# Patient Record
Sex: Male | Born: 1940 | Race: White | Hispanic: No | Marital: Married | Smoking: Former smoker
Health system: Southern US, Community
[De-identification: ages and names within clinical notes are randomized; demographics above are authoritative.]

## PROBLEM LIST (undated history)

## (undated) DIAGNOSIS — K08109 Complete loss of teeth, unspecified cause, unspecified class: Secondary | ICD-10-CM

## (undated) DIAGNOSIS — E785 Hyperlipidemia, unspecified: Secondary | ICD-10-CM

## (undated) DIAGNOSIS — Z972 Presence of dental prosthetic device (complete) (partial): Secondary | ICD-10-CM

## (undated) DIAGNOSIS — T8859XA Other complications of anesthesia, initial encounter: Secondary | ICD-10-CM

## (undated) DIAGNOSIS — R918 Other nonspecific abnormal finding of lung field: Principal | ICD-10-CM

## (undated) DIAGNOSIS — E119 Type 2 diabetes mellitus without complications: Secondary | ICD-10-CM

## (undated) DIAGNOSIS — T4145XA Adverse effect of unspecified anesthetic, initial encounter: Secondary | ICD-10-CM

## (undated) DIAGNOSIS — H9190 Unspecified hearing loss, unspecified ear: Secondary | ICD-10-CM

## (undated) DIAGNOSIS — C801 Malignant (primary) neoplasm, unspecified: Secondary | ICD-10-CM

## (undated) DIAGNOSIS — K219 Gastro-esophageal reflux disease without esophagitis: Secondary | ICD-10-CM

## (undated) DIAGNOSIS — Z923 Personal history of irradiation: Secondary | ICD-10-CM

## (undated) DIAGNOSIS — R0683 Snoring: Secondary | ICD-10-CM

## (undated) DIAGNOSIS — L4050 Arthropathic psoriasis, unspecified: Secondary | ICD-10-CM

## (undated) DIAGNOSIS — Z973 Presence of spectacles and contact lenses: Secondary | ICD-10-CM

## (undated) DIAGNOSIS — R1310 Dysphagia, unspecified: Secondary | ICD-10-CM

## (undated) DIAGNOSIS — M1990 Unspecified osteoarthritis, unspecified site: Secondary | ICD-10-CM

## (undated) HISTORY — DX: Personal history of irradiation: Z92.3

## (undated) HISTORY — DX: Hyperlipidemia, unspecified: E78.5

## (undated) HISTORY — DX: Dysphagia, unspecified: R13.10

## (undated) HISTORY — PX: COLONOSCOPY: SHX174

## (undated) HISTORY — DX: Other nonspecific abnormal finding of lung field: R91.8

---

## 2004-08-30 ENCOUNTER — Ambulatory Visit: Payer: Self-pay | Admitting: Family Medicine

## 2004-11-14 ENCOUNTER — Emergency Department (HOSPITAL_COMMUNITY): Admission: EM | Admit: 2004-11-14 | Discharge: 2004-11-14 | Payer: Self-pay | Admitting: Emergency Medicine

## 2004-11-14 ENCOUNTER — Ambulatory Visit: Payer: Self-pay | Admitting: Family Medicine

## 2005-01-04 ENCOUNTER — Emergency Department (HOSPITAL_COMMUNITY): Admission: EM | Admit: 2005-01-04 | Discharge: 2005-01-04 | Payer: Self-pay | Admitting: Emergency Medicine

## 2005-01-05 ENCOUNTER — Ambulatory Visit: Payer: Self-pay | Admitting: Family Medicine

## 2008-04-09 ENCOUNTER — Emergency Department (HOSPITAL_COMMUNITY): Admission: EM | Admit: 2008-04-09 | Discharge: 2008-04-09 | Payer: Self-pay | Admitting: Emergency Medicine

## 2010-03-31 ENCOUNTER — Emergency Department (HOSPITAL_COMMUNITY)
Admission: EM | Admit: 2010-03-31 | Discharge: 2010-03-31 | Payer: Self-pay | Source: Home / Self Care | Admitting: Emergency Medicine

## 2011-02-03 LAB — DIFFERENTIAL
Basophils Relative: 0 % (ref 0–1)
Eosinophils Relative: 0 % (ref 0–5)
Lymphs Abs: 0.9 10*3/uL (ref 0.7–4.0)
Monocytes Relative: 9 % (ref 3–12)
Neutrophils Relative %: 85 % — ABNORMAL HIGH (ref 43–77)

## 2011-02-03 LAB — CBC
Hemoglobin: 15 g/dL (ref 13.0–17.0)
MCHC: 33.5 g/dL (ref 30.0–36.0)
Platelets: 280 10*3/uL (ref 150–400)
RBC: 4.82 MIL/uL (ref 4.22–5.81)
RDW: 12.8 % (ref 11.5–15.5)

## 2011-02-03 LAB — COMPREHENSIVE METABOLIC PANEL
AST: 20 U/L (ref 0–37)
Albumin: 3.7 g/dL (ref 3.5–5.2)
CO2: 25 mEq/L (ref 19–32)
GFR calc Af Amer: >60 mL/min (ref >60)
Glucose, Bld: 128 mg/dL — ABNORMAL HIGH (ref 70–99)
Potassium: 4.4 mEq/L (ref 3.5–5.1)
Sodium: 138 mEq/L (ref 135–145)
Total Bilirubin: 0.6 mg/dL (ref 0.3–1.2)

## 2011-02-03 LAB — URINALYSIS, ROUTINE W REFLEX MICROSCOPIC
Bilirubin Urine: NEGATIVE
Glucose, UA: NEGATIVE mg/dL
Ketones, ur: NEGATIVE mg/dL
Specific Gravity, Urine: 1.023 (ref 1.005–1.030)

## 2011-02-03 LAB — CK TOTAL AND CKMB (NOT AT ARMC)
CK, MB: 0.9 ng/mL (ref 0.3–4.0)
Total CK: 21 U/L (ref 7–232)

## 2011-07-06 ENCOUNTER — Emergency Department (HOSPITAL_COMMUNITY)
Admission: EM | Admit: 2011-07-06 | Discharge: 2011-07-06 | Disposition: A | Discharge status: Home / Self Care | Payer: Medicare Other | Attending: Emergency Medicine | Admitting: Emergency Medicine

## 2011-07-06 ENCOUNTER — Encounter (HOSPITAL_COMMUNITY): Payer: Self-pay | Admitting: Emergency Medicine

## 2011-07-06 ENCOUNTER — Emergency Department (HOSPITAL_COMMUNITY): Payer: Medicare Other

## 2011-07-06 DIAGNOSIS — M5137 Other intervertebral disc degeneration, lumbosacral region: Secondary | ICD-10-CM | POA: Insufficient documentation

## 2011-07-06 DIAGNOSIS — M51379 Other intervertebral disc degeneration, lumbosacral region without mention of lumbar back pain or lower extremity pain: Secondary | ICD-10-CM | POA: Insufficient documentation

## 2011-07-06 DIAGNOSIS — M5136 Other intervertebral disc degeneration, lumbar region: Secondary | ICD-10-CM

## 2011-07-06 DIAGNOSIS — Z79899 Other long term (current) drug therapy: Secondary | ICD-10-CM | POA: Insufficient documentation

## 2011-07-06 DIAGNOSIS — Z7982 Long term (current) use of aspirin: Secondary | ICD-10-CM | POA: Insufficient documentation

## 2011-07-06 DIAGNOSIS — M545 Low back pain, unspecified: Secondary | ICD-10-CM | POA: Insufficient documentation

## 2011-07-06 HISTORY — DX: Unspecified osteoarthritis, unspecified site: M19.90

## 2011-07-06 LAB — URINE MICROSCOPIC-ADD ON

## 2011-07-06 LAB — URINALYSIS, ROUTINE W REFLEX MICROSCOPIC
Glucose, UA: NEGATIVE mg/dL
Hgb urine dipstick: NEGATIVE
Nitrite: NEGATIVE
Protein, ur: NEGATIVE mg/dL
Specific Gravity, Urine: 1.022 (ref 1.005–1.030)
Urobilinogen, UA: 0.2 mg/dL (ref 0.0–1.0)
pH: 5.5 (ref 5.0–8.0)

## 2011-07-06 MED ORDER — OXYCODONE-ACETAMINOPHEN 5-325 MG PO TABS: 1.0000 | ORAL_TABLET | Freq: Four times a day (QID) | ORAL | Status: AC | PRN

## 2011-07-06 MED ORDER — ONDANSETRON 8 MG PO TBDP
8.0000 mg | ORAL_TABLET | Freq: Once | ORAL | Status: AC
  Administered 2011-07-06: 8 mg via ORAL
  Filled 2011-07-06: qty 1

## 2011-07-06 MED ORDER — MORPHINE SULFATE 4 MG/ML IJ SOLN: 4.0000 mg | Freq: Once | INTRAMUSCULAR | Status: DC

## 2011-07-06 MED ORDER — DIAZEPAM 5 MG PO TABS: 5.0000 mg | ORAL_TABLET | Freq: Three times a day (TID) | ORAL | Status: AC | PRN

## 2011-07-06 MED ORDER — PREDNISONE 50 MG PO TABS: 50.0000 mg | ORAL_TABLET | Freq: Every day | ORAL | Status: AC

## 2011-07-06 MED ORDER — KETOROLAC TROMETHAMINE 60 MG/2ML IM SOLN
60.0000 mg | Freq: Once | INTRAMUSCULAR | Status: AC
  Administered 2011-07-06: 60 mg via INTRAMUSCULAR
  Filled 2011-07-06: qty 2

## 2011-07-06 NOTE — Discharge Instructions (Signed)
Followup with your primary care doctor in the next one to 2 days.  Return here for any worsening in your condition.  Use ice and heat on your lower back.

## 2011-07-06 NOTE — ED Provider Notes (Signed)
History     CSN: [id number]  Arrival date & time 07/06/11  0900   First MD Initiated Contact with Patient 07/06/11 [id number]      No chief complaint on file.   (Consider location/radiation/quality/duration/timing/severity/associated sxs/prior treatment) HPI Patient since the emergency department with low back pain started Sunday morning after taking a shower.  He states that he has had back pain previously in the past.  He states that he has pain with walking but is able to ambulate without any difficulties otherwise.  Patient states that he has taken some oxycodone that he had but did not seem to help totally.  The patient states that he does not have any numbness, weakness, nausea/vomiting, abdominal pain, chest pain, shortness of breath, fever, or urinary symptoms.  States the pain does not necessarily get worse except with movement.  Patient denies the pain is worse in the evening or night.  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 Past Medical History  Diagnosis Date  . Arthritis     History reviewed. No pertinent past surgical history.  No family history on file.  History  Substance Use Topics  . Smoking status: Current Everyday Smoker  . Smokeless tobacco: Not on file  . Alcohol Use: Yes      Review of Systems All pertinent positives and negatives reviewed in the history of present illness  Allergies  Hydrocodone  Home Medications   Current Outpatient Rx  Name Route Sig Dispense Refill  . ASPIRIN EC 81 MG PO TBEC Oral Take 81 mg by mouth daily.    Marland Kitchen CALCIUM CARBONATE 600 MG PO TABS Oral Take 600 mg by mouth 2 (two) times daily with a meal.    . TYLENOL COLD RELIEF NIGHTTIME PO Oral Take 1 tablet by mouth at bedtime.    Marland Kitchen FOLIC ACID 400 MCG PO TABS Oral Take 400 mcg by mouth daily.    Marland Kitchen MAGNESIUM 250 MG PO TABS Oral Take 250 mg by mouth daily.    Marland Kitchen METHOTREXATE SODIUM 2.5 MG PO TABS Oral  Take 15 mg by mouth once a week.    . ADULT MULTIVITAMIN W/MINERALS CH Oral Take 1 tablet by mouth daily.    . OMEGA-3-ACID ETHYL ESTERS 1 G PO CAPS Oral Take 1 g by mouth 2 (two) times daily.    Marland Kitchen PANTOPRAZOLE SODIUM 40 MG PO TBEC Oral Take 40 mg by mouth daily.    Marland Kitchen PREDNISONE 5 MG PO TABS Oral Take 5-10 mg by mouth daily.      BP 124/76  Pulse 80  Temp(Src) 97.9 F (36.6 C) (Oral)  Resp 20  SpO2 100%  Physical Exam  Constitutional: He is oriented to person, place, and time. He appears well-developed and well-nourished. No distress.  HENT:  Head: Normocephalic and atraumatic.  Eyes: Pupils are equal, round, and reactive to light.  Neck: Normal range of motion. Neck supple.  Cardiovascular: Normal rate, regular rhythm and normal heart sounds.  Exam reveals no gallop and no friction rub.   No murmur heard. Pulmonary/Chest: Effort normal and breath sounds normal. No respiratory distress. He has no wheezes. He has no rales.  Abdominal: Soft. Bowel sounds are normal. He exhibits no distension. There is no tenderness.  Neurological: He is alert and oriented to person, place, and time. He has normal strength. He is not disoriented. No sensory deficit. Coordination and gait normal.  Reflex  Scores:      Patellar reflexes are 2+ on the right side and 2+ on the left side.      Achilles reflexes are 2+ on the right side and 2+ on the left side. Skin: Skin is warm and dry. No rash noted.    ED Course  Procedures (including critical care time)   Labs Reviewed  URINALYSIS, ROUTINE W REFLEX MICROSCOPIC   Dg Lumbar Spine Complete  07/06/2011  *RADIOLOGY REPORT*  Clinical Data: 71 year old male with low back pain.  LUMBAR SPINE - COMPLETE 4+ VIEW  Comparison: None.  Findings: Bilateral nephrolithiasis, greater on the right.  Several oblong hyperdensities are probably enteric contents rather than a larger renal stones given their position on the oblique views.  Normal lumbar segmentation. Bone  mineralization is within normal limits.  Straightening of lordosis, otherwise normal vertebral height and alignment.  Moderate to severe disc space loss at L5-S1. Other disc spaces are relatively preserved.  There is degenerative endplate spurring at L4-L5.  No pars fracture.  Mild bilateral L5- L1 facet hypertrophy.  Negative visualized sacrum and SI joints.  IMPRESSION: Chronic severe L5-S1 disc degeneration.  Nephrolithiasis.  Original Report Authenticated By: Harley Hallmark, M.D.    The patient has been seen by the attending Physician.  Patient has been here an extended time to give [place] a urine sample.  Patient states that he is not having bowel or bladder issues and is physically not able to urinate as he does not have to go to the bathroom.  Patient has x-ray findings are showing severe L5-S1 disc degeneration and this would be consistent with the location of his pain.  Patient does have a primary care doctor and advised him he will need to followup in recheck with the next few days.  Patient does not have any neurological deficits or abnormal reflexes.  The patient is able to ambulate down the hall and he is not showing any signs of gait disturbance.    MDM  MDM Reviewed: vitals and nursing note Interpretation: x-ray            Carlyle Dolly, PA-C 07/07/11 [phone]

## 2011-07-06 NOTE — ED Notes (Signed)
Pt with hx of back problems but it has been 25 years since last episode. Pt reports pain started Sunday morning after getting out of bathroom. Pt reports pain lower/center right. Denies radiation to right leg or foot or bowel or bladder problems. Pt also denies fall or injury.

## 2011-07-06 NOTE — ED Notes (Signed)
Patient transported to X-ray 

## 2011-07-07 NOTE — ED Provider Notes (Signed)
Medical screening examination/treatment/procedure(s) were performed by non-physician practitioner and as supervising physician I was immediately available for consultation/collaboration.    Nelia Shi, MD 07/07/11 [phone]

## 2011-07-08 LAB — URINE CULTURE
Colony Count: NO GROWTH
Culture  Setup Time: 201303080340
Culture: NO GROWTH

## 2011-07-11 ENCOUNTER — Other Ambulatory Visit (HOSPITAL_COMMUNITY): Payer: Self-pay | Admitting: Family Medicine

## 2011-07-11 ENCOUNTER — Ambulatory Visit (HOSPITAL_COMMUNITY)
Admission: RE | Admit: 2011-07-11 | Discharge: 2011-07-11 | Disposition: A | Discharge status: Home / Self Care | Payer: Medicare Other | Source: Ambulatory Visit | Attending: Family Medicine | Admitting: Family Medicine

## 2011-07-11 DIAGNOSIS — M545 Low back pain, unspecified: Secondary | ICD-10-CM | POA: Insufficient documentation

## 2011-07-11 DIAGNOSIS — M549 Dorsalgia, unspecified: Secondary | ICD-10-CM

## 2011-07-11 DIAGNOSIS — M5126 Other intervertebral disc displacement, lumbar region: Secondary | ICD-10-CM | POA: Insufficient documentation

## 2011-07-11 DIAGNOSIS — Q762 Congenital spondylolisthesis: Secondary | ICD-10-CM | POA: Insufficient documentation

## 2011-07-11 DIAGNOSIS — M5416 Radiculopathy, lumbar region: Secondary | ICD-10-CM

## 2011-07-11 DIAGNOSIS — M47817 Spondylosis without myelopathy or radiculopathy, lumbosacral region: Secondary | ICD-10-CM | POA: Insufficient documentation

## 2011-07-11 DIAGNOSIS — IMO0002 Reserved for concepts with insufficient information to code with codable children: Secondary | ICD-10-CM | POA: Insufficient documentation

## 2011-07-13 ENCOUNTER — Inpatient Hospital Stay (HOSPITAL_COMMUNITY): Admission: RE | Admit: 2011-07-13 | Payer: Medicare Other | Source: Ambulatory Visit

## 2011-11-13 ENCOUNTER — Telehealth (INDEPENDENT_AMBULATORY_CARE_PROVIDER_SITE_OTHER): Payer: Self-pay

## 2011-11-13 NOTE — Telephone Encounter (Signed)
Patient mother Steva Ready) called in to report that Mark Howell has had an increase in abdominal pain and swelling in his scrotum for the last 2 day's.  Mother reports Mark Howell abdominal pain is severe at times, patient able to void without pain, denies any fever or nausea/vomiting.  Due to patient history of Diverticulitis with Perforation of his Colon, Mark Howell has been advised to go to the emergency room for further evaluation.

## 2011-12-20 ENCOUNTER — Ambulatory Visit (INDEPENDENT_AMBULATORY_CARE_PROVIDER_SITE_OTHER): Payer: Medicare Other | Admitting: General Surgery

## 2011-12-20 ENCOUNTER — Encounter (INDEPENDENT_AMBULATORY_CARE_PROVIDER_SITE_OTHER): Payer: Self-pay | Admitting: General Surgery

## 2011-12-20 VITALS — BP 158/72 | HR 72 | Temp 97.3°F | Resp 16 | Ht 73.5 in | Wt 181.2 lb

## 2011-12-20 DIAGNOSIS — K4090 Unilateral inguinal hernia, without obstruction or gangrene, not specified as recurrent: Secondary | ICD-10-CM

## 2011-12-20 MED ORDER — TAMSULOSIN HCL 0.4 MG PO CAPS: 0.4000 mg | ORAL_CAPSULE | Freq: Every day | ORAL | Status: DC

## 2011-12-20 NOTE — Patient Instructions (Addendum)
Pick-up your Flomax prescription at your pharmacy. Start taking the flomax 2 days before your surgery

## 2011-12-21 ENCOUNTER — Encounter (HOSPITAL_COMMUNITY): Payer: Self-pay | Admitting: Pharmacy Technician

## 2011-12-21 NOTE — Progress Notes (Signed)
Patient ID: Mark Howell, male   DOB: 10/24/1940, 70 y.o.   MRN: [id number]  Chief Complaint  Patient presents with  . Pre-op Exam    RIH    HPI Mark Howell is a 70 y.o. male.   HPI 70-year-old Caucasian male referred by Dr. Nyland for evaluation of a right inguinal hernia. The patient states that he started having problems about 2 months ago. Initially noted some intermittent discomfort. Now the discomfort is now on a daily basis. It tends to bother him more after he showers. He describes it as a burning sensation. He describes it as if somebody is pouring alcohol on an open wound. He does notice a bulge. Generally the discomfort and the pain tends to bother him later in the afternoon or early evening after he's been working all day. It currently doesn't bother him first thing in the morning when he gets out of bed. He denies any diarrhea or constipation. He denies any dysuria. He denies any nausea or vomiting Past Medical History  Diagnosis Date  . Arthritis     History reviewed. No pertinent past surgical history.  Family History  Problem Relation Age of Onset  . Stroke Father   . Diabetes Father   . Heart disease Father   . Cancer Brother     pancreatic    Social History History  Substance Use Topics  . Smoking status: Current Everyday Smoker -- 0.5 packs/day    Types: Cigarettes  . Smokeless tobacco: Never Used  . Alcohol Use: Yes    Allergies  Allergen Reactions  . Hydrocodone Rash    Current Outpatient Prescriptions  Medication Sig Dispense Refill  . aspirin EC 81 MG tablet Take 81 mg by mouth daily.      . calcium carbonate (OS-CAL) 600 MG TABS Take 600 mg by mouth daily.       . folic acid (FOLVITE) 400 MCG tablet Take 400 mcg by mouth daily.      . Magnesium 250 MG TABS Take 250 mg by mouth daily.      . Multiple Vitamin (MULITIVITAMIN WITH MINERALS) TABS Take 1 tablet by mouth daily.      . omega-3 acid ethyl esters (LOVAZA) 1 G capsule Take 1 g by mouth  daily.       . pantoprazole (PROTONIX) 40 MG tablet Take 40 mg by mouth daily.      . diphenhydramine-acetaminophen (TYLENOL PM) 25-500 MG TABS Take 1 tablet by mouth at bedtime as needed. For sleep      . methotrexate (RHEUMATREX) 2.5 MG tablet Take 15 mg by mouth every Friday. Caution:Chemotherapy. Protect from light.      . Pitavastatin Calcium (LIVALO) 4 MG TABS Take 2 mg by mouth daily. Take 1/2 tablet daily      . predniSONE (DELTASONE) 2.5 MG tablet Take 2.5 mg by mouth every other day. For arthritis      . Tamsulosin HCl (FLOMAX) 0.4 MG CAPS Take 1 capsule (0.4 mg total) by mouth daily. Start taking 2 days before your hernia surgery  10 capsule  0    Review of Systems Review of Systems  Constitutional: Negative for fever, chills, appetite change and unexpected weight change.  HENT: Negative for congestion and trouble swallowing.   Eyes: Negative for visual disturbance.  Respiratory: Negative for chest tightness and shortness of breath.   Cardiovascular: Negative for chest pain and leg swelling.       No PND, no orthopnea, no DOE    Gastrointestinal:       See HPI  Genitourinary: Negative for dysuria and hematuria.       +nocturia, trouble emptying, weak stream,   Musculoskeletal: Negative.   Skin: Negative for rash.  Neurological: Negative for seizures and speech difficulty.  Hematological: Does not bruise/bleed easily.  Psychiatric/Behavioral: Negative for behavioral problems and confusion.    Blood pressure 158/72, pulse 72, temperature 97.3 F (36.3 C), temperature source Temporal, resp. rate 16, height 6' 1.5" (1.867 m), weight 181 lb 3.2 oz (82.192 kg).  Physical Exam Physical Exam  Vitals reviewed. Constitutional: He appears well-developed and well-nourished. No distress.  HENT:  Head: Normocephalic and atraumatic.  Right Ear: External ear normal.  Left Ear: External ear normal.       glasses  Eyes: Conjunctivae are normal. No scleral icterus.  Neck: Normal range  of motion. Neck supple. No tracheal deviation present. No thyromegaly present.  Cardiovascular: Normal rate, regular rhythm and normal heart sounds.   Pulmonary/Chest: Effort normal and breath sounds normal. No stridor. No respiratory distress. He has no wheezes.  Abdominal: Soft. Bowel sounds are normal. He exhibits no distension. There is no tenderness. A hernia is present. Hernia confirmed positive in the right inguinal area (reducible). Hernia confirmed negative in the left inguinal area.  Genitourinary: Testes normal and penis normal.  Musculoskeletal: Normal range of motion. He exhibits no edema.  Neurological: He is alert. He exhibits normal muscle tone.  Skin: Skin is warm and dry. No rash noted. He is not diaphoretic. No erythema. No pallor.  Psychiatric: He has a normal mood and affect. His behavior is normal. Judgment and thought content normal.    Data Reviewed Dr Nylands note  Assessment    Right inguinal hernia    Plan    We discussed the etiology of inguinal hernias. We discussed the signs & symptoms of incarceration & strangulation.  We discussed non-operative and operative management. We discussed both open and laparoscopic approaches. I recommended a laparoscopic approach mainly because of the smaller incisions and less likely to develop a wound infection given his prednisone and tobacco usage.   The patient has elected to proceed with LAPAROSCOPIC REPAIR OF RIGHT INGUINAL HERNIA WITH MESH    I described the procedure in detail.  The patient was given educational material. We discussed the risks and benefits including but not limited to bleeding, infection, chronic inguinal pain, nerve entrapment, hernia recurrence, mesh complications, hematoma formation, urinary retention, injury to the testicles, numbness in the groin, blood clots, injury to the surrounding structures, and anesthesia risk. We also discussed the typical post operative recovery course, including no heavy  lifting for 4-6 weeks. I explained that the likelihood of improvement of their symptoms is good.   We will place him on perioperative flomax to help decrease his risk of urinary retention since he already has some voiding issues. I explained that he was at higher risk for complications (including recurrence) given his prednisone and tobacco use.  Densel Kronick M. Anes Rigel, MD, FACS General, Bariatric, & Minimally Invasive Surgery [facility]         Anniya Whiters M 12/21/2011, 5:33 PM    

## 2011-12-26 NOTE — Progress Notes (Signed)
12-26-11 1015 Dr. Andrey Campanile- Pt. Need MD order entry into Epic for sch. PST appt. 12-28-11 0945 AM [facility]). W. Kennon Portela

## 2011-12-28 ENCOUNTER — Encounter (HOSPITAL_COMMUNITY): Payer: Self-pay

## 2011-12-28 ENCOUNTER — Other Ambulatory Visit (INDEPENDENT_AMBULATORY_CARE_PROVIDER_SITE_OTHER): Payer: Self-pay | Admitting: General Surgery

## 2011-12-28 ENCOUNTER — Encounter (HOSPITAL_COMMUNITY)
Admission: RE | Admit: 2011-12-28 | Discharge: 2011-12-28 | Disposition: A | Discharge status: Home / Self Care | Payer: Medicare Other | Source: Ambulatory Visit | Attending: General Surgery | Admitting: General Surgery

## 2011-12-28 HISTORY — DX: Gastro-esophageal reflux disease without esophagitis: K21.9

## 2011-12-28 LAB — CBC
HCT: 49.4 % (ref 39.0–52.0)
MCV: 94.1 fL (ref 78.0–100.0)
RDW: 13.4 % (ref 11.5–15.5)
WBC: 8.4 10*3/uL (ref 4.0–10.5)

## 2011-12-28 NOTE — Progress Notes (Signed)
Faxed STOP-BANG tool results for Sleep Apnea to Dr. Joette Catching and placed on chart.

## 2011-12-28 NOTE — Progress Notes (Signed)
12/28/11 1337  OBSTRUCTIVE SLEEP APNEA  Have you ever been diagnosed with sleep apnea through a sleep study? No  Do you snore loudly (loud enough to be heard through closed doors)?  1  Do you often feel tired, fatigued, or sleepy during the daytime? 0  Has anyone observed you stop breathing during your sleep? 1  Do you have, or are you being treated for high blood pressure? 0  BMI more than 35 kg/m2? 0  Age over 72 years old? 1  Neck circumference greater than 40 cm/18 inches? 0  Gender: 1  Obstructive Sleep Apnea Score 4   Score 4 or greater  Updated health history

## 2011-12-28 NOTE — Patient Instructions (Signed)
20 Mark Howell  12/28/2011   Your procedure is scheduled on:  Thursday Sept. 5,2013 at 0730 am   Report to [facility] at 0530 AM.  Call this number if you have problems the morning of surgery: [phone]   Remember:   Do not eat food:After Midnight.  May have clear liquids:until Midnight .    Take these medicines the morning of surgery with A SIP OF WATER: Protonix, Prednisone,Flomax   Do not wear jewelry  Do not wear lotions, powders, or perfumes.   Do not shave 48 hours prior to surgery. Men may shave face and neck.  Do not bring valuables to the hospital.  Contacts, dentures or bridgework may not be worn into surgery.  Leave suitcase in the car. After surgery it may be brought to your room.  For patients admitted to the hospital, checkout time is 11:00 AM the day of discharge.   Patients discharged the day of surgery will not be allowed to drive home.  Name and phone number of your driver: [name] [phone]  Special Instructions: CHG Shower Use Special Wash: 1/2 bottle night before surgery and 1/2 bottle morning of surgery.   Please read over the following fact sheets that you were given: MRSA Information, Sleep apnea sheet, Incentive Spirometry sheet                Any questions, please call me at [phone] [facility].Georgeanna Lea, RN,BSN

## 2012-01-04 ENCOUNTER — Ambulatory Visit (HOSPITAL_COMMUNITY): Payer: Medicare Other | Admitting: Anesthesiology

## 2012-01-04 ENCOUNTER — Encounter (HOSPITAL_COMMUNITY): Payer: Self-pay | Admitting: Anesthesiology

## 2012-01-04 ENCOUNTER — Encounter (HOSPITAL_COMMUNITY): Payer: Self-pay | Admitting: *Deleted

## 2012-01-04 ENCOUNTER — Ambulatory Visit (HOSPITAL_COMMUNITY)
Admission: RE | Admit: 2012-01-04 | Discharge: 2012-01-04 | Disposition: A | Discharge status: Home / Self Care | Payer: Medicare Other | Source: Ambulatory Visit | Attending: General Surgery | Admitting: General Surgery

## 2012-01-04 ENCOUNTER — Encounter (HOSPITAL_COMMUNITY)
Admission: RE | Disposition: A | Discharge status: Home / Self Care | Payer: Self-pay | Source: Ambulatory Visit | Attending: General Surgery

## 2012-01-04 DIAGNOSIS — Z79899 Other long term (current) drug therapy: Secondary | ICD-10-CM | POA: Insufficient documentation

## 2012-01-04 DIAGNOSIS — F17200 Nicotine dependence, unspecified, uncomplicated: Secondary | ICD-10-CM | POA: Insufficient documentation

## 2012-01-04 DIAGNOSIS — Z7982 Long term (current) use of aspirin: Secondary | ICD-10-CM | POA: Insufficient documentation

## 2012-01-04 DIAGNOSIS — K4090 Unilateral inguinal hernia, without obstruction or gangrene, not specified as recurrent: Secondary | ICD-10-CM | POA: Insufficient documentation

## 2012-01-04 DIAGNOSIS — Z01812 Encounter for preprocedural laboratory examination: Secondary | ICD-10-CM | POA: Insufficient documentation

## 2012-01-04 HISTORY — PX: INGUINAL HERNIA REPAIR: SHX194

## 2012-01-04 SURGERY — REPAIR, HERNIA, INGUINAL, LAPAROSCOPIC
Anesthesia: General | Laterality: Right | Wound class: Clean

## 2012-01-04 MED ORDER — SUFENTANIL CITRATE 50 MCG/ML IV SOLN
INTRAVENOUS | Status: DC | PRN
  Administered 2012-01-04 (×2): 5 ug via INTRAVENOUS
  Administered 2012-01-04: 20 ug via INTRAVENOUS
  Administered 2012-01-04 (×2): 5 ug via INTRAVENOUS
  Administered 2012-01-04: 10 ug via INTRAVENOUS

## 2012-01-04 MED ORDER — SODIUM CHLORIDE 0.9 % IJ SOLN: 3.0000 mL | INTRAMUSCULAR | Status: DC | PRN

## 2012-01-04 MED ORDER — CISATRACURIUM BESYLATE (PF) 10 MG/5ML IV SOLN
INTRAVENOUS | Status: DC | PRN
  Administered 2012-01-04: 10 mg via INTRAVENOUS
  Administered 2012-01-04: 2 mg via INTRAVENOUS

## 2012-01-04 MED ORDER — ACETAMINOPHEN 650 MG RE SUPP
650.0000 mg | RECTAL | Status: DC | PRN
  Filled 2012-01-04: qty 1

## 2012-01-04 MED ORDER — SODIUM CHLORIDE 0.9 % IV SOLN: 250.0000 mL | INTRAVENOUS | Status: DC | PRN

## 2012-01-04 MED ORDER — BUPIVACAINE-EPINEPHRINE 0.25% -1:200000 IJ SOLN
INTRAMUSCULAR | Status: DC | PRN
  Administered 2012-01-04: 30 mL

## 2012-01-04 MED ORDER — LACTATED RINGERS IV SOLN: INTRAVENOUS | Status: DC

## 2012-01-04 MED ORDER — PROMETHAZINE HCL 25 MG/ML IJ SOLN: 6.2500 mg | INTRAMUSCULAR | Status: DC | PRN

## 2012-01-04 MED ORDER — OXYCODONE HCL 5 MG PO TABS
5.0000 mg | ORAL_TABLET | ORAL | Status: DC | PRN
  Administered 2012-01-04 (×2): 5 mg via ORAL
  Filled 2012-01-04 (×2): qty 1

## 2012-01-04 MED ORDER — OXYCODONE-ACETAMINOPHEN 5-325 MG PO TABS
1.0000 | ORAL_TABLET | ORAL | Status: DC | PRN
Start: 2012-01-04 — End: 2012-01-10

## 2012-01-04 MED ORDER — EPHEDRINE SULFATE 50 MG/ML IJ SOLN
INTRAMUSCULAR | Status: DC | PRN
  Administered 2012-01-04: 10 mg via INTRAVENOUS

## 2012-01-04 MED ORDER — SODIUM CHLORIDE 0.9 % IR SOLN
Status: DC | PRN
  Administered 2012-01-04: 1000 mL

## 2012-01-04 MED ORDER — BUPIVACAINE-EPINEPHRINE PF 0.25-1:200000 % IJ SOLN
INTRAMUSCULAR | Status: AC
  Filled 2012-01-04: qty 30

## 2012-01-04 MED ORDER — ACETAMINOPHEN 325 MG PO TABS: 650.0000 mg | ORAL_TABLET | ORAL | Status: DC | PRN

## 2012-01-04 MED ORDER — FENTANYL CITRATE 0.05 MG/ML IJ SOLN
25.0000 ug | INTRAMUSCULAR | Status: DC | PRN
  Administered 2012-01-04: 50 ug via INTRAVENOUS

## 2012-01-04 MED ORDER — GLYCOPYRROLATE 0.2 MG/ML IJ SOLN
INTRAMUSCULAR | Status: DC | PRN
  Administered 2012-01-04: .6 mg via INTRAVENOUS

## 2012-01-04 MED ORDER — LACTATED RINGERS IV SOLN
INTRAVENOUS | Status: DC | PRN
  Administered 2012-01-04: 07:00:00 via INTRAVENOUS

## 2012-01-04 MED ORDER — ACETAMINOPHEN 10 MG/ML IV SOLN
INTRAVENOUS | Status: DC | PRN
  Administered 2012-01-04: 1000 mg via INTRAVENOUS

## 2012-01-04 MED ORDER — MORPHINE SULFATE 10 MG/ML IJ SOLN: 1.0000 mg | INTRAMUSCULAR | Status: DC | PRN

## 2012-01-04 MED ORDER — ONDANSETRON HCL 4 MG/2ML IJ SOLN
4.0000 mg | Freq: Four times a day (QID) | INTRAMUSCULAR | Status: DC | PRN
  Administered 2012-01-04: 4 mg via INTRAVENOUS
  Filled 2012-01-04: qty 2

## 2012-01-04 MED ORDER — NEOSTIGMINE METHYLSULFATE 1 MG/ML IJ SOLN
INTRAMUSCULAR | Status: DC | PRN
  Administered 2012-01-04: 4 mg via INTRAVENOUS

## 2012-01-04 MED ORDER — PROPOFOL 10 MG/ML IV BOLUS
INTRAVENOUS | Status: DC | PRN
  Administered 2012-01-04: 200 mg via INTRAVENOUS

## 2012-01-04 MED ORDER — LIDOCAINE HCL (CARDIAC) 20 MG/ML IV SOLN
INTRAVENOUS | Status: DC | PRN
  Administered 2012-01-04: 25 mg via INTRAVENOUS

## 2012-01-04 MED ORDER — ONDANSETRON HCL 4 MG/2ML IJ SOLN
INTRAMUSCULAR | Status: DC | PRN
  Administered 2012-01-04: 4 mg via INTRAVENOUS

## 2012-01-04 MED ORDER — LIDOCAINE HCL 4 % MT SOLN
OROMUCOSAL | Status: DC | PRN
  Administered 2012-01-04: 4 mL via TOPICAL

## 2012-01-04 MED ORDER — VANCOMYCIN HCL IN DEXTROSE 1-5 GM/200ML-% IV SOLN
1000.0000 mg | INTRAVENOUS | Status: AC
  Administered 2012-01-04: 1000 mg via INTRAVENOUS

## 2012-01-04 MED ORDER — SODIUM CHLORIDE 0.9 % IJ SOLN: 3.0000 mL | Freq: Two times a day (BID) | INTRAMUSCULAR | Status: DC

## 2012-01-04 MED ORDER — DEXAMETHASONE SODIUM PHOSPHATE 10 MG/ML IJ SOLN
INTRAMUSCULAR | Status: DC | PRN
  Administered 2012-01-04: 10 mg via INTRAVENOUS

## 2012-01-04 MED ORDER — FENTANYL CITRATE 0.05 MG/ML IJ SOLN
INTRAMUSCULAR | Status: AC
  Filled 2012-01-04: qty 2

## 2012-01-04 MED ORDER — VANCOMYCIN HCL IN DEXTROSE 1-5 GM/200ML-% IV SOLN
INTRAVENOUS | Status: AC
  Filled 2012-01-04: qty 200

## 2012-01-04 MED ORDER — ACETAMINOPHEN 10 MG/ML IV SOLN
INTRAVENOUS | Status: AC
  Filled 2012-01-04: qty 100

## 2012-01-04 SURGICAL SUPPLY — 39 items
ADH SKN CLS APL DERMABOND .7 (GAUZE/BANDAGES/DRESSINGS) ×1
APL SKNCLS STERI-STRIP NONHPOA (GAUZE/BANDAGES/DRESSINGS)
BANDAGE ADHESIVE 1X3 (GAUZE/BANDAGES/DRESSINGS) IMPLANT
BENZOIN TINCTURE PRP APPL 2/3 (GAUZE/BANDAGES/DRESSINGS) IMPLANT
CANISTER SUCTION 2500CC (MISCELLANEOUS) ×2 IMPLANT
CLOTH BEACON ORANGE TIMEOUT ST (SAFETY) ×2 IMPLANT
DECANTER SPIKE VIAL GLASS SM (MISCELLANEOUS) ×2 IMPLANT
DERMABOND ADVANCED (GAUZE/BANDAGES/DRESSINGS) ×1
DERMABOND ADVANCED .7 DNX12 (GAUZE/BANDAGES/DRESSINGS) IMPLANT
DEVICE SECURE STRAP 25 ABSORB (INSTRUMENTS) ×3 IMPLANT
DRAPE LAPAROSCOPIC ABDOMINAL (DRAPES) ×2 IMPLANT
DRAPE UTILITY XL STRL (DRAPES) ×2 IMPLANT
DRSG TEGADERM 2-3/8X2-3/4 SM (GAUZE/BANDAGES/DRESSINGS) IMPLANT
DRSG TEGADERM 4X4.75 (GAUZE/BANDAGES/DRESSINGS) IMPLANT
ELECT REM PT RETURN 9FT ADLT (ELECTROSURGICAL) ×2
ELECTRODE REM PT RTRN 9FT ADLT (ELECTROSURGICAL) ×1 IMPLANT
GLOVE BIO SURGEON STRL SZ7.5 (GLOVE) ×2 IMPLANT
GLOVE BIOGEL M STRL SZ7.5 (GLOVE) ×2 IMPLANT
GLOVE BIOGEL PI IND STRL 7.0 (GLOVE) IMPLANT
GLOVE BIOGEL PI INDICATOR 7.0 (GLOVE) ×2
GLOVE INDICATOR 8.0 STRL GRN (GLOVE) ×2 IMPLANT
GLOVE SURG SS PI 6.5 STRL IVOR (GLOVE) ×2 IMPLANT
GOWN STRL NON-REIN LRG LVL3 (GOWN DISPOSABLE) ×2 IMPLANT
GOWN STRL REIN XL XLG (GOWN DISPOSABLE) ×4 IMPLANT
KIT BASIN OR (CUSTOM PROCEDURE TRAY) ×2 IMPLANT
MESH ULTRAPRO 3X6 7.6X15CM (Mesh General) ×2 IMPLANT
NS IRRIG 1000ML POUR BTL (IV SOLUTION) ×2 IMPLANT
SCISSORS LAP 5X35 DISP (ENDOMECHANICALS) ×1 IMPLANT
SET IRRIG TUBING LAPAROSCOPIC (IRRIGATION / IRRIGATOR) ×1 IMPLANT
SHEARS CURVED HARMONIC AC 45CM (MISCELLANEOUS) IMPLANT
SOLUTION ANTI FOG 6CC (MISCELLANEOUS) ×2 IMPLANT
STRIP CLOSURE SKIN 1/2X4 (GAUZE/BANDAGES/DRESSINGS) IMPLANT
SUT MNCRL AB 4-0 PS2 18 (SUTURE) ×2 IMPLANT
SUT VICRYL 0 UR6 27IN ABS (SUTURE) ×2 IMPLANT
TOWEL OR 17X26 10 PK STRL BLUE (TOWEL DISPOSABLE) ×2 IMPLANT
TRAY LAP CHOLE (CUSTOM PROCEDURE TRAY) ×2 IMPLANT
TROCAR BLADELESS OPT 5 75 (ENDOMECHANICALS) ×4 IMPLANT
TROCAR XCEL BLUNT TIP 100MML (ENDOMECHANICALS) ×2 IMPLANT
TUBING INSUFFLATION 10FT LAP (TUBING) ×2 IMPLANT

## 2012-01-04 NOTE — Anesthesia Postprocedure Evaluation (Signed)
  Anesthesia Post-op Note  Patient: Mark Howell  Procedure(s) Performed: Procedure(s) (LRB): LAPAROSCOPIC INGUINAL HERNIA (Right) INSERTION OF MESH (Right)  Patient Location: PACU  Anesthesia Type: General  Level of Consciousness: awake and alert   Airway and Oxygen Therapy: Patient Spontanous Breathing  Post-op Pain: mild  Post-op Assessment: Post-op Vital signs reviewed, Patient's Cardiovascular Status Stable, Respiratory Function Stable, Patent Airway and No signs of Nausea or vomiting  Post-op Vital Signs: stable  Complications: No apparent anesthesia complications

## 2012-01-04 NOTE — Preoperative (Signed)
Beta Blockers   Reason not to administer Beta Blockers:Not Applicable 

## 2012-01-04 NOTE — Anesthesia Procedure Notes (Signed)
Procedure Name: Intubation Date/Time: 01/04/2012 7:45 AM Performed by: Leroy Libman L Patient Re-evaluated:Patient Re-evaluated prior to inductionOxygen Delivery Method: Circle system utilized Preoxygenation: Pre-oxygenation with 100% oxygen Intubation Type: IV induction Ventilation: Mask ventilation without difficulty and Oral airway inserted - appropriate to patient size Laryngoscope Size: Miller and 3 Grade View: Grade I Tube type: Oral Tube size: 8.0 mm Number of attempts: 1 Airway Equipment and Method: Stylet and LTA kit utilized Placement Confirmation: ETT inserted through vocal cords under direct vision,  breath sounds checked- equal and bilateral and positive ETCO2 Secured at: 22 cm Tube secured with: Tape Dental Injury: Teeth and Oropharynx as per pre-operative assessment

## 2012-01-04 NOTE — Transfer of Care (Signed)
Immediate Anesthesia Transfer of Care Note  Patient: Mark Howell  Procedure(s) Performed: Procedure(s) (LRB) with comments: LAPAROSCOPIC INGUINAL HERNIA (Right) INSERTION OF MESH (Right)  Patient Location: PACU  Anesthesia Type: General  Level of Consciousness: awake, alert  and oriented  Airway & Oxygen Therapy: Patient Spontanous Breathing and Patient connected to face mask oxygen  Post-op Assessment: Report given to PACU RN and Post -op Vital signs reviewed and stable  Post vital signs: Reviewed and stable  Complications: No apparent anesthesia complications

## 2012-01-04 NOTE — H&P (View-Only) (Signed)
Patient ID: Mark Howell, male   DOB: 1940-08-17, 71 y.o.   MRN: [id number]  Chief Complaint  Patient presents with  . Pre-op Exam    RIH    HPI Mark Howell is a 71 y.o. male.   HPI 71 year old Caucasian male referred by Dr. Lysbeth Galas for evaluation of a right inguinal hernia. The patient states that he started having problems about 2 months ago. Initially noted some intermittent discomfort. Now the discomfort is now on a daily basis. It tends to bother him more after he showers. He describes it as a burning sensation. He describes it as if somebody is pouring alcohol on an open wound. He does notice a bulge. Generally the discomfort and the pain tends to bother him later in the afternoon or early evening after he's been working all day. It currently doesn't bother him first thing in the morning when he gets out of bed. He denies any diarrhea or constipation. He denies any dysuria. He denies any nausea or vomiting Past Medical History  Diagnosis Date  . Arthritis     History reviewed. No pertinent past surgical history.  Family History  Problem Relation Age of Onset  . Stroke Father   . Diabetes Father   . Heart disease Father   . Cancer Brother     pancreatic    Social History History  Substance Use Topics  . Smoking status: Current Everyday Smoker -- 0.5 packs/day    Types: Cigarettes  . Smokeless tobacco: Never Used  . Alcohol Use: Yes    Allergies  Allergen Reactions  . Hydrocodone Rash    Current Outpatient Prescriptions  Medication Sig Dispense Refill  . aspirin EC 81 MG tablet Take 81 mg by mouth daily.      . calcium carbonate (OS-CAL) 600 MG TABS Take 600 mg by mouth daily.       . folic acid (FOLVITE) 400 MCG tablet Take 400 mcg by mouth daily.      . Magnesium 250 MG TABS Take 250 mg by mouth daily.      . Multiple Vitamin (MULITIVITAMIN WITH MINERALS) TABS Take 1 tablet by mouth daily.      Marland Kitchen omega-3 acid ethyl esters (LOVAZA) 1 G capsule Take 1 g by mouth  daily.       . pantoprazole (PROTONIX) 40 MG tablet Take 40 mg by mouth daily.      . diphenhydramine-acetaminophen (TYLENOL PM) 25-500 MG TABS Take 1 tablet by mouth at bedtime as needed. For sleep      . methotrexate (RHEUMATREX) 2.5 MG tablet Take 15 mg by mouth every Friday. Caution:Chemotherapy. Protect from light.      . Pitavastatin Calcium (LIVALO) 4 MG TABS Take 2 mg by mouth daily. Take 1/2 tablet daily      . predniSONE (DELTASONE) 2.5 MG tablet Take 2.5 mg by mouth every other day. For arthritis      . Tamsulosin HCl (FLOMAX) 0.4 MG CAPS Take 1 capsule (0.4 mg total) by mouth daily. Start taking 2 days before your hernia surgery  10 capsule  0    Review of Systems Review of Systems  Constitutional: Negative for fever, chills, appetite change and unexpected weight change.  HENT: Negative for congestion and trouble swallowing.   Eyes: Negative for visual disturbance.  Respiratory: Negative for chest tightness and shortness of breath.   Cardiovascular: Negative for chest pain and leg swelling.       No PND, no orthopnea, no DOE  Gastrointestinal:       See HPI  Genitourinary: Negative for dysuria and hematuria.       +nocturia, trouble emptying, weak stream,   Musculoskeletal: Negative.   Skin: Negative for rash.  Neurological: Negative for seizures and speech difficulty.  Hematological: Does not bruise/bleed easily.  Psychiatric/Behavioral: Negative for behavioral problems and confusion.    Blood pressure 158/72, pulse 72, temperature 97.3 F (36.3 C), temperature source Temporal, resp. rate 16, height 6' 1.5" (1.867 m), weight 181 lb 3.2 oz (82.192 kg).  Physical Exam Physical Exam  Vitals reviewed. Constitutional: He appears well-developed and well-nourished. No distress.  HENT:  Head: Normocephalic and atraumatic.  Right Ear: External ear normal.  Left Ear: External ear normal.       glasses  Eyes: Conjunctivae are normal. No scleral icterus.  Neck: Normal range  of motion. Neck supple. No tracheal deviation present. No thyromegaly present.  Cardiovascular: Normal rate, regular rhythm and normal heart sounds.   Pulmonary/Chest: Effort normal and breath sounds normal. No stridor. No respiratory distress. He has no wheezes.  Abdominal: Soft. Bowel sounds are normal. He exhibits no distension. There is no tenderness. A hernia is present. Hernia confirmed positive in the right inguinal area (reducible). Hernia confirmed negative in the left inguinal area.  Genitourinary: Testes normal and penis normal.  Musculoskeletal: Normal range of motion. He exhibits no edema.  Neurological: He is alert. He exhibits normal muscle tone.  Skin: Skin is warm and dry. No rash noted. He is not diaphoretic. No erythema. No pallor.  Psychiatric: He has a normal mood and affect. His behavior is normal. Judgment and thought content normal.    Data Reviewed Dr Deitra Mayo note  Assessment    Right inguinal hernia    Plan    We discussed the etiology of inguinal hernias. We discussed the signs & symptoms of incarceration & strangulation.  We discussed non-operative and operative management. We discussed both open and laparoscopic approaches. I recommended a laparoscopic approach mainly because of the smaller incisions and less likely to develop a wound infection given his prednisone and tobacco usage.   The patient has elected to proceed with LAPAROSCOPIC REPAIR OF RIGHT INGUINAL HERNIA WITH MESH    I described the procedure in detail.  The patient was given educational material. We discussed the risks and benefits including but not limited to bleeding, infection, chronic inguinal pain, nerve entrapment, hernia recurrence, mesh complications, hematoma formation, urinary retention, injury to the testicles, numbness in the groin, blood clots, injury to the surrounding structures, and anesthesia risk. We also discussed the typical post operative recovery course, including no heavy  lifting for 4-6 weeks. I explained that the likelihood of improvement of their symptoms is good.   We will place him on perioperative flomax to help decrease his risk of urinary retention since he already has some voiding issues. I explained that he was at higher risk for complications (including recurrence) given his prednisone and tobacco use.  Mary Sella. Andrey Campanile, MD, FACS General, Bariatric, & Minimally Invasive Surgery [facility] [place]         [facility] M 12/21/2011, 5:33 PM

## 2012-01-04 NOTE — Anesthesia Preprocedure Evaluation (Addendum)
Anesthesia Evaluation  Patient identified by MRN, date of birth, ID band Patient awake    Reviewed: Allergy & Precautions, H&P , NPO status , Patient's Chart, lab work & pertinent test results  Airway Mallampati: II TM Distance: >3 FB Neck ROM: Full    Dental No notable dental hx. (+) Edentulous Upper and Edentulous Lower   Pulmonary Current Smoker,  breath sounds clear to auscultation  Pulmonary exam normal       Cardiovascular negative cardio ROS  Rhythm:Regular Rate:Normal     Neuro/Psych negative neurological ROS  negative psych ROS   GI/Hepatic Neg liver ROS, GERD-  Medicated,  Endo/Other  negative endocrine ROS  Renal/GU negative Renal ROS  negative genitourinary   Musculoskeletal  (+) Arthritis -, Osteoarthritis, Rheumatoid disorders and on steriods ,    Abdominal   Peds negative pediatric ROS (+)  Hematology negative hematology ROS (+)   Anesthesia Other Findings   Reproductive/Obstetrics negative OB ROS                          Anesthesia Physical Anesthesia Plan  ASA: II  Anesthesia Plan: General   Post-op Pain Management:    Induction: Intravenous  Airway Management Planned: Oral ETT  Additional Equipment:   Intra-op Plan:   Post-operative Plan: Extubation in OR  Informed Consent: I have reviewed the patients History and Physical, chart, labs and discussed the procedure including the risks, benefits and alternatives for the proposed anesthesia with the patient or authorized representative who has indicated his/her understanding and acceptance.   Dental advisory given  Plan Discussed with: CRNA  Anesthesia Plan Comments:         Anesthesia Quick Evaluation

## 2012-01-04 NOTE — Interval H&P Note (Signed)
History and Physical Interval Note:  01/04/2012 7:25 AM  Mark Howell  has presented today for surgery, with the diagnosis of Right inguinal hernia  The various methods of treatment have been discussed with the patient and family. After consideration of risks, benefits and other options for treatment, the patient has consented to  Procedure(s) (LRB) with comments: LAPAROSCOPIC INGUINAL HERNIA (Right) INSERTION OF MESH (Right) as a surgical intervention .  The patient's history has been reviewed, patient examined, no change in status, stable for surgery.  I have reviewed the patient's chart and labs.  Questions were answered to the patient's satisfaction.    Pain getting a little worse.  Mary Sella. Andrey Campanile, MD, FACS General, Bariatric, & Minimally Invasive Surgery [facility] Surgery, [place]   [facility]

## 2012-01-04 NOTE — Op Note (Signed)
01/04/2012  Mark Howell 16-Oct-1940   PREOPERATIVE DIAGNOSIS: right inguinal hernia.   POSTOPERATIVE DIAGNOSIS: right indirect inguinal hernia.   PROCEDURE: Laparoscopic repair of right indirect inguinal hernia with  mesh (TAPP).   SURGEON: Mary Sella. Andrey Campanile, MD FACS  ASSISTANT SURGEON: None.   ANESTHESIA: General plus local consisting of 0.25% Marcaine with epi.   ESTIMATED BLOOD LOSS: Minimal.   FINDINGS: The patient had a right indirect inguinal hernia.  It was repaired using a 3 inch x 6  inch piece of Ethicon UltraPro mesh.   SPECIMEN: none  INDICATIONS FOR PROCEDURE: 70yo WM with a symptomatic right inguinal hernia desiring repair. The risks and benefits including but not limited to bleeding, infection, chronic inguinal pain, nerve entrapment, hernia recurrence, mesh complications, hematoma formation, urinary retention, injury to the testicles or the ovaries, numbness in the groin, blood clots, injury to the surrounding structures, and anesthesia risk was discussed with the patient.  DESCRIPTION OF PROCEDURE: After obtaining verbal consent and marking  the right groin in the holding area with the patient confirming the  operative site, the patient was then taken back to the operating room, placed  supine on the operating room table. General endotracheal anesthesia was  established. The patient had emptied their bladder prior to going back to  the operating room. Sequential compression devices were placed. The  abdomen and groin were prepped and draped in the usual standard surgical  fashion with ChloraPrep. The patient received IV Tylenol as well as IV  antibiotics prior to the incision. A surgical time-out was performed.  Local was infiltrated at the base of the umbilicus.   Next, a 1-cm transverse infraumbilical incision was made with a #11 blade. The fascia  was grasped and lifted anteriorly. Next, the fascia was incised, and  the abdominal cavity was entered.  Pursestring suture was placed around  the fascial edges using a 0 Vicryl. A 12-mm Hasson trocar was placed.  Pneumoperitoneum was smoothly established up to a patient pressure of 15  mmHg. Laparoscope was advanced. There was no evidence of a  contralateral hernia. The patient had a defect lateral to  the inferior epigastric vessel, consistent with an right indirect  hernia. Two 5-mm trocars were placed, one on the right, one on the left  in the midclavicular line slightly above the level of the umbilicus all  under direct visualization. After local had been infiltrated, I then  made incision along the peritoneum on the right, starting 2 inches above  the anterior superior iliac spine and caring it medial  toward the median umbilical ligament in a lazy S configuration using  Endo Shears with electrocautery. The peritoneal flap was then gently  dissected downward from the anterior abdominal wall taking care not to  injure the inferior epigastric vessels. The pubic bone was identified.  The testicular vessels were identified.  Using  traction and counter traction with short graspers, I reduced the sac in  its entirety. The testicular vessels had been identified and preserved. The vas deferens was identified and preserved, and the hernia sac was stripped from those to  surrounding structures. I then went about creating a large pocket by  lifting the peritoneum of the pelvic floor. I took great care not to  injure the iliac vessels.  I then obtained a piece of Ethicon UltraPro mesh 3 inch x  6 inch, placed it through the Hasson trocar, half of it covered medial  to the inferior epigastric vessels and half of it  lateral to the  inferior epigastric vessels. The defect was well  covered with the mesh. I then secured the mesh to the abdominal wall  using an Ethicon secure strap tack. Tacks were placed through  the Cooper's ligament, one tack on each side of the inferior epigastric  vessel and two  tacks out laterally. No tacks were placed below the  shelving edge of the inguinal ligament. Pneumoperitoneum was reduced  to 8 mmHg. I then brought the peritoneal flap back up to the abdominal  wall and tacked it to the abdominal wall using 4 tacks. There was no  defect in the peritoneum, and the mesh was well covered. I removed the  Hasson trocar and tied down the previously placed pursestring suture.  The closure was viewed laparoscopically. There was no evidence of  fascial defect. There was no air leak at the umbilicus. However, i did place an additional figure of eight 0-vicryl suture at the umbilicus. There was no  evidence of injury to surrounding structures. Pneumoperitoneum was  released, and the remaining trocars were removed. All skin incisions  were closed with a 4-0 Monocryl in a subcuticular fashion followed by  application of Dermabond. All needle, instrument, and sponge counts  were correct x2. There are no immediate complications. The patient  tolerated the procedure well. The patient was extubated and taken to the  recovery room in stable condition.  Mary Sella. Andrey Campanile, MD, FACS General, Bariatric, & Minimally Invasive Surgery [facility] Surgery, [place]

## 2012-01-05 ENCOUNTER — Encounter (HOSPITAL_COMMUNITY): Payer: Self-pay | Admitting: General Surgery

## 2012-01-09 ENCOUNTER — Telehealth (INDEPENDENT_AMBULATORY_CARE_PROVIDER_SITE_OTHER): Payer: Self-pay | Admitting: General Surgery

## 2012-01-09 NOTE — Telephone Encounter (Signed)
Pt's wife called to request refill of pain med.  Per standing orders, called in Hydrocodone 5/325 mg, # 30, 1-2 po Q4-6H prn pain, no refill to [facility]:  [phone].

## 2012-01-10 ENCOUNTER — Telehealth (INDEPENDENT_AMBULATORY_CARE_PROVIDER_SITE_OTHER): Payer: Self-pay | Admitting: General Surgery

## 2012-01-10 MED ORDER — OXYCODONE-ACETAMINOPHEN 5-325 MG PO TABS: 1.0000 | ORAL_TABLET | ORAL | Status: AC | PRN

## 2012-01-10 NOTE — Telephone Encounter (Signed)
He can have percocet 5/325 1-2 tabs po q4hrs prn pain, #40. Will give rx to sara

## 2012-01-10 NOTE — Telephone Encounter (Signed)
Wife called yesterday for refill pain meds; pt is S/P surgery on 01/04/12 for lap RIH.  Called in Vicodin, but his is allergic to Hydrocodone.  He breaks out in hives.  Can he get another Rx of oxycodone?  Please advise.

## 2012-01-31 ENCOUNTER — Ambulatory Visit (INDEPENDENT_AMBULATORY_CARE_PROVIDER_SITE_OTHER): Payer: Medicare Other | Admitting: General Surgery

## 2012-01-31 ENCOUNTER — Encounter (INDEPENDENT_AMBULATORY_CARE_PROVIDER_SITE_OTHER): Payer: Self-pay | Admitting: General Surgery

## 2012-01-31 VITALS — BP 128/74 | HR 80 | Temp 97.2°F | Resp 16 | Ht 73.5 in | Wt 181.4 lb

## 2012-01-31 DIAGNOSIS — Z09 Encounter for follow-up examination after completed treatment for conditions other than malignant neoplasm: Secondary | ICD-10-CM

## 2012-01-31 NOTE — Progress Notes (Signed)
Subjective:     Patient ID: Mark Howell, male   DOB: [birth year], 71 y.o.   MRN: [id number]  HPI 71 year old Caucasian male comes in for his first postoperative appointment after undergoing laparoscopic repair of a right indirect inguinal hernia with mesh on September 5. He states that he had a fair amount of stinging and burning pain immediately after surgery. He would only last for a few seconds however. He states any type of activity such as bending over would trigger the discomfort.he still has the intermittent discomfort. However it is much less frequent. It is only with certain movements. He denies any fever, chills, nausea, vomiting, diarrhea or constipation. He denies any trouble urinating.  Review of Systems     Objective:   Physical Exam BP 128/74  Pulse 80  Temp 97.2 F (36.2 C) (Temporal)  Resp 16  Ht 6' 1.5" (1.867 m)  Wt 181 lb 6.4 oz (82.283 kg)  BMI 23.61 kg/m2  Gen: alert, NAD, non-toxic appearing Pulm: Lungs clear to auscultation, symmetric chest rise CV: regular rate and rhythm Abd: soft, nontender, nondistended. Well-healed trocar sites. No cellulitis. No incisional hernia GU: both testicles descended, no scrotal masses, no sign of hernia recurrence Ext: no edema     Assessment:     S/p Laparoscopic repair of right inguinal hernia with mesh    Plan:     There is no sign of hernia recurrence. There is no scrotal hematoma. I believe this is consistent with nerve irritation. Since he has shown improvement since surgery I believe he will have ongoing improvement and resolution of this intermittent discomfort. I encouraged him to avoid strenuous activity for another 2 weeks. Followup in 6-8 week  Mary Sella. Andrey Campanile, MD, FACS General, Bariatric, & Minimally Invasive Surgery [facility] Surgery, [place]

## 2012-01-31 NOTE — Patient Instructions (Signed)
-

## 2012-03-18 ENCOUNTER — Encounter: Payer: Self-pay | Admitting: *Deleted

## 2012-03-19 ENCOUNTER — Ambulatory Visit (INDEPENDENT_AMBULATORY_CARE_PROVIDER_SITE_OTHER): Payer: Medicare Other | Admitting: Pulmonary Disease

## 2012-03-19 ENCOUNTER — Encounter: Payer: Self-pay | Admitting: Pulmonary Disease

## 2012-03-19 VITALS — BP 140/90 | HR 81 | Temp 98.0°F | Ht 73.0 in | Wt 186.0 lb

## 2012-03-19 DIAGNOSIS — R0989 Other specified symptoms and signs involving the circulatory and respiratory systems: Secondary | ICD-10-CM

## 2012-03-19 DIAGNOSIS — R0683 Snoring: Secondary | ICD-10-CM | POA: Insufficient documentation

## 2012-03-19 NOTE — Assessment & Plan Note (Signed)
The pt's history is suggestive of apnea, however it is unclear if it is significant enough to require treatment.  He needs a sleep study for evaluation, and is a good candidate for home sleep testing.  I have had a long discussion about the pathophysiology of sleep apnea with him, including its impact to his CV health and QOL.  Will arrange for the sleep test, and also f/u once the results are available.

## 2012-03-19 NOTE — Progress Notes (Signed)
  Subjective:    Patient ID: Mark Howell, male    DOB: [birth year], 71 y.o.   MRN: [id number]  HPI The patient is a 71 year old male who I've been asked to see for possible obstructive sleep apnea.  He has been noted to have loud snoring during the night, as well as an abnormal breathing pattern during sleep by his wife.  She has also seen him have choking episodes, but he does not feel this is an issue.  He has 1-2 awakenings at night, typically to go to the bathroom.  He feels that he is about 50% of the time unrested upon arising.  He does have some sleep pressure during the day with periods of inactivity, and also occasionally has sleepiness in the evenings watching television.  He denies any sleepiness with driving.  The patient states that his weight has been neutral of the last few years, and his Epworth score today is normal at 8.  Sleep Questionnaire: What time do you typically go to bed?( Between what hours) 11:00pm How long does it take you to fall asleep? 10-15 minutes How many times during the night do you wake up? 2 What time do you get out of bed to start your day? 0600 Do you drive or operate heavy machinery in your occupation? No How much has your weight changed (up or down) over the past two years? (In pounds) 0 oz (0 kg) Have you ever had a sleep study before? No Do you currently use CPAP? No Do you wear oxygen at any time? No    Review of Systems  Constitutional: Negative for fever and unexpected weight change.  HENT: Positive for rhinorrhea. Negative for ear pain, nosebleeds, congestion, sore throat, sneezing, trouble swallowing, dental problem, postnasal drip and sinus pressure.   Eyes: Negative for redness and itching.  Respiratory: Negative for cough, chest tightness, shortness of breath and wheezing.   Cardiovascular: Negative for palpitations and leg swelling.  Gastrointestinal: Negative for nausea and vomiting.  Genitourinary: Negative for dysuria.  Musculoskeletal:  Positive for joint swelling.  Skin: Negative for rash.  Neurological: Negative for headaches.  Hematological: Does not bruise/bleed easily.  Psychiatric/Behavioral: Negative for dysphoric mood. The patient is not nervous/anxious.        Objective:   Physical Exam Constitutional:  Well developed, no acute distress  HENT:  Nares patent without discharge  Oropharynx without exudate, palate and uvula are thick and elongated.   Eyes:  Perrla, eomi, no scleral icterus  Neck:  No JVD, no TMG  Cardiovascular:  Normal rate, regular rhythm, no rubs or gallops.  No murmurs        Intact distal pulses  Pulmonary :  Normal breath sounds, no stridor or respiratory distress   No rales, rhonchi, or wheezing  Abdominal:  Soft, nondistended, bowel sounds present.  No tenderness noted.   Musculoskeletal:  No lower extremity edema noted.  Lymph Nodes:  No cervical lymphadenopathy noted  Skin:  No cyanosis noted  Neurologic:  Alert, appropriate, moves all 4 extremities without obvious deficit.         Assessment & Plan:

## 2012-03-19 NOTE — Patient Instructions (Addendum)
Will do home sleep testing to evaluate for sleep apnea Will discuss with you or arrange followup depending upon the results.

## 2012-03-21 ENCOUNTER — Encounter (INDEPENDENT_AMBULATORY_CARE_PROVIDER_SITE_OTHER): Payer: Self-pay | Admitting: General Surgery

## 2012-03-21 ENCOUNTER — Ambulatory Visit (INDEPENDENT_AMBULATORY_CARE_PROVIDER_SITE_OTHER): Payer: Medicare Other | Admitting: General Surgery

## 2012-03-21 VITALS — BP 130/76 | HR 76 | Temp 98.1°F | Resp 18 | Ht 73.0 in | Wt 186.0 lb

## 2012-03-21 DIAGNOSIS — Z09 Encounter for follow-up examination after completed treatment for conditions other than malignant neoplasm: Secondary | ICD-10-CM

## 2012-03-21 NOTE — Progress Notes (Signed)
Subjective:     Patient ID: Mark Howell, male   DOB: 08-01-1940, 70 y.o.   MRN: [id number]  HPI 71 year old Caucasian male comes in for followup after undergoing laparoscopic repair of a right indirect inguinal hernia with mesh on September 5. I last saw about a month ago. At that time he was having some stinging and burning in his groin. He states that he is doing excellent. He has resumed full activities. He denies any inguinal pain. He denies any trouble with nausea or vomiting or diarrhea or constipation. He denies any numbness, tingling, or burning in his groin  Review of Systems     Objective:   Physical Exam BP 130/76  Pulse 76  Temp 98.1 F (36.7 C) (Oral)  Resp 18  Ht 6\' 1"  (1.854 m)  Wt 186 lb (84.369 kg)  BMI 24.54 kg/m2  Gen: alert, NAD, non-toxic appearing Abd: soft, nontender, nondistended. Well-healed trocar sites. No cellulitis. No incisional hernia GU: both testicles descended, no scrotal masses, no sign of hernia recurrence Ext: no edema     Assessment:     Status post laparoscopic repair of right indirect inguinal hernia with mesh    Plan:     The patient is doing quite well. He has already resumed full activities. followup as needed  Mary Sella. Andrey Campanile, MD, FACS General, Bariatric, & Minimally Invasive Surgery [facility] Surgery, [place]

## 2012-03-21 NOTE — Patient Instructions (Signed)
Call if needed

## 2012-03-27 ENCOUNTER — Institutional Professional Consult (permissible substitution): Payer: Medicare Other | Admitting: Pulmonary Disease

## 2013-01-10 ENCOUNTER — Other Ambulatory Visit (HOSPITAL_COMMUNITY): Payer: Self-pay | Admitting: Rheumatology

## 2013-01-22 ENCOUNTER — Encounter (HOSPITAL_COMMUNITY): Payer: Self-pay | Admitting: Pharmacy Technician

## 2013-01-24 ENCOUNTER — Other Ambulatory Visit: Payer: Self-pay | Admitting: Radiology

## 2013-01-27 ENCOUNTER — Ambulatory Visit (HOSPITAL_COMMUNITY)
Admission: RE | Admit: 2013-01-27 | Discharge: 2013-01-27 | Disposition: A | Discharge status: Home / Self Care | Payer: Medicare Other | Source: Ambulatory Visit | Attending: Rheumatology | Admitting: Rheumatology

## 2013-01-27 ENCOUNTER — Encounter (HOSPITAL_COMMUNITY): Payer: Self-pay

## 2013-01-27 DIAGNOSIS — Z79899 Other long term (current) drug therapy: Secondary | ICD-10-CM | POA: Insufficient documentation

## 2013-01-27 DIAGNOSIS — K7689 Other specified diseases of liver: Secondary | ICD-10-CM | POA: Insufficient documentation

## 2013-01-27 DIAGNOSIS — R748 Abnormal levels of other serum enzymes: Secondary | ICD-10-CM | POA: Insufficient documentation

## 2013-01-27 LAB — CBC
MCH: 33.1 pg (ref 26.0–34.0)
MCHC: 35.4 g/dL (ref 30.0–36.0)
MCV: 93.3 fL (ref 78.0–100.0)
Platelets: 115 10*3/uL — ABNORMAL LOW (ref 150–400)
RDW: 14 % (ref 11.5–15.5)
WBC: 7.4 10*3/uL (ref 4.0–10.5)

## 2013-01-27 MED ORDER — MIDAZOLAM HCL 2 MG/2ML IJ SOLN
INTRAMUSCULAR | Status: DC | PRN
  Administered 2013-01-27: 1 mg via INTRAVENOUS

## 2013-01-27 MED ORDER — FENTANYL CITRATE 0.05 MG/ML IJ SOLN
INTRAMUSCULAR | Status: DC | PRN
  Administered 2013-01-27: 50 ug via INTRAVENOUS

## 2013-01-27 MED ORDER — FENTANYL CITRATE 0.05 MG/ML IJ SOLN
INTRAMUSCULAR | Status: AC
  Filled 2013-01-27: qty 4

## 2013-01-27 MED ORDER — SODIUM CHLORIDE 0.9 % IV SOLN: INTRAVENOUS | Status: DC

## 2013-01-27 MED ORDER — MIDAZOLAM HCL 2 MG/2ML IJ SOLN
INTRAMUSCULAR | Status: AC
  Filled 2013-01-27: qty 4

## 2013-01-27 NOTE — Procedures (Signed)
Procedure:  Ultrasound guided core biopsy of liver Findings:  18 G core biopsy x 2 via 17 G needle.  No immediate complications. 

## 2013-01-27 NOTE — H&P (Signed)
Agree 

## 2013-01-27 NOTE — H&P (Signed)
Mark Howell is an 72 y.o. male.   Chief Complaint: Hx psoriatic arthritis On methotrexate x 5 yrs Recent elevation of liver function tests LFTs had come back down OFF methotrexate Scheduled now for liver core biopsy HPI: psoriasis; psoriatic arthritis; smoker; HTN; HLD  Past Medical History  Diagnosis Date  . Arthritis   . GERD (gastroesophageal reflux disease)   . HTN (hypertension)   . Hyperlipidemia     Past Surgical History  Procedure Laterality Date  . Inguinal hernia repair  01/04/2012    Procedure: LAPAROSCOPIC INGUINAL HERNIA;  Surgeon: Atilano Ina, MD,FACS;  Location: [facility] ORS;  Service: General;  Laterality: Right;    Family History  Problem Relation Age of Onset  . Stroke Father   . Diabetes Father   . Heart disease Father   . Cancer Brother     pancreatic   Social History:  reports that he has been smoking Cigarettes.  He has a 25 pack-year smoking history. He has never used smokeless tobacco. He reports that  drinks alcohol. He reports that he does not use illicit drugs.  Allergies:  Allergies  Allergen Reactions  . Hydrocodone Rash     (Not in a hospital admission)  Results for orders placed during the hospital encounter of 01/27/13 (from the past 48 hour(s))  CBC     Status: Abnormal   Collection Time    01/27/13  9:10 AM      Result Value Range   WBC 7.4  4.0 - 10.5 K/uL   RBC 4.78  4.22 - 5.81 MIL/uL   Hemoglobin 15.8  13.0 - 17.0 g/dL   HCT 40.9  81.1 - 91.4 %   MCV 93.3  78.0 - 100.0 fL   MCH 33.1  26.0 - 34.0 pg   MCHC 35.4  30.0 - 36.0 g/dL   RDW 78.2  95.6 - 21.3 %   Platelets 115 (*) 150 - 400 K/uL   No results found.  Review of Systems  Constitutional: Negative for fever and weight loss.  Respiratory: Negative for shortness of breath.   Cardiovascular: Negative for chest pain.  Gastrointestinal: Negative for nausea, vomiting and abdominal pain.  Neurological: Negative for dizziness, weakness and headaches.   Psychiatric/Behavioral: Positive for substance abuse.       Smoker    Blood pressure 146/86, pulse 71, temperature 97.6 F (36.4 C), temperature source Oral, resp. rate 20, height 6\' 2"  (1.88 m), weight 182 lb (82.555 kg), SpO2 98.00%. Physical Exam  Constitutional: He is oriented to person, place, and time. He appears well-nourished.  Cardiovascular: Normal rate, regular rhythm and normal heart sounds.   No murmur heard. Respiratory: Effort normal and breath sounds normal. He has no wheezes.  GI: Soft. Bowel sounds are normal. There is no tenderness.  Musculoskeletal: Normal range of motion.  Neurological: He is alert and oriented to person, place, and time.  Skin: Skin is warm and dry.  Psychiatric: He has a normal mood and affect. His behavior is normal. Judgment and thought content normal.     Assessment/Plan Long hx psoriasis- and arthritis On methotrexate x 5 yrs  Recent elevation of LFTs Scheduled now for liver core biopsy Pt aware of procedure benefits and risks and agreeable to proceed Consent signed and in chart   Ashlan Dignan A 01/27/2013, 9:32 AM

## 2013-08-29 ENCOUNTER — Other Ambulatory Visit: Payer: Self-pay | Admitting: Gastroenterology

## 2013-09-10 ENCOUNTER — Encounter (INDEPENDENT_AMBULATORY_CARE_PROVIDER_SITE_OTHER): Payer: Self-pay | Admitting: General Surgery

## 2013-09-25 ENCOUNTER — Encounter (INDEPENDENT_AMBULATORY_CARE_PROVIDER_SITE_OTHER): Payer: Self-pay | Admitting: General Surgery

## 2013-09-25 ENCOUNTER — Ambulatory Visit (INDEPENDENT_AMBULATORY_CARE_PROVIDER_SITE_OTHER): Payer: Medicare Other | Admitting: General Surgery

## 2013-09-25 VITALS — BP 130/76 | HR 83 | Temp 98.5°F | Ht 73.0 in | Wt 180.0 lb

## 2013-09-25 DIAGNOSIS — K648 Other hemorrhoids: Secondary | ICD-10-CM

## 2013-09-25 MED ORDER — TAMSULOSIN HCL 0.4 MG PO CAPS: 0.4000 mg | ORAL_CAPSULE | Freq: Every day | ORAL | Status: DC

## 2013-09-25 NOTE — Patient Instructions (Signed)
Pick up flomax prescription from pharmacy - start taking 4 days before surgery Do Fleets enema the morning of surgery  GETTING TO [place]. Irregular bowel habits such as constipation and diarrhea can lead to many problems over time.  Having one soft bowel movement a day is the most important way to prevent further problems.  The anorectal canal is designed to handle stretching and feces to safely manage our ability to get rid of solid waste (feces, poop, stool) out of our body.  BUT, hard constipated stools can act like ripping concrete bricks and diarrhea can be a burning fire to this very sensitive area of our body, causing inflamed hemorrhoids, anal fissures, increasing risk is perirectal abscesses, abdominal pain/bloating, an making irritable bowel worse.     The goal: ONE SOFT BOWEL MOVEMENT A DAY!  To have soft, regular bowel movements:    Drink at least 8 tall glasses of water a day.     Take plenty of fiber.  Fiber is the undigested part of plant food that passes into the colon, acting s "natures broom" to encourage bowel motility and movement.  Fiber can absorb and hold large amounts of water. This results in a larger, bulkier stool, which is soft and easier to pass. Work gradually over several weeks up to 6 servings a day of fiber (25g a day even more if needed) in the form of: o Vegetables -- Root (potatoes, carrots, turnips), leafy green (lettuce, salad greens, celery, spinach), or cooked high residue (cabbage, broccoli, etc) o Fruit -- Fresh (unpeeled skin & pulp), Dried (prunes, apricots, cherries, etc ),  or stewed ( applesauce)  o Whole grain breads, pasta, etc (whole wheat)  o Bran cereals    Bulking Agents -- This type of water-retaining fiber generally is easily obtained each day by one of the following:  o Psyllium bran -- The psyllium plant is remarkable because its ground seeds can retain so much water. This product is available as Metamucil, Konsyl, Effersyllium, Per  Diem Fiber, or the less expensive generic preparation in drug and health food stores. Although labeled a laxative, it really is not a laxative.  o Methylcellulose -- This is another fiber derived from wood which also retains water. It is available as Citrucel. o Polyethylene Glycol - and "artificial" fiber commonly called Miralax or Glycolax.  It is helpful for people with gassy or bloated feelings with regular fiber o Flax Seed - a less gassy fiber than psyllium   No reading or other relaxing activity while on the toilet. If bowel movements take longer than 5 minutes, you are too constipated   AVOID CONSTIPATION.  High fiber and water intake usually takes care of this.  Sometimes a laxative is needed to stimulate more frequent bowel movements, but    Laxatives are not a good long-term solution as it can wear the colon out. o Osmotics (Milk of Magnesia, Fleets phosphosoda, Magnesium citrate, MiraLax, GoLytely) are safer than  o Stimulants (Senokot, Castor Oil, Dulcolax, Ex Lax)    o Do not take laxatives for more than 7days in a row.    IF SEVERELY CONSTIPATED, try a Bowel Retraining Program: o Do not use laxatives.  o Eat a diet high in roughage, such as bran cereals and leafy vegetables.  o Drink six (6) ounces of prune or apricot juice each morning.  o Eat two (2) large servings of stewed fruit each day.  o Take one (1) heaping tablespoon of a psyllium-based bulking  agent twice a day. Use sugar-free sweetener when possible to avoid excessive calories.  o Eat a normal breakfast.  o Set aside 15 minutes after breakfast to sit on the toilet, but do not strain to have a bowel movement.  o If you do not have a bowel movement by the third day, use an enema and repeat the above steps.

## 2013-09-25 NOTE — Progress Notes (Signed)
Patient ID: Mark Howell, male   DOB: 10-Jul-1940, 73 y.o.   MRN: [id number]  Chief Complaint  Patient presents with  . eval hems    HPI Mark Howell is a 73 y.o. male.   HPI 73 year old Caucasian male referred by Dr. Watt Climes for evaluation of hemorrhoidal problems. Operated on the patient several years ago for an inguinal hernia. He states he's had hemorrhoidal problems for the past several years. It appears his main complaint is bleeding. He doesn't really have a lot of pain or discomfort except for occasional times when they may swell and flair. He reports 3-4 bowel movements a day. He sits on the commode for less than 10 minutes at a time. He drinks 4-5 glasses of water a day. He does not straining. He does not take supplement of fiber. He has tried numerous over-the-counter ointments and suppositories all without any long-term relief. He had a colonoscopy a few weeks ago which showed hemorrhoidal disease, diverticuli, and a benign tubular adenoma. Past Medical History  Diagnosis Date  . Arthritis   . GERD (gastroesophageal reflux disease)   . HTN (hypertension)   . Hyperlipidemia     Past Surgical History  Procedure Laterality Date  . Inguinal hernia repair  01/04/2012    Procedure: LAPAROSCOPIC INGUINAL HERNIA;  Surgeon: Gayland Curry, MD,FACS;  Location: [facility];  Service: General;  Laterality: Right;    Family History  Problem Relation Age of Onset  . Stroke Father   . Diabetes Father   . Heart disease Father   . Cancer Brother     pancreatic    Social History History  Substance Use Topics  . Smoking status: Current Every Day Smoker -- 0.50 packs/day for 50 years    Types: Cigarettes  . Smokeless tobacco: Never Used  . Alcohol Use: Yes     Comment: occassionally    Allergies  Allergen Reactions  . Hydrocodone Rash    Current Outpatient Prescriptions  Medication Sig Dispense Refill  . aspirin EC 81 MG tablet Take 81 mg by mouth daily.      Marland Kitchen atorvastatin  (LIPITOR) 20 MG tablet Take 20 mg by mouth daily.      . Calcium Carbonate-Vitamin D (CALCIUM 600 + D PO) Take 1 tablet by mouth 2 (two) times daily.      . diphenhydramine-acetaminophen (TYLENOL PM) 25-500 MG TABS Take 1 tablet by mouth at bedtime as needed. For sleep      . folic acid (FOLVITE) 782 MCG tablet Take 400 mcg by mouth daily.      . Glucosamine-Chondroit-Vit C-Mn (GLUCOSAMINE CHONDR 1500 COMPLX PO) Take 1 tablet by mouth 2 (two) times daily.      . Magnesium 250 MG TABS Take 250 mg by mouth daily.      . Multiple Vitamin (MULITIVITAMIN WITH MINERALS) TABS Take 1 tablet by mouth daily.      . Omega-3 Fatty Acids (FISH OIL) 1200 MG CAPS Take 1,200 mg by mouth daily.      . pantoprazole (PROTONIX) 40 MG tablet Take 40 mg by mouth daily.      . predniSONE (DELTASONE) 2.5 MG tablet Take 2.5 mg by mouth every other day. For arthritis      . tamsulosin (FLOMAX) 0.4 MG CAPS capsule Take 1 capsule (0.4 mg total) by mouth daily. Start taking 4 days before your surgery  7 capsule  0   No current facility-administered medications for this visit.    Review of Systems  Review of Systems  Constitutional: Negative for fever, chills, appetite change and unexpected weight change.  HENT: Negative for congestion and trouble swallowing.   Eyes: Negative for visual disturbance.  Respiratory: Negative for chest tightness and shortness of breath.   Cardiovascular: Negative for chest pain and leg swelling.       No PND, no orthopnea, no DOE  Gastrointestinal:       See HPI  Genitourinary: Negative for dysuria and hematuria.  Musculoskeletal: Negative.   Skin: Negative for rash.  Neurological: Negative for seizures and speech difficulty.  Hematological: Does not bruise/bleed easily.  Psychiatric/Behavioral: Negative for behavioral problems and confusion.    Blood pressure 130/76, pulse 83, temperature 98.5 F (36.9 C), height 6\' 1"  (1.854 m), weight 180 lb (81.647 kg).  Physical Exam Physical  Exam  Constitutional: He is oriented to person, place, and time. He appears well-developed and well-nourished. No distress.  HENT:  Head: Normocephalic and atraumatic.  Right Ear: External ear normal.  Left Ear: External ear normal.  Eyes: Conjunctivae are normal. No scleral icterus.  Neck: Normal range of motion. Neck supple. No tracheal deviation present. No thyromegaly present.  Cardiovascular: Normal rate, normal heart sounds and intact distal pulses.   Pulmonary/Chest: Effort normal and breath sounds normal. No respiratory distress. He has no wheezes.  Abdominal: Soft. He exhibits no distension.  Genitourinary:  Prolapsed internal hemorrhoid appears irritated on the left lateral side able to manually reduce. Good tone. No significant external hemorrhoidal burden. Definitely no thrombosed external hemorrhoids.anoscopy shows grade 1 internal hemorrhoids in the right anterior position, grade 2 internal hemorrhoid in the right lateral position. Grade 3 internal hemorrhoid in the left lateral  Musculoskeletal: Normal range of motion. He exhibits no edema and no tenderness.  Lymphadenopathy:    He has no cervical adenopathy.  Neurological: He is alert and oriented to person, place, and time. He exhibits normal muscle tone.  Skin: Skin is warm and dry. No rash noted. He is not diaphoretic. No erythema. No pallor.  Psychiatric: He has a normal mood and affect. His behavior is normal. Judgment and thought content normal.    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 The patient was given educational material as well as diagrams. We discussed nonoperative and operative management of hemorrhoidal disease. The prolapsed internal hemorrhoid I think will need to be surgically excised however his other hemorrhoids may be amenable to banding  We discussed the  importance of having a daily soft bowel movement and avoiding constipation. We also discussed good bowel habits such as not reading in the bathroom, not straining, and drinking 6-8 glasses of water per day. We also discussed the importance of a high fiber diet. We discussed foods that were high in fiber as well as fiber supplements. We discussed the importance of trying to get 25-30 g of fiber per day in their diet. We discussed the need to start with a low dose of fiber and then gradually increasing their daily fiber dose over several weeks in order to avoid bloating and cramping.  We then discussed different surgical techniques for hemorrhoids, specifically hemorrhoidal banding and excisional hemorrhoidectomy.  I discussed the procedure in detail.  The patient was given Neurosurgeon.  We discussed the risks and benefits of surgery including, but not limited to bleeding, infection, blood clot  formation, anesthesia risk, urinary retention, hemorrhoid recurrence, injury to the sphincters resulting in incontinence, and the rare possibility of anal canal narrowing. I explained that the likelihood of improvement of their symptoms is good  We discussed the typical postoperative course.  I stressed the importance of not becoming constipated after surgery.  The patient was encouraged to limit pain medication if possible as this increases the likelihood of becoming constipated. The patient was advised to take stool softners & drink 8-10 glasses of non-carbonated, non-alcoholic beverages per day and to eat a high fiber diet.  I also encouraged soaking in a water warm bath for 15 minutes at a time several times a day and after a bowel movement.  The patient was advised to take laxatives such as milk of magnesia or Miralax if no bowel movement three days after surgery.  The patient was advised to expect some blood tinged drainage as well as some blood in their bowel movements.   He will meet with the schedulers  today to discuss timing of surgery. We will tentatively plan exam under anesthesia, excisional hemorrhoidectomy and hemorrhoidal banding. We will place him on perioperative Flomax to help decrease postoperative urinary retention risk  Leighton Ruff. Redmond Pulling, MD, FACS General, Bariatric, & Minimally Invasive Surgery [facility] [place]          Gayland Curry 09/25/2013, 5:18 PM

## 2013-09-26 ENCOUNTER — Encounter (HOSPITAL_BASED_OUTPATIENT_CLINIC_OR_DEPARTMENT_OTHER): Payer: Self-pay | Admitting: *Deleted

## 2013-09-26 ENCOUNTER — Telehealth (INDEPENDENT_AMBULATORY_CARE_PROVIDER_SITE_OTHER): Payer: Self-pay

## 2013-09-26 NOTE — Progress Notes (Signed)
09/26/13 1646  OBSTRUCTIVE SLEEP APNEA  Have you ever been diagnosed with sleep apnea through a sleep study? No  Do you snore loudly (loud enough to be heard through closed doors)?  1  Do you often feel tired, fatigued, or sleepy during the daytime? 0  Has anyone observed you stop breathing during your sleep? 1  Do you have, or are you being treated for high blood pressure? 0  BMI more than 35 kg/m2? 0  Age over 74 years old? 1  Neck circumference greater than 40 cm/16 inches? 1  Gender: 1  Obstructive Sleep Apnea Score 5  Score 4 or greater  Results sent to PCP

## 2013-09-26 NOTE — Progress Notes (Signed)
Called dr Laqueta Linden office for preop orders-no labs per anesthesia needed-will call pt if needs labs-will do fleets enema as told-

## 2013-09-26 NOTE — Telephone Encounter (Signed)
[facility] called stating that Dr. Redmond Pulling has not put orders in on a surgery scheduled for 10/01/13 on Mark Howell.

## 2013-09-29 ENCOUNTER — Other Ambulatory Visit (INDEPENDENT_AMBULATORY_CARE_PROVIDER_SITE_OTHER): Payer: Self-pay | Admitting: General Surgery

## 2013-09-29 NOTE — Telephone Encounter (Signed)
Just did them

## 2013-10-01 ENCOUNTER — Ambulatory Visit (HOSPITAL_BASED_OUTPATIENT_CLINIC_OR_DEPARTMENT_OTHER): Payer: Medicare Other | Admitting: Anesthesiology

## 2013-10-01 ENCOUNTER — Telehealth (INDEPENDENT_AMBULATORY_CARE_PROVIDER_SITE_OTHER): Payer: Self-pay

## 2013-10-01 ENCOUNTER — Encounter (HOSPITAL_BASED_OUTPATIENT_CLINIC_OR_DEPARTMENT_OTHER): Payer: Medicare Other | Admitting: Anesthesiology

## 2013-10-01 ENCOUNTER — Encounter (HOSPITAL_BASED_OUTPATIENT_CLINIC_OR_DEPARTMENT_OTHER)
Admission: RE | Disposition: A | Discharge status: Home / Self Care | Payer: Self-pay | Source: Ambulatory Visit | Attending: General Surgery

## 2013-10-01 ENCOUNTER — Ambulatory Visit (HOSPITAL_BASED_OUTPATIENT_CLINIC_OR_DEPARTMENT_OTHER)
Admission: RE | Admit: 2013-10-01 | Discharge: 2013-10-01 | Disposition: A | Discharge status: Home / Self Care | Payer: Medicare Other | Source: Ambulatory Visit | Attending: General Surgery | Admitting: General Surgery

## 2013-10-01 ENCOUNTER — Encounter (HOSPITAL_BASED_OUTPATIENT_CLINIC_OR_DEPARTMENT_OTHER): Payer: Self-pay | Admitting: Anesthesiology

## 2013-10-01 DIAGNOSIS — F17200 Nicotine dependence, unspecified, uncomplicated: Secondary | ICD-10-CM | POA: Insufficient documentation

## 2013-10-01 DIAGNOSIS — Z860100 Personal history of colon polyps, unspecified: Secondary | ICD-10-CM | POA: Insufficient documentation

## 2013-10-01 DIAGNOSIS — Z885 Allergy status to narcotic agent status: Secondary | ICD-10-CM | POA: Insufficient documentation

## 2013-10-01 DIAGNOSIS — E785 Hyperlipidemia, unspecified: Secondary | ICD-10-CM | POA: Insufficient documentation

## 2013-10-01 DIAGNOSIS — K648 Other hemorrhoids: Secondary | ICD-10-CM

## 2013-10-01 DIAGNOSIS — K644 Residual hemorrhoidal skin tags: Secondary | ICD-10-CM

## 2013-10-01 DIAGNOSIS — Z7982 Long term (current) use of aspirin: Secondary | ICD-10-CM | POA: Insufficient documentation

## 2013-10-01 DIAGNOSIS — IMO0002 Reserved for concepts with insufficient information to code with codable children: Secondary | ICD-10-CM | POA: Insufficient documentation

## 2013-10-01 DIAGNOSIS — K219 Gastro-esophageal reflux disease without esophagitis: Secondary | ICD-10-CM | POA: Insufficient documentation

## 2013-10-01 DIAGNOSIS — M129 Arthropathy, unspecified: Secondary | ICD-10-CM | POA: Insufficient documentation

## 2013-10-01 DIAGNOSIS — Z79899 Other long term (current) drug therapy: Secondary | ICD-10-CM | POA: Insufficient documentation

## 2013-10-01 DIAGNOSIS — I10 Essential (primary) hypertension: Secondary | ICD-10-CM | POA: Insufficient documentation

## 2013-10-01 DIAGNOSIS — K5730 Diverticulosis of large intestine without perforation or abscess without bleeding: Secondary | ICD-10-CM | POA: Insufficient documentation

## 2013-10-01 HISTORY — DX: Arthropathic psoriasis, unspecified: L40.50

## 2013-10-01 HISTORY — PX: HEMORRHOID SURGERY: SHX153

## 2013-10-01 HISTORY — DX: Unspecified hearing loss, unspecified ear: H91.90

## 2013-10-01 HISTORY — DX: Presence of spectacles and contact lenses: Z97.3

## 2013-10-01 HISTORY — DX: Adverse effect of unspecified anesthetic, initial encounter: T41.45XA

## 2013-10-01 HISTORY — DX: Presence of dental prosthetic device (complete) (partial): Z97.2

## 2013-10-01 HISTORY — DX: Snoring: R06.83

## 2013-10-01 HISTORY — DX: Complete loss of teeth, unspecified cause, unspecified class: K08.109

## 2013-10-01 HISTORY — DX: Other complications of anesthesia, initial encounter: T88.59XA

## 2013-10-01 SURGERY — HEMORRHOIDECTOMY
Anesthesia: General | Site: Anus

## 2013-10-01 MED ORDER — ACETAMINOPHEN 325 MG PO TABS: 650.0000 mg | ORAL_TABLET | ORAL | Status: DC | PRN

## 2013-10-01 MED ORDER — DEXAMETHASONE SODIUM PHOSPHATE 4 MG/ML IJ SOLN
INTRAMUSCULAR | Status: DC | PRN
  Administered 2013-10-01: 10 mg via INTRAVENOUS

## 2013-10-01 MED ORDER — LIDOCAINE HCL (CARDIAC) 20 MG/ML IV SOLN
INTRAVENOUS | Status: DC | PRN
Start: 2013-10-01 — End: 2013-10-01
  Administered 2013-10-01: 50 mg via INTRAVENOUS

## 2013-10-01 MED ORDER — ACETAMINOPHEN 650 MG RE SUPP: 650.0000 mg | RECTAL | Status: DC | PRN

## 2013-10-01 MED ORDER — SODIUM CHLORIDE 0.9 % IJ SOLN: 3.0000 mL | INTRAMUSCULAR | Status: DC | PRN

## 2013-10-01 MED ORDER — ONDANSETRON HCL 4 MG/2ML IJ SOLN: 4.0000 mg | Freq: Once | INTRAMUSCULAR | Status: DC | PRN

## 2013-10-01 MED ORDER — LACTATED RINGERS IV SOLN
INTRAVENOUS | Status: DC
Start: 2013-10-01 — End: 2013-10-01
  Administered 2013-10-01: 10:00:00 via INTRAVENOUS

## 2013-10-01 MED ORDER — HYDROMORPHONE HCL 4 MG PO TABS: 4.0000 mg | ORAL_TABLET | ORAL | Status: DC | PRN

## 2013-10-01 MED ORDER — SODIUM CHLORIDE 0.9 % IV SOLN: 250.0000 mL | INTRAVENOUS | Status: DC | PRN

## 2013-10-01 MED ORDER — BUPIVACAINE HCL (PF) 0.25 % IJ SOLN
INTRAMUSCULAR | Status: AC
  Filled 2013-10-01: qty 30

## 2013-10-01 MED ORDER — BUPIVACAINE LIPOSOME 1.3 % IJ SUSP
INTRAMUSCULAR | Status: AC
  Filled 2013-10-01: qty 20

## 2013-10-01 MED ORDER — ONDANSETRON HCL 4 MG/2ML IJ SOLN
INTRAMUSCULAR | Status: DC | PRN
  Administered 2013-10-01: 4 mg via INTRAVENOUS

## 2013-10-01 MED ORDER — MIDAZOLAM HCL 2 MG/2ML IJ SOLN
INTRAMUSCULAR | Status: AC
  Filled 2013-10-01: qty 4

## 2013-10-01 MED ORDER — PROPOFOL 10 MG/ML IV BOLUS
INTRAVENOUS | Status: DC | PRN
  Administered 2013-10-01: 200 mg via INTRAVENOUS

## 2013-10-01 MED ORDER — SUCCINYLCHOLINE CHLORIDE 20 MG/ML IJ SOLN
INTRAMUSCULAR | Status: DC | PRN
  Administered 2013-10-01: 50 mg via INTRAVENOUS

## 2013-10-01 MED ORDER — FENTANYL CITRATE 0.05 MG/ML IJ SOLN
INTRAMUSCULAR | Status: DC | PRN
  Administered 2013-10-01 (×3): 50 ug via INTRAVENOUS
  Administered 2013-10-01: 25 ug via INTRAVENOUS
  Administered 2013-10-01: 50 ug via INTRAVENOUS
  Administered 2013-10-01: 25 ug via INTRAVENOUS

## 2013-10-01 MED ORDER — DIBUCAINE 1 % RE OINT
TOPICAL_OINTMENT | RECTAL | Status: AC
  Filled 2013-10-01: qty 28

## 2013-10-01 MED ORDER — MIDAZOLAM HCL 2 MG/2ML IJ SOLN: 1.0000 mg | INTRAMUSCULAR | Status: DC | PRN

## 2013-10-01 MED ORDER — BUPIVACAINE-EPINEPHRINE 0.25% -1:200000 IJ SOLN
INTRAMUSCULAR | Status: DC | PRN
  Administered 2013-10-01: 1 mL

## 2013-10-01 MED ORDER — BUPIVACAINE LIPOSOME 1.3 % IJ SUSP
INTRAMUSCULAR | Status: DC | PRN
  Administered 2013-10-01: 20 mL

## 2013-10-01 MED ORDER — FENTANYL CITRATE 0.05 MG/ML IJ SOLN
25.0000 ug | INTRAMUSCULAR | Status: DC | PRN
  Administered 2013-10-01: 50 ug via INTRAVENOUS
  Administered 2013-10-01: 25 ug via INTRAVENOUS

## 2013-10-01 MED ORDER — FENTANYL CITRATE 0.05 MG/ML IJ SOLN
INTRAMUSCULAR | Status: AC
  Filled 2013-10-01: qty 10

## 2013-10-01 MED ORDER — FENTANYL CITRATE 0.05 MG/ML IJ SOLN: 50.0000 ug | INTRAMUSCULAR | Status: DC | PRN

## 2013-10-01 MED ORDER — BUPIVACAINE-EPINEPHRINE (PF) 0.25% -1:200000 IJ SOLN
INTRAMUSCULAR | Status: AC
  Filled 2013-10-01: qty 30

## 2013-10-01 MED ORDER — FLEET ENEMA 7-19 GM/118ML RE ENEM: 1.0000 | ENEMA | Freq: Once | RECTAL | Status: DC

## 2013-10-01 MED ORDER — LACTATED RINGERS IV SOLN
INTRAVENOUS | Status: DC
  Administered 2013-10-01 (×2): via INTRAVENOUS

## 2013-10-01 MED ORDER — SODIUM CHLORIDE 0.9 % IJ SOLN: 3.0000 mL | Freq: Two times a day (BID) | INTRAMUSCULAR | Status: DC

## 2013-10-01 MED ORDER — MORPHINE SULFATE 2 MG/ML IJ SOLN
1.0000 mg | INTRAMUSCULAR | Status: DC | PRN
Start: 2013-10-01 — End: 2013-10-01

## 2013-10-01 MED ORDER — FENTANYL CITRATE 0.05 MG/ML IJ SOLN
INTRAMUSCULAR | Status: AC
  Filled 2013-10-01: qty 2

## 2013-10-01 SURGICAL SUPPLY — 43 items
APL SKNCLS STERI-STRIP NONHPOA (GAUZE/BANDAGES/DRESSINGS) ×1
BENZOIN TINCTURE PRP APPL 2/3 (GAUZE/BANDAGES/DRESSINGS) ×3 IMPLANT
BLADE 11 SAFETY STRL DISP (BLADE) IMPLANT
BLADE 15 SAFETY STRL DISP (BLADE) ×3 IMPLANT
BRIEF STRETCH FOR OB PAD LRG (UNDERPADS AND DIAPERS) ×3 IMPLANT
CANISTER SUCTION 1200CC (MISCELLANEOUS) ×3 IMPLANT
COVER MAYO STAND STRL (DRAPES) ×3 IMPLANT
COVER TABLE BACK 60X90 (DRAPES) ×3 IMPLANT
DECANTER SPIKE VIAL GLASS SM (MISCELLANEOUS) IMPLANT
DRAPE PED LAPAROTOMY (DRAPES) ×3 IMPLANT
DRAPE UTILITY XL STRL (DRAPES) ×3 IMPLANT
DRSG PAD ABDOMINAL 8X10 ST (GAUZE/BANDAGES/DRESSINGS) ×3 IMPLANT
ELECT COATED BLADE 2.86 ST (ELECTRODE) ×3 IMPLANT
ELECT REM PT RETURN 9FT ADLT (ELECTROSURGICAL) ×3
ELECTRODE REM PT RTRN 9FT ADLT (ELECTROSURGICAL) ×1 IMPLANT
GAUZE SPONGE 4X4 12PLY STRL (GAUZE/BANDAGES/DRESSINGS) ×3 IMPLANT
GAUZE VASELINE 1X8 (GAUZE/BANDAGES/DRESSINGS) IMPLANT
GLOVE BIO SURGEON STRL SZ7.5 (GLOVE) ×3 IMPLANT
GLOVE BIOGEL PI IND STRL 8 (GLOVE) ×1 IMPLANT
GLOVE BIOGEL PI INDICATOR 8 (GLOVE) ×2
GOWN STRL REUS W/ TWL LRG LVL3 (GOWN DISPOSABLE) ×1 IMPLANT
GOWN STRL REUS W/ TWL XL LVL3 (GOWN DISPOSABLE) ×1 IMPLANT
GOWN STRL REUS W/TWL LRG LVL3 (GOWN DISPOSABLE) ×3
GOWN STRL REUS W/TWL XL LVL3 (GOWN DISPOSABLE) ×3
NDL HYPO 25X1 1.5 SAFETY (NEEDLE) ×1 IMPLANT
NEEDLE HYPO 25X1 1.5 SAFETY (NEEDLE) ×3 IMPLANT
PACK BASIN DAY SURGERY FS (CUSTOM PROCEDURE TRAY) ×3 IMPLANT
PENCIL BUTTON HOLSTER BLD 10FT (ELECTRODE) ×3 IMPLANT
SHEARS HARMONIC 9CM CVD (BLADE) ×3 IMPLANT
SLEEVE SCD COMPRESS KNEE MED (MISCELLANEOUS) ×3 IMPLANT
SPONGE GAUZE 4X4 12PLY STER LF (GAUZE/BANDAGES/DRESSINGS) IMPLANT
SPONGE SURGIFOAM ABS GEL 100 (HEMOSTASIS) IMPLANT
SURGILUBE 2OZ TUBE FLIPTOP (MISCELLANEOUS) ×3 IMPLANT
SUT CHROMIC 3 0 SH 27 (SUTURE) ×3 IMPLANT
SYR CONTROL 10ML LL (SYRINGE) ×3 IMPLANT
TAPE CLOTH 3X10 TAN LF (GAUZE/BANDAGES/DRESSINGS) ×3 IMPLANT
TOWEL OR 17X24 6PK STRL BLUE (TOWEL DISPOSABLE) ×3 IMPLANT
TOWEL OR NON WOVEN STRL DISP B (DISPOSABLE) ×3 IMPLANT
TRAY DSU PREP LF (CUSTOM PROCEDURE TRAY) ×3 IMPLANT
TUBE CONNECTING 20'X1/4 (TUBING) ×1
TUBE CONNECTING 20X1/4 (TUBING) ×2 IMPLANT
UNDERPAD 30X30 INCONTINENT (UNDERPADS AND DIAPERS) IMPLANT
YANKAUER SUCT BULB TIP NO VENT (SUCTIONS) ×3 IMPLANT

## 2013-10-01 NOTE — Anesthesia Procedure Notes (Signed)
Procedure Name: Intubation Date/Time: 10/01/2013 10:32 AM Performed by: Marrianne Mood Pre-anesthesia Checklist: Patient identified, Emergency Drugs available, Suction available, Patient being monitored and Timeout performed Patient Re-evaluated:Patient Re-evaluated prior to inductionOxygen Delivery Method: Circle System Utilized Preoxygenation: Pre-oxygenation with 100% oxygen Intubation Type: IV induction Ventilation: Mask ventilation without difficulty Laryngoscope Size: Miller and 3 Grade View: Grade II Tube type: Oral Tube size: 8.0 mm Number of attempts: 1 Airway Equipment and Method: stylet and oral airway Placement Confirmation: ETT inserted through vocal cords under direct vision,  positive ETCO2 and breath sounds checked- equal and bilateral Secured at: 21 cm Tube secured with: Tape Dental Injury: Teeth and Oropharynx as per pre-operative assessment

## 2013-10-01 NOTE — Anesthesia Postprocedure Evaluation (Signed)
Anesthesia Post Note  Patient: Mark Howell  Procedure(s) Performed: Procedure(s) (LRB): EXAM UNDER ANESTHESIA  AND EXCISIONAL HEMORRHOIDECTOMY WITH HEMORRHOID BANDING X 2 (N/A)  Anesthesia type: General  Patient location: PACU  Post pain: Pain level controlled  Post assessment: Patient's Cardiovascular Status Stable  Last Vitals:  Filed Vitals:   10/01/13 1225  BP:   Pulse: 87  Temp:   Resp: 15    Post vital signs: Reviewed and stable  Level of consciousness: alert  Complications: No apparent anesthesia complications

## 2013-10-01 NOTE — Brief Op Note (Signed)
10/01/2013  11:22 AM  PATIENT:  [name]  73 y.o. male  PRE-OPERATIVE DIAGNOSIS:  GRADE II AND III INTERNAL HEMORRHOIDS  POST-OPERATIVE DIAGNOSIS:  GRADE II AND III INTERNAL HEMORRHOIDS  PROCEDURE:  Procedure(s): EXAM UNDER ANESTHESIA  AND EXCISIONAL HEMORRHOIDECTOMY WITH HEMORRHOID BANDING X 2 (N/A)  SURGEON:  Surgeon(s) and Role:    * Gayland Curry, MD - Primary  PHYSICIAN ASSISTANT: Ruben Im, PA-Student  ASSISTANTS: none   ANESTHESIA:   general  EBL:  Total I/O In: 1200 [I.V.:1200] Out: -   BLOOD ADMINISTERED:none  DRAINS: none   LOCAL MEDICATIONS USED:  OTHER exparel  SPECIMEN:  Source of Specimen:  Rt posterolateral ext/int hemorrhoid  DISPOSITION OF SPECIMEN:  PATHOLOGY  COUNTS:  YES  TOURNIQUET:  * No tourniquets in log *  DICTATION: .Other Dictation: Dictation Number [id number]  PLAN OF CARE: Discharge to home after PACU  PATIENT DISPOSITION:  PACU - hemodynamically stable.   Delay start of Pharmacological VTE agent (>24hrs) due to surgical blood loss or risk of bleeding: no

## 2013-10-01 NOTE — Telephone Encounter (Signed)
Pt s/p excisional hemorrhoidectomy. Pts wife states that pt feels like there is guaze at the surgery site and wanted to know if this was guaze or if this was sutures. Advised pt that we normally use dissolvable sutures and they will dissolve on there own. Pt states that this is not bothering him, they just wanted to make sure they didn't need to be doing anything with these.  Pt also wanted to know if he needed to continue taking his Flomax that Dr Redmond Pulling had ordered him to take 4 days prior to surgery. Informed pt that I would send Dr Redmond Pulling a message and we would get back in touch with him. Pt verbalized understanding.

## 2013-10-01 NOTE — Interval H&P Note (Signed)
History and Physical Interval Note:  10/01/2013 10:08 AM  Mark Howell  has presented today for surgery, with the diagnosis of GRADE II AND III INTERNAL HEMORRHOIDS  The various methods of treatment have been discussed with the patient and family. After consideration of risks, benefits and other options for treatment, the patient has consented to  Procedure(s): EXAM UNDER ANESTHESIA AND HEMORRHOIDECTOMY WITH HEMORRHOID BANDING (N/A) as a surgical intervention .  The patient's history has been reviewed, patient examined, no change in status, stable for surgery.  I have reviewed the patient's chart and labs.  Questions were answered to the patient's satisfaction.    Leighton Ruff. Redmond Pulling, MD, [place], Bariatric, & Minimally Invasive Surgery [facility], [place]   Gayland Curry

## 2013-10-01 NOTE — Transfer of Care (Signed)
Immediate Anesthesia Transfer of Care Note  Patient: Mark Howell  Procedure(s) Performed: Procedure(s): EXAM UNDER ANESTHESIA  AND EXCISIONAL HEMORRHOIDECTOMY WITH HEMORRHOID BANDING X 2 (N/A)  Patient Location: PACU  Anesthesia Type:General  Level of Consciousness: awake, alert , oriented and patient cooperative  Airway & Oxygen Therapy: Patient Spontanous Breathing and Patient connected to face mask oxygen  Post-op Assessment: Report given to PACU RN and Post -op Vital signs reviewed and stable  Post vital signs: Reviewed and stable  Complications: No apparent anesthesia complications

## 2013-10-01 NOTE — Discharge Instructions (Signed)
[facility] _______[facility]  RECTAL SURGERY POST OP INSTRUCTIONS: POST OP INSTRUCTIONS  Always review your discharge instruction sheet given to you by the facility where your surgery was performed. IF YOU HAVE DISABILITY OR FAMILY LEAVE FORMS, YOU MUST BRING THEM TO THE OFFICE FOR PROCESSING.   DO NOT GIVE THEM TO YOUR DOCTOR.  1. A  prescription for pain medication may be given to you upon discharge.  Take your pain medication as prescribed, if needed.  If narcotic pain medicine is not needed, then you may take acetaminophen (Tylenol) or ibuprofen (Advil) as needed. 2. Take your usually prescribed medications unless otherwise directed. 3. If you need a refill on your pain medication, please contact your pharmacy.  They will contact our office to request authorization. Prescriptions will not be filled after 5 pm or on week-ends. 4. You should follow a light diet the first 48 hours after arrival home, such as soup and crackers, etc.  Be sure to include lots of fluids daily.  Resume your normal diet 2-3 days after surgery.. 5. Most patients will experience some swelling and discomfort in the rectal area. Ice packs, reclining and warm tub soaks will help.  Swelling and discomfort can take several days to resolve.  6. It is common to experience some constipation if taking pain medication after surgery.  Increasing fluid intake and taking a stool softener (such as Colace) will usually help or prevent this problem from occurring.  A mild laxative (Milk of Magnesia or Miralax) should be taken according to package directions if there are no bowel movements after 48 hours. 7. Soak in warm water tub 3-4 times a day for 15 minutes and after a bowel movement. This will provide a lot relief. 8. Drink 6-8 glasses of water a day 9. Eat a high fiber diet - take a fiber supplement - Benefiber or metamucil- start with low dose and titrate up to avoid bloating and cramping.  10. Unless discharge  instructions indicate otherwise, leave your bandage dry and in place for 24 hours, or remove the bandage if you have a bowel movement. You may notice a small amount of bleeding with bowel movements for the first few days. You may have some packing in the rectum which will come out over the first day or two. You will need to wear an absorbent pad or soft cotton gauze in your underwear until the drainage stops.it. 11. ACTIVITIES:  You may resume regular (light) daily activities beginning the next day--such as daily self-care, walking, climbing stairs--gradually increasing activities as tolerated.  You may have sexual intercourse when it is comfortable.  Refrain from any heavy lifting or straining until approved by your doctor. a. You may drive when you are no longer taking prescription pain medication, you can comfortably wear a seatbelt, and you can safely maneuver your car and apply brakes. b. RETURN TO WORK: : ____________________ c.  12. You should see your doctor in the office for a follow-up appointment approximately 2-3 weeks after your surgery.  Make sure that you call for this appointment within a day or two after you arrive home to insure a convenient appointment time. 13. OTHER INSTRUCTIONS:  __________________________________________________________________________________________________________________________________________________________________________________________  WHEN TO CALL YOUR DOCTOR: 1. Fever over 101.0 2. Inability to urinate 3. Nausea and/or vomiting 4. Extreme swelling or bruising 5. Continued bleeding from rectum. 6. Increased pain, redness, or drainage from the incision 7. Constipation  The clinic staff is available to answer your questions during regular business hours.  Please dont hesitate to call and ask to speak to one of the nurses for clinical concerns.  If you have a medical emergency, go to the nearest emergency room or call 911.  A surgeon from [facility] is always on call at the hospital   [address] [place], [place], [place]  [address] ?  [address], [place], [place]   [address] [phone] ? [phone] ? FAX [phone] Web site: [email or web address]   Post Anesthesia Home Care Instructions  Activity: Get plenty of rest for the remainder of the day. A responsible adult should stay with you for 24 hours following the procedure.  For the next 24 hours, DO NOT: -Drive a car -Paediatric nurse -Drink alcoholic beverages -Take any medication unless instructed by your physician -Make any legal decisions or sign important papers.  Meals: Start with liquid foods such as gelatin or soup. Progress to regular foods as tolerated. Avoid greasy, spicy, heavy foods. If nausea and/or vomiting occur, drink only clear liquids until the nausea and/or vomiting subsides. Call your physician if vomiting continues.  Special Instructions/Symptoms: Your throat may feel dry or sore from the anesthesia or the breathing tube placed in your throat during surgery. If this causes discomfort, gargle with warm salt water. The discomfort should disappear within 24 hours.  Information for Discharge Teaching: EXPAREL (bupivacaine liposome injectable suspension)   Your surgeon gave you EXPAREL(bupivacaine) in your surgical incision to help control your pain after surgery.   EXPAREL is a local anesthetic that provides pain relief by numbing the tissue around the surgical site.  EXPAREL is designed to release pain medication over time and can control pain for up to 72 hours.  Depending on how you respond to EXPAREL, you may require less pain medication during your recovery.  Possible side effects:  Temporary loss of sensation or ability to move in the area where bupivacaine was injected.  Nausea, vomiting, constipation  Rarely, numbness and tingling in your mouth or lips, lightheadedness, or anxiety may  occur.  Call your doctor right away if you think you may be experiencing any of these sensations, or if you have other questions regarding possible side effects.  Follow all other discharge instructions given to you by your surgeon or nurse. Eat a healthy diet and drink plenty of water or other fluids.  If you return to the hospital for any reason within 96 hours following the administration of EXPAREL, please inform your health care providers.

## 2013-10-01 NOTE — H&P (View-Only) (Signed)
Patient ID: Mark Howell, male   DOB: Oct 17, 1940, 73 y.o.   MRN: [id number]  Chief Complaint  Patient presents with  . eval hems    HPI Mark Howell is a 73 y.o. male.   HPI 73 year old Caucasian male referred by Dr. Watt Climes for evaluation of hemorrhoidal problems. Operated on the patient several years ago for an inguinal hernia. He states he's had hemorrhoidal problems for the past several years. It appears his main complaint is bleeding. He doesn't really have a lot of pain or discomfort except for occasional times when they may swell and flair. He reports 3-4 bowel movements a day. He sits on the commode for less than 10 minutes at a time. He drinks 4-5 glasses of water a day. He does not straining. He does not take supplement of fiber. He has tried numerous over-the-counter ointments and suppositories all without any long-term relief. He had a colonoscopy a few weeks ago which showed hemorrhoidal disease, diverticuli, and a benign tubular adenoma. Past Medical History  Diagnosis Date  . Arthritis   . GERD (gastroesophageal reflux disease)   . HTN (hypertension)   . Hyperlipidemia     Past Surgical History  Procedure Laterality Date  . Inguinal hernia repair  01/04/2012    Procedure: LAPAROSCOPIC INGUINAL HERNIA;  Surgeon: Gayland Curry, MD,FACS;  Location: [facility];  Service: General;  Laterality: Right;    Family History  Problem Relation Age of Onset  . Stroke Father   . Diabetes Father   . Heart disease Father   . Cancer Brother     pancreatic    Social History History  Substance Use Topics  . Smoking status: Current Every Day Smoker -- 0.50 packs/day for 50 years    Types: Cigarettes  . Smokeless tobacco: Never Used  . Alcohol Use: Yes     Comment: occassionally    Allergies  Allergen Reactions  . Hydrocodone Rash    Current Outpatient Prescriptions  Medication Sig Dispense Refill  . aspirin EC 81 MG tablet Take 81 mg by mouth daily.      Marland Kitchen atorvastatin  (LIPITOR) 20 MG tablet Take 20 mg by mouth daily.      . Calcium Carbonate-Vitamin D (CALCIUM 600 + D PO) Take 1 tablet by mouth 2 (two) times daily.      . diphenhydramine-acetaminophen (TYLENOL PM) 25-500 MG TABS Take 1 tablet by mouth at bedtime as needed. For sleep      . folic acid (FOLVITE) 427 MCG tablet Take 400 mcg by mouth daily.      . Glucosamine-Chondroit-Vit C-Mn (GLUCOSAMINE CHONDR 1500 COMPLX PO) Take 1 tablet by mouth 2 (two) times daily.      . Magnesium 250 MG TABS Take 250 mg by mouth daily.      . Multiple Vitamin (MULITIVITAMIN WITH MINERALS) TABS Take 1 tablet by mouth daily.      . Omega-3 Fatty Acids (FISH OIL) 1200 MG CAPS Take 1,200 mg by mouth daily.      . pantoprazole (PROTONIX) 40 MG tablet Take 40 mg by mouth daily.      . predniSONE (DELTASONE) 2.5 MG tablet Take 2.5 mg by mouth every other day. For arthritis      . tamsulosin (FLOMAX) 0.4 MG CAPS capsule Take 1 capsule (0.4 mg total) by mouth daily. Start taking 4 days before your surgery  7 capsule  0   No current facility-administered medications for this visit.    Review of Systems  Review of Systems  Constitutional: Negative for fever, chills, appetite change and unexpected weight change.  HENT: Negative for congestion and trouble swallowing.   Eyes: Negative for visual disturbance.  Respiratory: Negative for chest tightness and shortness of breath.   Cardiovascular: Negative for chest pain and leg swelling.       No PND, no orthopnea, no DOE  Gastrointestinal:       See HPI  Genitourinary: Negative for dysuria and hematuria.  Musculoskeletal: Negative.   Skin: Negative for rash.  Neurological: Negative for seizures and speech difficulty.  Hematological: Does not bruise/bleed easily.  Psychiatric/Behavioral: Negative for behavioral problems and confusion.    Blood pressure 130/76, pulse 83, temperature 98.5 F (36.9 C), height 6\' 1"  (1.854 m), weight 180 lb (81.647 kg).  Physical Exam Physical  Exam  Constitutional: He is oriented to person, place, and time. He appears well-developed and well-nourished. No distress.  HENT:  Head: Normocephalic and atraumatic.  Right Ear: External ear normal.  Left Ear: External ear normal.  Eyes: Conjunctivae are normal. No scleral icterus.  Neck: Normal range of motion. Neck supple. No tracheal deviation present. No thyromegaly present.  Cardiovascular: Normal rate, normal heart sounds and intact distal pulses.   Pulmonary/Chest: Effort normal and breath sounds normal. No respiratory distress. He has no wheezes.  Abdominal: Soft. He exhibits no distension.  Genitourinary:  Prolapsed internal hemorrhoid appears irritated on the left lateral side able to manually reduce. Good tone. No significant external hemorrhoidal burden. Definitely no thrombosed external hemorrhoids.anoscopy shows grade 1 internal hemorrhoids in the right anterior position, grade 2 internal hemorrhoid in the right lateral position. Grade 3 internal hemorrhoid in the left lateral  Musculoskeletal: Normal range of motion. He exhibits no edema and no tenderness.  Lymphadenopathy:    He has no cervical adenopathy.  Neurological: He is alert and oriented to person, place, and time. He exhibits normal muscle tone.  Skin: Skin is warm and dry. No rash noted. He is not diaphoretic. No erythema. No pallor.  Psychiatric: He has a normal mood and affect. His behavior is normal. Judgment and thought content normal.    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 The patient was given educational material as well as diagrams. We discussed nonoperative and operative management of hemorrhoidal disease. The prolapsed internal hemorrhoid I think will need to be surgically excised however his other hemorrhoids may be amenable to banding  We discussed the  importance of having a daily soft bowel movement and avoiding constipation. We also discussed good bowel habits such as not reading in the bathroom, not straining, and drinking 6-8 glasses of water per day. We also discussed the importance of a high fiber diet. We discussed foods that were high in fiber as well as fiber supplements. We discussed the importance of trying to get 25-30 g of fiber per day in their diet. We discussed the need to start with a low dose of fiber and then gradually increasing their daily fiber dose over several weeks in order to avoid bloating and cramping.  We then discussed different surgical techniques for hemorrhoids, specifically hemorrhoidal banding and excisional hemorrhoidectomy.  I discussed the procedure in detail.  The patient was given Neurosurgeon.  We discussed the risks and benefits of surgery including, but not limited to bleeding, infection, blood clot  formation, anesthesia risk, urinary retention, hemorrhoid recurrence, injury to the sphincters resulting in incontinence, and the rare possibility of anal canal narrowing. I explained that the likelihood of improvement of their symptoms is good  We discussed the typical postoperative course.  I stressed the importance of not becoming constipated after surgery.  The patient was encouraged to limit pain medication if possible as this increases the likelihood of becoming constipated. The patient was advised to take stool softners & drink 8-10 glasses of non-carbonated, non-alcoholic beverages per day and to eat a high fiber diet.  I also encouraged soaking in a water warm bath for 15 minutes at a time several times a day and after a bowel movement.  The patient was advised to take laxatives such as milk of magnesia or Miralax if no bowel movement three days after surgery.  The patient was advised to expect some blood tinged drainage as well as some blood in their bowel movements.   He will meet with the schedulers  today to discuss timing of surgery. We will tentatively plan exam under anesthesia, excisional hemorrhoidectomy and hemorrhoidal banding. We will place him on perioperative Flomax to help decrease postoperative urinary retention risk  Leighton Ruff. Redmond Pulling, MD, FACS General, Bariatric, & Minimally Invasive Surgery [facility]          Gayland Curry 09/25/2013, 5:18 PM

## 2013-10-01 NOTE — Anesthesia Preprocedure Evaluation (Addendum)
Anesthesia Evaluation  Patient identified by MRN, date of birth, ID band Patient awake    Reviewed: Allergy & Precautions, H&P , NPO status , Patient's Chart, lab work & pertinent test results  Airway Mallampati: I TM Distance: >3 FB Neck ROM: Full    Dental  (+) Upper Dentures, Lower Dentures, Dental Advisory Given   Pulmonary Current Smoker,  breath sounds clear to auscultation        Cardiovascular hypertension, Pt. on medications Rhythm:Regular Rate:Normal     Neuro/Psych    GI/Hepatic GERD-  Medicated and Controlled,  Endo/Other    Renal/GU      Musculoskeletal   Abdominal   Peds  Hematology   Anesthesia Other Findings   Reproductive/Obstetrics                          Anesthesia Physical Anesthesia Plan  ASA: II  Anesthesia Plan: General   Post-op Pain Management:    Induction: Intravenous  Airway Management Planned: LMA  Additional Equipment:   Intra-op Plan:   Post-operative Plan: Extubation in OR  Informed Consent: I have reviewed the patients History and Physical, chart, labs and discussed the procedure including the risks, benefits and alternatives for the proposed anesthesia with the patient or authorized representative who has indicated his/her understanding and acceptance.   Dental advisory given  Plan Discussed with: CRNA, Anesthesiologist and Surgeon  Anesthesia Plan Comments:         Anesthesia Quick Evaluation

## 2013-10-02 ENCOUNTER — Encounter (HOSPITAL_BASED_OUTPATIENT_CLINIC_OR_DEPARTMENT_OTHER): Payer: Self-pay | Admitting: General Surgery

## 2013-10-02 LAB — POCT HEMOGLOBIN-HEMACUE: Hemoglobin: 17.2 g/dL — ABNORMAL HIGH (ref 13.0–17.0)

## 2013-10-02 NOTE — Op Note (Signed)
NAME:  KAYSON, TASKER NO.:  [id number]  MEDICAL RECORD NO.:  [id number]  LOCATION:                                 FACILITY:  PHYSICIAN:  Leighton Ruff. Redmond Pulling, MD, FACSDATE OF BIRTH:  May 13, 1940  DATE OF PROCEDURE:  10/01/2013 DATE OF DISCHARGE:                              OPERATIVE REPORT   PREOPERATIVE DIAGNOSES:  Grade 2 and grade 3 bleeding internal hemorrhoids.  POSTOPERATIVE DIAGNOSES: 1. Grade 3 right posterior lateral internal hemorrhoid. 2. Grade 2 right anterior and left lateral internal hemorrhoids.  PROCEDURE:  Exam under anesthesia, excision of right posterior lateral internal-external hemorrhoid; hemorrhoidal banding of right anterior and left lateral internal hemorrhoid.  SURGEON:  Leighton Ruff. Redmond Pulling, MD, FACS  ASSISTANT:  Shannan Harper, PA student.  ANESTHESIA:  General.  EBL:  Minimal.  INDICATIONS FOR PROCEDURE:  The patient is a very pleasant gentleman who has been having significant problems with bleeding internal hemorrhoids. They have gotten to the point where they are interfering with his daily activities.  He has good bowel habits and has daily bowel movements and otherwise has good bowel hygiene.  In the office, he had evidence of a grade 3 irritated internal hemorrhoid that was able to be mainly reduced, he also had some evidence of some grade 2 internal hemorrhoid as well.  We discussed hemorrhoidectomy with hemorrhoidal banding.  We discussed the risks and benefits of surgery including but not limited to, bleeding, infection, injury to surrounding structures, hemorrhoidal recurrence, urinary retention, pain after surgery, blood clot formation, infection, abscess, and fistula formation.  He elected to proceed to surgery.  DESCRIPTION OF PROCEDURE:  After obtaining informed consent, the patient was taken to the operating room, and general endotracheal anesthesia was established at [place] in [place] [address].  He was  then placed in the prone jack-knife position with the appropriate padding. Sequential compression devices had been placed.  His buttocks were taped apart.  His buttocks and perineum and anus and rectum were prepped with Betadine.  A surgical time-out was performed.  He had a prolapsed right posterior lateral hemorrhoid sticking out.  He had circumferential external hemorrhoidal tissue that was nonthrombosed but just redundant. Digital rectal exam was performed and then anoscopy.  He had that large based right posterior lateral internal-external hemorrhoid as well as a right anterior internal hemorrhoid as well as a left lateral internal hemorrhoid.  The right posterolateral was not amenable to banding, therefore I injected 1 mL of 0.25% Marcaine with epi around the external hemorrhoidal tissue to help with hemostasis, then I made a V-shaped incision with a 15-blade just outside the external hemorrhoidal tissue. Then, I lifted up on the external hemorrhoidal tissue and dissected it off the underlying sphincter muscles with a hemostat.  Then, Harmonic Scalpel was then used to free the external hemorrhoidal tissue, I then carried my dissection proximally into the internal hemorrhoids again using a hemostat to lift the hemorrhoidal pile off the underlying sphincter and then using the Harmonic to cut and seal.  The internal hemorrhoid was elliptically excised.  It was then passed off the field. Anterior to that there was an internal hemorrhoid  that was at least a grade 2, I decided if I resected that with the Harmonic, then that would essentially have removed the entire right side of his anus and therefore, I decided to just band that internal hemorrhoid.  Two bands were placed at the base of the right anterior hemorrhoid in standard fashion and 2 bands were placed along the left lateral internal hemorrhoid in the standard fashion.  The right side was reinspected, there was excellent  hemostasis.  I then irrigated the canal with dilute Betadine.  I then injected 20 mL of Exparel in a regional fashion.  The patient was then placed in supine position, extubated, and brought to the recovery room after sterile bandages and mesh underwear had been placed.  All needle, instrument, sponge counts were correct x2.  There were no immediate complications.  The patient tolerated the procedure well.     Leighton Ruff. Redmond Pulling, MD, FACS     EMW/MEDQ  D:  10/01/2013  T:  10/02/2013  Job:  [id number]

## 2013-10-02 NOTE — Addendum Note (Signed)
Addendum created 10/02/13 1157 by Tawni Millers, CRNA   Modules edited: Charges VN

## 2013-10-03 ENCOUNTER — Telehealth (INDEPENDENT_AMBULATORY_CARE_PROVIDER_SITE_OTHER): Payer: Self-pay | Admitting: General Surgery

## 2013-10-03 NOTE — Telephone Encounter (Signed)
Called patient wife back and spoke to her about her husband bowel movements and she stated that he has a lot of lose stool and I told her to give him some fiber and she asked about the miralex she has been given it to him once a day, and I told her to do it every other day, and push the fluids. But still let his stool be soft and she stated that she will ,, no fever and patient is urning okay

## 2013-10-03 NOTE — Telephone Encounter (Signed)
Message copied by Maryclare Bean on Fri Oct 03, 2013 11:06 AM ------      Message from: Salvatore Marvel      Created: Fri Oct 03, 2013 10:12 AM      Regarding: Dr. Marga Melnick Questions      Contact: [phone]       Pt's wife Mark Howell called and has some questions about her husband Mark Howell surgery on 10/01/13, please call her.            Thank you. ------

## 2013-10-06 NOTE — Telephone Encounter (Signed)
i did use dissolvable sutures on 1 side so he may feel a knot or something like that - they will dissolve. He doesn't need to take flomax unless he is having urinating problems.

## 2013-10-06 NOTE — Telephone Encounter (Signed)
Contacted pt to inform him of Dr Dois Davenport message below. Pts wife verbalized understanding.

## 2013-10-21 ENCOUNTER — Encounter (INDEPENDENT_AMBULATORY_CARE_PROVIDER_SITE_OTHER): Payer: Self-pay

## 2013-10-23 ENCOUNTER — Ambulatory Visit (INDEPENDENT_AMBULATORY_CARE_PROVIDER_SITE_OTHER): Payer: Medicare Other | Admitting: General Surgery

## 2013-10-23 ENCOUNTER — Encounter (INDEPENDENT_AMBULATORY_CARE_PROVIDER_SITE_OTHER): Payer: Self-pay | Admitting: General Surgery

## 2013-10-23 VITALS — BP 123/74 | HR 79 | Temp 97.7°F | Resp 18 | Ht 73.5 in | Wt 177.0 lb

## 2013-10-23 DIAGNOSIS — Z09 Encounter for follow-up examination after completed treatment for conditions other than malignant neoplasm: Secondary | ICD-10-CM

## 2013-10-23 MED ORDER — TAMSULOSIN HCL 0.4 MG PO CAPS: 0.4000 mg | ORAL_CAPSULE | Freq: Every day | ORAL | Status: DC

## 2013-10-23 NOTE — Patient Instructions (Signed)
Pick up flomax at pharmacy See you in 6 weeks Keep up bowel regimen

## 2013-10-23 NOTE — Progress Notes (Signed)
Subjective:     Patient ID: Mark Howell, male   DOB: 03/21/1941, 73 y.o.   MRN: [id number]  HPI 73 year old Caucasian male comes in for his first appointment after going under exam anesthesia, excision of right posterior internal/external hemorrhoid, Banding of right anterior and left lateral internal hemorrhoids on June 3. He states that he has not some pain for the first few days and some constipation however he did quite well. He states that he feels excellent. He denies any pain with defecation. He had some bloody bowel movements the first few days and a little bit of anal leakage however that is all resolved. He denies any itching or burning with defecation now. He is having daily bowel movements. He wonders if he can have more Flomax as he thinks this is helping him urinate on a more frequent basis as well as making it easier for him to void.  Review of Systems     Objective:   Physical Exam BP 123/74  Pulse 79  Temp(Src) 97.7 F (36.5 C) (Temporal)  Resp 18  Ht 6' 1.5" (1.867 m)  Wt 177 lb (80.287 kg)  BMI 23.03 kg/m2 Alert, no apparent stress Rectum-visual inspection only. No cellulitis, induration or fluctuance. No thrombosed external hemorrhoids. No prolapsed internal hemorrhoids. No significant swelling of the anal verge. Digital rectal exam and anoscopy deferred    Assessment:     Exam under anesthesia, excision of a right posterior internal-external hemorrhoid, banding of right anterior and left lateral internal hemorrhoids     Plan:     Overall very pleased with how he is doing. Surprisingly he had minimal discomfort after surgery which is quite good. We discussed the ongoing importance of good bowel habits, drinking plenty of water, and fiber supplementation. However he had back in about 6 weeks at which time we'll do anoscopy to look on the inside of his rectum  His pathology showed simple hemorrhoidal tissue Leighton Ruff. Redmond Pulling, MD, FACS General, Bariatric, & Minimally  Invasive Surgery [facility], [place]

## 2013-11-05 ENCOUNTER — Encounter (HOSPITAL_BASED_OUTPATIENT_CLINIC_OR_DEPARTMENT_OTHER): Payer: Self-pay | Admitting: General Surgery

## 2013-11-20 ENCOUNTER — Other Ambulatory Visit (INDEPENDENT_AMBULATORY_CARE_PROVIDER_SITE_OTHER): Payer: Self-pay | Admitting: General Surgery

## 2013-11-20 NOTE — Telephone Encounter (Signed)
Can this patient have this Rx

## 2013-11-20 NOTE — Telephone Encounter (Signed)
Ok to refill with 2 additional refills

## 2013-12-08 ENCOUNTER — Encounter (INDEPENDENT_AMBULATORY_CARE_PROVIDER_SITE_OTHER): Payer: Self-pay | Admitting: General Surgery

## 2013-12-08 ENCOUNTER — Ambulatory Visit (INDEPENDENT_AMBULATORY_CARE_PROVIDER_SITE_OTHER): Payer: Medicare Other | Admitting: General Surgery

## 2013-12-08 VITALS — BP 126/74 | HR 75 | Temp 98.4°F | Ht 73.0 in | Wt 186.0 lb

## 2013-12-08 DIAGNOSIS — Z09 Encounter for follow-up examination after completed treatment for conditions other than malignant neoplasm: Secondary | ICD-10-CM

## 2013-12-08 NOTE — Patient Instructions (Signed)
Continue daily fiber Try using wet wipes or damp toilet paper to wipe with

## 2013-12-08 NOTE — Progress Notes (Signed)
Subjective:     Patient ID: Mark Howell, male   DOB: 01-01-1941, 73 y.o.   MRN: [id number]  HPI 73 year old Caucasian male comes in for followup after undergoing excisional hemorrhoidectomy as well as hemorrhoidal banding in June of this year. He states he is doing well. He denies any melena or hematochezia. He reports a good appetite. He denies any pain with defecation. He reports daily bowel movements. He is taking a fiber supplement daily. He is taking plenty of water. He may occasionally have some itching around his anus. He is using dry toilet paper  Review of Systems     Objective:   Physical Exam BP 126/74  Pulse 75  Temp(Src) 98.4 F (36.9 C)  Ht 6\' 1"  (1.854 m)  Wt 186 lb (84.369 kg)  BMI 24.55 kg/m2 Alert, no apparent distress, nontoxic Rectal-visual inspection reveals some redundant circumferential nonthrombosed hemorrhoidal tissue. No cellulitis, induration, or fluctuance. Digital rectal exam reveals excellent tone. Anoscopy reveals no significant internal hemorrhoidal burden. Old scar in the right lateral position    Assessment:     Status post exam under anesthesia, excisional hemorrhoidectomy, hemorrhoidal banding     Plan:     Overall I think he is doing quite well. We discussed the importance of ongoing daily fiber supplementation indefinitely. We discussed the importance of drinking plenty of water. With respect to the minor occasional itching and burning I recommended using wet wipes or damp toilet paper to clean himself with. F/u prn  Leighton Ruff. Redmond Pulling, MD, FACS General, Bariatric, & Minimally Invasive Surgery [facility]  Note: This dictation was prepared with Dragon/digital dictation along with [facility] technology. Any transcriptional errors that result from this process are unintentional.

## 2014-10-19 ENCOUNTER — Encounter (HOSPITAL_COMMUNITY): Payer: Self-pay

## 2014-10-19 ENCOUNTER — Emergency Department (HOSPITAL_COMMUNITY)
Admission: EM | Admit: 2014-10-19 | Discharge: 2014-10-19 | Disposition: A | Discharge status: Home / Self Care | Payer: Medicare Other | Attending: Emergency Medicine | Admitting: Emergency Medicine

## 2014-10-19 DIAGNOSIS — Z7952 Long term (current) use of systemic steroids: Secondary | ICD-10-CM | POA: Insufficient documentation

## 2014-10-19 DIAGNOSIS — K219 Gastro-esophageal reflux disease without esophagitis: Secondary | ICD-10-CM | POA: Insufficient documentation

## 2014-10-19 DIAGNOSIS — H9190 Unspecified hearing loss, unspecified ear: Secondary | ICD-10-CM | POA: Insufficient documentation

## 2014-10-19 DIAGNOSIS — Z7982 Long term (current) use of aspirin: Secondary | ICD-10-CM | POA: Diagnosis not present

## 2014-10-19 DIAGNOSIS — E785 Hyperlipidemia, unspecified: Secondary | ICD-10-CM | POA: Insufficient documentation

## 2014-10-19 DIAGNOSIS — M1990 Unspecified osteoarthritis, unspecified site: Secondary | ICD-10-CM | POA: Insufficient documentation

## 2014-10-19 DIAGNOSIS — Z720 Tobacco use: Secondary | ICD-10-CM | POA: Insufficient documentation

## 2014-10-19 DIAGNOSIS — K644 Residual hemorrhoidal skin tags: Secondary | ICD-10-CM | POA: Diagnosis not present

## 2014-10-19 DIAGNOSIS — I10 Essential (primary) hypertension: Secondary | ICD-10-CM | POA: Insufficient documentation

## 2014-10-19 DIAGNOSIS — K625 Hemorrhage of anus and rectum: Secondary | ICD-10-CM | POA: Diagnosis not present

## 2014-10-19 DIAGNOSIS — Z79899 Other long term (current) drug therapy: Secondary | ICD-10-CM | POA: Insufficient documentation

## 2014-10-19 LAB — COMPREHENSIVE METABOLIC PANEL
ALT: 20 U/L (ref 17–63)
AST: 29 U/L (ref 15–41)
Albumin: 4.3 g/dL (ref 3.5–5.0)
Alkaline Phosphatase: 55 U/L (ref 38–126)
Anion gap: 11 (ref 5–15)
BILIRUBIN TOTAL: 0.8 mg/dL (ref 0.3–1.2)
BUN: 16 mg/dL (ref 6–20)
CALCIUM: 9.1 mg/dL (ref 8.9–10.3)
CHLORIDE: 103 mmol/L (ref 101–111)
CO2: 22 mmol/L (ref 22–32)
CREATININE: 1.02 mg/dL (ref 0.61–1.24)
GFR calc non Af Amer: >60 mL/min (ref >60)
GLUCOSE: 141 mg/dL — AB (ref 65–99)
Potassium: 4.2 mmol/L (ref 3.5–5.1)
SODIUM: 136 mmol/L (ref 135–145)
Total Protein: 7.3 g/dL (ref 6.5–8.1)

## 2014-10-19 LAB — CBC
HCT: 42.8 % (ref 39.0–52.0)
Hemoglobin: 15.2 g/dL (ref 13.0–17.0)
MCH: 33.2 pg (ref 26.0–34.0)
MCHC: 35.5 g/dL (ref 30.0–36.0)
MCV: 93.4 fL (ref 78.0–100.0)
Platelets: 128 10*3/uL — ABNORMAL LOW (ref 150–400)
RBC: 4.58 MIL/uL (ref 4.22–5.81)
RDW: 12.6 % (ref 11.5–15.5)
WBC: 8.8 10*3/uL (ref 4.0–10.5)

## 2014-10-19 LAB — POC OCCULT BLOOD, ED: Fecal Occult Bld: POSITIVE — AB

## 2014-10-19 NOTE — ED Notes (Signed)
Pt c/o rectal bleeding x 3 episodes starting 3 days ago.  Denies pain.  Pt reports bright red blood only w/ BMs.  Hx of hemorrhoid surgery last year.

## 2014-10-19 NOTE — ED Provider Notes (Signed)
CSN: [id number]     Arrival date & time 10/19/14  0941 History   First MD Initiated Contact with Patient 10/19/14 1157     Chief Complaint  Patient presents with  . Rectal Bleeding     (Consider location/radiation/quality/duration/timing/severity/associated sxs/prior Treatment) Patient is a 74 y.o. male presenting with hematochezia. The history is provided by the patient.  Rectal Bleeding Quality: dark red. Amount:  Moderate Duration:  3 days Timing:  Intermittent Progression:  Unchanged Chronicity:  New Context: spontaneously   Similar prior episodes: no   Relieved by:  Nothing Worsened by:  Nothing tried Ineffective treatments:  None tried Associated symptoms: no abdominal pain, no fever and no vomiting     Past Medical History  Diagnosis Date  . Arthritis   . GERD (gastroesophageal reflux disease)   . HTN (hypertension)   . Hyperlipidemia   . Complication of anesthesia     problems voiding post op  . Psoriatic arthritis   . Wears glasses   . Full dentures   . Snores   . HOH (hard of hearing)    Past Surgical History  Procedure Laterality Date  . Inguinal hernia repair  01/04/2012    Procedure: LAPAROSCOPIC INGUINAL HERNIA;  Surgeon: Gayland Curry, MD,FACS;  Location: [facility];  Service: General;  Laterality: Right;  . Colonoscopy    . Hemorrhoid surgery N/A 10/01/2013    Procedure: EXAM UNDER ANESTHESIA  AND EXCISIONAL HEMORRHOIDECTOMY WITH HEMORRHOID BANDING X 2;  Surgeon: Gayland Curry, MD;  Location: [place];  Service: General;  Laterality: N/A;   Family History  Problem Relation Age of Onset  . Stroke Father   . Diabetes Father   . Heart disease Father   . Cancer Brother     pancreatic   History  Substance Use Topics  . Smoking status: Current Every Day Smoker -- 0.50 packs/day for 50 years    Types: Cigarettes  . Smokeless tobacco: Never Used  . Alcohol Use: Yes     Comment: occassionally    Review of Systems  Constitutional:  Negative for fever.  HENT: Negative for drooling and rhinorrhea.   Eyes: Negative for pain.  Respiratory: Negative for cough and shortness of breath.   Cardiovascular: Negative for chest pain and leg swelling.  Gastrointestinal: Positive for hematochezia. Negative for nausea, vomiting, abdominal pain and diarrhea.  Genitourinary: Negative for dysuria and hematuria.       Rectal bleeding  Musculoskeletal: Negative for gait problem and neck pain.  Skin: Negative for color change.  Neurological: Negative for numbness and headaches.  Hematological: Negative for adenopathy.  Psychiatric/Behavioral: Negative for behavioral problems.  All other systems reviewed and are negative.     Allergies  Hydrocodone and Oxycodone  Home Medications   Prior to Admission medications   Medication Sig Start Date End Date Taking? Authorizing Provider  acetaminophen (TYLENOL) 650 MG CR tablet Take 650-1,300 mg by mouth 2 (two) times daily as needed for pain.   Yes Historical Provider, MD  aspirin EC 81 MG tablet Take 81 mg by mouth daily.   Yes Historical Provider, MD  Calcium Carbonate-Vitamin D (CALCIUM 600 + D PO) Take 1 tablet by mouth 2 (two) times daily.   Yes Historical Provider, MD  diphenhydramine-acetaminophen (TYLENOL PM) 25-500 MG TABS Take 1 tablet by mouth at bedtime as needed. For sleep   Yes Historical Provider, MD  folic acid (FOLVITE) 993 MCG tablet Take 400 mcg by mouth daily.   Yes Historical  Provider, MD  Magnesium 250 MG TABS Take 250 mg by mouth daily.   Yes Historical Provider, MD  Multiple Vitamin (MULITIVITAMIN WITH MINERALS) TABS Take 1 tablet by mouth daily.   Yes Historical Provider, MD  OVER THE COUNTER MEDICATION Take 1 tablet by mouth 2 (two) times daily. Relief- healthy joint function   Yes Historical Provider, MD  pantoprazole (PROTONIX) 40 MG tablet Take 40 mg by mouth every other day.    Yes Historical Provider, MD  polyethylene glycol (MIRALAX / GLYCOLAX) packet Take 17  g by mouth at bedtime.   Yes Historical Provider, MD  pravastatin (PRAVACHOL) 40 MG tablet Take 40 mg by mouth daily.  10/17/13  Yes Historical Provider, MD  predniSONE (DELTASONE) 10 MG tablet Take 5 mg by mouth daily with breakfast.   Yes Historical Provider, MD  tamsulosin (FLOMAX) 0.4 MG CAPS capsule TAKE (1) CAPSULE DAILY, START 4 DAYS BEFORE PROCEDURE Patient taking differently: take 1 capsule by mouth every morning 11/20/13  Yes Greer Pickerel, MD  ENBREL SURECLICK 50 MG/ML injection Inject 50 mg into the skin once a week. Tuesday 10/05/14   Historical Provider, MD   BP 178/88 mmHg  Pulse 79  Temp(Src) 98 F (36.7 C) (Oral)  Resp 16  SpO2 98% Physical Exam  Constitutional: He is oriented to person, place, and time. He appears well-developed and well-nourished.  HENT:  Head: Normocephalic and atraumatic.  Right Ear: External ear normal.  Left Ear: External ear normal.  Nose: Nose normal.  Mouth/Throat: Oropharynx is clear and moist. No oropharyngeal exudate.  Eyes: Conjunctivae and EOM are normal. Pupils are equal, round, and reactive to light.  Neck: Normal range of motion. Neck supple.  Cardiovascular: Normal rate, regular rhythm, normal heart sounds and intact distal pulses.  Exam reveals no gallop and no friction rub.   No murmur heard. Pulmonary/Chest: Effort normal and breath sounds normal. No respiratory distress. He has no wheezes.  Abdominal: Soft. Bowel sounds are normal. He exhibits no distension. There is no tenderness. There is no rebound and no guarding.  Genitourinary:  Mild external hemorrhoid which is not irritated or thrombosed.  Pinkish appearing stool. Otherwise normal rectal exam.  Musculoskeletal: Normal range of motion. He exhibits no edema or tenderness.  Neurological: He is alert and oriented to person, place, and time.  Skin: Skin is warm and dry.  Psychiatric: He has a normal mood and affect. His behavior is normal.  Nursing note and vitals  reviewed.   ED Course  Procedures (including critical care time) Labs Review Labs Reviewed  CBC - Abnormal; Notable for the following:    Platelets 128 (*)    All other components within normal limits  COMPREHENSIVE METABOLIC PANEL - Abnormal; Notable for the following:    Glucose, Bld 141 (*)    All other components within normal limits  POC OCCULT BLOOD, ED - Abnormal; Notable for the following:    Fecal Occult Bld POSITIVE (*)    All other components within normal limits    Imaging Review No results found.   EKG Interpretation None      MDM   Final diagnoses:  Rectal bleeding    12:23 PM 74 y.o. male with history of hypertension who presents with bloody stools. He notes that 3 days ago he saw a bloody stool but over the weekend had normal bowel movements. He again saw a dark bloody stool this morning. He denies any pain. Vital signs unremarkable here. He is not on any  blood thinners. We'll get screening lab work.  1:12 PM: VSS. I interpreted/reviewed the labs and/or imaging which were non-contributory.  Reviewed colonoscopy from 1 year ago which showed internal and external hemorrhoids. He is status post hemorrhoid surgery. I suspect his bleeding is likely from internal hemorrhoids given the otherwise noncontributory colonoscopy one year ago. I have discussed the diagnosis/risks/treatment options with the patient and believe the pt to be eligible for discharge home to follow-up with his GI Doctor, Dr. Watt Climes. We also discussed returning to the ED immediately if new or worsening sx occur. We discussed the sx which are most concerning (e.g., sob, fatigue, worsening bleeding) that necessitate immediate return. Medications administered to the patient during their visit and any new prescriptions provided to the patient are listed below.  Medications given during this visit Medications - No data to display  New Prescriptions   No medications on file     Pamella Pert,  MD 10/19/14 1313

## 2014-10-19 NOTE — Discharge Instructions (Signed)
Bloody Stools  Bloody stools often mean that there is a problem in the digestive tract. Your caregiver may use the term "melena" to describe black, tarry, and bad smelling stools or "hematochezia" to describe red or maroon-colored stools. Blood seen in the stool can be caused by bleeding anywhere along the intestinal tract.   A black stool usually means that blood is coming from the upper part of the gastrointestinal tract (esophagus, stomach, or small bowel). Passing maroon-colored stools or bright red blood usually means that blood is coming from lower down in the large bowel or the rectum. However, sometimes massive bleeding in the stomach or small intestine can cause bright red bloody stools.   Consuming black licorice, lead, iron pills, medicines containing bismuth subsalicylate, or blueberries can also cause black stools. Your caregiver can test black stools to see if blood is present.  It is important that the cause of the bleeding be found. Treatment can then be started, and the problem can be corrected. Rectal bleeding may not be serious, but you should not assume everything is okay until you know the cause. It is very important to follow up with your caregiver or a specialist in gastrointestinal problems.  CAUSES   Blood in the stools can come from various underlying causes. Often, the cause is not found during your first visit. Testing is often needed to discover the cause of bleeding in the gastrointestinal tract. Causes range from simple to serious or even life-threatening. Possible causes include:  · Hemorrhoids. These are veins that are full of blood (engorged) in the rectum. They cause pain, inflammation, and may bleed.  · Anal fissures. These are areas of painful tearing which may bleed. They are often caused by passing hard stool.  · Diverticulosis. These are pouches that form on the colon over time, with age, and may bleed significantly.  · Diverticulitis. This is inflammation in areas with  diverticulosis. It can cause pain, fever, and bloody stools, although bleeding is rare.  · Proctitis and colitis. These are inflamed areas of the rectum or colon. They may cause pain, fever, and bloody stools.  · Polyps and cancer. Colon cancer is a leading cause of preventable cancer death. It often starts out as precancerous polyps that can be removed during a colonoscopy, preventing progression into cancer. Sometimes, polyps and cancer may cause rectal bleeding.  · Gastritis and ulcers. Bleeding from the upper gastrointestinal tract (near the stomach) may travel through the intestines and produce black, sometimes tarry, often bad smelling stools. In certain cases, if the bleeding is fast enough, the stools may not be black, but red and the condition may be life-threatening.  SYMPTOMS   You may have stools that are bright red and bloody, that are normal color with blood on them, or that are dark black and tarry. In some cases, you may only have blood in the toilet bowl. Any of these cases need medical care. You may also have:  · Pain at the anus or anywhere in the rectum.  · Lightheadedness or feeling faint.  · Extreme weakness.  · Nausea or vomiting.  · Fever.  DIAGNOSIS  Your caregiver may use the following methods to find the cause of your bleeding:  · Taking a medical history. Age is important. Older people tend to develop polyps and cancer more often. If there is anal pain and a hard, large stool associated with bleeding, a tear of the anus may be the cause. If blood drips into the toilet after a bowel movement, bleeding hemorrhoids may be the   problem. The color and frequency of the bleeding are additional considerations. In most cases, the medical history provides clues, but seldom the final answer.  · A visual and finger (digital) exam. Your caregiver will inspect the anal area, looking for tears and hemorrhoids. A finger exam can provide information when there is tenderness or a growth inside. In men, the  prostate is also examined.  · Endoscopy. Several types of small, long scopes (endoscopes) are used to view the colon.  ¨ In the office, your caregiver may use a rigid, or more commonly, a flexible viewing sigmoidoscope. This exam is called flexible sigmoidoscopy. It is performed in 5 to 10 minutes.  ¨ A more thorough exam is accomplished with a colonoscope. It allows your caregiver to view the entire 5 to 6 foot long colon. Medicine to help you relax (sedative) is usually given for this exam. Frequently, a bleeding lesion may be present beyond the reach of the sigmoidoscope. So, a colonoscopy may be the best exam to start with. Both exams are usually done on an outpatient basis. This means the patient does not stay overnight in the hospital or surgery center.  ¨ An upper endoscopy may be needed to examine your stomach. Sedation is used and a flexible endoscope is put in your mouth, down to your stomach.  · A barium enema X-ray. This is an X-ray exam. It uses liquid barium inserted by enema into the rectum. This test alone may not identify an actual bleeding point. X-rays highlight abnormal shadows, such as those made by lumps (tumors), diverticuli, or colitis.  TREATMENT   Treatment depends on the cause of your bleeding.   · For bleeding from the stomach or colon, the caregiver doing your endoscopy or colonoscopy may be able to stop the bleeding as part of the procedure.  · Inflammation or infection of the colon can be treated with medicines.  · Many rectal problems can be treated with creams, suppositories, or warm baths.  · Surgery is sometimes needed.  · Blood transfusions are sometimes needed if you have lost a lot of blood.  · For any bleeding problem, let your caregiver know if you take aspirin or other blood thinners regularly.  HOME CARE INSTRUCTIONS   · Take any medicines exactly as prescribed.  · Keep your stools soft by eating a diet high in fiber. Prunes (1 to 3 a day) work well for many people.  · Drink  enough water and fluids to keep your urine clear or pale yellow.  · Take sitz baths if advised. A sitz bath is when you sit in a bathtub with warm water for 10 to 15 minutes to soak, soothe, and cleanse the rectal area.  · If enemas or suppositories are advised, be sure you know how to use them. Tell your caregiver if you have problems with this.  · Monitor your bowel movements to look for signs of improvement or worsening.  SEEK MEDICAL CARE IF:   · You do not improve in the time expected.  · Your condition worsens after initial improvement.  · You develop any new symptoms.  SEEK IMMEDIATE MEDICAL CARE IF:   · You develop severe or prolonged rectal bleeding.  · You vomit blood.  · You feel weak or faint.  · You have a fever.  MAKE SURE YOU:  · Understand these instructions.  · Will watch your condition.  · Will get help right away if you are not doing well or get worse.    Document Released: 04/07/2002 Document Revised: 07/10/2011 Document Reviewed: 09/02/2010  [employer]® Patient Information ©2015 [employer]. This information is not intended to replace advice given to you by your health care provider. Make sure you discuss any questions you have with your health care provider.

## 2015-11-26 ENCOUNTER — Other Ambulatory Visit: Payer: Self-pay | Admitting: Gastroenterology

## 2015-11-26 DIAGNOSIS — R634 Abnormal weight loss: Secondary | ICD-10-CM

## 2015-11-26 DIAGNOSIS — R197 Diarrhea, unspecified: Secondary | ICD-10-CM

## 2015-12-03 ENCOUNTER — Ambulatory Visit
Admission: RE | Admit: 2015-12-03 | Discharge: 2015-12-03 | Disposition: A | Discharge status: Home / Self Care | Payer: Medicare Other | Source: Ambulatory Visit | Attending: Gastroenterology | Admitting: Gastroenterology

## 2015-12-03 ENCOUNTER — Ambulatory Visit
Admission: RE | Admit: 2015-12-03 | Discharge: 2015-12-03 | Disposition: A | Discharge status: Home / Self Care | Payer: Medicare Other | Source: Ambulatory Visit | Attending: Rheumatology | Admitting: Rheumatology

## 2015-12-03 ENCOUNTER — Other Ambulatory Visit: Payer: Self-pay | Admitting: Rheumatology

## 2015-12-03 DIAGNOSIS — R634 Abnormal weight loss: Secondary | ICD-10-CM

## 2015-12-03 DIAGNOSIS — R0602 Shortness of breath: Secondary | ICD-10-CM

## 2015-12-03 DIAGNOSIS — F17200 Nicotine dependence, unspecified, uncomplicated: Secondary | ICD-10-CM

## 2015-12-03 DIAGNOSIS — R197 Diarrhea, unspecified: Secondary | ICD-10-CM

## 2015-12-03 MED ORDER — IOPAMIDOL (ISOVUE-300) INJECTION 61%
100.0000 mL | Freq: Once | INTRAVENOUS | Status: AC | PRN
  Administered 2015-12-03: 100 mL via INTRAVENOUS

## 2015-12-06 ENCOUNTER — Other Ambulatory Visit: Payer: Self-pay | Admitting: Rheumatology

## 2015-12-06 DIAGNOSIS — R0602 Shortness of breath: Secondary | ICD-10-CM

## 2015-12-06 DIAGNOSIS — J449 Chronic obstructive pulmonary disease, unspecified: Secondary | ICD-10-CM

## 2015-12-06 DIAGNOSIS — R634 Abnormal weight loss: Secondary | ICD-10-CM

## 2015-12-17 ENCOUNTER — Ambulatory Visit
Admission: RE | Admit: 2015-12-17 | Discharge: 2015-12-17 | Disposition: A | Discharge status: Home / Self Care | Payer: Medicare Other | Source: Ambulatory Visit | Attending: Rheumatology | Admitting: Rheumatology

## 2015-12-17 DIAGNOSIS — J449 Chronic obstructive pulmonary disease, unspecified: Secondary | ICD-10-CM

## 2015-12-17 DIAGNOSIS — R634 Abnormal weight loss: Secondary | ICD-10-CM

## 2015-12-17 DIAGNOSIS — R0602 Shortness of breath: Secondary | ICD-10-CM

## 2015-12-17 MED ORDER — IOPAMIDOL (ISOVUE-300) INJECTION 61%
75.0000 mL | Freq: Once | INTRAVENOUS | Status: AC | PRN
Start: 2015-12-17 — End: 2015-12-17
  Administered 2015-12-17: 75 mL via INTRAVENOUS

## 2015-12-20 ENCOUNTER — Other Ambulatory Visit (HOSPITAL_COMMUNITY): Payer: Self-pay | Admitting: Rheumatology

## 2015-12-20 DIAGNOSIS — R918 Other nonspecific abnormal finding of lung field: Secondary | ICD-10-CM

## 2015-12-23 ENCOUNTER — Telehealth: Payer: Self-pay | Admitting: *Deleted

## 2015-12-23 NOTE — Telephone Encounter (Signed)
Oncology Nurse Navigator Documentation  Oncology Nurse Navigator Flowsheets 12/23/2015  Navigator Encounter Type Introductory phone call;Telephone/I received referral on Mark Howell.  I called and spoke with him on his appt for [facility] 12/30/15.  He verbalized understanding of appt time and place.   Telephone Outgoing Call  Patient Visit Type MedOnc  Treatment Phase Pre-Tx/Tx Discussion  Barriers/Navigation Needs Coordination of Care  Interventions Coordination of Care  Coordination of Care Appts  Acuity Level 1  Acuity Level 1 Initial guidance, education and coordination as needed  Time Spent with Patient 15

## 2015-12-24 ENCOUNTER — Telehealth: Payer: Self-pay | Admitting: *Deleted

## 2015-12-24 NOTE — Telephone Encounter (Signed)
Mailed new pt clinic letter to pt.  

## 2015-12-27 ENCOUNTER — Ambulatory Visit (HOSPITAL_COMMUNITY)
Admission: RE | Admit: 2015-12-27 | Discharge: 2015-12-27 | Disposition: A | Discharge status: Home / Self Care | Payer: Medicare Other | Source: Ambulatory Visit | Attending: Rheumatology | Admitting: Rheumatology

## 2015-12-27 DIAGNOSIS — N200 Calculus of kidney: Secondary | ICD-10-CM | POA: Diagnosis not present

## 2015-12-27 DIAGNOSIS — K5730 Diverticulosis of large intestine without perforation or abscess without bleeding: Secondary | ICD-10-CM | POA: Insufficient documentation

## 2015-12-27 DIAGNOSIS — R918 Other nonspecific abnormal finding of lung field: Secondary | ICD-10-CM | POA: Diagnosis not present

## 2015-12-27 LAB — GLUCOSE, CAPILLARY: Glucose-Capillary: 114 mg/dL — ABNORMAL HIGH (ref 65–99)

## 2015-12-27 MED ORDER — FLUDEOXYGLUCOSE F - 18 (FDG) INJECTION
8.4600 | Freq: Once | INTRAVENOUS | Status: AC | PRN
  Administered 2015-12-27: 8.46 via INTRAVENOUS

## 2015-12-30 ENCOUNTER — Institutional Professional Consult (permissible substitution) (INDEPENDENT_AMBULATORY_CARE_PROVIDER_SITE_OTHER): Payer: Medicare Other | Admitting: Cardiothoracic Surgery

## 2015-12-30 ENCOUNTER — Other Ambulatory Visit: Payer: Self-pay | Admitting: *Deleted

## 2015-12-30 ENCOUNTER — Ambulatory Visit (HOSPITAL_BASED_OUTPATIENT_CLINIC_OR_DEPARTMENT_OTHER): Payer: Medicare Other | Admitting: Internal Medicine

## 2015-12-30 ENCOUNTER — Encounter: Payer: Self-pay | Admitting: Internal Medicine

## 2015-12-30 ENCOUNTER — Ambulatory Visit: Payer: Medicare Other | Attending: Internal Medicine | Admitting: Physical Therapy

## 2015-12-30 DIAGNOSIS — R918 Other nonspecific abnormal finding of lung field: Secondary | ICD-10-CM

## 2015-12-30 DIAGNOSIS — R2689 Other abnormalities of gait and mobility: Secondary | ICD-10-CM | POA: Insufficient documentation

## 2015-12-30 DIAGNOSIS — R293 Abnormal posture: Secondary | ICD-10-CM | POA: Diagnosis present

## 2015-12-30 DIAGNOSIS — R599 Enlarged lymph nodes, unspecified: Secondary | ICD-10-CM

## 2015-12-30 DIAGNOSIS — R197 Diarrhea, unspecified: Secondary | ICD-10-CM | POA: Diagnosis not present

## 2015-12-30 HISTORY — DX: Other nonspecific abnormal finding of lung field: R91.8

## 2015-12-30 NOTE — Progress Notes (Signed)
[place] Telephone:([phone]) [phone]   [phone] Multidisciplinary thoracic oncology clinic  CONSULT NOTE  REFERRING PHYSICIAN: Dr. Hurley Cisco  REASON FOR CONSULTATION:  75 years old white male with questionable lung cancer.  HPI Mark Howell is a 75 y.o. male with past medical history significant for arthritic psoriasis as well as long history of smoking. The patient has been complaining of weight loss, shortness breath as well as frequent diarrhea recently. He was seen by his gastroenterologist Dr. Watt Climes but no improvement in his diarrhea. Because of his weight loss the patient had CT scan of the abdomen and pelvis as well as chest x-ray performed on 12/03/2015. The chest x-ray showed interstitial changes in the left lung which are nonspecific but CT scan of the chest was recommended. CT scan of the abdomen and pelvis showed descending and sigmoid colon diverticulosis without definite findings of acute diverticulitis but there was no abdominal/pelvic mass lesions or adenopathy. There was also bilateral renal calculi but no obstructing ureteral calculi or bladder calculi. CT scan of the chest with contrast was performed on 12/17/2015 and it showed bilateral pulmonary nodules with evidence of right hilar adenopathy. Malignancy is felt to be likely. The right hilar adenopathy measured 1.5 x 2.1 cm. In the right lung there are scattered nodules identified the largest of which is within the right middle lobe and measured 6 mm. There are also to dominant nodules in the left lung measuring 1.1 x 1.4 cm in greatest dimension and superior to it there was a nodular density measuring 1.4 x 1.6 cm. The patient had a PET scan on 12/27/2015 and it showed hypermetabolic right hilar mass worrisome for central bronchogenic carcinoma or metastatic disease to the right hilar lymph nodes. No other suspicious metabolic activity and the small nodules in the right middle lobe demonstrate  no abnormal metabolic activity. The left lung findings are favored to be post inflammatory. There was no evidence of metastatic disease within the abdomen or pelvis. Dr. Charlestine Night kindly referred the patient to the multidisciplinary thoracic oncology clinic today for further evaluation and recommendation regarding these abnormalities. When seen today the patient continues to have several episodes of diarrhea and he currently takes Imodium an as-needed basis. He has no chest pain, shortness breath, cough or hemoptysis. He lost around 15 pounds in the last 4 months. He has no nausea or vomiting. He has no headache or visual changes. Family history significant for mother who died at age 8, father died from a stroke and brother died from pancreatic cancer as well as brother with lung cancer. The patient is married and has 2 children. He was accompanied today by his wife, Mark Howell and his daughter Mark Howell with the pharmacist in [place], [place]. 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 He has a history of smoking 1 pack per day for around 50 years and quit smoking one month ago. He has no history of alcohol or drug abuse.  HPI  Past Medical History:  Diagnosis Date  . Arthritis   . Complication of anesthesia    problems voiding post op  . Full dentures   . GERD (gastroesophageal reflux disease)   . HOH (hard of hearing)   . HTN (hypertension)   . Hyperlipidemia   . Lung mass 12/30/2015  . Psoriatic arthritis ([place])   . Snores   . Wears glasses     Past Surgical History:  Procedure Laterality Date  . COLONOSCOPY    .  HEMORRHOID SURGERY N/A 10/01/2013   Procedure: EXAM UNDER ANESTHESIA  AND EXCISIONAL HEMORRHOIDECTOMY WITH HEMORRHOID BANDING X 2;  Surgeon: Gayland Curry, MD;  Location: [facility];  Service: General;  Laterality: N/A;  . INGUINAL HERNIA REPAIR  01/04/2012   Procedure: LAPAROSCOPIC INGUINAL HERNIA;  Surgeon: Gayland Curry, MD,FACS;  Location:  [facility];  Service: General;  Laterality: Right;    Family History  Problem Relation Age of Onset  . Stroke Father   . Diabetes Father   . Heart disease Father   . Cancer Brother     pancreatic    Social History Social History  Substance Use Topics  . Smoking status: Current Every Day Smoker    Packs/day: 0.50    Years: 50.00    Types: Cigarettes  . Smokeless tobacco: Never Used  . Alcohol use Yes     Comment: occassionally    Allergies  Allergen Reactions  . Hydrocodone Rash  . Oxycodone Rash    Current Outpatient Prescriptions  Medication Sig Dispense Refill  . acetaminophen (TYLENOL) 650 MG CR tablet Take 650-1,300 mg by mouth 2 (two) times daily as needed for pain.    Marland Kitchen aspirin EC 81 MG tablet Take 81 mg by mouth daily.    . Calcium Carbonate-Vitamin D (CALCIUM 600 + D PO) Take 1 tablet by mouth 2 (two) times daily.    . diphenhydramine-acetaminophen (TYLENOL PM) 25-500 MG TABS Take 1 tablet by mouth at bedtime as needed. For sleep    . ENBREL SURECLICK 50 MG/ML injection Inject 50 mg into the skin once a week. Tuesday    . folic acid (FOLVITE) 106 MCG tablet Take 400 mcg by mouth daily.    . Magnesium 250 MG TABS Take 250 mg by mouth daily.    . Multiple Vitamin (MULITIVITAMIN WITH MINERALS) TABS Take 1 tablet by mouth daily.    Marland Kitchen OVER THE COUNTER MEDICATION Take 1 tablet by mouth 2 (two) times daily. Relief- healthy joint function    . pantoprazole (PROTONIX) 40 MG tablet Take 40 mg by mouth every other day.     . polyethylene glycol (MIRALAX / GLYCOLAX) packet Take 17 g by mouth at bedtime.    . pravastatin (PRAVACHOL) 40 MG tablet Take 40 mg by mouth daily.     . predniSONE (DELTASONE) 10 MG tablet Take 5 mg by mouth daily with breakfast.    . tamsulosin (FLOMAX) 0.4 MG CAPS capsule TAKE (1) CAPSULE DAILY, START 4 DAYS BEFORE PROCEDURE (Patient taking differently: take 1 capsule by mouth every morning) 30 capsule 2   No current facility-administered medications  for this visit.     Review of Systems  Constitutional: positive for fatigue and weight loss Eyes: negative Ears, nose, mouth, throat, and face: negative Respiratory: negative Cardiovascular: negative Gastrointestinal: positive for diarrhea Genitourinary:negative Integument/breast: negative Hematologic/lymphatic: negative Musculoskeletal:negative Neurological: negative Behavioral/Psych: positive for anxiety Endocrine: negative Allergic/Immunologic: negative  Physical Exam  YIR:SWNIO, healthy, no distress, well nourished, well developed and anxious SKIN: skin color, texture, turgor are normal, no rashes or significant lesions HEAD: Normocephalic, No masses, lesions, tenderness or abnormalities EYES: normal, PERRLA, Conjunctiva are pink and non-injected EARS: External ears normal, Canals clear OROPHARYNX:no exudate, no erythema and lips, buccal mucosa, and tongue normal  NECK: supple, no adenopathy, no JVD LYMPH:  no palpable lymphadenopathy, no hepatosplenomegaly LUNGS: clear to auscultation , and palpation HEART: regular rate & rhythm, no murmurs and no gallops ABDOMEN:abdomen soft, non-tender, normal bowel sounds and no  masses or organomegaly BACK: Back symmetric, no curvature., No CVA tenderness EXTREMITIES:no joint deformities, effusion, or inflammation, no edema, no skin discoloration  NEURO: alert & oriented x 3 with fluent speech, no focal motor/sensory deficits  PERFORMANCE STATUS: ECOG 1  LABORATORY DATA: Lab Results  Component Value Date   WBC 8.8 10/19/2014   HGB 15.2 10/19/2014   HCT 42.8 10/19/2014   MCV 93.4 10/19/2014   PLT 128 (L) 10/19/2014      Chemistry      Component Value Date/Time   NA 136 10/19/2014 1006   K 4.2 10/19/2014 1006   CL 103 10/19/2014 1006   CO2 22 10/19/2014 1006   BUN 16 10/19/2014 1006   CREATININE 1.02 10/19/2014 1006      Component Value Date/Time   CALCIUM 9.1 10/19/2014 1006   ALKPHOS 55 10/19/2014 1006   AST 29  10/19/2014 1006   ALT 20 10/19/2014 1006   BILITOT 0.8 10/19/2014 1006       RADIOGRAPHIC STUDIES: Dg Chest 2 View  Result Date: 12/03/2015 CLINICAL DATA:  Long-term smoker, discontinued 1 week ago, currently asymptomatic. The patient reports frequent diarrhea and a 12 pound weight loss over the past 6 months EXAM: CHEST  2 VIEW COMPARISON:  None in PACs FINDINGS: The right lung is well-expanded and clear. On the left there is patchy interstitial density in the lower lobe and peripherally in the inferior aspect of the upper lobe. The heart is normal in size. The pulmonary vascularity is not engorged. There is calcification in the wall of the aortic arch. There is mild multilevel degenerative disc disease of the thoracic spine. IMPRESSION: Interstitial changes in the left lung which are nonspecific and could be acute or chronic. Underlying COPD. Given the patient's clinical history, CT scanning of the chest would be useful to exclude occult malignancy. Electronically Signed   By: David  Martinique M.D.   On: 12/03/2015 08:36   Ct Chest W Contrast  Result Date: 12/17/2015 CLINICAL DATA:  Abnormal chest x-ray, history of tobacco use EXAM: CT CHEST WITH CONTRAST TECHNIQUE: Multidetector CT imaging of the chest was performed during intravenous contrast administration. CONTRAST:  25m ISOVUE-300 IOPAMIDOL (ISOVUE-300) INJECTION 61% COMPARISON:  12/03/2015 FINDINGS: Cardiovascular: The thoracic aorta shows calcific changes without aneurysmal dilatation or dissection. The pulmonary artery as visualized shows no evidence of pulmonary emboli. Coronary calcifications are noted. The cardiac structures are otherwise within normal limits. Mediastinum/Nodes: The thoracic inlet is within normal limits. No significant mediastinal adenopathy is seen. Right hilar adenopathy is identified measuring 15 by 21 mm in greatest dimension. Lungs/Pleura: The lungs are well aerated bilaterally and demonstrate some mild emphysematous  changes. In the right lung there are scattered nodules identified the largest of which lies within the right middle lobe measuring 6 mm. A few smaller nodules are seen. The left lung also shows nodular areas. There are 2 dominant nodules which appears somewhat linked by a intervening soft tissue. The more inferior of these is noted on image number 106 of series 5 and measures approximately 11 by 14 mm in greatest dimension. Superior to this and interconnected by intervening soft tissue is an area of nodular density measuring at least 14 by 16 mm. Upper Abdomen: No acute abnormality noted. Musculoskeletal: Degenerative changes of the thoracic spine IMPRESSION: Bilateral pulmonary nodules with evidence of right hilar adenopathy. Neoplasm is felt to be highly likely. PET-CT may be helpful for further evaluation. These results will be called to the ordering clinician or representative by  the Radiologist Assistant, and communication documented in the PACS or zVision Dashboard. Electronically Signed   By: Inez Catalina M.D.   On: 12/17/2015 12:02   Ct Abdomen Pelvis W Contrast  Result Date: 12/03/2015 CLINICAL DATA:  Diarrhea and weight loss. EXAM: CT ABDOMEN AND PELVIS WITH CONTRAST TECHNIQUE: Multidetector CT imaging of the abdomen and pelvis was performed using the standard protocol following bolus administration of intravenous contrast. CONTRAST:  135m ISOVUE-300 IOPAMIDOL (ISOVUE-300) INJECTION 61% COMPARISON:  None. FINDINGS: Lower chest: No acute pulmonary findings. Bibasilar scarring changes. No infiltrates or effusions. No worrisome pulmonary lesions. The heart is normal in size. No pericardial effusion. No coronary artery calcifications. The distal esophagus is grossly normal. Hepatobiliary: No focal hepatic lesions or intrahepatic biliary dilatation. The gallbladder is normal. No common bile duct dilatation. Pancreas: No mass, inflammation or ductal dilatation. Spleen: Normal size.  No focal lesions.  Adrenals/Urinary Tract: The adrenal glands are normal. Bilateral renal calculi and renal scarring changes. No hydronephrosis or obstructing ureteral calculi. Small renal cysts are noted. No worrisome renal lesions. No significant collecting system abnormalities on the delayed images. 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 Stomach/Bowel: The stomach, duodenum, small-bowel and colon or unremarkable. No inflammatory changes, mass lesions or obstructive findings. The terminal ileum is normal. The appendix is normal. There is diffuse distal descending and sigmoid diverticulosis and muscular wall thickening but no definite findings for acute diverticulitis. No mass or obstruction. Vascular/Lymphatic: Moderate atherosclerotic calcifications involving the aorta and branch vessel ostia. No aneurysm or dissection. Other: Enlarged prostate gland with median lobe hypertrophy impressing on the base the bladder. The bladder wall is mildly thickened and this is likely due to partial bladder outlet obstruction. No bladder mass. The seminal vesicles are unremarkable. No pelvic mass or adenopathy. No free pelvic fluid collections. No inguinal mass or adenopathy. Slightly dilated vessels in the left spermatic cord could suggest varicocele. Musculoskeletal: No significant bony findings. Degenerative changes noted in the spine head hips. IMPRESSION: 1. Bilateral renal calculi but no obstructing ureteral calculi or bladder calculi. 2. Renal scarring changes and small cysts. 3. Descending and sigmoid colon diverticulosis without definite findings for acute diverticulitis. 4. No abdominal/pelvic mass lesions or lymphadenopathy. 5. Moderate atherosclerotic changes involving the aorta and branch vessels. 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 Electronically Signed   By: PMarijo SanesM.D.   On: 12/03/2015 10:01   Nm Pet Image Initial (pi) Skull Base To Thigh  Result Date: 12/27/2015 CLINICAL DATA:   Initial treatment strategy for bilateral pulmonary nodules on CT. EXAM: NUCLEAR MEDICINE PET SKULL BASE TO THIGH TECHNIQUE: 8.46 mCi F-18 FDG was injected intravenously. Full-ring PET imaging was performed from the skull base to thigh after the radiotracer. CT data was obtained and used for attenuation correction and anatomic localization. FASTING BLOOD GLUCOSE:  Value: 114 Mg/dl COMPARISON:  Abdominal pelvic CT 12/03/2015.  Chest CT 12/17/2015. FINDINGS: NECK No hypermetabolic cervical lymph nodes are identified.There are no lesions of the pharyngeal mucosal space. Chronic atherosclerosis noted. CHEST Approximately 2.8 cm right hilar mass is hypermetabolic with an SUV max of 13.7. No other hypermetabolic mediastinal, hilar or axillary lymph nodes are seen. There are no hypermetabolic pulmonary nodules. Specifically, no abnormal activity is seen associated with the 6 mm right middle lobe nodule on image 44 or the 9 x 5 mm right middle lobe nodule on image 46. These nodules are suboptimally evaluated based on size. The irregular left lung densities demonstrate no  abnormal metabolic activity, favored to reflect postinflammatory scarring based on morphology. Moderate emphysematous changes are present. ABDOMEN/PELVIS There is no hypermetabolic activity within the liver, adrenal glands, spleen or pancreas. There is no hypermetabolic nodal activity. The liver demonstrates mildly decreased density suspicious for steatosis. There are bilateral nonobstructing renal calculi and cortical scarring in both kidneys. There are diverticular changes within the sigmoid colon, enlargement of the prostate gland and diffuse bladder wall thickening. There is aortic and branch vessel atherosclerosis. SKELETON There is no hypermetabolic activity to suggest osseous metastatic disease. IMPRESSION: 1. Hypermetabolic right hilar mass worrisome for central bronchogenic carcinoma or metastatic disease to right hilar lymph nodes. Bronchoscopy  suggested for tissue sampling. 2. No other suspicious metabolic activity. The small nodules in the right middle lobe demonstrate no abnormal metabolic activity, although are not optimally evaluated based on size. The left lung findings are favored to be postinflammatory. 3. No evidence of metastatic disease within the abdomen or pelvis. 4. Bilateral nephrolithiasis and diverticulosis of the distal colon. Electronically Signed   By: Richardean Sale M.D.   On: 12/27/2015 14:31    ASSESSMENT: This is a very pleasant 75 years old white male with highly suspicious of stage IIA (T1a, N1, M0) lung cancer presented with right middle lobe small pulmonary nodule in addition to right hilar adenopathy.   PLAN: I had a lengthy discussion with the patient and his family today about his current disease stage, prognosis and treatment options. I recommended for the patient to complete the staging workup by ordering a MRI of the brain to rule out brain metastasis. The patient has no tissue diagnosis and I recommended for him to see Dr. Servando Snare later today for consideration of bronchoscopy and biopsy of the right hilar mass/lymph node for tissue diagnosis. The patient may be a good surgical candidate for resection but this will be dependent on his pulmonary function and evaluation by Dr. Servando Snare. If he is not a good surgical candidate, we will consider him for a course of concurrent chemoradiation. This will be discussed in details with the patient after his tissue diagnosis and evaluation by Dr. Servando Snare. I will arrange for the patient a follow-up appointment at that time. The patient was seen during the multidisciplinary thoracic oncology clinic today by medical oncology, thoracic surgeon, thoracic navigator and physical therapist. He was advised to call immediately if he has any concerning symptoms in the interval. For the diarrhea, the patient will continue on Imodium for now and he will reconsult with Dr. Watt Climes  for any other intervention. The patient voices understanding of current disease status and treatment options and is in agreement with the current care plan.  All questions were answered. The patient knows to call the clinic with any problems, questions or concerns. We can certainly see the patient much sooner if necessary.  Thank you so much for allowing me to participate in the care of KOSTANTINOS TALLMAN. I will continue to follow up the patient with you and assist in his care.  I spent 40 minutes counseling the patient face to face. The total time spent in the appointment was 60 minutes.  Disclaimer: This note was dictated with voice recognition software. Similar sounding words can inadvertently be transcribed and may not be corrected upon review.   Rheagan Nayak K. December 30, 2015, 3:53 PM

## 2015-12-30 NOTE — Progress Notes (Signed)
[address]       [place] [address]             [phone]                    Mark Howell [facility] Medical Record #[id number] Date of Birth: 20-Apr-1941  Referring: Mark Cisco, MD Primary Care: Mark Mustache, MD  Chief Complaint:    Chief Complaint  Patient presents with  . Lung Mass    History of Present Illness:    Mark Howell 75 y.o. male is seen in the office  today for abnormal ct of chest and PET scan. Patient long term smoker more then 50 years , he qui tone month ago. He has noted weight  loss and diarrhea . A chest xray, which was abnormal.       Current Activity/ Functional Status:  Patient is independent with mobility/ambulation, transfers, ADL's, IADL's.   Zubrod Score: At the time of surgery this patient's most appropriate activity status/level should be described as: '[x]'$     0    Normal activity, no symptoms '[]'$     1    Restricted in physical strenuous activity but ambulatory, able to do out light work '[]'$     2    Ambulatory and capable of self care, unable to do work activities, up and about               >50 % of waking hours                              '[]'$     3    Only limited self care, in bed greater than 50% of waking hours '[]'$     4    Completely disabled, no self care, confined to bed or chair '[]'$     5    Moribund   Past Medical History:  Diagnosis Date  . Arthritis   . Complication of anesthesia    problems voiding post op  . Full dentures   . GERD (gastroesophageal reflux disease)   . HOH (hard of hearing)   . HTN (hypertension)   . Hyperlipidemia   . Lung mass 12/30/2015  . Psoriatic arthritis ([place])   . Snores   . Wears glasses     Past Surgical History:  Procedure Laterality Date  . COLONOSCOPY    . HEMORRHOID SURGERY N/A 10/01/2013   Procedure: EXAM UNDER ANESTHESIA  AND EXCISIONAL HEMORRHOIDECTOMY WITH HEMORRHOID BANDING X 2;  Surgeon: Mark Curry, MD;  Location: [facility];   Service: General;  Laterality: N/A;  . INGUINAL HERNIA REPAIR  01/04/2012   Procedure: LAPAROSCOPIC INGUINAL HERNIA;  Surgeon: Mark Curry, MD,FACS;  Location: [facility];  Service: General;  Laterality: Right;    Family History  Problem Relation Age of Onset  . Stroke Father   . Diabetes Father   . Heart disease Father   . Cancer Brother     pancreatic    Social History   Social History  . Marital status: Married    Spouse name: N/A  . Number of children: N/A  . Years of education: N/A   Occupational History  . Not on file.   Social History Main Topics  . Smoking status: Current Every Day Smoker    Packs/day: 0.50    Years: 50.00    Types: Cigarettes  . Smokeless  tobacco: Never Used  . Alcohol use Yes     Comment: occassionally  . Drug use: No  . Sexual activity: Not on file      History  Smoking Status  . Current Every Day Smoker  . Packs/day: 0.50  . Years: 50.00  . Types: Cigarettes  Smokeless Tobacco  . Never Used    History  Alcohol Use  . Yes    Comment: occassionally     Allergies  Allergen Reactions  . Hydrocodone Rash  . Oxycodone Rash    Current Outpatient Prescriptions  Medication Sig Dispense Refill  . acetaminophen (TYLENOL) 650 MG CR tablet Take 650-1,300 mg by mouth 2 (two) times daily as needed for pain.    Marland Kitchen aspirin EC 81 MG tablet Take 81 mg by mouth at bedtime.     . diphenhydramine-acetaminophen (TYLENOL PM) 25-500 MG TABS Take 1 tablet by mouth at bedtime. For sleep    . ENBREL SURECLICK 50 MG/ML injection Inject 50 mg into the skin once a week. Tuesday    . Multiple Vitamin (MULITIVITAMIN WITH MINERALS) TABS Take 1 tablet by mouth daily.    Marland Kitchen OVER THE COUNTER MEDICATION Take 1 tablet by mouth 2 (two) times daily. Relief- healthy joint function    . pantoprazole (PROTONIX) 40 MG tablet Take 40 mg by mouth every other day.     . pravastatin (PRAVACHOL) 40 MG tablet Take 40 mg by mouth daily.     . predniSONE (DELTASONE) 10 MG  tablet Take 5 mg by mouth every other day.     . tamsulosin (FLOMAX) 0.4 MG CAPS capsule TAKE (1) CAPSULE DAILY, START 4 DAYS BEFORE PROCEDURE (Patient taking differently: take 1 capsule by mouth every morning) 30 capsule 2   No current facility-administered medications for this visit.       Review of Systems:     Cardiac Review of Systems: Y or N  Chest Pain [n    ]  Resting SOB [ n  ] Exertional SOB  [ n ]  Orthopnea [  ]   Pedal Edema [   ]    Palpitations [  ] Syncope  [  ]   Presyncope [n   ]  General Review of Systems: [Y] = yes [  ]=no Constitional: recent weight change [ y ];  Wt loss over the last 3 months [   ] anorexia [  ]; fatigue [  ]; nausea [  ]; night sweats [  ]; fever [  ]; or chills [  ];          Dental: poor dentition[  ]; Last Dentist visit:   Eye : blurred vision [  ]; diplopia [   ]; vision changes [  ];  Amaurosis fugax[  ]; Resp: cough [  ];  wheezing[  ];  hemoptysis[  ]; shortness of breath[  ]; paroxysmal nocturnal dyspnea[  ]; dyspnea on exertion[  ]; or orthopnea[  ];  GI:  gallstones[  ], vomiting[  ];  dysphagia[  ]; melena[  ];  hematochezia [  ]; heartburn[  ];   Hx of  Colonoscopy[  ]; GU: kidney stones [  ]; hematuria[  ];   dysuria [  ];  nocturia[  ];  history of     obstruction [  ]; urinary frequency [  ]             Skin: rash, swelling[  ];, hair loss[  ];  peripheral edema[  ];  or itching[  ]; Musculosketetal: myalgias[  ];  joint swelling[  ];  joint erythema[  ];  joint pain[  ];  back pain[  ];  Heme/Lymph: bruising[  ];  bleeding[  ];  anemia[  ];  Neuro: TIA[n  ];  headaches[  ];  stroke[  ];  vertigo[  ];  seizures[  ];   paresthesias[  ];  difficulty walking[  ];  Psych:depression[  ]; anxiety[  ];  Endocrine: diabetes[  ];  thyroid dysfunction[  ];  Immunizations: Flu up to date [  ]; Pneumococcal up to date [  ];  Other:  Physical Exam: There were no vitals taken for this visit.  PHYSICAL EXAMINATION: General appearance: alert,  cooperative and appears stated age Head: Normocephalic, without obvious abnormality, atraumatic Neck: no adenopathy, no carotid bruit, no JVD, supple, symmetrical, trachea midline and thyroid not enlarged, symmetric, no tenderness/mass/nodules Lymph nodes: Cervical, supraclavicular, and axillary nodes normal. Resp: clear to auscultation bilaterally Back: symmetric, no curvature. ROM normal. No CVA tenderness. Cardio: regular rate and rhythm, S1, S2 normal, no murmur, click, rub or gallop GI: soft, non-tender; bowel sounds normal; no masses,  no organomegaly Extremities: extremities normal, atraumatic, no cyanosis or edema Neurologic: Grossly normal  Diagnostic Studies & Laboratory data:     Recent Radiology Findings:   Dg Chest 2 View  Result Date: 12/03/2015 CLINICAL DATA:  Long-term smoker, discontinued 1 week ago, currently asymptomatic. The patient reports frequent diarrhea and a 12 pound weight loss over the past 6 months EXAM: CHEST  2 VIEW COMPARISON:  None in PACs FINDINGS: The right lung is well-expanded and clear. On the left there is patchy interstitial density in the lower lobe and peripherally in the inferior aspect of the upper lobe. The heart is normal in size. The pulmonary vascularity is not engorged. There is calcification in the wall of the aortic arch. There is mild multilevel degenerative disc disease of the thoracic spine. IMPRESSION: Interstitial changes in the left lung which are nonspecific and could be acute or chronic. Underlying COPD. Given the patient's clinical history, CT scanning of the chest would be useful to exclude occult malignancy. Electronically Signed   By: David  Martinique M.D.   On: 12/03/2015 08:36   Ct Chest W Contrast  Result Date: 12/17/2015 CLINICAL DATA:  Abnormal chest x-ray, history of tobacco use EXAM: CT CHEST WITH CONTRAST TECHNIQUE: Multidetector CT imaging of the chest was performed during intravenous contrast administration. CONTRAST:  44m  ISOVUE-300 IOPAMIDOL (ISOVUE-300) INJECTION 61% COMPARISON:  12/03/2015 FINDINGS: Cardiovascular: The thoracic aorta shows calcific changes without aneurysmal dilatation or dissection. The pulmonary artery as visualized shows no evidence of pulmonary emboli. Coronary calcifications are noted. The cardiac structures are otherwise within normal limits. Mediastinum/Nodes: The thoracic inlet is within normal limits. No significant mediastinal adenopathy is seen. Right hilar adenopathy is identified measuring 15 by 21 mm in greatest dimension. Lungs/Pleura: The lungs are well aerated bilaterally and demonstrate some mild emphysematous changes. In the right lung there are scattered nodules identified the largest of which lies within the right middle lobe measuring 6 mm. A few smaller nodules are seen. The left lung also shows nodular areas. There are 2 dominant nodules which appears somewhat linked by a intervening soft tissue. The more inferior of these is noted on image number 106 of series 5 and measures approximately 11 by 14 mm in greatest dimension. Superior to this and interconnected by intervening soft tissue is an  area of nodular density measuring at least 14 by 16 mm. Upper Abdomen: No acute abnormality noted. Musculoskeletal: Degenerative changes of the thoracic spine IMPRESSION: Bilateral pulmonary nodules with evidence of right hilar adenopathy. Neoplasm is felt to be highly likely. PET-CT may be helpful for further evaluation. These results will be called to the ordering clinician or representative by the Radiologist Assistant, and communication documented in the PACS or zVision Dashboard. Electronically Signed   By: Inez Catalina M.D.   On: 12/17/2015 12:02   Ct Abdomen Pelvis W Contrast  Result Date: 12/03/2015 CLINICAL DATA:  Diarrhea and weight loss. EXAM: CT ABDOMEN AND PELVIS WITH CONTRAST TECHNIQUE: Multidetector CT imaging of the abdomen and pelvis was performed using the standard protocol  following bolus administration of intravenous contrast. CONTRAST:  119m ISOVUE-300 IOPAMIDOL (ISOVUE-300) INJECTION 61% COMPARISON:  None. FINDINGS: Lower chest: No acute pulmonary findings. Bibasilar scarring changes. No infiltrates or effusions. No worrisome pulmonary lesions. The heart is normal in size. No pericardial effusion. No coronary artery calcifications. The distal esophagus is grossly normal. Hepatobiliary: No focal hepatic lesions or intrahepatic biliary dilatation. The gallbladder is normal. No common bile duct dilatation. Pancreas: No mass, inflammation or ductal dilatation. Spleen: Normal size.  No focal lesions. Adrenals/Urinary Tract: The adrenal glands are normal. Bilateral renal calculi and renal scarring changes. No hydronephrosis or obstructing ureteral calculi. Small renal cysts are noted. No worrisome renal lesions. No significant collecting system abnormalities on the delayed images. 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 Stomach/Bowel: The stomach, duodenum, small-bowel and colon or unremarkable. No inflammatory changes, mass lesions or obstructive findings. The terminal ileum is normal. The appendix is normal. There is diffuse distal descending and sigmoid diverticulosis and muscular wall thickening but no definite findings for acute diverticulitis. No mass or obstruction. Vascular/Lymphatic: Moderate atherosclerotic calcifications involving the aorta and branch vessel ostia. No aneurysm or dissection. Other: Enlarged prostate gland with median lobe hypertrophy impressing on the base the bladder. The bladder wall is mildly thickened and this is likely due to partial bladder outlet obstruction. No bladder mass. The seminal vesicles are unremarkable. No pelvic mass or adenopathy. No free pelvic fluid collections. No inguinal mass or adenopathy. Slightly dilated vessels in the left spermatic cord could suggest varicocele. Musculoskeletal: No significant bony findings.  Degenerative changes noted in the spine head hips. IMPRESSION: 1. ureteral calculi or bladder calculi. 2. Renal scarring changes and small cysts. 3. Descending and sigmoid colon diverticulosis without definite findings for acute diverticulitis. 4. No abdominal/pelvic mass lesions or lymphadenopathy. 5. Moderate atherosclerotic changes involving the aorta and branch vessels. 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 Electronically Signed   By: PMarijo SanesM.D.   On: 12/03/2015 10:01   Nm Pet Image Initial (pi) Skull Base To Thigh  Result Date: 12/27/2015 CLINICAL DATA:  Initial treatment strategy for bilateral pulmonary nodules on CT. EXAM: NUCLEAR MEDICINE PET SKULL BASE TO THIGH TECHNIQUE: 8.46 mCi F-18 FDG was injected intravenously. Full-ring PET imaging was performed from the skull base to thigh after the radiotracer. CT data was obtained and used for attenuation correction and anatomic localization. FASTING BLOOD GLUCOSE:  Value: 114 Mg/dl COMPARISON:  Abdominal pelvic CT 12/03/2015.  Chest CT 12/17/2015. FINDINGS: NECK No hypermetabolic cervical lymph nodes are identified.There are no lesions of the pharyngeal mucosal space. Chronic atherosclerosis noted. CHEST Approximately 2.8 cm right hilar mass is hypermetabolic with an SUV max of 13.7. No other hypermetabolic mediastinal, hilar or axillary lymph nodes  are seen. There are no hypermetabolic pulmonary nodules. Specifically, no abnormal activity is seen associated with the 6 mm right middle lobe nodule on image 44 or the 9 x 5 mm right middle lobe nodule on image 46. These nodules are suboptimally evaluated based on size. The irregular left lung densities demonstrate no abnormal metabolic activity, favored to reflect postinflammatory scarring based on morphology. Moderate emphysematous changes are present. ABDOMEN/PELVIS There is no hypermetabolic activity within the liver, adrenal glands, spleen or pancreas. There is no hypermetabolic  nodal activity. The liver demonstrates mildly decreased density suspicious for steatosis. There are bilateral nonobstructing renal calculi and cortical scarring in both kidneys. There are diverticular changes within the sigmoid colon, enlargement of the prostate gland and diffuse bladder wall thickening. There is aortic and branch vessel atherosclerosis. SKELETON There is no hypermetabolic activity to suggest osseous metastatic disease. IMPRESSION: 1. Hypermetabolic right hilar mass worrisome for central bronchogenic carcinoma or metastatic disease to right hilar lymph nodes. Bronchoscopy suggested for tissue sampling. 2. No other suspicious metabolic activity. The small nodules in the right middle lobe demonstrate no abnormal metabolic activity, although are not optimally evaluated based on size. The left lung findings are favored to be postinflammatory. 3. No evidence of metastatic disease within the abdomen or pelvis. 4. Bilateral nephrolithiasis and diverticulosis of the distal colon. Electronically Signed   By: Richardean Sale M.D.   On: 12/27/2015 14:31     I have independently reviewed the above radiologic studies.  Recent Lab Findings: Lab Results  Component Value Date   WBC 8.8 10/19/2014   HGB 15.2 10/19/2014   HCT 42.8 10/19/2014   PLT 128 (L) 10/19/2014   GLUCOSE 141 (H) 10/19/2014   ALT 20 10/19/2014   AST 29 10/19/2014   NA 136 10/19/2014   K 4.2 10/19/2014   CL 103 10/19/2014   CREATININE 1.02 10/19/2014   BUN 16 10/19/2014   CO2 22 10/19/2014   INR 1.03 01/27/2013      Assessment / Plan:   1/ right hilar mass suggestive  for central bronchogenic carcinoma or metastatic disease to right hilar lymph nodes. 2/Bilateral nephrolithiasis  3/ diverticulosis of the distal colon.    I have discussed with patient his wife and daughter findings and recommended proceeding with bronchoscopy and EBUS with biopsy. Patient is agreeable.   I  spent 40 minutes counseling the patient  face to face and 50% or more the  time was spent in counseling and coordination of care. The total time spent in the appointment was 60 minutes.  Grace Isaac MD      [place] [place] [address] Office [phone]   Beeper [phone]  12/30/2015 8:11 PM

## 2015-12-30 NOTE — Therapy (Signed)
[place], [place], [address] Phone: [phone]   Fax:  [phone]  Physical Therapy Evaluation  Patient Details  Name: Mark Howell MRN: [id number] Date of Birth: May 12, 1940 Referring Provider: Dr. Curt Bears  Encounter Date: 12/30/2015      PT End of Session - 12/30/15 1704    Visit Number 1   Number of Visits 1   PT Start Time 4008   PT Stop Time 1612   PT Time Calculation (min) 22 min   Activity Tolerance Patient tolerated treatment well   Behavior During Therapy [facility] for tasks assessed/performed      Past Medical History:  Diagnosis Date  . Arthritis   . Complication of anesthesia    problems voiding post op  . Full dentures   . GERD (gastroesophageal reflux disease)   . HOH (hard of hearing)   . HTN (hypertension)   . Hyperlipidemia   . Lung mass 12/30/2015  . Psoriatic arthritis ([place])   . Snores   . Wears glasses     Past Surgical History:  Procedure Laterality Date  . COLONOSCOPY    . HEMORRHOID SURGERY N/A 10/01/2013   Procedure: EXAM UNDER ANESTHESIA  AND EXCISIONAL HEMORRHOIDECTOMY WITH HEMORRHOID BANDING X 2;  Surgeon: Gayland Curry, MD;  Location: [facility];  Service: General;  Laterality: N/A;  . INGUINAL HERNIA REPAIR  01/04/2012   Procedure: LAPAROSCOPIC INGUINAL HERNIA;  Surgeon: Gayland Curry, MD,FACS;  Location: [facility];  Service: General;  Laterality: Right;    There were no vitals filed for this visit.       Subjective Assessment - 12/30/15 1650    Subjective Patient reports he is staying active with gardening and yardwork.   Patient is accompained by: Family member  wife and daughter   Pertinent History Patient with right hilar mass measuring 2.8 cm plus scattered nodules; currently no pathology. Current plan is to obtain biopsy then likely chemoradiation, possibly a pneumonectomy. Patient is a current smoker with history of arthritis,  hypertension, and hyperlipidemia.    Patient Stated Goals to obtain information from lung multidisciplinary clinic providers   Currently in Pain? No/denies            [facility] PT Assessment - 12/30/15 0001      Assessment   Medical Diagnosis right hilar mass   Referring Provider Dr. Curt Bears     Precautions   Precautions Other (comment)   Precaution Comments cancer precautions     Restrictions   Weight Bearing Restrictions No     Balance Screen   Has the patient fallen in the past 6 months No   Has the patient had a decrease in activity level because of a fear of falling?  No   Is the patient reluctant to leave their home because of a fear of falling?  No     Home Environment   Living Environment Private residence   Living Arrangements Spouse/significant other   Available Help at Discharge Family   Type of [place] to enter   Entrance Stairs-Number of Steps 2   [place] One level     Prior Function   Level of Independence Independent   Vocation Self employed   Vocation Requirements heavy lifting required   Leisure does gardening and yardwork, but no regular exercise     Cognition   Overall Cognitive Status Within Functional Limits for tasks assessed  Functional Tests   Functional tests Sit to Stand     Sit to Stand   Comments performed 9 repetitions during 30 second sit to stand     Posture/Postural Control   Posture Comments forward head and rounded shoulders     ROM / Strength   AROM / PROM / Strength AROM     AROM   Overall AROM Comments overall standing trunk AROM within functional limits for flexion, extension, and bilateral rotation; bilateral trunk sidebending AROM 25% limited     Ambulation/Gait   Ambulation/Gait Yes   Ambulation/Gait Assistance 7: Independent     Balance   Balance Assessed Yes     Dynamic Standing Balance   Dynamic Standing - Comments able to reach 6 inches during standing Functional Reach  test, below average for age                           PT Education - 12/30/15 1703    Education provided Yes   Education Details energy conservation, walking program, Cure article, posture, deep breathing, cough splinting, PT information brochure   Person(s) Educated Patient;Spouse;Child(ren)   Methods Explanation;Handout   Comprehension Verbalized understanding               Lung Clinic Goals - 12/30/15 1710      Patient will be able to verbalize understanding of the benefit of exercise to decrease fatigue.   Status Achieved     Patient will be able to verbalize the importance of posture.   Status Achieved     Patient will be able to demonstrate diaphragmatic breathing for improved lung function.   Status Achieved     Patient will be able to verbalize understanding of the role of physical therapy to prevent functional decline and who to contact if physical therapy is needed.   Status Achieved             Plan - 12/30/15 1705    Clinical Impression Statement Patient is a 75 year old male with a right hilar mass of currently unknown pathology. Patient demonstrates decreased standing balance with standing Functional Reach and decreased repetitions during 30 second sit to stand. He was educated on starting a regular exercise program for improved fatigue during his cancer treatment. He was instructed to contact his doctor for a PT referral should he have any therapy needs in the future.   Rehab Potential Good   Clinical Impairments Affecting Rehab Potential recent cancer diagnosis   PT Next Visit Plan Patient currently has no therapy needs      Patient will benefit from skilled therapeutic intervention in order to improve the following deficits and impairments:  Decreased balance, Decreased endurance  Visit Diagnosis: No diagnosis found.     Problem List Patient Active Problem List   Diagnosis Date Noted  . Lung mass 12/30/2015  . Snoring  03/19/2012    Mellody Life, SPT 12/30/2015, 5:10 PM  [place], [place], [address] Phone: [phone]   Fax:  [phone]  Name: Mark Howell MRN: [id number] Date of Birth: 1940/10/24  This entire session was guided, instructed, and directly supervised by Serafina Royals, PT.  Read, reviewed, edited and agree with student's findings and recommendations.   Serafina Royals, PT 12/30/15 5:52 PM

## 2015-12-31 ENCOUNTER — Encounter (HOSPITAL_COMMUNITY): Payer: Self-pay | Admitting: *Deleted

## 2015-12-31 ENCOUNTER — Encounter (HOSPITAL_COMMUNITY)
Admission: RE | Disposition: A | Discharge status: Home / Self Care | Payer: Self-pay | Source: Ambulatory Visit | Attending: Cardiothoracic Surgery

## 2015-12-31 ENCOUNTER — Ambulatory Visit (HOSPITAL_COMMUNITY): Payer: Medicare Other | Admitting: Certified Registered"

## 2015-12-31 ENCOUNTER — Ambulatory Visit (HOSPITAL_COMMUNITY)
Admission: RE | Admit: 2015-12-31 | Discharge: 2015-12-31 | Disposition: A | Discharge status: Home / Self Care | Payer: Medicare Other | Source: Ambulatory Visit | Attending: Cardiothoracic Surgery | Admitting: Cardiothoracic Surgery

## 2015-12-31 ENCOUNTER — Telehealth: Payer: Self-pay | Admitting: *Deleted

## 2015-12-31 DIAGNOSIS — L4050 Arthropathic psoriasis, unspecified: Secondary | ICD-10-CM | POA: Diagnosis not present

## 2015-12-31 DIAGNOSIS — C3401 Malignant neoplasm of right main bronchus: Secondary | ICD-10-CM | POA: Diagnosis not present

## 2015-12-31 DIAGNOSIS — K219 Gastro-esophageal reflux disease without esophagitis: Secondary | ICD-10-CM | POA: Diagnosis not present

## 2015-12-31 DIAGNOSIS — R918 Other nonspecific abnormal finding of lung field: Secondary | ICD-10-CM | POA: Diagnosis present

## 2015-12-31 DIAGNOSIS — E785 Hyperlipidemia, unspecified: Secondary | ICD-10-CM | POA: Diagnosis not present

## 2015-12-31 DIAGNOSIS — I10 Essential (primary) hypertension: Secondary | ICD-10-CM | POA: Insufficient documentation

## 2015-12-31 DIAGNOSIS — R222 Localized swelling, mass and lump, trunk: Secondary | ICD-10-CM | POA: Diagnosis not present

## 2015-12-31 HISTORY — PX: VIDEO BRONCHOSCOPY WITH ENDOBRONCHIAL ULTRASOUND: SHX6177

## 2015-12-31 LAB — COMPREHENSIVE METABOLIC PANEL
ALT: 14 U/L — ABNORMAL LOW (ref 17–63)
AST: 23 U/L (ref 15–41)
Albumin: 4.1 g/dL (ref 3.5–5.0)
Alkaline Phosphatase: 74 U/L (ref 38–126)
Anion gap: 9 (ref 5–15)
BUN: 12 mg/dL (ref 6–20)
CO2: 25 mmol/L (ref 22–32)
Calcium: 9.5 mg/dL (ref 8.9–10.3)
Chloride: 103 mmol/L (ref 101–111)
Creatinine, Ser: 1.09 mg/dL (ref 0.61–1.24)
GFR calc Af Amer: >60 mL/min (ref >60)
GFR calc non Af Amer: >60 mL/min (ref >60)
Glucose, Bld: 116 mg/dL — ABNORMAL HIGH (ref 65–99)
Potassium: 3.8 mmol/L (ref 3.5–5.1)
Sodium: 137 mmol/L (ref 135–145)
Total Bilirubin: 0.3 mg/dL (ref 0.3–1.2)
Total Protein: 7.6 g/dL (ref 6.5–8.1)

## 2015-12-31 LAB — CBC
HCT: 45 % (ref 39.0–52.0)
Hemoglobin: 15.2 g/dL (ref 13.0–17.0)
MCH: 31 pg (ref 26.0–34.0)
MCHC: 33.8 g/dL (ref 30.0–36.0)
MCV: 91.6 fL (ref 78.0–100.0)
Platelets: 134 10*3/uL — ABNORMAL LOW (ref 150–400)
RBC: 4.91 MIL/uL (ref 4.22–5.81)
RDW: 12.9 % (ref 11.5–15.5)
WBC: 8.8 10*3/uL (ref 4.0–10.5)

## 2015-12-31 LAB — PROTIME-INR
INR: 1.06
Prothrombin Time: 13.8 seconds (ref 11.4–15.2)

## 2015-12-31 LAB — APTT: aPTT: 36 seconds (ref 24–36)

## 2015-12-31 SURGERY — BRONCHOSCOPY, WITH EBUS
Anesthesia: General

## 2015-12-31 MED ORDER — FENTANYL CITRATE (PF) 100 MCG/2ML IJ SOLN
INTRAMUSCULAR | Status: DC | PRN
  Administered 2015-12-31: 100 ug via INTRAVENOUS
  Administered 2015-12-31: 50 ug via INTRAVENOUS
  Administered 2015-12-31: 100 ug via INTRAVENOUS

## 2015-12-31 MED ORDER — PROPOFOL 10 MG/ML IV BOLUS
INTRAVENOUS | Status: DC | PRN
  Administered 2015-12-31: 50 mg via INTRAVENOUS
  Administered 2015-12-31: 150 mg via INTRAVENOUS
  Administered 2015-12-31: 50 mg via INTRAVENOUS

## 2015-12-31 MED ORDER — LIDOCAINE HCL (CARDIAC) 20 MG/ML IV SOLN
INTRAVENOUS | Status: DC | PRN
  Administered 2015-12-31: 100 mg via INTRAVENOUS

## 2015-12-31 MED ORDER — DEXTROSE 5 % IV SOLN
1.5000 g | INTRAVENOUS | Status: AC
  Administered 2015-12-31: 1.5 g via INTRAVENOUS
  Filled 2015-12-31: qty 1.5

## 2015-12-31 MED ORDER — FENTANYL CITRATE (PF) 100 MCG/2ML IJ SOLN: 25.0000 ug | INTRAMUSCULAR | Status: DC | PRN

## 2015-12-31 MED ORDER — EPINEPHRINE HCL 1 MG/ML IJ SOLN
INTRAMUSCULAR | Status: AC
  Filled 2015-12-31: qty 1

## 2015-12-31 MED ORDER — PHENYLEPHRINE HCL 10 MG/ML IJ SOLN
INTRAMUSCULAR | Status: DC | PRN
  Administered 2015-12-31: 80 ug via INTRAVENOUS

## 2015-12-31 MED ORDER — ROCURONIUM BROMIDE 100 MG/10ML IV SOLN
INTRAVENOUS | Status: DC | PRN
Start: 2015-12-31 — End: 2015-12-31
  Administered 2015-12-31: 70 mg via INTRAVENOUS

## 2015-12-31 MED ORDER — ONDANSETRON HCL 4 MG/2ML IJ SOLN
INTRAMUSCULAR | Status: DC | PRN
  Administered 2015-12-31: 4 mg via INTRAVENOUS

## 2015-12-31 MED ORDER — LACTATED RINGERS IV SOLN
INTRAVENOUS | Status: DC
  Administered 2015-12-31 (×2): via INTRAVENOUS

## 2015-12-31 MED ORDER — PHENYLEPHRINE 40 MCG/ML (10ML) SYRINGE FOR IV PUSH (FOR BLOOD PRESSURE SUPPORT)
PREFILLED_SYRINGE | INTRAVENOUS | Status: AC
  Filled 2015-12-31: qty 10

## 2015-12-31 MED ORDER — ONDANSETRON HCL 4 MG/2ML IJ SOLN
INTRAMUSCULAR | Status: AC
  Filled 2015-12-31: qty 2

## 2015-12-31 MED ORDER — MEPERIDINE HCL 25 MG/ML IJ SOLN: 6.2500 mg | INTRAMUSCULAR | Status: DC | PRN

## 2015-12-31 MED ORDER — PROPOFOL 10 MG/ML IV BOLUS
INTRAVENOUS | Status: AC
  Filled 2015-12-31: qty 20

## 2015-12-31 MED ORDER — METOCLOPRAMIDE HCL 5 MG/ML IJ SOLN: 10.0000 mg | Freq: Once | INTRAMUSCULAR | Status: DC | PRN

## 2015-12-31 MED ORDER — EPINEPHRINE HCL 1 MG/ML IJ SOLN
INTRAMUSCULAR | Status: DC | PRN
Start: 2015-12-31 — End: 2015-12-31
  Administered 2015-12-31: 1 mg

## 2015-12-31 MED ORDER — SUGAMMADEX SODIUM 200 MG/2ML IV SOLN
INTRAVENOUS | Status: AC
  Filled 2015-12-31: qty 2

## 2015-12-31 MED ORDER — ESMOLOL HCL 100 MG/10ML IV SOLN
INTRAVENOUS | Status: DC | PRN
  Administered 2015-12-31: 10 mg via INTRAVENOUS
  Administered 2015-12-31: 20 mg via INTRAVENOUS

## 2015-12-31 MED ORDER — ROCURONIUM BROMIDE 10 MG/ML (PF) SYRINGE
PREFILLED_SYRINGE | INTRAVENOUS | Status: AC
  Filled 2015-12-31: qty 10

## 2015-12-31 MED ORDER — 0.9 % SODIUM CHLORIDE (POUR BTL) OPTIME
TOPICAL | Status: DC | PRN
  Administered 2015-12-31: 1000 mL

## 2015-12-31 MED ORDER — EPHEDRINE 5 MG/ML INJ
INTRAVENOUS | Status: AC
  Filled 2015-12-31: qty 10

## 2015-12-31 MED ORDER — SUGAMMADEX SODIUM 200 MG/2ML IV SOLN
INTRAVENOUS | Status: DC | PRN
  Administered 2015-12-31: 160 mg via INTRAVENOUS

## 2015-12-31 MED ORDER — FENTANYL CITRATE (PF) 250 MCG/5ML IJ SOLN
INTRAMUSCULAR | Status: AC
  Filled 2015-12-31: qty 5

## 2015-12-31 SURGICAL SUPPLY — 26 items
BRUSH CYTOL CELLEBRITY 1.5X140 (MISCELLANEOUS) IMPLANT
CANISTER SUCTION 2500CC (MISCELLANEOUS) ×2 IMPLANT
CONT SPEC 4OZ CLIKSEAL STRL BL (MISCELLANEOUS) ×2 IMPLANT
COVER DOME SNAP 22 D (MISCELLANEOUS) ×2 IMPLANT
COVER TABLE BACK 60X90 (DRAPES) ×2 IMPLANT
FORCEPS BIOP RJ4 1.8 (CUTTING FORCEPS) IMPLANT
GAUZE SPONGE 4X4 12PLY STRL (GAUZE/BANDAGES/DRESSINGS) ×2 IMPLANT
GLOVE BIO SURGEON STRL SZ 6.5 (GLOVE) ×2 IMPLANT
KIT CLEAN ENDO COMPLIANCE (KITS) ×6 IMPLANT
KIT ROOM TURNOVER OR (KITS) ×2 IMPLANT
MARKER SKIN DUAL TIP RULER LAB (MISCELLANEOUS) ×2 IMPLANT
NDL BIOPSY TRANSBRONCH 21G (NEEDLE) IMPLANT
NDL BLUNT 18X1 FOR OR ONLY (NEEDLE) IMPLANT
NDL EBUS SONO TIP PENTAX (NEEDLE) ×1 IMPLANT
NEEDLE BIOPSY TRANSBRONCH 21G (NEEDLE) IMPLANT
NEEDLE BLUNT 18X1 FOR OR ONLY (NEEDLE) IMPLANT
NEEDLE EBUS SONO TIP PENTAX (NEEDLE) ×2 IMPLANT
NS IRRIG 1000ML POUR BTL (IV SOLUTION) ×2 IMPLANT
OIL SILICONE PENTAX (PARTS (SERVICE/REPAIRS)) ×2 IMPLANT
PAD ARMBOARD 7.5X6 YLW CONV (MISCELLANEOUS) ×4 IMPLANT
SYR 20CC LL (SYRINGE) ×2 IMPLANT
SYR 20ML ECCENTRIC (SYRINGE) ×2 IMPLANT
TOWEL OR 17X24 6PK STRL BLUE (TOWEL DISPOSABLE) ×2 IMPLANT
TRAP SPECIMEN MUCOUS 40CC (MISCELLANEOUS) ×2 IMPLANT
TUBE CONNECTING 20X1/4 (TUBING) ×2 IMPLANT
WATER STERILE IRR 1000ML POUR (IV SOLUTION) ×2 IMPLANT

## 2015-12-31 NOTE — Anesthesia Procedure Notes (Signed)
Procedure Name: Intubation Date/Time: 12/31/2015 9:41 AM Performed by: Lance Coon Pre-anesthesia Checklist: Patient identified, Emergency Drugs available, Suction available, Patient being monitored and Timeout performed Patient Re-evaluated:Patient Re-evaluated prior to inductionOxygen Delivery Method: Circle system utilized Preoxygenation: Pre-oxygenation with 100% oxygen Intubation Type: IV induction Ventilation: Mask ventilation without difficulty and Oral airway inserted - appropriate to patient size Laryngoscope Size: Miller and 3 Grade View: Grade I Tube type: Oral Tube size: 8.5 mm Number of attempts: 1 Airway Equipment and Method: Stylet Placement Confirmation: ETT inserted through vocal cords under direct vision,  positive ETCO2 and breath sounds checked- equal and bilateral Secured at: 21 cm Tube secured with: Tape Dental Injury: Teeth and Oropharynx as per pre-operative assessment

## 2015-12-31 NOTE — H&P (Signed)
[address]       [place] [address]             [phone]                    Shariff D Heist [facility] Medical Record #[id number] Date of Birth: July 23, 1940  Referring: Dr Edrick Oh Primary Care: Sherrie Mustache, MD  Chief Complaint:    Lung mass   History of Present Illness:    Mark Howell 75 y.o. male is seen in the office  today for abnormal ct of chest and PET scan. Patient long term smoker more then 50 years , he qui tone month ago. He has noted weight  loss and diarrhea . A chest xray was done , which was abnormal.       Current Activity/ Functional Status:  Patient is independent with mobility/ambulation, transfers, ADL's, IADL's.   Zubrod Score: At the time of surgery this patient's most appropriate activity status/level should be described as: '[x]'$     0    Normal activity, no symptoms '[]'$     1    Restricted in physical strenuous activity but ambulatory, able to do out light work '[]'$     2    Ambulatory and capable of self care, unable to do work activities, up and about               >50 % of waking hours                              '[]'$     3    Only limited self care, in bed greater than 50% of waking hours '[]'$     4    Completely disabled, no self care, confined to bed or chair '[]'$     5    Moribund   Past Medical History:  Diagnosis Date  . Arthritis   . Complication of anesthesia    problems voiding post op  . Full dentures   . GERD (gastroesophageal reflux disease)   . HOH (hard of hearing)   . HTN (hypertension)   . Hyperlipidemia   . Lung mass 12/30/2015  . Psoriatic arthritis ([place])   . Snores   . Wears glasses     Past Surgical History:  Procedure Laterality Date  . COLONOSCOPY    . HEMORRHOID SURGERY N/A 10/01/2013   Procedure: EXAM UNDER ANESTHESIA  AND EXCISIONAL HEMORRHOIDECTOMY WITH HEMORRHOID BANDING X 2;  Surgeon: Gayland Curry, MD;  Location: [facility];  Service: General;  Laterality: N/A;  .  INGUINAL HERNIA REPAIR  01/04/2012   Procedure: LAPAROSCOPIC INGUINAL HERNIA;  Surgeon: Gayland Curry, MD,FACS;  Location: [facility];  Service: General;  Laterality: Right;    Family History  Problem Relation Age of Onset  . Stroke Father   . Diabetes Father   . Heart disease Father   . Cancer Brother     pancreatic    Social History   Social History  . Marital status: Married    Spouse name: N/A  . Number of children: N/A  . Years of education: N/A   Occupational History  . Not on file.   Social History Main Topics  . Smoking status: Current Every Day Smoker    Packs/day: 0.50    Years: 50.00    Types: Cigarettes  . Smokeless tobacco: Never Used  . Alcohol use  Yes     Comment: occassionally  . Drug use: No  . Sexual activity: Not on file      History  Smoking Status  . Current Every Day Smoker  . Packs/day: 0.50  . Years: 50.00  . Types: Cigarettes  Smokeless Tobacco  . Never Used    History  Alcohol Use  . Yes    Comment: occassionally     Allergies  Allergen Reactions  . Hydrocodone Rash  . Oxycodone Rash    Current Facility-Administered Medications  Medication Dose Route Frequency Provider Last Rate Last Dose  . lactated ringers infusion   Intravenous Continuous Montez Hageman, MD 50 mL/hr at 12/31/15 3664        Review of Systems:     Cardiac Review of Systems: Y or N  Chest Pain [n    ]  Resting SOB [ n  ] Exertional SOB  [ n ]  Orthopnea [  ]   Pedal Edema [   ]    Palpitations [  ] Syncope  [  ]   Presyncope [n   ]  General Review of Systems: [Y] = yes [  ]=no Constitional: recent weight change [ y ];  Wt loss over the last 3 months [   ] anorexia [  ]; fatigue [  ]; nausea [  ]; night sweats [  ]; fever [  ]; or chills [  ];          Dental: poor dentition[  ]; Last Dentist visit:   Eye : blurred vision [  ]; diplopia [   ]; vision changes [  ];  Amaurosis fugax[  ]; Resp: cough [  ];  wheezing[  ];  hemoptysis[  ]; shortness of breath[   ]; paroxysmal nocturnal dyspnea[  ]; dyspnea on exertion[  ]; or orthopnea[  ];  GI:  gallstones[  ], vomiting[  ];  dysphagia[  ]; melena[  ];  hematochezia [  ]; heartburn[  ];   Hx of  Colonoscopy[  ]; GU: kidney stones [  ]; hematuria[  ];   dysuria [  ];  nocturia[  ];  history of     obstruction [  ]; urinary frequency [  ]             Skin: rash, swelling[  ];, hair loss[  ];  peripheral edema[  ];  or itching[  ]; Musculosketetal: myalgias[  ];  joint swelling[  ];  joint erythema[  ];  joint pain[  ];  back pain[  ];  Heme/Lymph: bruising[  ];  bleeding[  ];  anemia[  ];  Neuro: TIA[n  ];  headaches[  ];  stroke[  ];  vertigo[  ];  seizures[  ];   paresthesias[  ];  difficulty walking[  ];  Psych:depression[  ]; anxiety[  ];  Endocrine: diabetes[  ];  thyroid dysfunction[  ];  Immunizations: Flu up to date [  ]; Pneumococcal up to date [  ];  Other:  Physical Exam: BP (!) 182/76   Pulse 62   Temp 98.3 F (36.8 C) (Oral)   Resp 18   Ht '6\' 1"'$  (1.854 m)   Wt 170 lb (77.1 kg)   SpO2 100%   BMI 22.43 kg/m   PHYSICAL EXAMINATION: General appearance: alert, cooperative and appears stated age Head: Normocephalic, without obvious abnormality, atraumatic Neck: no adenopathy, no carotid bruit, no JVD, supple, symmetrical, trachea midline and thyroid not  enlarged, symmetric, no tenderness/mass/nodules Lymph nodes: Cervical, supraclavicular, and axillary nodes normal. Resp: clear to auscultation bilaterally Back: symmetric, no curvature. ROM normal. No CVA tenderness. Cardio: regular rate and rhythm, S1, S2 normal, no murmur, click, rub or gallop GI: soft, non-tender; bowel sounds normal; no masses,  no organomegaly Extremities: extremities normal, atraumatic, no cyanosis or edema Neurologic: Grossly normal  Diagnostic Studies & Laboratory data:     Recent Radiology Findings:   Dg Chest 2 View  Result Date: 12/03/2015 CLINICAL DATA:  Long-term smoker, discontinued 1 week ago,  currently asymptomatic. The patient reports frequent diarrhea and a 12 pound weight loss over the past 6 months EXAM: CHEST  2 VIEW COMPARISON:  None in PACs FINDINGS: The right lung is well-expanded and clear. On the left there is patchy interstitial density in the lower lobe and peripherally in the inferior aspect of the upper lobe. The heart is normal in size. The pulmonary vascularity is not engorged. There is calcification in the wall of the aortic arch. There is mild multilevel degenerative disc disease of the thoracic spine. IMPRESSION: Interstitial changes in the left lung which are nonspecific and could be acute or chronic. Underlying COPD. Given the patient's clinical history, CT scanning of the chest would be useful to exclude occult malignancy. Electronically Signed   By: David  Martinique M.D.   On: 12/03/2015 08:36   Ct Chest W Contrast  Result Date: 12/17/2015 CLINICAL DATA:  Abnormal chest x-ray, history of tobacco use EXAM: CT CHEST WITH CONTRAST TECHNIQUE: Multidetector CT imaging of the chest was performed during intravenous contrast administration. CONTRAST:  37m ISOVUE-300 IOPAMIDOL (ISOVUE-300) INJECTION 61% COMPARISON:  12/03/2015 FINDINGS: Cardiovascular: The thoracic aorta shows calcific changes without aneurysmal dilatation or dissection. The pulmonary artery as visualized shows no evidence of pulmonary emboli. Coronary calcifications are noted. The cardiac structures are otherwise within normal limits. Mediastinum/Nodes: The thoracic inlet is within normal limits. No significant mediastinal adenopathy is seen. Right hilar adenopathy is identified measuring 15 by 21 mm in greatest dimension. Lungs/Pleura: The lungs are well aerated bilaterally and demonstrate some mild emphysematous changes. In the right lung there are scattered nodules identified the largest of which lies within the right middle lobe measuring 6 mm. A few smaller nodules are seen. The left lung also shows nodular areas.  There are 2 dominant nodules which appears somewhat linked by a intervening soft tissue. The more inferior of these is noted on image number 106 of series 5 and measures approximately 11 by 14 mm in greatest dimension. Superior to this and interconnected by intervening soft tissue is an area of nodular density measuring at least 14 by 16 mm. Upper Abdomen: No acute abnormality noted. Musculoskeletal: Degenerative changes of the thoracic spine IMPRESSION: Bilateral pulmonary nodules with evidence of right hilar adenopathy. Neoplasm is felt to be highly likely. PET-CT may be helpful for further evaluation. These results will be called to the ordering clinician or representative by the Radiologist Assistant, and communication documented in the PACS or zVision Dashboard. Electronically Signed   By: MInez CatalinaM.D.   On: 12/17/2015 12:02   Ct Abdomen Pelvis W Contrast  Result Date: 12/03/2015 CLINICAL DATA:  Diarrhea and weight loss. EXAM: CT ABDOMEN AND PELVIS WITH CONTRAST TECHNIQUE: Multidetector CT imaging of the abdomen and pelvis was performed using the standard protocol following bolus administration of intravenous contrast. CONTRAST:  1040mISOVUE-300 IOPAMIDOL (ISOVUE-300) INJECTION 61% COMPARISON:  None. FINDINGS: Lower chest: No acute pulmonary findings. Bibasilar scarring changes. No infiltrates  or effusions. No worrisome pulmonary lesions. The heart is normal in size. No pericardial effusion. No coronary artery calcifications. The distal esophagus is grossly normal. Hepatobiliary: No focal hepatic lesions or intrahepatic biliary dilatation. The gallbladder is normal. No common bile duct dilatation. Pancreas: No mass, inflammation or ductal dilatation. Spleen: Normal size.  No focal lesions. Adrenals/Urinary Tract: The adrenal glands are normal. Bilateral renal calculi and renal scarring changes. No hydronephrosis or obstructing ureteral calculi. Small renal cysts are noted. No worrisome renal lesions.  No significant collecting system abnormalities on the delayed images. 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 Stomach/Bowel: The stomach, duodenum, small-bowel and colon or unremarkable. No inflammatory changes, mass lesions or obstructive findings. The terminal ileum is normal. The appendix is normal. There is diffuse distal descending and sigmoid diverticulosis and muscular wall thickening but no definite findings for acute diverticulitis. No mass or obstruction. Vascular/Lymphatic: Moderate atherosclerotic calcifications involving the aorta and branch vessel ostia. No aneurysm or dissection. Other: Enlarged prostate gland with median lobe hypertrophy impressing on the base the bladder. The bladder wall is mildly thickened and this is likely due to partial bladder outlet obstruction. No bladder mass. The seminal vesicles are unremarkable. No pelvic mass or adenopathy. No free pelvic fluid collections. No inguinal mass or adenopathy. Slightly dilated vessels in the left spermatic cord could suggest varicocele. Musculoskeletal: No significant bony findings. Degenerative changes noted in the spine head hips. IMPRESSION: 1. ureteral calculi or bladder calculi. 2. Renal scarring changes and small cysts. 3. Descending and sigmoid colon diverticulosis without definite findings for acute diverticulitis. 4. No abdominal/pelvic mass lesions or lymphadenopathy. 5. Moderate atherosclerotic changes involving the aorta and branch vessels. 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 Electronically Signed   By: Marijo Sanes M.D.   On: 12/03/2015 10:01   Nm Pet Image Initial (pi) Skull Base To Thigh  Result Date: 12/27/2015 CLINICAL DATA:  Initial treatment strategy for bilateral pulmonary nodules on CT. EXAM: NUCLEAR MEDICINE PET SKULL BASE TO THIGH TECHNIQUE: 8.46 mCi F-18 FDG was injected intravenously. Full-ring PET imaging was performed from the skull base to thigh after the radiotracer. CT  data was obtained and used for attenuation correction and anatomic localization. FASTING BLOOD GLUCOSE:  Value: 114 Mg/dl COMPARISON:  Abdominal pelvic CT 12/03/2015.  Chest CT 12/17/2015. FINDINGS: NECK No hypermetabolic cervical lymph nodes are identified.There are no lesions of the pharyngeal mucosal space. Chronic atherosclerosis noted. CHEST Approximately 2.8 cm right hilar mass is hypermetabolic with an SUV max of 13.7. No other hypermetabolic mediastinal, hilar or axillary lymph nodes are seen. There are no hypermetabolic pulmonary nodules. Specifically, no abnormal activity is seen associated with the 6 mm right middle lobe nodule on image 44 or the 9 x 5 mm right middle lobe nodule on image 46. These nodules are suboptimally evaluated based on size. The irregular left lung densities demonstrate no abnormal metabolic activity, favored to reflect postinflammatory scarring based on morphology. Moderate emphysematous changes are present. ABDOMEN/PELVIS There is no hypermetabolic activity within the liver, adrenal glands, spleen or pancreas. There is no hypermetabolic nodal activity. The liver demonstrates mildly decreased density suspicious for steatosis. There are bilateral nonobstructing renal calculi and cortical scarring in both kidneys. There are diverticular changes within the sigmoid colon, enlargement of the prostate gland and diffuse bladder wall thickening. There is aortic and branch vessel atherosclerosis. SKELETON There is no hypermetabolic activity to suggest osseous metastatic disease. IMPRESSION: 1. Hypermetabolic right hilar mass worrisome for  central bronchogenic carcinoma or metastatic disease to right hilar lymph nodes. Bronchoscopy suggested for tissue sampling. 2. No other suspicious metabolic activity. The small nodules in the right middle lobe demonstrate no abnormal metabolic activity, although are not optimally evaluated based on size. The left lung findings are favored to be  postinflammatory. 3. No evidence of metastatic disease within the abdomen or pelvis. 4. Bilateral nephrolithiasis and diverticulosis of the distal colon. Electronically Signed   By: Richardean Sale M.D.   On: 12/27/2015 14:31     I have independently reviewed the above radiologic studies.  Recent Lab Findings: Lab Results  Component Value Date   WBC 8.8 12/31/2015   HGB 15.2 12/31/2015   HCT 45.0 12/31/2015   PLT 134 (L) 12/31/2015   GLUCOSE 116 (H) 12/31/2015   ALT 14 (L) 12/31/2015   AST 23 12/31/2015   NA 137 12/31/2015   K 3.8 12/31/2015   CL 103 12/31/2015   CREATININE 1.09 12/31/2015   BUN 12 12/31/2015   CO2 25 12/31/2015   INR 1.06 12/31/2015     Assessment / Plan:   1/ right hilar mass suggestive  for central bronchogenic carcinoma or metastatic disease to right hilar lymph nodes. 2/Bilateral nephrolithiasis  3/ diverticulosis of the distal colon.    I have discussed with patient his wife and daughter findings and recommended proceeding with bronchoscopy and EBUS with biopsy. Patient is agreeable.  The goals risks and alternatives of the planned surgical procedure Bronchoscopy and EBUS with biopsy  have been discussed with the patient in detail. The risks of the procedure including death, infection, stroke, myocardial infarction, bleeding, blood transfusion have all been discussed specifically.  I have quoted Mark Howell a 1 % of perioperative mortality and a complication rate as high as 10 %. The patient's questions have been answered.Mark Howell is willing  to proceed with the planned procedure.   Grace Isaac MD      [address] [place] [address] Office [phone]   Beeper [phone]  12/31/2015 9:09 AM

## 2015-12-31 NOTE — Anesthesia Preprocedure Evaluation (Signed)
Anesthesia Evaluation  Patient identified by MRN, date of birth, ID band Patient awake    Reviewed: Allergy & Precautions, H&P , NPO status , Patient's Chart, lab work & pertinent test results  Airway Mallampati: I  TM Distance: >3 FB Neck ROM: Full    Dental  (+) Upper Dentures, Lower Dentures, Dental Advisory Given   Pulmonary Current Smoker,    breath sounds clear to auscultation       Cardiovascular hypertension, Pt. on medications  Rhythm:Regular Rate:Normal     Neuro/Psych negative neurological ROS  negative psych ROS   GI/Hepatic Neg liver ROS, GERD  Medicated and Controlled,  Endo/Other  negative endocrine ROS  Renal/GU negative Renal ROS  negative genitourinary   Musculoskeletal negative musculoskeletal ROS (+)   Abdominal   Peds negative pediatric ROS (+)  Hematology negative hematology ROS (+)   Anesthesia Other Findings   Reproductive/Obstetrics negative OB ROS                             Anesthesia Physical  Anesthesia Plan  ASA: II  Anesthesia Plan: General   Post-op Pain Management:    Induction: Intravenous  Airway Management Planned: Oral ETT  Additional Equipment:   Intra-op Plan:   Post-operative Plan: Extubation in OR  Informed Consent: I have reviewed the patients History and Physical, chart, labs and discussed the procedure including the risks, benefits and alternatives for the proposed anesthesia with the patient or authorized representative who has indicated his/her understanding and acceptance.   Dental advisory given  Plan Discussed with: CRNA, Anesthesiologist and Surgeon  Anesthesia Plan Comments:         Anesthesia Quick Evaluation

## 2015-12-31 NOTE — Transfer of Care (Signed)
Immediate Anesthesia Transfer of Care Note  Patient: Mark Howell  Procedure(s) Performed: Procedure(s): VIDEO BRONCHOSCOPY WITH ENDOBRONCHIAL ULTRASOUND transbronchial biopsy of node 10 R lymph node (N/A)  Patient Location: PACU  Anesthesia Type:General  Level of Consciousness: awake, alert , oriented and patient cooperative  Airway & Oxygen Therapy: Patient Spontanous Breathing and Patient connected to face mask oxygen  Post-op Assessment: Report given to RN and Post -op Vital signs reviewed and stable  Post vital signs: Reviewed and stable  Last Vitals:  Vitals:   12/31/15 0728  BP: (!) 182/76  Pulse: 62  Resp: 18  Temp: 36.8 C    Last Pain:  Vitals:   12/31/15 0728  TempSrc: Oral      Patients Stated Pain Goal: 7 (91/69/45 0388)  Complications: No apparent anesthesia complications

## 2015-12-31 NOTE — Discharge Instructions (Signed)
Flexible Bronchoscopy, Care After Refer to this sheet in the next few weeks. These instructions provide you with information on caring for yourself after your procedure. Your health care provider may also give you more specific instructions. Your treatment has been planned according to current medical practices, but problems sometimes occur. Call your health care provider if you have any problems or questions after your procedure.  WHAT TO EXPECT AFTER THE PROCEDURE It is normal to have the following symptoms for 24-48 hours after the procedure:   Increased cough.  Low-grade fever.  Sore throat or hoarse voice.  Small streaks of blood in your thick spit (sputum) if tissue samples were taken (biopsy). HOME CARE INSTRUCTIONS   Do not eat or drink anything for 2 hours after your procedure. Your nose and throat were numbed by medicine. If you try to eat or drink before the medicine wears off, food or drink could go into your lungs or you could burn yourself. After the numbness is gone and your cough and gag reflexes have returned, you may eat soft food and drink liquids slowly.   The day after the procedure, you can go back to your normal diet.   You may resume normal activities.   Keep all follow-up visits as directed by your health care provider. It is important to keep all your appointments, especially if tissue samples were taken for testing (biopsy). SEEK IMMEDIATE MEDICAL CARE IF:   You have increasing shortness of breath.   You become light-headed or faint.   You have chest pain.   You have any new concerning symptoms.  You cough up more than a small amount of blood.  The amount of blood you cough up increases. MAKE SURE YOU:  Understand these instructions.  Will watch your condition.  Will get help right away if you are not doing well or get worse.   This information is not intended to replace advice given to you by your health care provider. Make sure you discuss  any questions you have with your health care provider.   Document Released: 11/04/2004 Document Revised: 05/08/2014 Document Reviewed: 12/20/2012 Elsevier Interactive Patient Education [employer].

## 2015-12-31 NOTE — Brief Op Note (Signed)
      [address]       [place], [address]             [phone]     12/31/2015  11:03 AM  PATIENT:  Mark Howell  75 y.o. 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   Delay start of Pharmacological VTE agent (>24hrs) due to surgical blood loss or risk of bleeding: yes

## 2015-12-31 NOTE — Telephone Encounter (Signed)
Oncology Nurse Navigator Documentation  Oncology Nurse Navigator Flowsheets 12/31/2015  Navigator Encounter Type Telephone/I followed up with Darlena regarding per cert for patient.  Mr. Terrien MRI Brain has been authorized.  I called central scheduling and was given an appt for 01/01/16.  I called Mr. Maclellan phone and left vm message to call me.  I also called cell but was unable to reach.  I also called PACU to see if patient was still at [facility] but he left after 12:00.  I will call him later today.   Telephone Outgoing Call  Treatment Phase Pre-Tx/Tx Discussion  Barriers/Navigation Needs Coordination of Care  Interventions Coordination of Care  Coordination of Care Appts  Acuity Level 2  Acuity Level 2 Assistance expediting appointments  Time Spent with Patient 30

## 2015-12-31 NOTE — Telephone Encounter (Signed)
Oncology Nurse Navigator Documentation  Oncology Nurse Navigator Flowsheets 12/31/2015  Navigator Encounter Type Telephone/I called to follow up with Mark Howell.  I updated him and his wife on his appt for MRI Brain tomorrow.  I gave them time and place of appt  Telephone Outgoing Call  Treatment Phase Pre-Tx/Tx Discussion  Barriers/Navigation Needs Coordination of Care  Interventions Coordination of Care  Coordination of Care Radiology;Appts  Acuity Level 1  Acuity Level 1 Minimal follow up required  Time Spent with Patient 15

## 2016-01-01 ENCOUNTER — Ambulatory Visit (HOSPITAL_COMMUNITY)
Admission: RE | Admit: 2016-01-01 | Discharge: 2016-01-01 | Disposition: A | Discharge status: Home / Self Care | Payer: Medicare Other | Source: Ambulatory Visit | Attending: Internal Medicine | Admitting: Internal Medicine

## 2016-01-01 DIAGNOSIS — R918 Other nonspecific abnormal finding of lung field: Secondary | ICD-10-CM | POA: Insufficient documentation

## 2016-01-01 DIAGNOSIS — R930 Abnormal findings on diagnostic imaging of skull and head, not elsewhere classified: Secondary | ICD-10-CM | POA: Diagnosis not present

## 2016-01-01 DIAGNOSIS — J322 Chronic ethmoidal sinusitis: Secondary | ICD-10-CM | POA: Diagnosis not present

## 2016-01-01 MED ORDER — GADOBENATE DIMEGLUMINE 529 MG/ML IV SOLN
16.0000 mL | Freq: Once | INTRAVENOUS | Status: AC | PRN
  Administered 2016-01-01: 16 mL via INTRAVENOUS

## 2016-01-01 NOTE — Op Note (Signed)
NAME:  KJELL, BRANNEN NO.:  [id number]  MEDICAL RECORD NO.:  [id number]  LOCATION:  [facility]                         FACILITY:  [facility]  PHYSICIAN:  Lanelle Bal, MD    DATE OF BIRTH:  20-Jan-1941  DATE OF PROCEDURE:  12/31/2015 DATE OF DISCHARGE:  12/31/2015                              OPERATIVE REPORT   PREOPERATIVE DIAGNOSIS:  Right hilar mass, positive on PET scan.  POSTOPERATIVE DIAGNOSIS:  Right hilar mass, positive on PET scan.  Malignant cells noted on quick stain.  PROCEDURE PERFORMED:  Fiberoptic bronchoscopy under general anesthesia. Endoscopic bronchial ultrasound with transbronchial biopsies of 10R lymph nodes.  SURGEON:  Lanelle Bal, MD.  BRIEF HISTORY:  The patient is a 75 year old male who had presented with history of weight loss and episodic loose stools.  In this evaluation, a chest x-ray was obtained which was abnormal leading to a CT scan of the chest and PET scan.  The patient was seen yesterday in the Multidisciplinary Thoracic Oncology Clinic.  The radiographic findings were highly suspicious for lung malignancy involving the right hilum. There were some small non-PET avid nodules in the right and left lung. Recommendation, the patient was to proceed with bronchoscopy and EBUS and transbronchial biopsy to obtain a tissue diagnosis.  Risks and options were discussed with the patient in detail and he was agreeable with proceeding.  DESCRIPTION OF PROCEDURE:  The patient underwent general endotracheal anesthesia without incident.  With a 8.5-French endotracheal tube. Appropriate time-out was performed.  We then proceeded with a video bronchoscopy of the right and left tracheobronchial tree without evidence of endobronchial lesion.  Photographs are under the media tab in Epic.  We then removed the visual scope and placed the EBUS scope through the endotracheal tube.  After carina, there were no enlarged lymph nodes appreciated.  We  positioned the EBUS scope along the right tracheobronchial tree and located 10R lymph nodes #1 and #2.  Multiple passes were made along the 10R lymph nodes.  The initials cytologic smear showed lymphoid tissue and malignant cells.  Additional passes in the same area were performed maximizing the sample in the satellite to create a cell block.  Blood loss was minimal.  The patient tolerated the procedure without complication.  The EBUS scope was removed.  A standard visual scope was replaced and tracheobronchial trees cleared of any secretions.  It was noted there was minimal bleeding.  The scopes were removed.  The patient was extubated in the operating room and transferred to the recovery room in stable condition.     Lanelle Bal, MD     EG/MEDQ  D:  12/31/2015  T:  01/01/2016  Job:  [id number]  cc:   Dr. Julien Nordmann

## 2016-01-02 ENCOUNTER — Encounter (HOSPITAL_COMMUNITY): Payer: Self-pay | Admitting: Cardiothoracic Surgery

## 2016-01-04 ENCOUNTER — Encounter: Payer: Self-pay | Admitting: *Deleted

## 2016-01-05 ENCOUNTER — Encounter: Payer: Self-pay | Admitting: *Deleted

## 2016-01-05 ENCOUNTER — Telehealth: Payer: Self-pay | Admitting: *Deleted

## 2016-01-05 DIAGNOSIS — R918 Other nonspecific abnormal finding of lung field: Secondary | ICD-10-CM

## 2016-01-05 NOTE — Telephone Encounter (Signed)
Oncology Nurse Navigator Documentation  Oncology Nurse Navigator Flowsheets 01/05/2016  Navigator Encounter Type Telephone/per Dr. Servando Snare I called patient to set up for [facility] on 01/13/16.  Patient verbalized understanding of appt time and place.   Telephone Outgoing Call  Treatment Phase Pre-Tx/Tx Discussion  Barriers/Navigation Needs Coordination of Care  Interventions Coordination of Care  Coordination of Care Appts  Acuity Level 2  Acuity Level 2 Assistance expediting appointments  Time Spent with Patient 30

## 2016-01-06 ENCOUNTER — Telehealth: Payer: Self-pay | Admitting: Medical Oncology

## 2016-01-06 NOTE — Telephone Encounter (Signed)
Wife asking for MRI results -I told her " 1. No metastatic disease or acute intracranial abnormality."She was relieved.

## 2016-01-09 NOTE — Anesthesia Postprocedure Evaluation (Signed)
Anesthesia Post Note  Patient: ERCEL PEPITONE  Procedure(s) Performed: Procedure(s) (LRB): VIDEO BRONCHOSCOPY WITH ENDOBRONCHIAL ULTRASOUND transbronchial biopsy of node 10 R lymph node (N/A)  Patient location during evaluation: PACU Anesthesia Type: General Level of consciousness: awake and alert Pain management: pain level controlled Vital Signs Assessment: post-procedure vital signs reviewed and stable Respiratory status: spontaneous breathing, nonlabored ventilation, respiratory function stable and patient connected to nasal cannula oxygen Cardiovascular status: blood pressure returned to baseline and stable Postop Assessment: no signs of nausea or vomiting Anesthetic complications: no     Last Vitals:  Vitals:   12/31/15 1157 12/31/15 1219  BP: (!) 147/80 137/83  Pulse: 63 62  Resp: 18   Temp:      Last Pain:  Vitals:   12/31/15 1150  TempSrc:   PainSc: 0-No pain   Pain Goal: Patients Stated Pain Goal: 7 (12/31/15 0753)               Montez Hageman

## 2016-01-10 ENCOUNTER — Telehealth: Payer: Self-pay | Admitting: *Deleted

## 2016-01-10 NOTE — Telephone Encounter (Signed)
Oncology Nurse Navigator Documentation  Oncology Nurse Navigator Flowsheets 01/10/2016  Navigator Encounter Type Telephone/I called but was unable to reach.   Telephone Outgoing Call  Patient Visit Type -  Treatment Phase Pre-Tx/Tx Discussion  Barriers/Navigation Needs Coordination of Care  Interventions Coordination of Care  Coordination of Care Appts  Acuity Level 1  Acuity Level 1 Minimal follow up required  Acuity Level 2 -  Time Spent with Patient 15

## 2016-01-11 ENCOUNTER — Encounter: Payer: Self-pay | Admitting: *Deleted

## 2016-01-11 NOTE — Progress Notes (Signed)
Oncology Nurse Navigator Documentation  Oncology Nurse Navigator Flowsheets 01/11/2016  Navigator Encounter Type Other/updated oncology hx in EPIC  Treatment Phase Pre-Tx/Tx Discussion  Barriers/Navigation Needs Coordination of Care  Interventions Coordination of Care  Coordination of Care Other  Acuity Level 2  Acuity Level 2 Other  Time Spent with Patient 30

## 2016-01-13 ENCOUNTER — Ambulatory Visit (HOSPITAL_BASED_OUTPATIENT_CLINIC_OR_DEPARTMENT_OTHER): Payer: Medicare Other | Admitting: Internal Medicine

## 2016-01-13 ENCOUNTER — Ambulatory Visit
Admission: RE | Admit: 2016-01-13 | Discharge: 2016-01-13 | Disposition: A | Discharge status: Home / Self Care | Payer: Medicare Other | Source: Ambulatory Visit | Attending: Radiation Oncology | Admitting: Radiation Oncology

## 2016-01-13 ENCOUNTER — Other Ambulatory Visit (HOSPITAL_BASED_OUTPATIENT_CLINIC_OR_DEPARTMENT_OTHER): Payer: Medicare Other

## 2016-01-13 ENCOUNTER — Other Ambulatory Visit: Payer: Medicare Other

## 2016-01-13 DIAGNOSIS — Z5111 Encounter for antineoplastic chemotherapy: Secondary | ICD-10-CM | POA: Insufficient documentation

## 2016-01-13 DIAGNOSIS — C3401 Malignant neoplasm of right main bronchus: Secondary | ICD-10-CM

## 2016-01-13 DIAGNOSIS — C3491 Malignant neoplasm of unspecified part of right bronchus or lung: Secondary | ICD-10-CM

## 2016-01-13 DIAGNOSIS — R918 Other nonspecific abnormal finding of lung field: Secondary | ICD-10-CM

## 2016-01-13 LAB — CBC WITH DIFFERENTIAL/PLATELET
BASO%: 0.1 % (ref 0.0–2.0)
BASOS ABS: 0 10*3/uL (ref 0.0–0.1)
EOS ABS: 0.2 10*3/uL (ref 0.0–0.5)
EOS%: 2.9 % (ref 0.0–7.0)
HEMATOCRIT: 41.1 % (ref 38.4–49.9)
HGB: 14.6 g/dL (ref 13.0–17.1)
LYMPH%: 20.5 % (ref 14.0–49.0)
MCH: 31.5 pg (ref 27.2–33.4)
MCHC: 35.5 g/dL (ref 32.0–36.0)
MCV: 88.6 fL (ref 79.3–98.0)
MONO#: 0.8 10*3/uL (ref 0.1–0.9)
MONO%: 10.1 % (ref 0.0–14.0)
NEUT%: 66.4 % (ref 39.0–75.0)
NEUTROS ABS: 5.4 10*3/uL (ref 1.5–6.5)
PLATELETS: 137 10*3/uL — AB (ref 140–400)
RBC: 4.64 10*6/uL (ref 4.20–5.82)
RDW: 13 % (ref 11.0–14.6)
WBC: 8.2 10*3/uL (ref 4.0–10.3)
lymph#: 1.7 10*3/uL (ref 0.9–3.3)
nRBC: 0 % (ref 0–0)

## 2016-01-13 LAB — COMPREHENSIVE METABOLIC PANEL
ALT: 13 U/L (ref 0–55)
ANION GAP: 10 meq/L (ref 3–11)
AST: 18 U/L (ref 5–34)
Albumin: 3.7 g/dL (ref 3.5–5.0)
Alkaline Phosphatase: 111 U/L (ref 40–150)
BUN: 14.3 mg/dL (ref 7.0–26.0)
CALCIUM: 9.5 mg/dL (ref 8.4–10.4)
CHLORIDE: 104 meq/L (ref 98–109)
CO2: 25 meq/L (ref 22–29)
Creatinine: 1.1 mg/dL (ref 0.7–1.3)
EGFR: 63 mL/min/{1.73_m2} — AB (ref >90)
Glucose: 119 mg/dl (ref 70–140)
POTASSIUM: 4.1 meq/L (ref 3.5–5.1)
Sodium: 139 mEq/L (ref 136–145)
Total Bilirubin: 0.38 mg/dL (ref 0.20–1.20)
Total Protein: 7.8 g/dL (ref 6.4–8.3)

## 2016-01-13 MED ORDER — PROCHLORPERAZINE MALEATE 10 MG PO TABS: 10.0000 mg | ORAL_TABLET | Freq: Four times a day (QID) | ORAL | 0 refills | Status: DC | PRN

## 2016-01-13 NOTE — Progress Notes (Signed)
START ON PATHWAY REGIMEN - Non-Small Cell Lung  TYO060: Carboplatin AUC=2 + Paclitaxel 45 mg/m2 Weekly During Radiation   Administer weekly:     Paclitaxel (Taxol(R)) 45 mg/m2 in 250 mL NS IV over 1 hour followed by Dose Mod: None     Carboplatin (Paraplatin(R)) AUC=2 in 100 mL NS IV over 30 minutes Dose Mod: None Additional Orders: * All AUC calculations intended to be used in [employer] formula  **Always confirm dose/schedule in your pharmacy ordering system**    Patient Characteristics: Stage II - Unresectable AJCC M Stage: 0 AJCC N Stage: 1 AJCC T Stage: 1a Current Disease Status: No Distant Metastases or Local Recurrence AJCC Stage Grouping: IIA  Intent of Therapy: Curative Intent, Discussed with Patient

## 2016-01-13 NOTE — Progress Notes (Signed)
Radiation Oncology         [phone] ________________________________  Initial Outpatient Consultation  Name: Mark Howell MRN: [id number]  Date: 01/13/2016  DOB: 09/01/1940  XT:GGYIRS,WNIOEVO ROBERT, MD  [name], MD   REFERRING PHYSICIAN: [name], MD  DIAGNOSIS: The encounter diagnosis was Adenocarcinoma of right lung, stage 2 ([place]).   Clinical stage IIA (T1a, N1, M0) adenocarcinoma of the right lung  HISTORY OF PRESENT ILLNESS::Mark Howell is a 75 y.o. male who reported frequent diarrhea and a 12 lb weight loss over 6 months. He was seen by his gastroenterologist, Dr. Watt Climes, with no improvement in his diarrhea. Because of his weight loss and history of smoking, the patient had CT scan of the abdomen and pelvis as well as a chest x-ray performed on 12/03/2015. The chest x-ray showed interstitial changes in the left lung which are nonspecific. CT scan of the abdomen and pelvis showed descending and sigmoid colon diverticulosis without definite findings of acute diverticulitis, but there were no abdominal/pelvic masses, lesions, or adenopathy. There were also bilateral renal calculi but no obstructing ureteral calculi or bladder calculi.  CT scan of the chest with contrast was performed on 12/17/2015 and it showed bilateral pulmonary nodules with evidence of right hilar adenopathy. Malignancy was felt to be likely. The right hilar adenopathy measured 1.5 x 2.1 cm. In the right lung there were scattered nodules identified with the largest of which within the right middle lobe and measured 0.6 cm. There was also a dominant nodule in the left lung measuring 1.1 x 1.4 cm in greatest dimension and superior to it there was another nodular density measuring 1.4 x 1.6 cm.  The patient had a PET scan on 12/27/2015 and it showed a 2.8 cm hypermetabolic right hilar mass worrisome for central bronchogenic carcinoma or metastatic disease to the right hilar lymph nodes. No other  suspicious metabolic activity and the small nodules in the right middle lobe demonstrate no abnormal metabolic activity. The left lung findings are favored to be post inflammatory. There was no evidence of metastatic disease within the abdomen or pelvis.  The patient presented to Dr. Julien Nordmann on 12/30/15 who recommended a brain MRI for staging work up. Brain MRI on 01/01/16 was negative for metastatic disease. Consultation with Dr. Servando Snare on 12/30/15 who recommended proceeding with bronchoscopy and EBUS with biopsy. Fine needle aspirations of 10R lymph nodes #1 and #2 on 12/31/15 revealed malignant cells consistent with non-small cell carcinoma. Adenocarcinoma was slightly favored and there was no material for additional studies.  The patient, his wife, and daughter present today in multidisciplinary clinic to discuss treatment options for the management of his disease.  PREVIOUS RADIATION THERAPY: No  PAST MEDICAL HISTORY:  has a past medical history of Arthritis; Complication of anesthesia; Full dentures; GERD (gastroesophageal reflux disease); HOH (hard of hearing); HTN (hypertension); Hyperlipidemia; Lung mass (12/30/2015); Psoriatic arthritis ([place]); Snores; and Wears glasses.    PAST SURGICAL HISTORY: Past Surgical History:  Procedure Laterality Date  . COLONOSCOPY    . HEMORRHOID SURGERY N/A 10/01/2013   Procedure: EXAM UNDER ANESTHESIA  AND EXCISIONAL HEMORRHOIDECTOMY WITH HEMORRHOID BANDING X 2;  Surgeon: Gayland Curry, MD;  Location: [facility];  Service: General;  Laterality: N/A;  . INGUINAL HERNIA REPAIR  01/04/2012   Procedure: LAPAROSCOPIC INGUINAL HERNIA;  Surgeon: Gayland Curry, MD,FACS;  Location: [facility];  Service: General;  Laterality: Right;  Marland Kitchen VIDEO BRONCHOSCOPY WITH ENDOBRONCHIAL ULTRASOUND N/A 12/31/2015   Procedure: VIDEO  BRONCHOSCOPY WITH ENDOBRONCHIAL ULTRASOUND transbronchial biopsy of node 10 R lymph node;  Surgeon: Grace Isaac, MD;  Location: [facility] OR;  Service:  Thoracic;  Laterality: N/A;    FAMILY HISTORY: family history includes Cancer in his brother; Diabetes in his father; Heart disease in his father; Stroke in his father.  SOCIAL HISTORY:  reports that he has been smoking Cigarettes.  He has a 25.00 pack-year smoking history. He has never used smokeless tobacco. He reports that he drinks alcohol. He reports that he does not use drugs.  ALLERGIES: Hydrocodone and Oxycodone  MEDICATIONS:  Current Outpatient Prescriptions  Medication Sig Dispense Refill  . acetaminophen (TYLENOL) 650 MG CR tablet Take 650-1,300 mg by mouth 2 (two) times daily as needed for pain.    Marland Kitchen aspirin EC 81 MG tablet Take 81 mg by mouth at bedtime.     . diphenhydramine-acetaminophen (TYLENOL PM) 25-500 MG TABS Take 1 tablet by mouth at bedtime. For sleep    . ENBREL SURECLICK 50 MG/ML injection Inject 50 mg into the skin once a week. Tuesday    . Multiple Vitamin (MULITIVITAMIN WITH MINERALS) TABS Take 1 tablet by mouth daily.    Marland Kitchen OVER THE COUNTER MEDICATION Take 1 tablet by mouth 2 (two) times daily. Relief- healthy joint function    . pantoprazole (PROTONIX) 40 MG tablet Take 40 mg by mouth every other day.     . pravastatin (PRAVACHOL) 40 MG tablet Take 40 mg by mouth daily.     . predniSONE (DELTASONE) 10 MG tablet Take 5 mg by mouth every other day.     . prochlorperazine (COMPAZINE) 10 MG tablet Take 1 tablet (10 mg total) by mouth every 6 (six) hours as needed for nausea or vomiting. 30 tablet 0  . tamsulosin (FLOMAX) 0.4 MG CAPS capsule TAKE (1) CAPSULE DAILY, START 4 DAYS BEFORE PROCEDURE (Patient taking differently: take 1 capsule by mouth every morning) 30 capsule 2   No current facility-administered medications for this encounter.     REVIEW OF SYSTEMS:  A 15 point review of systems is documented in the electronic medical record. This was obtained by the nursing staff. However, I reviewed this with the patient to discuss relevant findings and make  appropriate changes.  Pertinent items noted in HPI and remainder of comprehensive ROS otherwise negative.   The patient notices some fatigue.   PHYSICAL EXAM:  vitals were not taken for this visit.  Vitals with BMI 01/13/2016  Height '6\' 1"'$   Weight 175 lbs 6 oz  BMI 53.2  Systolic 992  Diastolic 84  Pulse 68  Respirations 17  General: Alert and oriented, in no acute distress HEENT: Head is normocephalic. Extraocular movements are intact. Oropharynx is clear. The patient wears dentures. Neck: Neck is supple, no palpable cervical or supraclavicular lymphadenopathy. Heart: Regular in rate and rhythm with no murmurs, rubs, or gallops. Chest: Clear to auscultation bilaterally, with no rhonchi, wheezes, or rales. Abdomen: Soft, nontender, nondistended, with no rigidity or guarding. Extremities: No cyanosis or edema. Lymphatics: see Neck Exam Skin: No concerning lesions. Musculoskeletal: symmetric strength and muscle tone throughout. Neurologic: Cranial nerves II through XII are grossly intact. No obvious focalities. Speech is fluent. Coordination is intact. Psychiatric: Judgment and insight are intact. Affect is appropriate.  ECOG = 1  LABORATORY DATA:  Lab Results  Component Value Date   WBC 8.2 01/13/2016   HGB 14.6 01/13/2016   HCT 41.1 01/13/2016   MCV 88.6 01/13/2016   PLT 137 (  L) 01/13/2016   NEUTROABS 5.4 01/13/2016   Lab Results  Component Value Date   NA 139 01/13/2016   K 4.1 01/13/2016   CL 103 12/31/2015   CO2 25 01/13/2016   GLUCOSE 119 01/13/2016   CREATININE 1.1 01/13/2016   CALCIUM 9.5 01/13/2016      RADIOGRAPHY: Ct Chest W Contrast  Result Date: 12/17/2015 CLINICAL DATA:  Abnormal chest x-ray, history of tobacco use EXAM: CT CHEST WITH CONTRAST TECHNIQUE: Multidetector CT imaging of the chest was performed during intravenous contrast administration. CONTRAST:  69m ISOVUE-300 IOPAMIDOL (ISOVUE-300) INJECTION 61% COMPARISON:  12/03/2015 FINDINGS:  Cardiovascular: The thoracic aorta shows calcific changes without aneurysmal dilatation or dissection. The pulmonary artery as visualized shows no evidence of pulmonary emboli. Coronary calcifications are noted. The cardiac structures are otherwise within normal limits. Mediastinum/Nodes: The thoracic inlet is within normal limits. No significant mediastinal adenopathy is seen. Right hilar adenopathy is identified measuring 15 by 21 mm in greatest dimension. Lungs/Pleura: The lungs are well aerated bilaterally and demonstrate some mild emphysematous changes. In the right lung there are scattered nodules identified the largest of which lies within the right middle lobe measuring 6 mm. A few smaller nodules are seen. The left lung also shows nodular areas. There are 2 dominant nodules which appears somewhat linked by a intervening soft tissue. The more inferior of these is noted on image number 106 of series 5 and measures approximately 11 by 14 mm in greatest dimension. Superior to this and interconnected by intervening soft tissue is an area of nodular density measuring at least 14 by 16 mm. Upper Abdomen: No acute abnormality noted. Musculoskeletal: Degenerative changes of the thoracic spine IMPRESSION: Bilateral pulmonary nodules with evidence of right hilar adenopathy. Neoplasm is felt to be highly likely. PET-CT may be helpful for further evaluation. These results will be called to the ordering clinician or representative by the Radiologist Assistant, and communication documented in the PACS or zVision Dashboard. Electronically Signed   By: MInez CatalinaM.D.   On: 12/17/2015 12:02   Mr BJeri CosWWEContrast  Result Date: 01/01/2016 CLINICAL DATA:  75year old male recently diagnosed with malignant right hilar mass on PET-CT. Staging. Subsequent encounter. EXAM: MRI HEAD WITHOUT AND WITH CONTRAST TECHNIQUE: Multiplanar, multiecho pulse sequences of the brain and surrounding structures were obtained without and  with intravenous contrast. CONTRAST:  140mMULTIHANCE GADOBENATE DIMEGLUMINE 529 MG/ML IV SOLN COMPARISON:  PET-CT 12/27/2015. FINDINGS: No abnormal enhancement identified. No midline shift, mass effect, or evidence of intracranial mass lesion. No dural thickening. Bone marrow signal in the calvarium is heterogeneous, but no destructive osseous lesion is identified. Negative for age visible cervical spine and spinal cord. Cerebral volume is within normal limits for age. No restricted diffusion to suggest acute infarction. No ventriculomegaly, extra-axial collection or acute intracranial hemorrhage. Cervicomedullary junction and pituitary are within normal limits. Major intracranial vascular flow voids are preserved; there is evidence of flow limiting distal right vertebral artery atherosclerosis. GrPearline Cablesnd white matter signal is within normal limits for age throughout the brain. Visible internal auditory structures appear normal. Mild to moderate right ethmoid sinus mucosal thickening. Trace right mastoid fluid. Other Visualized paranasal sinuses and mastoids are well pneumatized. Negative orbit and scalp soft tissues. IMPRESSION: 1. No metastatic disease or acute intracranial abnormality. Largely unremarkable for age MRI appearance of the brain. 2. 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 3. Right ethmoid sinus inflammation. Electronically Signed   By: H Herminio Heads.  On: 01/01/2016 12:21   Nm Pet Image Initial (pi) Skull Base To Thigh  Result Date: 12/27/2015 CLINICAL DATA:  Initial treatment strategy for bilateral pulmonary nodules on CT. EXAM: NUCLEAR MEDICINE PET SKULL BASE TO THIGH TECHNIQUE: 8.46 mCi F-18 FDG was injected intravenously. Full-ring PET imaging was performed from the skull base to thigh after the radiotracer. CT data was obtained and used for attenuation correction and anatomic localization. FASTING BLOOD GLUCOSE:  Value: 114 Mg/dl COMPARISON:  Abdominal pelvic  CT 12/03/2015.  Chest CT 12/17/2015. FINDINGS: NECK No hypermetabolic cervical lymph nodes are identified.There are no lesions of the pharyngeal mucosal space. Chronic atherosclerosis noted. CHEST Approximately 2.8 cm right hilar mass is hypermetabolic with an SUV max of 13.7. No other hypermetabolic mediastinal, hilar or axillary lymph nodes are seen. There are no hypermetabolic pulmonary nodules. Specifically, no abnormal activity is seen associated with the 6 mm right middle lobe nodule on image 44 or the 9 x 5 mm right middle lobe nodule on image 46. These nodules are suboptimally evaluated based on size. The irregular left lung densities demonstrate no abnormal metabolic activity, favored to reflect postinflammatory scarring based on morphology. Moderate emphysematous changes are present. ABDOMEN/PELVIS There is no hypermetabolic activity within the liver, adrenal glands, spleen or pancreas. There is no hypermetabolic nodal activity. The liver demonstrates mildly decreased density suspicious for steatosis. There are bilateral nonobstructing renal calculi and cortical scarring in both kidneys. There are diverticular changes within the sigmoid colon, enlargement of the prostate gland and diffuse bladder wall thickening. There is aortic and branch vessel atherosclerosis. SKELETON There is no hypermetabolic activity to suggest osseous metastatic disease. IMPRESSION: 1. Hypermetabolic right hilar mass worrisome for central bronchogenic carcinoma or metastatic disease to right hilar lymph nodes. Bronchoscopy suggested for tissue sampling. 2. No other suspicious metabolic activity. The small nodules in the right middle lobe demonstrate no abnormal metabolic activity, although are not optimally evaluated based on size. The left lung findings are favored to be postinflammatory. 3. No evidence of metastatic disease within the abdomen or pelvis. 4. Bilateral nephrolithiasis and diverticulosis of the distal colon.  Electronically Signed   By: Richardean Sale M.D.   On: 12/27/2015 14:31      IMPRESSION: Clinical stage IIA (T1a, N1, M0) adenocarcinoma of the right lung  We discussed his diagnosis and stage. We discussed pre-op chemoradiation in the management of his disease. We discussed 5 weeks of treatment as an outpatient. We discussed the process of CT simulation and the placement of tattoos. We discussed dysphagia, weight loss and fatigue as the acute side effects of radiation. We discussed damage to critical normal structures in the chest as well as the spinal cord as possible but improbably.  PLAN: The patient signed a consent form and this was placed in his medical chart. CT simulation is scheduled on 01/18/16 at 9AM.     ------------------------------------------------  Blair Promise, PhD, MD  This document serves as a record of services personally performed by Gery Pray, MD. It was created on his behalf by Darcus Austin, a trained medical scribe. The creation of this record is based on the scribe's personal observations and the provider's statements to them. This document has been checked and approved by the attending provider.

## 2016-01-13 NOTE — Progress Notes (Signed)
[facility] [phone]   Fax:([phone]) [phone]   Multidisciplinary thoracic oncology clinic  OFFICE PROGRESS NOTE  Sherrie Mustache, MD [place] [address]  DIAGNOSIS: Stage IIA (T1a, N1, M0) non-small cell lung cancer, adenocarcinoma presented with right hilar mass with hilar lymphadenopathy diagnosed in September 2017.  PRIOR THERAPY: None  CURRENT THERAPY: Concurrent chemoradiation with weekly carboplatin for AUC of 2 and paclitaxel 45 MG/M2, first cycle 01/24/2016.  INTERVAL HISTORY: Mark Howell 75 y.o. male returns to the clinic today for follow-up visit accompanied by his wife and daughter. The patient is feeling fine today with no specific complaints except for mild shortness breath. He denied having any significant chest pain, cough or hemoptysis. He has no weight loss or night sweats. He has no nausea, vomiting, diarrhea or constipation. He was seen recently by Dr. Servando Snare and underwent bronchoscopy with endobronchial ultrasound and biopsy of the right hilar mass/lymph node and the final pathology (Accession: [id number]) non-small cell lung cancer, favoring adenocarcinoma. He is currently not a good surgical candidate for resection. He was referred back to me by Dr. Servando Snare for consideration of neoadjuvant concurrent chemoradiation. He is here today for evaluation and discussion of this option.  MEDICAL HISTORY: Past Medical History:  Diagnosis Date  . Arthritis   . Complication of anesthesia    problems voiding post op  . Full dentures   . GERD (gastroesophageal reflux disease)   . HOH (hard of hearing)   . HTN (hypertension)   . Hyperlipidemia   . Lung mass 12/30/2015  . Psoriatic arthritis ([place])   . Snores   . Wears glasses     ALLERGIES:  is allergic to hydrocodone and oxycodone.  MEDICATIONS:  Current Outpatient Prescriptions  Medication Sig Dispense Refill  . acetaminophen (TYLENOL) 650 MG CR tablet  Take 650-1,300 mg by mouth 2 (two) times daily as needed for pain.    Marland Kitchen aspirin EC 81 MG tablet Take 81 mg by mouth at bedtime.     . diphenhydramine-acetaminophen (TYLENOL PM) 25-500 MG TABS Take 1 tablet by mouth at bedtime. For sleep    . ENBREL SURECLICK 50 MG/ML injection Inject 50 mg into the skin once a week. Tuesday    . Multiple Vitamin (MULITIVITAMIN WITH MINERALS) TABS Take 1 tablet by mouth daily.    Marland Kitchen OVER THE COUNTER MEDICATION Take 1 tablet by mouth 2 (two) times daily. Relief- healthy joint function    . pantoprazole (PROTONIX) 40 MG tablet Take 40 mg by mouth every other day.     . pravastatin (PRAVACHOL) 40 MG tablet Take 40 mg by mouth daily.     . predniSONE (DELTASONE) 10 MG tablet Take 5 mg by mouth every other day.     . tamsulosin (FLOMAX) 0.4 MG CAPS capsule TAKE (1) CAPSULE DAILY, START 4 DAYS BEFORE PROCEDURE (Patient taking differently: take 1 capsule by mouth every morning) 30 capsule 2   No current facility-administered medications for this visit.     SURGICAL HISTORY:  Past Surgical History:  Procedure Laterality Date  . COLONOSCOPY    . HEMORRHOID SURGERY N/A 10/01/2013   Procedure: EXAM UNDER ANESTHESIA  AND EXCISIONAL HEMORRHOIDECTOMY WITH HEMORRHOID BANDING X 2;  Surgeon: Gayland Curry, MD;  Location: [facility];  Service: General;  Laterality: N/A;  . INGUINAL HERNIA REPAIR  01/04/2012   Procedure: LAPAROSCOPIC INGUINAL HERNIA;  Surgeon: Gayland Curry, MD,FACS;  Location: [facility];  Service: General;  Laterality: Right;  Marland Kitchen VIDEO BRONCHOSCOPY WITH ENDOBRONCHIAL ULTRASOUND N/A 12/31/2015   Procedure: VIDEO BRONCHOSCOPY WITH ENDOBRONCHIAL ULTRASOUND transbronchial biopsy of node 10 R lymph node;  Surgeon: Grace Isaac, MD;  Location: [facility];  Service: Thoracic;  Laterality: N/A;    REVIEW OF SYSTEMS:  Constitutional: negative Eyes: negative Ears, nose, mouth, throat, and face: negative Respiratory: positive for dyspnea on  exertion Cardiovascular: negative Gastrointestinal: negative Genitourinary:negative Integument/breast: negative Hematologic/lymphatic: negative Musculoskeletal:negative Neurological: negative Behavioral/Psych: negative Endocrine: negative Allergic/Immunologic: negative   PHYSICAL EXAMINATION: General appearance: alert, cooperative, fatigued and no distress Head: Normocephalic, without obvious abnormality, atraumatic Neck: no adenopathy, no JVD, supple, symmetrical, trachea midline and thyroid not enlarged, symmetric, no tenderness/mass/nodules Lymph nodes: Cervical, supraclavicular, and axillary nodes normal. Resp: clear to auscultation bilaterally Back: symmetric, no curvature. ROM normal. No CVA tenderness. Cardio: regular rate and rhythm, S1, S2 normal, no murmur, click, rub or gallop GI: soft, non-tender; bowel sounds normal; no masses,  no organomegaly Extremities: extremities normal, atraumatic, no cyanosis or edema Neurologic: Alert and oriented X 3, normal strength and tone. Normal symmetric reflexes. Normal coordination and gait  ECOG PERFORMANCE STATUS: 1 - Symptomatic but completely ambulatory  Blood pressure (!) 147/84, pulse 68, temperature 98.4 F (36.9 C), temperature source Oral, resp. rate 17, height '6\' 1"'$  (1.854 m), weight 175 lb 6.4 oz (79.6 kg), SpO2 99 %.  LABORATORY DATA: Lab Results  Component Value Date   WBC 8.2 01/13/2016   HGB 14.6 01/13/2016   HCT 41.1 01/13/2016   MCV 88.6 01/13/2016   PLT 137 (L) 01/13/2016      Chemistry      Component Value Date/Time   NA 139 01/13/2016 1458   K 4.1 01/13/2016 1458   CL 103 12/31/2015 0732   CO2 25 01/13/2016 1458   BUN 14.3 01/13/2016 1458   CREATININE 1.1 01/13/2016 1458      Component Value Date/Time   CALCIUM 9.5 01/13/2016 1458   ALKPHOS 111 01/13/2016 1458   AST 18 01/13/2016 1458   ALT 13 01/13/2016 1458   BILITOT 0.38 01/13/2016 1458       RADIOGRAPHIC STUDIES: Ct Chest W  Contrast  Result Date: 12/17/2015 CLINICAL DATA:  Abnormal chest x-ray, history of tobacco use EXAM: CT CHEST WITH CONTRAST TECHNIQUE: Multidetector CT imaging of the chest was performed during intravenous contrast administration. CONTRAST:  42m ISOVUE-300 IOPAMIDOL (ISOVUE-300) INJECTION 61% COMPARISON:  12/03/2015 FINDINGS: Cardiovascular: The thoracic aorta shows calcific changes without aneurysmal dilatation or dissection. The pulmonary artery as visualized shows no evidence of pulmonary emboli. Coronary calcifications are noted. The cardiac structures are otherwise within normal limits. Mediastinum/Nodes: The thoracic inlet is within normal limits. No significant mediastinal adenopathy is seen. Right hilar adenopathy is identified measuring 15 by 21 mm in greatest dimension. Lungs/Pleura: The lungs are well aerated bilaterally and demonstrate some mild emphysematous changes. In the right lung there are scattered nodules identified the largest of which lies within the right middle lobe measuring 6 mm. A few smaller nodules are seen. The left lung also shows nodular areas. There are 2 dominant nodules which appears somewhat linked by a intervening soft tissue. The more inferior of these is noted on image number 106 of series 5 and measures approximately 11 by 14 mm in greatest dimension. Superior to this and interconnected by intervening soft tissue is an area of nodular density measuring at least 14 by 16 mm. Upper Abdomen: No acute abnormality noted. Musculoskeletal: Degenerative changes of the thoracic spine IMPRESSION: Bilateral  pulmonary nodules with evidence of right hilar adenopathy. Neoplasm is felt to be highly likely. PET-CT may be helpful for further evaluation. These results will be called to the ordering clinician or representative by the Radiologist Assistant, and communication documented in the PACS or zVision Dashboard. Electronically Signed   By: Inez Catalina M.D.   On: 12/17/2015 12:02   Mr  Jeri Cos WU Contrast  Result Date: 01/01/2016 CLINICAL DATA:  76 year old male recently diagnosed with malignant right hilar mass on PET-CT. Staging. Subsequent encounter. EXAM: MRI HEAD WITHOUT AND WITH CONTRAST TECHNIQUE: Multiplanar, multiecho pulse sequences of the brain and surrounding structures were obtained without and with intravenous contrast. CONTRAST:  63m MULTIHANCE GADOBENATE DIMEGLUMINE 529 MG/ML IV SOLN COMPARISON:  PET-CT 12/27/2015. FINDINGS: No abnormal enhancement identified. No midline shift, mass effect, or evidence of intracranial mass lesion. No dural thickening. Bone marrow signal in the calvarium is heterogeneous, but no destructive osseous lesion is identified. Negative for age visible cervical spine and spinal cord. Cerebral volume is within normal limits for age. No restricted diffusion to suggest acute infarction. No ventriculomegaly, extra-axial collection or acute intracranial hemorrhage. Cervicomedullary junction and pituitary are within normal limits. Major intracranial vascular flow voids are preserved; there is evidence of flow limiting distal right vertebral artery atherosclerosis. GPearline Cablesand white matter signal is within normal limits for age throughout the brain. Visible internal auditory structures appear normal. Mild to moderate right ethmoid sinus mucosal thickening. Trace right mastoid fluid. Other Visualized paranasal sinuses and mastoids are well pneumatized. Negative orbit and scalp soft tissues. IMPRESSION: 1. No metastatic disease or acute intracranial abnormality. Largely unremarkable for age MRI appearance of the brain. 2. 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 3. Right ethmoid sinus inflammation. Electronically Signed   By: HGenevie AnnM.D.   On: 01/01/2016 12:21   Nm Pet Image Initial (pi) Skull Base To Thigh  Result Date: 12/27/2015 CLINICAL DATA:  Initial treatment strategy for bilateral pulmonary nodules on CT. EXAM: NUCLEAR  MEDICINE PET SKULL BASE TO THIGH TECHNIQUE: 8.46 mCi F-18 FDG was injected intravenously. Full-ring PET imaging was performed from the skull base to thigh after the radiotracer. CT data was obtained and used for attenuation correction and anatomic localization. FASTING BLOOD GLUCOSE:  Value: 114 Mg/dl COMPARISON:  Abdominal pelvic CT 12/03/2015.  Chest CT 12/17/2015. FINDINGS: NECK No hypermetabolic cervical lymph nodes are identified.There are no lesions of the pharyngeal mucosal space. Chronic atherosclerosis noted. CHEST Approximately 2.8 cm right hilar mass is hypermetabolic with an SUV max of 13.7. No other hypermetabolic mediastinal, hilar or axillary lymph nodes are seen. There are no hypermetabolic pulmonary nodules. Specifically, no abnormal activity is seen associated with the 6 mm right middle lobe nodule on image 44 or the 9 x 5 mm right middle lobe nodule on image 46. These nodules are suboptimally evaluated based on size. The irregular left lung densities demonstrate no abnormal metabolic activity, favored to reflect postinflammatory scarring based on morphology. Moderate emphysematous changes are present. ABDOMEN/PELVIS There is no hypermetabolic activity within the liver, adrenal glands, spleen or pancreas. There is no hypermetabolic nodal activity. The liver demonstrates mildly decreased density suspicious for steatosis. There are bilateral nonobstructing renal calculi and cortical scarring in both kidneys. There are diverticular changes within the sigmoid colon, enlargement of the prostate gland and diffuse bladder wall thickening. There is aortic and branch vessel atherosclerosis. SKELETON There is no hypermetabolic activity to suggest osseous metastatic disease. IMPRESSION: 1. Hypermetabolic right hilar mass worrisome for  central bronchogenic carcinoma or metastatic disease to right hilar lymph nodes. Bronchoscopy suggested for tissue sampling. 2. No other suspicious metabolic activity. The small  nodules in the right middle lobe demonstrate no abnormal metabolic activity, although are not optimally evaluated based on size. The left lung findings are favored to be postinflammatory. 3. No evidence of metastatic disease within the abdomen or pelvis. 4. Bilateral nephrolithiasis and diverticulosis of the distal colon. Electronically Signed   By: Richardean Sale M.D.   On: 12/27/2015 14:31    ASSESSMENT AND PLAN: This is a very pleasant 75 years old white male recently diagnosed with a stage II a (T1a, N1, M0) non-small cell lung cancer favoring adenocarcinoma presented with right hilar mass and right hilar adenopathy diagnosed in September 2017. The patient is not currently a good surgical candidate for resection because of the central location of his tumor and he may require right pneumonectomy if he proceeded with surgery at this point. His case was discussed at the weekly thoracic conference and Dr. Servando Snare would like the patient to have a course of neoadjuvant concurrent chemoradiation before reevaluation for surgical resection. I had a lengthy discussion with the patient and his family about his current condition and this treatment options. I recommended for the patient a course of concurrent chemoradiation with weekly carboplatin for AUC of 2 and paclitaxel 45 MG/M2. I discussed with the patient adverse effect of the chemotherapy including but not limited to alopecia, myelosuppression, nausea and vomiting, peripheral neuropathy, liver or renal dysfunction. I will arrange for the patient to have a chemotherapy education class before starting the first dose of his chemotherapy. He will be seen later today by Dr. Sondra Come from radiation oncology for discussion of the radiotherapy option. He is expected to start the first dose of concurrent chemoradiation on 01/24/2016. I will call his pharmacy with prescription for Compazine 10 mg by mouth every 6 hours as needed for nausea. I would see the patient  back for follow-up visit in 3 weeks for reevaluation and management of any adverse effect of his treatment. He was seen during the multidisciplinary thoracic oncology clinic today by medical oncology, radiation oncology, thoracic navigator, social worker and physical therapist. The patient voices understanding of current disease status and treatment options and is in agreement with the current care plan.  All questions were answered. The patient knows to call the clinic with any problems, questions or concerns. We can certainly see the patient much sooner if necessary.  I spent 20 minutes counseling the patient face to face. The total time spent in the appointment was 30 minutes.  Disclaimer: This note was dictated with voice recognition software. Similar sounding words can inadvertently be transcribed and may not be corrected upon review.

## 2016-01-14 ENCOUNTER — Encounter: Payer: Self-pay | Admitting: Internal Medicine

## 2016-01-18 ENCOUNTER — Other Ambulatory Visit: Payer: Self-pay | Admitting: Internal Medicine

## 2016-01-18 ENCOUNTER — Telehealth: Payer: Self-pay | Admitting: *Deleted

## 2016-01-18 ENCOUNTER — Ambulatory Visit
Admission: RE | Admit: 2016-01-18 | Discharge: 2016-01-18 | Disposition: A | Discharge status: Home / Self Care | Payer: Medicare Other | Source: Ambulatory Visit | Attending: Radiation Oncology | Admitting: Radiation Oncology

## 2016-01-18 DIAGNOSIS — Z510 Encounter for antineoplastic radiation therapy: Secondary | ICD-10-CM | POA: Diagnosis not present

## 2016-01-18 DIAGNOSIS — C3491 Malignant neoplasm of unspecified part of right bronchus or lung: Secondary | ICD-10-CM | POA: Insufficient documentation

## 2016-01-18 DIAGNOSIS — R590 Localized enlarged lymph nodes: Secondary | ICD-10-CM | POA: Diagnosis not present

## 2016-01-18 NOTE — Telephone Encounter (Signed)
Call from [facility] with request for pt appts with MD/chemo going forward. Per MD last office  Note 9/14- pt to f/u in 3 weeks after 1st treatment on 9/25. Message to scheduling for pt have lab/md infusion Oct 2

## 2016-01-18 NOTE — Telephone Encounter (Signed)
Chemotherapy ordered for 01/24/2016

## 2016-01-18 NOTE — Progress Notes (Signed)
  Radiation Oncology         [phone] ________________________________  Name: Mark Howell MRN: [id number]  Date: 01/18/2016  DOB: 1940-05-29  SIMULATION AND TREATMENT PLANNING NOTE    ICD-9-CM ICD-10-CM   1. Adenocarcinoma of right lung, stage 2 (HCC) 162.9 C34.91     DIAGNOSIS:  Clinical stage IIA(T1a, N1, M0) adenocarcinoma of the right lung  NARRATIVE:  The patient was brought to the [facility].  Identity was confirmed.  All relevant records and images related to the planned course of therapy were reviewed.  The patient freely provided informed written consent to proceed with treatment after reviewing the details related to the planned course of therapy. The consent form was witnessed and verified by the simulation staff.  Then, the patient was set-up in a stable reproducible  supine position for radiation therapy.  CT images were obtained.  Surface markings were placed.  The CT images were loaded into the planning software.  Then the target and avoidance structures were contoured.  Treatment planning then occurred.  The radiation prescription was entered and confirmed.  Then, I designed and supervised the construction of a total of 6 medically necessary complex treatment devices.  I have requested : 3D Simulation  I have requested a DVH of the following structures: heart, lungs, GTV, PTV, spinal cord.  I have ordered:dose calc.  PLAN:  The patient will receive 45 Gy in 25 fractions (pre-op) along with radiosensitizing chemotherapy. Patient will then be considered for surgery.   Special Treatment Procedure Note: The patient will be receiving radiosensitizing chemotherapy. Given the potential of increased toxicities related to combined therapy and the necessity for close monitoring of the patient and blood work, this constitutes a special treatment procedure.   -----------------------------------  Blair Promise, PhD, MD  This document serves as a record of  services personally performed by Gery Pray, MD. It was created on his behalf by Truddie Hidden, a trained medical scribe. The creation of this record is based on the scribe's personal observations and the provider's statements to them. This document has been checked and approved by the attending provider.

## 2016-01-18 NOTE — Telephone Encounter (Signed)
Received a call from radiation regarding patient chemo appts. No appts made in computer, message forwarded to desk RN and MD.

## 2016-01-19 ENCOUNTER — Encounter: Payer: Self-pay | Admitting: *Deleted

## 2016-01-19 ENCOUNTER — Telehealth: Payer: Self-pay | Admitting: *Deleted

## 2016-01-19 DIAGNOSIS — Z510 Encounter for antineoplastic radiation therapy: Secondary | ICD-10-CM | POA: Diagnosis not present

## 2016-01-19 NOTE — Progress Notes (Signed)
Oncology Nurse Navigator Documentation  Oncology Nurse Navigator Flowsheets 01/19/2016  Navigator Encounter Type Other/I followed up on Mr. Eberwein's schedule.  He is due to start chemo on 01/31/16.  I updated Dr. Julien Nordmann.  He would like patient to have chemo on 01/24/16.  I updated scheduler.    Treatment Phase Treatment  Barriers/Navigation Needs Coordination of Care  Interventions Coordination of Care  Coordination of Care Appts  Acuity Level 2  Acuity Level 2 Assistance expediting appointments  Time Spent with Patient 30

## 2016-01-19 NOTE — Telephone Encounter (Signed)
Oncology Nurse Navigator Documentation  Oncology Nurse Navigator Flowsheets 01/19/2016  Navigator Encounter Type Other/I called patient with schedule change. I spoke to him and his wife.  They verbalized understanding of appt time and place.  Treatment Phase Treatment  Barriers/Navigation Needs Coordination of Care  Interventions Coordination of Care  Coordination of Care Appts  Acuity Level 2  Acuity Level 2 Assistance expediting appointments  Time Spent with Patient 30

## 2016-01-24 ENCOUNTER — Ambulatory Visit (HOSPITAL_BASED_OUTPATIENT_CLINIC_OR_DEPARTMENT_OTHER): Payer: Medicare Other

## 2016-01-24 ENCOUNTER — Other Ambulatory Visit (HOSPITAL_BASED_OUTPATIENT_CLINIC_OR_DEPARTMENT_OTHER): Payer: Medicare Other

## 2016-01-24 ENCOUNTER — Other Ambulatory Visit: Payer: Medicare Other

## 2016-01-24 VITALS — BP 169/95 | HR 68 | Temp 98.5°F | Resp 17

## 2016-01-24 DIAGNOSIS — C3401 Malignant neoplasm of right main bronchus: Secondary | ICD-10-CM

## 2016-01-24 DIAGNOSIS — C3491 Malignant neoplasm of unspecified part of right bronchus or lung: Secondary | ICD-10-CM

## 2016-01-24 DIAGNOSIS — Z5111 Encounter for antineoplastic chemotherapy: Secondary | ICD-10-CM | POA: Diagnosis not present

## 2016-01-24 LAB — CBC WITH DIFFERENTIAL/PLATELET
BASO%: 0.6 % (ref 0.0–2.0)
Basophils Absolute: 0.1 10*3/uL (ref 0.0–0.1)
EOS ABS: 0.2 10*3/uL (ref 0.0–0.5)
EOS%: 2.7 % (ref 0.0–7.0)
HEMATOCRIT: 43.6 % (ref 38.4–49.9)
HEMOGLOBIN: 14.8 g/dL (ref 13.0–17.1)
LYMPH#: 1.5 10*3/uL (ref 0.9–3.3)
LYMPH%: 17.3 % (ref 14.0–49.0)
MCH: 30.5 pg (ref 27.2–33.4)
MCHC: 34 g/dL (ref 32.0–36.0)
MCV: 89.7 fL (ref 79.3–98.0)
MONO#: 1 10*3/uL — AB (ref 0.1–0.9)
MONO%: 11.3 % (ref 0.0–14.0)
NEUT%: 68.1 % (ref 39.0–75.0)
NEUTROS ABS: 6 10*3/uL (ref 1.5–6.5)
PLATELETS: 148 10*3/uL (ref 140–400)
RBC: 4.86 10*6/uL (ref 4.20–5.82)
RDW: 12.8 % (ref 11.0–14.6)
WBC: 8.9 10*3/uL (ref 4.0–10.3)

## 2016-01-24 LAB — COMPREHENSIVE METABOLIC PANEL
ALBUMIN: 3.7 g/dL (ref 3.5–5.0)
ALK PHOS: 110 U/L (ref 40–150)
ALT: 17 U/L (ref 0–55)
ANION GAP: 11 meq/L (ref 3–11)
AST: 22 U/L (ref 5–34)
BILIRUBIN TOTAL: 0.38 mg/dL (ref 0.20–1.20)
BUN: 13.8 mg/dL (ref 7.0–26.0)
CALCIUM: 9.5 mg/dL (ref 8.4–10.4)
CO2: 21 mEq/L — ABNORMAL LOW (ref 22–29)
CREATININE: 1.1 mg/dL (ref 0.7–1.3)
Chloride: 107 mEq/L (ref 98–109)
EGFR: 64 mL/min/{1.73_m2} — AB (ref >90)
Glucose: 141 mg/dl — ABNORMAL HIGH (ref 70–140)
Potassium: 4 mEq/L (ref 3.5–5.1)
Sodium: 139 mEq/L (ref 136–145)
TOTAL PROTEIN: 7.9 g/dL (ref 6.4–8.3)

## 2016-01-24 MED ORDER — SODIUM CHLORIDE 0.9 % IV SOLN
45.0000 mg/m2 | Freq: Once | INTRAVENOUS | Status: AC
  Administered 2016-01-24: 90 mg via INTRAVENOUS
  Filled 2016-01-24: qty 15

## 2016-01-24 MED ORDER — DIPHENHYDRAMINE HCL 50 MG/ML IJ SOLN
50.0000 mg | Freq: Once | INTRAMUSCULAR | Status: AC
  Administered 2016-01-24: 50 mg via INTRAVENOUS

## 2016-01-24 MED ORDER — SODIUM CHLORIDE 0.9 % IV SOLN
20.0000 mg | Freq: Once | INTRAVENOUS | Status: AC
  Administered 2016-01-24: 20 mg via INTRAVENOUS
  Filled 2016-01-24: qty 2

## 2016-01-24 MED ORDER — PALONOSETRON HCL INJECTION 0.25 MG/5ML
INTRAVENOUS | Status: AC
  Filled 2016-01-24: qty 5

## 2016-01-24 MED ORDER — CLONIDINE HCL 0.1 MG PO TABS
0.2000 mg | ORAL_TABLET | Freq: Once | ORAL | Status: AC
  Administered 2016-01-24: 0.2 mg via ORAL

## 2016-01-24 MED ORDER — FAMOTIDINE IN NACL 20-0.9 MG/50ML-% IV SOLN
INTRAVENOUS | Status: AC
  Filled 2016-01-24: qty 50

## 2016-01-24 MED ORDER — SODIUM CHLORIDE 0.9 % IV SOLN
180.6000 mg | Freq: Once | INTRAVENOUS | Status: AC
  Administered 2016-01-24: 180 mg via INTRAVENOUS
  Filled 2016-01-24: qty 18

## 2016-01-24 MED ORDER — DEXTROSE 5 % IV SOLN: 45.0000 mg/m2 | Freq: Once | INTRAVENOUS | Status: DC

## 2016-01-24 MED ORDER — PALONOSETRON HCL INJECTION 0.25 MG/5ML
0.2500 mg | Freq: Once | INTRAVENOUS | Status: AC
  Administered 2016-01-24: 0.25 mg via INTRAVENOUS

## 2016-01-24 MED ORDER — SODIUM CHLORIDE 0.9 % IV SOLN
Freq: Once | INTRAVENOUS | Status: AC
Start: 2016-01-24 — End: 2016-01-24
  Administered 2016-01-24: 14:00:00 via INTRAVENOUS

## 2016-01-24 MED ORDER — FAMOTIDINE IN NACL 20-0.9 MG/50ML-% IV SOLN
20.0000 mg | Freq: Once | INTRAVENOUS | Status: AC
  Administered 2016-01-24: 20 mg via INTRAVENOUS

## 2016-01-24 MED ORDER — DIPHENHYDRAMINE HCL 50 MG/ML IJ SOLN
INTRAMUSCULAR | Status: AC
  Filled 2016-01-24: qty 1

## 2016-01-24 NOTE — Progress Notes (Signed)
Received report from Amy, RN at 16:55 who had administered clonidine to pt at 1628.  Per Amy, RN Dr. Julien Nordmann aware of elevated BP and verbal order given to recheck BP prior to discharge as well as once pt gets home and to call us if BP does not normalize.  Upon completion of carboplatin infusion, BP remains elevated at 170/103.  Pt reported he had to use the bathroom badly so BP rechecked after returning to see if improvement noticed and BP 183/97.  Spoke with Selena Lesser, NP who recommends rechecking once more and okay to discharge if improvement.  Last BP noted at 169/95.  Discussed with pt and his wife in detail the importance of checking his BP once he returns home and reporting to ED should BP remain elevated or if pt has sudden onset of severe headache or other side effects.  Pt and wife verbalized understanding and are without further questions or concerns at time of discharge.  Pt discharged ambulatory with no complaints accompanied by wife.

## 2016-01-24 NOTE — Progress Notes (Signed)
Mark Howell aware of BP , pt still feels well. New medication order given and will proceed with Carboplatin now.

## 2016-01-24 NOTE — Patient Instructions (Signed)
[facility] Discharge Instructions for Patients Receiving Chemotherapy  Today you received the following chemotherapy agents:  Taxol, Carboplatin  To help prevent nausea and vomiting after your treatment, we encourage you to take your nausea medication as prescribed.   If you develop nausea and vomiting that is not controlled by your nausea medication, call the clinic.   BELOW ARE SYMPTOMS THAT SHOULD BE REPORTED IMMEDIATELY:  *FEVER GREATER THAN 100.5 F  *CHILLS WITH OR WITHOUT FEVER  NAUSEA AND VOMITING THAT IS NOT CONTROLLED WITH YOUR NAUSEA MEDICATION  *UNUSUAL SHORTNESS OF BREATH  *UNUSUAL BRUISING OR BLEEDING  TENDERNESS IN MOUTH AND THROAT WITH OR WITHOUT PRESENCE OF ULCERS  *URINARY PROBLEMS  *BOWEL PROBLEMS  UNUSUAL RASH Items with * indicate a potential emergency and should be followed up as soon as possible.  Feel free to call the clinic you have any questions or concerns. The clinic phone number is [phone].  Please show the [facility] at check-in to the Emergency Department and triage nurse.  Paclitaxel injection What is this medicine? PACLITAXEL (PAK li TAX el) is a chemotherapy drug. It targets fast dividing cells, like cancer cells, and causes these cells to die. This medicine is used to treat ovarian cancer, breast cancer, and other cancers. This medicine may be used for other purposes; ask your health care provider or pharmacist if you have questions. What should I tell my health care provider before I take this medicine? They need to know if you have any of these conditions: -blood disorders -irregular heartbeat -infection (especially a virus infection such as chickenpox, cold sores, or herpes) -liver disease -previous or ongoing radiation therapy -an unusual or allergic reaction to paclitaxel, alcohol, polyoxyethylated castor oil, other chemotherapy agents, other medicines, foods, dyes, or preservatives -pregnant or trying to  get pregnant -breast-feeding How should I use this medicine? This drug is given as an infusion into a vein. It is administered in a hospital or clinic by a specially trained health care professional. Talk to your pediatrician regarding the use of this medicine in children. Special care may be needed. Overdosage: If you think you have taken too much of this medicine contact a poison control center or emergency room at once. NOTE: This medicine is only for you. Do not share this medicine with others. What if I miss a dose? It is important not to miss your dose. Call your doctor or health care professional if you are unable to keep an appointment. What may interact with this medicine? Do not take this medicine with any of the following medications: -disulfiram -metronidazole This medicine may also interact with the following medications: -cyclosporine -diazepam -ketoconazole -medicines to increase blood counts like filgrastim, pegfilgrastim, sargramostim -other chemotherapy drugs like cisplatin, doxorubicin, epirubicin, etoposide, teniposide, vincristine -quinidine -testosterone -vaccines -verapamil Talk to your doctor or health care professional before taking any of these medicines: -acetaminophen -aspirin -ibuprofen -ketoprofen -naproxen This list may not describe all possible interactions. Give your health care provider a list of all the medicines, herbs, non-prescription drugs, or dietary supplements you use. Also tell them if you smoke, drink alcohol, or use illegal drugs. Some items may interact with your medicine. What should I watch for while using this medicine? Your condition will be monitored carefully while you are receiving this medicine. You will need important blood work done while you are taking this medicine. This drug may make you feel generally unwell. This is not uncommon, as chemotherapy can affect healthy cells as well  as cancer cells. Report any side effects.  Continue your course of treatment even though you feel ill unless your doctor tells you to stop. This medicine can cause serious allergic reactions. To reduce your risk you will need to take other medicine(s) before treatment with this medicine. In some cases, you may be given additional medicines to help with side effects. Follow all directions for their use. Call your doctor or health care professional for advice if you get a fever, chills or sore throat, or other symptoms of a cold or flu. Do not treat yourself. This drug decreases your body's ability to fight infections. Try to avoid being around people who are sick. This medicine may increase your risk to bruise or bleed. Call your doctor or health care professional if you notice any unusual bleeding. Be careful brushing and flossing your teeth or using a toothpick because you may get an infection or bleed more easily. If you have any dental work done, tell your dentist you are receiving this medicine. Avoid taking products that contain aspirin, acetaminophen, ibuprofen, naproxen, or ketoprofen unless instructed by your doctor. These medicines may hide a fever. Do not become pregnant while taking this medicine. Women should inform their doctor if they wish to become pregnant or think they might be pregnant. There is a potential for serious side effects to an unborn child. Talk to your health care professional or pharmacist for more information. Do not breast-feed an infant while taking this medicine. Men are advised not to father a child while receiving this medicine. This product may contain alcohol. Ask your pharmacist or healthcare provider if this medicine contains alcohol. Be sure to tell all healthcare providers you are taking this medicine. Certain medicines, like metronidazole and disulfiram, can cause an unpleasant reaction when taken with alcohol. The reaction includes flushing, headache, nausea, vomiting, sweating, and increased thirst. The  reaction can last from 30 minutes to several hours. What side effects may I notice from receiving this medicine? Side effects that you should report to your doctor or health care professional as soon as possible: -allergic reactions like skin rash, itching or hives, swelling of the face, lips, or tongue -low blood counts - This drug may decrease the number of white blood cells, red blood cells and platelets. You may be at increased risk for infections and bleeding. -signs of infection - fever or chills, cough, sore throat, pain or difficulty passing urine -signs of decreased platelets or bleeding - bruising, pinpoint red spots on the skin, black, tarry stools, nosebleeds -signs of decreased red blood cells - unusually weak or tired, fainting spells, lightheadedness -breathing problems -chest pain -high or low blood pressure -mouth sores -nausea and vomiting -pain, swelling, redness or irritation at the injection site -pain, tingling, numbness in the hands or feet -slow or irregular heartbeat -swelling of the ankle, feet, hands Side effects that usually do not require medical attention (report to your doctor or health care professional if they continue or are bothersome): -bone pain -complete hair loss including hair on your head, underarms, pubic hair, eyebrows, and eyelashes -changes in the color of fingernails -diarrhea -loosening of the fingernails -loss of appetite -muscle or joint pain -red flush to skin -sweating This list may not describe all possible side effects. Call your doctor for medical advice about side effects. You may report side effects to FDA at 1-800-FDA-1088. Where should I keep my medicine? This drug is given in a hospital or clinic and will not be stored at  home. NOTE: This sheet is a summary. It may not cover all possible information. If you have questions about this medicine, talk to your doctor, pharmacist, or health care provider.    2016, [employer]  Standard. (2014-12-03 13:02:56) Carboplatin injection What is this medicine? CARBOPLATIN (KAR boe pla tin) is a chemotherapy drug. It targets fast dividing cells, like cancer cells, and causes these cells to die. This medicine is used to treat ovarian cancer and many other cancers. This medicine may be used for other purposes; ask your health care provider or pharmacist if you have questions. What should I tell my health care provider before I take this medicine? They need to know if you have any of these conditions: -blood disorders -hearing problems -kidney disease -recent or ongoing radiation therapy -an unusual or allergic reaction to carboplatin, cisplatin, other chemotherapy, other medicines, foods, dyes, or preservatives -pregnant or trying to get pregnant -breast-feeding How should I use this medicine? This drug is usually given as an infusion into a vein. It is administered in a hospital or clinic by a specially trained health care professional. Talk to your pediatrician regarding the use of this medicine in children. Special care may be needed. Overdosage: If you think you have taken too much of this medicine contact a poison control center or emergency room at once. NOTE: This medicine is only for you. Do not share this medicine with others. What if I miss a dose? It is important not to miss a dose. Call your doctor or health care professional if you are unable to keep an appointment. What may interact with this medicine? -medicines for seizures -medicines to increase blood counts like filgrastim, pegfilgrastim, sargramostim -some antibiotics like amikacin, gentamicin, neomycin, streptomycin, tobramycin -vaccines Talk to your doctor or health care professional before taking any of these medicines: -acetaminophen -aspirin -ibuprofen -ketoprofen -naproxen This list may not describe all possible interactions. Give your health care provider a list of all the medicines, herbs,  non-prescription drugs, or dietary supplements you use. Also tell them if you smoke, drink alcohol, or use illegal drugs. Some items may interact with your medicine. What should I watch for while using this medicine? Your condition will be monitored carefully while you are receiving this medicine. You will need important blood work done while you are taking this medicine. This drug may make you feel generally unwell. This is not uncommon, as chemotherapy can affect healthy cells as well as cancer cells. Report any side effects. Continue your course of treatment even though you feel ill unless your doctor tells you to stop. In some cases, you may be given additional medicines to help with side effects. Follow all directions for their use. Call your doctor or health care professional for advice if you get a fever, chills or sore throat, or other symptoms of a cold or flu. Do not treat yourself. This drug decreases your body's ability to fight infections. Try to avoid being around people who are sick. This medicine may increase your risk to bruise or bleed. Call your doctor or health care professional if you notice any unusual bleeding. Be careful brushing and flossing your teeth or using a toothpick because you may get an infection or bleed more easily. If you have any dental work done, tell your dentist you are receiving this medicine. Avoid taking products that contain aspirin, acetaminophen, ibuprofen, naproxen, or ketoprofen unless instructed by your doctor. These medicines may hide a fever. Do not become pregnant while taking this medicine. Women  should inform their doctor if they wish to become pregnant or think they might be pregnant. There is a potential for serious side effects to an unborn child. Talk to your health care professional or pharmacist for more information. Do not breast-feed an infant while taking this medicine. What side effects may I notice from receiving this medicine? Side effects  that you should report to your doctor or health care professional as soon as possible: -allergic reactions like skin rash, itching or hives, swelling of the face, lips, or tongue -signs of infection - fever or chills, cough, sore throat, pain or difficulty passing urine -signs of decreased platelets or bleeding - bruising, pinpoint red spots on the skin, black, tarry stools, nosebleeds -signs of decreased red blood cells - unusually weak or tired, fainting spells, lightheadedness -breathing problems -changes in hearing -changes in vision -chest pain -high blood pressure -low blood counts - This drug may decrease the number of white blood cells, red blood cells and platelets. You may be at increased risk for infections and bleeding. -nausea and vomiting -pain, swelling, redness or irritation at the injection site -pain, tingling, numbness in the hands or feet -problems with balance, talking, walking -trouble passing urine or change in the amount of urine Side effects that usually do not require medical attention (report to your doctor or health care professional if they continue or are bothersome): -hair loss -loss of appetite -metallic taste in the mouth or changes in taste This list may not describe all possible side effects. Call your doctor for medical advice about side effects. You may report side effects to FDA at 1-800-FDA-1088. Where should I keep my medicine? This drug is given in a hospital or clinic and will not be stored at home. NOTE: This sheet is a summary. It may not cover all possible information. If you have questions about this medicine, talk to your doctor, pharmacist, or health care provider.    2016, [employer] Standard. (2007-07-23 14:38:05)

## 2016-01-25 ENCOUNTER — Inpatient Hospital Stay
Admission: RE | Admit: 2016-01-25 | Discharge: 2016-01-25 | Disposition: A | Discharge status: Home / Self Care | Payer: Self-pay | Source: Ambulatory Visit | Attending: Radiation Oncology | Admitting: Radiation Oncology

## 2016-01-25 ENCOUNTER — Ambulatory Visit
Admission: RE | Admit: 2016-01-25 | Discharge: 2016-01-25 | Disposition: A | Discharge status: Home / Self Care | Payer: Medicare Other | Source: Ambulatory Visit | Attending: Radiation Oncology | Admitting: Radiation Oncology

## 2016-01-25 ENCOUNTER — Encounter: Payer: Self-pay | Admitting: Radiation Oncology

## 2016-01-25 VITALS — BP 138/78 | HR 71 | Temp 98.1°F | Ht 73.0 in | Wt 177.6 lb

## 2016-01-25 DIAGNOSIS — C3491 Malignant neoplasm of unspecified part of right bronchus or lung: Secondary | ICD-10-CM

## 2016-01-25 DIAGNOSIS — Z510 Encounter for antineoplastic radiation therapy: Secondary | ICD-10-CM | POA: Diagnosis not present

## 2016-01-25 MED ORDER — SONAFINE EX EMUL
1.0000 | Freq: Once | CUTANEOUS | Status: AC
Start: 2016-01-25 — End: 2016-01-25
  Administered 2016-01-25: 1 via TOPICAL

## 2016-01-25 NOTE — Progress Notes (Signed)
Mark Howell has completed 1 fraction to his right lung.  He denies having pain, shortness of breath or a cough.  He had his first chemotherapy cycle yesterday.  BP 138/78 (BP Location: Right Arm, Patient Position: Sitting)   Pulse 71   Temp 98.1 F (36.7 C) (Oral)   Ht '6\' 1"'$  (1.854 m)   Wt 177 lb 9.6 oz (80.6 kg)   SpO2 99%   BMI 23.43 kg/m    Wt Readings from Last 3 Encounters:  01/25/16 177 lb 9.6 oz (80.6 kg)  01/13/16 175 lb 6.4 oz (79.6 kg)  12/31/15 170 lb (77.1 kg)

## 2016-01-25 NOTE — Progress Notes (Signed)
  Radiation Oncology         [phone] ________________________________  Name: Mark Howell MRN: [id number]  Date: 01/25/2016  DOB: 1940-06-11  Weekly Radiation Therapy Management    ICD-9-CM ICD-10-CM   1. 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 . . . . . . . The patient presents for routine under treatment assessment.                                   The patient is without complaint.  chemotherapy yesterday                                 Set-up films were reviewed.                                 The chart was checked. Physical Findings. . .  height is '6\' 1"'$  (1.854 m) and weight is 177 lb 9.6 oz (80.6 kg). His oral temperature is 98.1 F (36.7 C). His blood pressure is 138/78 and his pulse is 71. His oxygen saturation is 99%. . Weight essentially stable.  No significant changes. The lungs are clear. The heart has a regular rhythm and rate Impression . . . . . . . The patient is tolerating radiation. Plan . . . . . . . . . . . . Continue treatment as planned.  ________________________________   Blair Promise, PhD, MD

## 2016-01-25 NOTE — Progress Notes (Signed)
  Radiation Oncology         [phone] ________________________________  Name: JAKIAH GOREE MRN: [id number]  Date: 01/25/2016  DOB: August 24, 1940  Simulation Verification Note    ICD-9-CM ICD-10-CM   1. 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 The clinical setup was verified. Then port films were obtained and uploaded to the radiation oncology medical record software.  The treatment beams were carefully compared against the planned radiation fields. The position location and shape of the radiation fields was reviewed. They targeted volume of tissue appears to be appropriately covered by the radiation beams. Organs at risk appear to be excluded as planned.  Based on my personal review, I approved the simulation verification. The patient's treatment will proceed as planned.  -----------------------------------  Blair Promise, PhD, MD

## 2016-01-25 NOTE — Progress Notes (Signed)
Pt here for patient teaching.  Pt given Radiation and You booklet and Sonafine. Pt reports they have refused to watch the Radiation Therapy Education video.  Reviewed areas of pertinence such as fatigue, skin changes and throat changes . Pt able to give teach back of to pat skin and use unscented/gentle soap,apply Sonafine bid and avoid applying anything to skin within 4 hours of treatment. Pt demonstrated understanding and verbalizes understanding of information given and will contact nursing with any questions or concerns.

## 2016-01-26 ENCOUNTER — Ambulatory Visit
Admission: RE | Admit: 2016-01-26 | Discharge: 2016-01-26 | Disposition: A | Discharge status: Home / Self Care | Payer: Medicare Other | Source: Ambulatory Visit | Attending: Radiation Oncology | Admitting: Radiation Oncology

## 2016-01-26 DIAGNOSIS — Z510 Encounter for antineoplastic radiation therapy: Secondary | ICD-10-CM | POA: Diagnosis not present

## 2016-01-27 ENCOUNTER — Ambulatory Visit
Admission: RE | Admit: 2016-01-27 | Discharge: 2016-01-27 | Disposition: A | Discharge status: Home / Self Care | Payer: Medicare Other | Source: Ambulatory Visit | Attending: Radiation Oncology | Admitting: Radiation Oncology

## 2016-01-27 DIAGNOSIS — Z510 Encounter for antineoplastic radiation therapy: Secondary | ICD-10-CM | POA: Diagnosis not present

## 2016-01-28 ENCOUNTER — Ambulatory Visit
Admission: RE | Admit: 2016-01-28 | Discharge: 2016-01-28 | Disposition: A | Discharge status: Home / Self Care | Payer: Medicare Other | Source: Ambulatory Visit | Attending: Radiation Oncology | Admitting: Radiation Oncology

## 2016-01-28 ENCOUNTER — Telehealth: Payer: Self-pay | Admitting: Medical Oncology

## 2016-01-28 DIAGNOSIS — Z510 Encounter for antineoplastic radiation therapy: Secondary | ICD-10-CM | POA: Diagnosis not present

## 2016-01-28 NOTE — Telephone Encounter (Signed)
I left message for pt to call back for chemo follow up call .

## 2016-01-31 ENCOUNTER — Ambulatory Visit (HOSPITAL_BASED_OUTPATIENT_CLINIC_OR_DEPARTMENT_OTHER): Payer: Medicare Other

## 2016-01-31 ENCOUNTER — Other Ambulatory Visit (HOSPITAL_BASED_OUTPATIENT_CLINIC_OR_DEPARTMENT_OTHER): Payer: Medicare Other

## 2016-01-31 ENCOUNTER — Ambulatory Visit
Admission: RE | Admit: 2016-01-31 | Discharge: 2016-01-31 | Disposition: A | Discharge status: Home / Self Care | Payer: Medicare Other | Source: Ambulatory Visit | Attending: Radiation Oncology | Admitting: Radiation Oncology

## 2016-01-31 ENCOUNTER — Encounter: Payer: Self-pay | Admitting: Internal Medicine

## 2016-01-31 ENCOUNTER — Ambulatory Visit (HOSPITAL_BASED_OUTPATIENT_CLINIC_OR_DEPARTMENT_OTHER): Payer: Medicare Other | Admitting: Internal Medicine

## 2016-01-31 VITALS — BP 145/74 | HR 73 | Temp 97.8°F | Resp 18 | Ht 73.0 in | Wt 176.2 lb

## 2016-01-31 DIAGNOSIS — C3401 Malignant neoplasm of right main bronchus: Secondary | ICD-10-CM

## 2016-01-31 DIAGNOSIS — Z5111 Encounter for antineoplastic chemotherapy: Secondary | ICD-10-CM

## 2016-01-31 DIAGNOSIS — C3491 Malignant neoplasm of unspecified part of right bronchus or lung: Secondary | ICD-10-CM

## 2016-01-31 DIAGNOSIS — Z510 Encounter for antineoplastic radiation therapy: Secondary | ICD-10-CM | POA: Diagnosis not present

## 2016-01-31 LAB — CBC WITH DIFFERENTIAL/PLATELET
BASO%: 0.1 % (ref 0.0–2.0)
Basophils Absolute: 0 10*3/uL (ref 0.0–0.1)
EOS%: 1.7 % (ref 0.0–7.0)
Eosinophils Absolute: 0.1 10*3/uL (ref 0.0–0.5)
HCT: 42.6 % (ref 38.4–49.9)
HGB: 14.8 g/dL (ref 13.0–17.1)
LYMPH%: 10.7 % — AB (ref 14.0–49.0)
MCH: 30.8 pg (ref 27.2–33.4)
MCHC: 34.7 g/dL (ref 32.0–36.0)
MCV: 88.8 fL (ref 79.3–98.0)
MONO#: 0.4 10*3/uL (ref 0.1–0.9)
MONO%: 5.6 % (ref 0.0–14.0)
NEUT%: 81.9 % — AB (ref 39.0–75.0)
NEUTROS ABS: 6.2 10*3/uL (ref 1.5–6.5)
PLATELETS: 152 10*3/uL (ref 140–400)
RBC: 4.8 10*6/uL (ref 4.20–5.82)
RDW: 13 % (ref 11.0–14.6)
WBC: 7.6 10*3/uL (ref 4.0–10.3)
lymph#: 0.8 10*3/uL — ABNORMAL LOW (ref 0.9–3.3)

## 2016-01-31 LAB — COMPREHENSIVE METABOLIC PANEL
ALBUMIN: 3.8 g/dL (ref 3.5–5.0)
ALK PHOS: 115 U/L (ref 40–150)
ALT: 19 U/L (ref 0–55)
ANION GAP: 12 meq/L — AB (ref 3–11)
AST: 24 U/L (ref 5–34)
BUN: 14.7 mg/dL (ref 7.0–26.0)
CALCIUM: 9.7 mg/dL (ref 8.4–10.4)
CHLORIDE: 105 meq/L (ref 98–109)
CO2: 22 mEq/L (ref 22–29)
Creatinine: 1.2 mg/dL (ref 0.7–1.3)
EGFR: 58 mL/min/{1.73_m2} — AB (ref >90)
Glucose: 166 mg/dl — ABNORMAL HIGH (ref 70–140)
POTASSIUM: 3.9 meq/L (ref 3.5–5.1)
Sodium: 139 mEq/L (ref 136–145)
Total Bilirubin: 0.46 mg/dL (ref 0.20–1.20)
Total Protein: 8 g/dL (ref 6.4–8.3)

## 2016-01-31 MED ORDER — FAMOTIDINE IN NACL 20-0.9 MG/50ML-% IV SOLN
20.0000 mg | Freq: Once | INTRAVENOUS | Status: AC
  Administered 2016-01-31: 20 mg via INTRAVENOUS

## 2016-01-31 MED ORDER — SODIUM CHLORIDE 0.9 % IV SOLN
20.0000 mg | Freq: Once | INTRAVENOUS | Status: AC
  Administered 2016-01-31: 20 mg via INTRAVENOUS
  Filled 2016-01-31: qty 2

## 2016-01-31 MED ORDER — PALONOSETRON HCL INJECTION 0.25 MG/5ML
INTRAVENOUS | Status: AC
  Filled 2016-01-31: qty 5

## 2016-01-31 MED ORDER — SODIUM CHLORIDE 0.9 % IV SOLN
Freq: Once | INTRAVENOUS | Status: AC
  Administered 2016-01-31: 14:00:00 via INTRAVENOUS

## 2016-01-31 MED ORDER — PALONOSETRON HCL INJECTION 0.25 MG/5ML
0.2500 mg | Freq: Once | INTRAVENOUS | Status: AC
  Administered 2016-01-31: 0.25 mg via INTRAVENOUS

## 2016-01-31 MED ORDER — DIPHENHYDRAMINE HCL 50 MG/ML IJ SOLN
INTRAMUSCULAR | Status: AC
  Filled 2016-01-31: qty 1

## 2016-01-31 MED ORDER — FAMOTIDINE IN NACL 20-0.9 MG/50ML-% IV SOLN
INTRAVENOUS | Status: AC
  Filled 2016-01-31: qty 50

## 2016-01-31 MED ORDER — SODIUM CHLORIDE 0.9 % IV SOLN
180.6000 mg | Freq: Once | INTRAVENOUS | Status: AC
  Administered 2016-01-31: 180 mg via INTRAVENOUS
  Filled 2016-01-31: qty 18

## 2016-01-31 MED ORDER — DIPHENHYDRAMINE HCL 50 MG/ML IJ SOLN
50.0000 mg | Freq: Once | INTRAMUSCULAR | Status: AC
  Administered 2016-01-31: 50 mg via INTRAVENOUS

## 2016-01-31 MED ORDER — PACLITAXEL CHEMO INJECTION 300 MG/50ML
45.0000 mg/m2 | Freq: Once | INTRAVENOUS | Status: AC
  Administered 2016-01-31: 90 mg via INTRAVENOUS
  Filled 2016-01-31: qty 15

## 2016-01-31 NOTE — Patient Instructions (Signed)
[facility] Discharge Instructions for Patients Receiving Chemotherapy  Today you received the following chemotherapy agents Taxol and Carboplatin.  To help prevent nausea and vomiting after your treatment, we encourage you to take your nausea medication as prescribed.   If you develop nausea and vomiting that is not controlled by your nausea medication, call the clinic.   BELOW ARE SYMPTOMS THAT SHOULD BE REPORTED IMMEDIATELY:  *FEVER GREATER THAN 100.5 F  *CHILLS WITH OR WITHOUT FEVER  NAUSEA AND VOMITING THAT IS NOT CONTROLLED WITH YOUR NAUSEA MEDICATION  *UNUSUAL SHORTNESS OF BREATH  *UNUSUAL BRUISING OR BLEEDING  TENDERNESS IN MOUTH AND THROAT WITH OR WITHOUT PRESENCE OF ULCERS  *URINARY PROBLEMS  *BOWEL PROBLEMS  UNUSUAL RASH Items with * indicate a potential emergency and should be followed up as soon as possible.  Feel free to call the clinic you have any questions or concerns. The clinic phone number is [phone].  Please show the CHEMO ALERT CARD at check-in to the Emergency Department and triage nurse.   

## 2016-01-31 NOTE — Progress Notes (Signed)
[facility] Telephone:(336) [phone]   Fax:(336) [phone]   Multidisciplinary thoracic oncology clinic  OFFICE PROGRESS NOTE  Sherrie Mustache, MD [place] [address]  DIAGNOSIS: Stage IIA (T1a, N1, M0) non-small cell lung cancer, adenocarcinoma presented with right hilar mass with hilar lymphadenopathy diagnosed in September 2017.  PRIOR THERAPY: None  CURRENT THERAPY: Concurrent chemoradiation with weekly carboplatin for AUC of 2 and paclitaxel 45 MG/M2, first cycle 01/24/2016. Status post one cycle.  INTERVAL HISTORY: Mark Howell 75 y.o. male returns to the clinic today for follow-up visit accompanied by his wife. The patient tolerated the first cycle of his treatment fairly well with no significant adverse effects. He denied having any significant fever or chills. He has no nausea or vomiting. He denied having any chest pain, shortness of breath, cough or hemoptysis. He has no weight loss or night sweats. He is here today to start cycle #2 of his concurrent chemoradiation.  MEDICAL HISTORY: Past Medical History:  Diagnosis Date  . Arthritis   . Complication of anesthesia    problems voiding post op  . Full dentures   . GERD (gastroesophageal reflux disease)   . HOH (hard of hearing)   . HTN (hypertension)   . Hyperlipidemia   . Lung mass 12/30/2015  . Psoriatic arthritis ([place])   . Snores   . Wears glasses     ALLERGIES:  is allergic to hydrocodone and oxycodone.  MEDICATIONS:  Current Outpatient Prescriptions  Medication Sig Dispense Refill  . acetaminophen (TYLENOL) 650 MG CR tablet Take 650-1,300 mg by mouth 2 (two) times daily as needed for pain.    Marland Kitchen aspirin EC 81 MG tablet Take 81 mg by mouth at bedtime.     . diphenhydramine-acetaminophen (TYLENOL PM) 25-500 MG TABS Take 1 tablet by mouth at bedtime. For sleep    . ENBREL SURECLICK 50 MG/ML injection Inject 50 mg into the skin once a week. Tuesday    . Multiple  Vitamin (MULITIVITAMIN WITH MINERALS) TABS Take 1 tablet by mouth daily.    Marland Kitchen OVER THE COUNTER MEDICATION Take 1 tablet by mouth 2 (two) times daily. Relief- healthy joint function    . pantoprazole (PROTONIX) 40 MG tablet Take 40 mg by mouth every other day.     . pravastatin (PRAVACHOL) 40 MG tablet Take 40 mg by mouth daily.     . predniSONE (DELTASONE) 10 MG tablet Take 5 mg by mouth every other day.     . prochlorperazine (COMPAZINE) 10 MG tablet Take 1 tablet (10 mg total) by mouth every 6 (six) hours as needed for nausea or vomiting. (Patient not taking: Reported on 01/25/2016) 30 tablet 0  . tamsulosin (FLOMAX) 0.4 MG CAPS capsule TAKE (1) CAPSULE DAILY, START 4 DAYS BEFORE PROCEDURE (Patient taking differently: take 1 capsule by mouth every morning) 30 capsule 2  . Wound Dressings (SONAFINE) Apply 1 application topically 2 (two) times daily.     No current facility-administered medications for this visit.     SURGICAL HISTORY:  Past Surgical History:  Procedure Laterality Date  . COLONOSCOPY    . HEMORRHOID SURGERY N/A 10/01/2013   Procedure: EXAM UNDER ANESTHESIA  AND EXCISIONAL HEMORRHOIDECTOMY WITH HEMORRHOID BANDING X 2;  Surgeon: Gayland Curry, MD;  Location: [facility];  Service: General;  Laterality: N/A;  . INGUINAL HERNIA REPAIR  01/04/2012   Procedure: LAPAROSCOPIC INGUINAL HERNIA;  Surgeon: Gayland Curry, MD,FACS;  Location: [facility];  Service: General;  Laterality: Right;  Marland Kitchen VIDEO BRONCHOSCOPY WITH ENDOBRONCHIAL ULTRASOUND N/A 12/31/2015   Procedure: VIDEO BRONCHOSCOPY WITH ENDOBRONCHIAL ULTRASOUND transbronchial biopsy of node 10 R lymph node;  Surgeon: Grace Isaac, MD;  Location: [facility];  Service: Thoracic;  Laterality: N/A;    REVIEW OF SYSTEMS:  A comprehensive review of systems was negative.   PHYSICAL EXAMINATION: General appearance: alert, cooperative, fatigued and no distress Head: Normocephalic, without obvious abnormality, atraumatic Neck: no  adenopathy, no JVD, supple, symmetrical, trachea midline and thyroid not enlarged, symmetric, no tenderness/mass/nodules Lymph nodes: Cervical, supraclavicular, and axillary nodes normal. Resp: clear to auscultation bilaterally Back: symmetric, no curvature. ROM normal. No CVA tenderness. Cardio: regular rate and rhythm, S1, S2 normal, no murmur, click, rub or gallop GI: soft, non-tender; bowel sounds normal; no masses,  no organomegaly Extremities: extremities normal, atraumatic, no cyanosis or edema Neurologic: Alert and oriented X 3, normal strength and tone. Normal symmetric reflexes. Normal coordination and gait  ECOG PERFORMANCE STATUS: 1 - Symptomatic but completely ambulatory  Blood pressure (!) 145/74, pulse 73, temperature 97.8 F (36.6 C), temperature source Oral, resp. rate 18, height '6\' 1"'$  (1.854 m), weight 176 lb 3.2 oz (79.9 kg), SpO2 100 %.  LABORATORY DATA: Lab Results  Component Value Date   WBC 7.6 01/31/2016   HGB 14.8 01/31/2016   HCT 42.6 01/31/2016   MCV 88.8 01/31/2016   PLT 152 01/31/2016      Chemistry      Component Value Date/Time   NA 139 01/24/2016 1151   K 4.0 01/24/2016 1151   CL 103 12/31/2015 0732   CO2 21 (L) 01/24/2016 1151   BUN 13.8 01/24/2016 1151   CREATININE 1.1 01/24/2016 1151      Component Value Date/Time   CALCIUM 9.5 01/24/2016 1151   ALKPHOS 110 01/24/2016 1151   AST 22 01/24/2016 1151   ALT 17 01/24/2016 1151   BILITOT 0.38 01/24/2016 1151       RADIOGRAPHIC STUDIES: Mr Jeri Cos XF Contrast  Result Date: 01/01/2016 CLINICAL DATA:  75 year old male recently diagnosed with malignant right hilar mass on PET-CT. Staging. Subsequent encounter. EXAM: MRI HEAD WITHOUT AND WITH CONTRAST TECHNIQUE: Multiplanar, multiecho pulse sequences of the brain and surrounding structures were obtained without and with intravenous contrast. CONTRAST:  60m MULTIHANCE GADOBENATE DIMEGLUMINE 529 MG/ML IV SOLN COMPARISON:  PET-CT 12/27/2015.  FINDINGS: No abnormal enhancement identified. No midline shift, mass effect, or evidence of intracranial mass lesion. No dural thickening. Bone marrow signal in the calvarium is heterogeneous, but no destructive osseous lesion is identified. Negative for age visible cervical spine and spinal cord. Cerebral volume is within normal limits for age. No restricted diffusion to suggest acute infarction. No ventriculomegaly, extra-axial collection or acute intracranial hemorrhage. Cervicomedullary junction and pituitary are within normal limits. Major intracranial vascular flow voids are preserved; there is evidence of flow limiting distal right vertebral artery atherosclerosis. GPearline Cablesand white matter signal is within normal limits for age throughout the brain. Visible internal auditory structures appear normal. Mild to moderate right ethmoid sinus mucosal thickening. Trace right mastoid fluid. Other Visualized paranasal sinuses and mastoids are well pneumatized. Negative orbit and scalp soft tissues. IMPRESSION: 1. No metastatic disease or acute intracranial abnormality. Largely unremarkable for age MRI appearance of the brain. 2. 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 3. Right ethmoid sinus inflammation. Electronically Signed   By: HGenevie AnnM.D.   On: 01/01/2016 12:21    ASSESSMENT AND PLAN: This  is a very pleasant 75 years old white male recently diagnosed with a stage IIA (T1a, N1, M0) non-small cell lung cancer favoring adenocarcinoma presented with right hilar mass and right hilar adenopathy diagnosed in September 2017. The patient is not currently a good surgical candidate for resection because of the central location of his tumor and he may require right pneumonectomy if he proceeded with surgery at this point. His case was discussed at the weekly thoracic conference and Dr. Servando Snare would like the patient to have a course of neoadjuvant concurrent chemoradiation before  reevaluation for surgical resection. The patient is currently undergoing concurrent chemoradiation with weekly carboplatin and paclitaxel status post 1 cycle and tolerated the first cycle of his treatment fairly well. I recommended for the patient to proceed with cycle #2 today as a scheduled. He would come back for follow-up visit in 2 weeks for evaluation before starting cycle #4. The patient voices understanding of current disease status and treatment options and is in agreement with the current care plan.  All questions were answered. The patient knows to call the clinic with any problems, questions or concerns. We can certainly see the patient much sooner if necessary.  Disclaimer: This note was dictated with voice recognition software. Similar sounding words can inadvertently be transcribed and may not be corrected upon review.

## 2016-02-01 ENCOUNTER — Encounter: Payer: Self-pay | Admitting: Radiation Oncology

## 2016-02-01 ENCOUNTER — Ambulatory Visit
Admission: RE | Admit: 2016-02-01 | Discharge: 2016-02-01 | Disposition: A | Discharge status: Home / Self Care | Payer: Medicare Other | Source: Ambulatory Visit | Attending: Radiation Oncology | Admitting: Radiation Oncology

## 2016-02-01 VITALS — BP 135/93 | HR 78 | Temp 97.7°F | Ht 73.0 in | Wt 177.8 lb

## 2016-02-01 DIAGNOSIS — Z510 Encounter for antineoplastic radiation therapy: Secondary | ICD-10-CM | POA: Diagnosis not present

## 2016-02-01 DIAGNOSIS — C3491 Malignant neoplasm of unspecified part of right bronchus or lung: Secondary | ICD-10-CM

## 2016-02-01 NOTE — Progress Notes (Signed)
Mark Howell has completed 6 fractions to his right lung.  He denies having pain, sore throat or trouble swallowing.  He denies having a cough or shortness of breath.  He reports occasional fatigue.  He had chemotherapy on Monday.  He is using sonafine twice a day.  BP (!) 135/93 (BP Location: Right Arm, Patient Position: Sitting)   Pulse 78   Temp 97.7 F (36.5 C) (Oral)   Ht '6\' 1"'$  (1.854 m)   Wt 177 lb 12.8 oz (80.6 kg)   SpO2 100%   BMI 23.46 kg/m   Wt Readings from Last 3 Encounters:  02/01/16 177 lb 12.8 oz (80.6 kg)  01/31/16 176 lb 3.2 oz (79.9 kg)  01/25/16 177 lb 9.6 oz (80.6 kg)

## 2016-02-01 NOTE — Progress Notes (Signed)
  Radiation Oncology         [phone] ________________________________  Name: Mark Howell MRN: [id number]  Date: 02/01/2016  DOB: 1940-12-03  Weekly Radiation Therapy Management  Clinical stage IIA(T1a, N1, M0) adenocarcinoma of the right lung    Current Dose: 10.8 Gy     Planned Dose:  45 Gy  Narrative . . . . . . . . The patient presents for routine under treatment assessment.                                   The patient is without complaint. [name] has completed 6 fractions to his right lung.  He denies having pain, sore throat or trouble swallowing.  He denies having a cough or shortness of breath.  He reports occasional fatigue.  He had chemotherapy on Monday.  He is using sonafine twice a day.                                  Set-up films were reviewed.                                 The chart was checked. Physical Findings. . .  height is '6\' 1"'$  (1.854 m) and weight is 177 lb 12.8 oz (80.6 kg). His oral temperature is 97.7 F (36.5 C). His blood pressure is 135/93 (abnormal) and his pulse is 78. His oxygen saturation is 100%. . Weight essentially stable.  No significant changes. The lungs are clear. The heart has a regular rhythm and rate Impression . . . . . . . The patient is tolerating radiation. Plan . . . . . . . . . . . . Continue treatment as planned.  ________________________________   Blair Promise, PhD, MD

## 2016-02-02 ENCOUNTER — Ambulatory Visit
Admission: RE | Admit: 2016-02-02 | Discharge: 2016-02-02 | Disposition: A | Discharge status: Home / Self Care | Payer: Medicare Other | Source: Ambulatory Visit | Attending: Radiation Oncology | Admitting: Radiation Oncology

## 2016-02-02 DIAGNOSIS — Z510 Encounter for antineoplastic radiation therapy: Secondary | ICD-10-CM | POA: Diagnosis not present

## 2016-02-03 ENCOUNTER — Ambulatory Visit
Admission: RE | Admit: 2016-02-03 | Discharge: 2016-02-03 | Disposition: A | Discharge status: Home / Self Care | Payer: Medicare Other | Source: Ambulatory Visit | Attending: Radiation Oncology | Admitting: Radiation Oncology

## 2016-02-03 DIAGNOSIS — Z510 Encounter for antineoplastic radiation therapy: Secondary | ICD-10-CM | POA: Diagnosis not present

## 2016-02-04 ENCOUNTER — Ambulatory Visit
Admission: RE | Admit: 2016-02-04 | Discharge: 2016-02-04 | Disposition: A | Discharge status: Home / Self Care | Payer: Medicare Other | Source: Ambulatory Visit | Attending: Radiation Oncology | Admitting: Radiation Oncology

## 2016-02-04 ENCOUNTER — Telehealth: Payer: Self-pay | Admitting: Internal Medicine

## 2016-02-04 ENCOUNTER — Telehealth: Payer: Self-pay | Admitting: *Deleted

## 2016-02-04 DIAGNOSIS — Z510 Encounter for antineoplastic radiation therapy: Secondary | ICD-10-CM | POA: Diagnosis not present

## 2016-02-04 NOTE — Telephone Encounter (Signed)
S/w pt's wife, gave appts for 10/9 @ 10.15 and asked that they pick up a calendar then.

## 2016-02-04 NOTE — Telephone Encounter (Signed)
Per staff message I have scheduled appt and notified the scheduler

## 2016-02-07 ENCOUNTER — Other Ambulatory Visit (HOSPITAL_BASED_OUTPATIENT_CLINIC_OR_DEPARTMENT_OTHER): Payer: Medicare Other

## 2016-02-07 ENCOUNTER — Ambulatory Visit
Admission: RE | Admit: 2016-02-07 | Discharge: 2016-02-07 | Disposition: A | Discharge status: Home / Self Care | Payer: Medicare Other | Source: Ambulatory Visit | Attending: Radiation Oncology | Admitting: Radiation Oncology

## 2016-02-07 ENCOUNTER — Ambulatory Visit (HOSPITAL_BASED_OUTPATIENT_CLINIC_OR_DEPARTMENT_OTHER): Payer: Medicare Other

## 2016-02-07 VITALS — BP 126/80 | HR 78 | Temp 98.0°F

## 2016-02-07 DIAGNOSIS — Z5111 Encounter for antineoplastic chemotherapy: Secondary | ICD-10-CM | POA: Diagnosis not present

## 2016-02-07 DIAGNOSIS — C3401 Malignant neoplasm of right main bronchus: Secondary | ICD-10-CM

## 2016-02-07 DIAGNOSIS — Z510 Encounter for antineoplastic radiation therapy: Secondary | ICD-10-CM | POA: Diagnosis not present

## 2016-02-07 DIAGNOSIS — C3491 Malignant neoplasm of unspecified part of right bronchus or lung: Secondary | ICD-10-CM

## 2016-02-07 LAB — COMPREHENSIVE METABOLIC PANEL
ALBUMIN: 3.8 g/dL (ref 3.5–5.0)
ALK PHOS: 93 U/L (ref 40–150)
ALT: 24 U/L (ref 0–55)
ANION GAP: 11 meq/L (ref 3–11)
AST: 25 U/L (ref 5–34)
BUN: 20.7 mg/dL (ref 7.0–26.0)
CALCIUM: 9.5 mg/dL (ref 8.4–10.4)
CO2: 20 mEq/L — ABNORMAL LOW (ref 22–29)
Chloride: 105 mEq/L (ref 98–109)
Creatinine: 1.3 mg/dL (ref 0.7–1.3)
EGFR: 53 mL/min/{1.73_m2} — AB (ref >90)
Glucose: 161 mg/dl — ABNORMAL HIGH (ref 70–140)
POTASSIUM: 4.7 meq/L (ref 3.5–5.1)
Sodium: 136 mEq/L (ref 136–145)
Total Bilirubin: 0.6 mg/dL (ref 0.20–1.20)
Total Protein: 7.7 g/dL (ref 6.4–8.3)

## 2016-02-07 LAB — CBC WITH DIFFERENTIAL/PLATELET
BASO%: 0.5 % (ref 0.0–2.0)
BASOS ABS: 0 10*3/uL (ref 0.0–0.1)
EOS ABS: 0.1 10*3/uL (ref 0.0–0.5)
EOS%: 1.3 % (ref 0.0–7.0)
HCT: 45.7 % (ref 38.4–49.9)
HEMOGLOBIN: 15.3 g/dL (ref 13.0–17.1)
LYMPH%: 9.6 % — ABNORMAL LOW (ref 14.0–49.0)
MCH: 30.3 pg (ref 27.2–33.4)
MCHC: 33.5 g/dL (ref 32.0–36.0)
MCV: 90.4 fL (ref 79.3–98.0)
MONO#: 0.5 10*3/uL (ref 0.1–0.9)
MONO%: 6 % (ref 0.0–14.0)
NEUT#: 6.4 10*3/uL (ref 1.5–6.5)
NEUT%: 82.6 % — ABNORMAL HIGH (ref 39.0–75.0)
Platelets: 134 10*3/uL — ABNORMAL LOW (ref 140–400)
RBC: 5.05 10*6/uL (ref 4.20–5.82)
RDW: 12.8 % (ref 11.0–14.6)
WBC: 7.8 10*3/uL (ref 4.0–10.3)
lymph#: 0.7 10*3/uL — ABNORMAL LOW (ref 0.9–3.3)

## 2016-02-07 MED ORDER — SODIUM CHLORIDE 0.9 % IV SOLN
20.0000 mg | Freq: Once | INTRAVENOUS | Status: AC
  Administered 2016-02-07: 20 mg via INTRAVENOUS
  Filled 2016-02-07: qty 2

## 2016-02-07 MED ORDER — FAMOTIDINE IN NACL 20-0.9 MG/50ML-% IV SOLN
20.0000 mg | Freq: Once | INTRAVENOUS | Status: AC
  Administered 2016-02-07: 20 mg via INTRAVENOUS

## 2016-02-07 MED ORDER — PALONOSETRON HCL INJECTION 0.25 MG/5ML
0.2500 mg | Freq: Once | INTRAVENOUS | Status: AC
  Administered 2016-02-07: 0.25 mg via INTRAVENOUS

## 2016-02-07 MED ORDER — DIPHENHYDRAMINE HCL 50 MG/ML IJ SOLN
50.0000 mg | Freq: Once | INTRAMUSCULAR | Status: AC
  Administered 2016-02-07: 50 mg via INTRAVENOUS

## 2016-02-07 MED ORDER — PALONOSETRON HCL INJECTION 0.25 MG/5ML
INTRAVENOUS | Status: AC
  Filled 2016-02-07: qty 5

## 2016-02-07 MED ORDER — SODIUM CHLORIDE 0.9 % IV SOLN
45.0000 mg/m2 | Freq: Once | INTRAVENOUS | Status: AC
  Administered 2016-02-07: 90 mg via INTRAVENOUS
  Filled 2016-02-07: qty 15

## 2016-02-07 MED ORDER — SODIUM CHLORIDE 0.9 % IV SOLN
Freq: Once | INTRAVENOUS | Status: AC
  Administered 2016-02-07: 12:00:00 via INTRAVENOUS

## 2016-02-07 MED ORDER — FAMOTIDINE IN NACL 20-0.9 MG/50ML-% IV SOLN
INTRAVENOUS | Status: AC
  Filled 2016-02-07: qty 50

## 2016-02-07 MED ORDER — DIPHENHYDRAMINE HCL 50 MG/ML IJ SOLN
INTRAMUSCULAR | Status: AC
  Filled 2016-02-07: qty 1

## 2016-02-07 MED ORDER — CARBOPLATIN CHEMO INJECTION 450 MG/45ML
180.6000 mg | Freq: Once | INTRAVENOUS | Status: AC
  Administered 2016-02-07: 180 mg via INTRAVENOUS
  Filled 2016-02-07: qty 18

## 2016-02-07 NOTE — Patient Instructions (Signed)
[facility] Discharge Instructions for Patients Receiving Chemotherapy  Today you received the following chemotherapy agents Taxol and Carboplatin  To help prevent nausea and vomiting after your treatment, we encourage you to take your nausea medication as directed. No Zofran for 3 days. Take Compazine instead.   If you develop nausea and vomiting that is not controlled by your nausea medication, call the clinic.   BELOW ARE SYMPTOMS THAT SHOULD BE REPORTED IMMEDIATELY:  *FEVER GREATER THAN 100.5 F  *CHILLS WITH OR WITHOUT FEVER  NAUSEA AND VOMITING THAT IS NOT CONTROLLED WITH YOUR NAUSEA MEDICATION  *UNUSUAL SHORTNESS OF BREATH  *UNUSUAL BRUISING OR BLEEDING  TENDERNESS IN MOUTH AND THROAT WITH OR WITHOUT PRESENCE OF ULCERS  *URINARY PROBLEMS  *BOWEL PROBLEMS  UNUSUAL RASH Items with * indicate a potential emergency and should be followed up as soon as possible.  Feel free to call the clinic you have any questions or concerns. The clinic phone number is [phone].  Please show the CHEMO ALERT CARD at check-in to the Emergency Department and triage nurse.   

## 2016-02-08 ENCOUNTER — Ambulatory Visit
Admission: RE | Admit: 2016-02-08 | Discharge: 2016-02-08 | Disposition: A | Discharge status: Home / Self Care | Payer: Medicare Other | Source: Ambulatory Visit | Attending: Radiation Oncology | Admitting: Radiation Oncology

## 2016-02-08 ENCOUNTER — Encounter: Payer: Self-pay | Admitting: Radiation Oncology

## 2016-02-08 VITALS — BP 125/81 | HR 90 | Temp 97.8°F | Resp 18 | Ht 73.0 in | Wt 177.6 lb

## 2016-02-08 DIAGNOSIS — Z510 Encounter for antineoplastic radiation therapy: Secondary | ICD-10-CM | POA: Diagnosis not present

## 2016-02-08 DIAGNOSIS — C3491 Malignant neoplasm of unspecified part of right bronchus or lung: Secondary | ICD-10-CM

## 2016-02-08 NOTE — Progress Notes (Signed)
Veldon Wager has completed 11 fractions to his right lung.  He denies having pain, sore throat or trouble swallowing.  Reports hoarseness and coughing clear secretion. He denies having  shortness of breath.  He reports occasional fatigue at night.  He had chemotherapy on Monday.  He is using sonafine twice a day. Wt Readings from Last 3 Encounters:  02/08/16 177 lb 9.6 oz (80.6 kg)  02/01/16 177 lb 12.8 oz (80.6 kg)  01/31/16 176 lb 3.2 oz (79.9 kg)  BP 125/81 (BP Location: Right Arm, Patient Position: Sitting, Cuff Size: Normal)   Pulse 90   Temp 97.8 F (36.6 C) (Oral)   Resp 18   Ht '6\' 1"'$  (1.854 m)   Wt 177 lb 9.6 oz (80.6 kg)   BMI 23.43 kg/m

## 2016-02-08 NOTE — Progress Notes (Signed)
  Radiation Oncology         [phone] ________________________________  Name: Mark Howell MRN: [id number]  Date: 02/08/2016  DOB: 1940/07/08  Weekly Radiation Therapy Management  Clinical stage IIA(T1a, N1, M0) adenocarcinoma of the right lung     Current Dose: 19.8 Gy     Planned Dose:  45 Gy  Narrative . . . . . . . . The patient presents for routine under treatment assessment.                                  Mr.  Mark Howell has completed 11 fractions to his right lung. He denies having pain, sore throat or trouble swallowing. Reports hoarseness and coughing clear secretion. He denies having  shortness of breath. He reports occasional fatigue at night. He had chemotherapy on Monday. He is using sonafine twice a day. The patient reports that his voice gets "raspy" to the point where others are unable to understand what he is saying.    Set-up films were reviewed.  The chart was checked. Physical Findings. . .  height is '6\' 1"'$  (1.854 m) and weight is 177 lb 9.6 oz (80.6 kg). His oral temperature is 97.8 F (36.6 C). His blood pressure is 125/81 and his pulse is 90. His respiration is 18. . Weight essentially stable.  No significant changes. The lungs are clear. The heart has a regular rhythm and rate Impression . . . . . . . The patient is tolerating radiation. Plan . . . . . . . . . . . . Continue treatment as planned.  ________________________________   Blair Promise, PhD, MD  This document serves as a record of services personally performed by Gery Pray, MD. It was created on his behalf by Truddie Hidden, a trained medical scribe. The creation of this record is based on the scribe's personal observations and the provider's statements to them. This document has been checked and approved by the attending provider.

## 2016-02-09 ENCOUNTER — Ambulatory Visit
Admission: RE | Admit: 2016-02-09 | Discharge: 2016-02-09 | Disposition: A | Discharge status: Home / Self Care | Payer: Medicare Other | Source: Ambulatory Visit | Attending: Radiation Oncology | Admitting: Radiation Oncology

## 2016-02-09 DIAGNOSIS — Z510 Encounter for antineoplastic radiation therapy: Secondary | ICD-10-CM | POA: Diagnosis not present

## 2016-02-10 ENCOUNTER — Ambulatory Visit
Admission: RE | Admit: 2016-02-10 | Discharge: 2016-02-10 | Disposition: A | Discharge status: Home / Self Care | Payer: Medicare Other | Source: Ambulatory Visit | Attending: Radiation Oncology | Admitting: Radiation Oncology

## 2016-02-10 DIAGNOSIS — Z510 Encounter for antineoplastic radiation therapy: Secondary | ICD-10-CM | POA: Diagnosis not present

## 2016-02-11 ENCOUNTER — Ambulatory Visit
Admission: RE | Admit: 2016-02-11 | Discharge: 2016-02-11 | Disposition: A | Discharge status: Home / Self Care | Payer: Medicare Other | Source: Ambulatory Visit | Attending: Radiation Oncology | Admitting: Radiation Oncology

## 2016-02-11 ENCOUNTER — Encounter: Payer: Self-pay | Admitting: *Deleted

## 2016-02-11 DIAGNOSIS — Z510 Encounter for antineoplastic radiation therapy: Secondary | ICD-10-CM | POA: Diagnosis not present

## 2016-02-14 ENCOUNTER — Other Ambulatory Visit (HOSPITAL_BASED_OUTPATIENT_CLINIC_OR_DEPARTMENT_OTHER): Payer: Medicare Other

## 2016-02-14 ENCOUNTER — Ambulatory Visit (HOSPITAL_BASED_OUTPATIENT_CLINIC_OR_DEPARTMENT_OTHER): Payer: Medicare Other | Admitting: Internal Medicine

## 2016-02-14 ENCOUNTER — Ambulatory Visit
Admission: RE | Admit: 2016-02-14 | Discharge: 2016-02-14 | Disposition: A | Discharge status: Home / Self Care | Payer: Medicare Other | Source: Ambulatory Visit | Attending: Radiation Oncology | Admitting: Radiation Oncology

## 2016-02-14 ENCOUNTER — Ambulatory Visit (HOSPITAL_BASED_OUTPATIENT_CLINIC_OR_DEPARTMENT_OTHER): Payer: Medicare Other

## 2016-02-14 ENCOUNTER — Encounter: Payer: Self-pay | Admitting: Internal Medicine

## 2016-02-14 VITALS — BP 124/78 | HR 90 | Temp 97.9°F | Resp 17 | Ht 73.0 in | Wt 179.3 lb

## 2016-02-14 DIAGNOSIS — Z510 Encounter for antineoplastic radiation therapy: Secondary | ICD-10-CM | POA: Diagnosis not present

## 2016-02-14 DIAGNOSIS — Z5111 Encounter for antineoplastic chemotherapy: Secondary | ICD-10-CM | POA: Diagnosis not present

## 2016-02-14 DIAGNOSIS — R1310 Dysphagia, unspecified: Secondary | ICD-10-CM | POA: Diagnosis not present

## 2016-02-14 DIAGNOSIS — C3401 Malignant neoplasm of right main bronchus: Secondary | ICD-10-CM

## 2016-02-14 DIAGNOSIS — C3491 Malignant neoplasm of unspecified part of right bronchus or lung: Secondary | ICD-10-CM

## 2016-02-14 LAB — COMPREHENSIVE METABOLIC PANEL
ALT: 26 U/L (ref 0–55)
ANION GAP: 12 meq/L — AB (ref 3–11)
AST: 29 U/L (ref 5–34)
Albumin: 3.9 g/dL (ref 3.5–5.0)
Alkaline Phosphatase: 87 U/L (ref 40–150)
BUN: 15.7 mg/dL (ref 7.0–26.0)
CHLORIDE: 105 meq/L (ref 98–109)
CO2: 19 meq/L — AB (ref 22–29)
Calcium: 9.4 mg/dL (ref 8.4–10.4)
Creatinine: 1.2 mg/dL (ref 0.7–1.3)
EGFR: 57 mL/min/{1.73_m2} — AB (ref >90)
Glucose: 173 mg/dl — ABNORMAL HIGH (ref 70–140)
Potassium: 4.5 mEq/L (ref 3.5–5.1)
SODIUM: 136 meq/L (ref 136–145)
Total Bilirubin: 0.38 mg/dL (ref 0.20–1.20)
Total Protein: 7.8 g/dL (ref 6.4–8.3)

## 2016-02-14 LAB — CBC WITH DIFFERENTIAL/PLATELET
BASO%: 0.6 % (ref 0.0–2.0)
Basophils Absolute: 0 10*3/uL (ref 0.0–0.1)
EOS ABS: 0 10*3/uL (ref 0.0–0.5)
EOS%: 0.4 % (ref 0.0–7.0)
HCT: 42.6 % (ref 38.4–49.9)
HGB: 14.7 g/dL (ref 13.0–17.1)
LYMPH%: 11.1 % — AB (ref 14.0–49.0)
MCH: 31.1 pg (ref 27.2–33.4)
MCHC: 34.5 g/dL (ref 32.0–36.0)
MCV: 90.1 fL (ref 79.3–98.0)
MONO#: 0.6 10*3/uL (ref 0.1–0.9)
MONO%: 8.8 % (ref 0.0–14.0)
NEUT#: 5.1 10*3/uL (ref 1.5–6.5)
NEUT%: 79.1 % — AB (ref 39.0–75.0)
PLATELETS: 117 10*3/uL — AB (ref 140–400)
RBC: 4.72 10*6/uL (ref 4.20–5.82)
RDW: 12.9 % (ref 11.0–14.6)
WBC: 6.4 10*3/uL (ref 4.0–10.3)
lymph#: 0.7 10*3/uL — ABNORMAL LOW (ref 0.9–3.3)

## 2016-02-14 MED ORDER — DIPHENHYDRAMINE HCL 50 MG/ML IJ SOLN
50.0000 mg | Freq: Once | INTRAMUSCULAR | Status: AC
  Administered 2016-02-14: 50 mg via INTRAVENOUS

## 2016-02-14 MED ORDER — SODIUM CHLORIDE 0.9 % IV SOLN
45.0000 mg/m2 | Freq: Once | INTRAVENOUS | Status: AC
  Administered 2016-02-14: 90 mg via INTRAVENOUS
  Filled 2016-02-14: qty 15

## 2016-02-14 MED ORDER — PALONOSETRON HCL INJECTION 0.25 MG/5ML
INTRAVENOUS | Status: AC
  Filled 2016-02-14: qty 5

## 2016-02-14 MED ORDER — SODIUM CHLORIDE 0.9 % IV SOLN: Freq: Once | INTRAVENOUS | Status: DC

## 2016-02-14 MED ORDER — CARBOPLATIN CHEMO INJECTION 450 MG/45ML
180.6000 mg | Freq: Once | INTRAVENOUS | Status: AC
  Administered 2016-02-14: 180 mg via INTRAVENOUS
  Filled 2016-02-14: qty 18

## 2016-02-14 MED ORDER — DIPHENHYDRAMINE HCL 50 MG/ML IJ SOLN
INTRAMUSCULAR | Status: AC
  Filled 2016-02-14: qty 1

## 2016-02-14 MED ORDER — FAMOTIDINE IN NACL 20-0.9 MG/50ML-% IV SOLN
20.0000 mg | Freq: Once | INTRAVENOUS | Status: AC
  Administered 2016-02-14: 20 mg via INTRAVENOUS

## 2016-02-14 MED ORDER — PALONOSETRON HCL INJECTION 0.25 MG/5ML
0.2500 mg | Freq: Once | INTRAVENOUS | Status: AC
  Administered 2016-02-14: 0.25 mg via INTRAVENOUS

## 2016-02-14 MED ORDER — SUCRALFATE 1 G PO TABS: 1.0000 g | ORAL_TABLET | Freq: Three times a day (TID) | ORAL | 0 refills | Status: DC

## 2016-02-14 MED ORDER — SODIUM CHLORIDE 0.9 % IV SOLN
20.0000 mg | Freq: Once | INTRAVENOUS | Status: AC
  Administered 2016-02-14: 20 mg via INTRAVENOUS
  Filled 2016-02-14: qty 2

## 2016-02-14 MED ORDER — DEXAMETHASONE SODIUM PHOSPHATE 10 MG/ML IJ SOLN
INTRAMUSCULAR | Status: AC
  Filled 2016-02-14: qty 1

## 2016-02-14 MED ORDER — FAMOTIDINE IN NACL 20-0.9 MG/50ML-% IV SOLN
INTRAVENOUS | Status: AC
  Filled 2016-02-14: qty 50

## 2016-02-14 NOTE — Patient Instructions (Signed)
[facility] Discharge Instructions for Patients Receiving Chemotherapy  Today you received the following chemotherapy agents Taxol and Carboplatin  To help prevent nausea and vomiting after your treatment, we encourage you to take your nausea medication as directed. No Zofran for 3 days. Take Compazine instead.   If you develop nausea and vomiting that is not controlled by your nausea medication, call the clinic.   BELOW ARE SYMPTOMS THAT SHOULD BE REPORTED IMMEDIATELY:  *FEVER GREATER THAN 100.5 F  *CHILLS WITH OR WITHOUT FEVER  NAUSEA AND VOMITING THAT IS NOT CONTROLLED WITH YOUR NAUSEA MEDICATION  *UNUSUAL SHORTNESS OF BREATH  *UNUSUAL BRUISING OR BLEEDING  TENDERNESS IN MOUTH AND THROAT WITH OR WITHOUT PRESENCE OF ULCERS  *URINARY PROBLEMS  *BOWEL PROBLEMS  UNUSUAL RASH Items with * indicate a potential emergency and should be followed up as soon as possible.  Feel free to call the clinic you have any questions or concerns. The clinic phone number is [phone].  Please show the CHEMO ALERT CARD at check-in to the Emergency Department and triage nurse.   

## 2016-02-14 NOTE — Progress Notes (Signed)
[place] [facility] [phone]   Fax:(336) [phone]   Multidisciplinary thoracic oncology clinic  OFFICE PROGRESS NOTE  Sherrie Mustache, MD [place] [address]  DIAGNOSIS: Stage IIA (T1a, N1, M0) non-small cell lung cancer, adenocarcinoma presented with right hilar mass with hilar lymphadenopathy diagnosed in September 2017.  PRIOR THERAPY: None  CURRENT THERAPY: Concurrent chemoradiation with weekly carboplatin for AUC of 2 and paclitaxel 45 MG/M2, first cycle 01/24/2016. Status post 3 cycles.  INTERVAL HISTORY: Mark Howell 75 y.o. male returns to the clinic today for follow-up visit accompanied by his wife. The patient tolerated the first 3 cycles of his treatment fairly well with no significant adverse effects. He denied having any significant fever or chills. He has no nausea or vomiting but has mild odynophagia. He denied having any chest pain, shortness of breath, cough or hemoptysis. He has no weight loss or night sweats. He is here today to start cycle #4 of his concurrent chemoradiation.  MEDICAL HISTORY: Past Medical History:  Diagnosis Date  . Arthritis   . Complication of anesthesia    problems voiding post op  . Full dentures   . GERD (gastroesophageal reflux disease)   . HOH (hard of hearing)   . HTN (hypertension)   . Hyperlipidemia   . Lung mass 12/30/2015  . Psoriatic arthritis ([place])   . Snores   . Wears glasses     ALLERGIES:  is allergic to hydrocodone and oxycodone.  MEDICATIONS:  Current Outpatient Prescriptions  Medication Sig Dispense Refill  . acetaminophen (TYLENOL) 650 MG CR tablet Take 650-1,300 mg by mouth 2 (two) times daily as needed for pain.    Marland Kitchen AFLURIA QUADRIVALENT injection     . aspirin EC 81 MG tablet Take 81 mg by mouth at bedtime.     . diphenhydramine-acetaminophen (TYLENOL PM) 25-500 MG TABS Take 1 tablet by mouth at bedtime. For sleep    . ENBREL SURECLICK 50 MG/ML injection  Inject 50 mg into the skin once a week. Tuesday    . folic acid (FOLVITE) 976 MCG tablet Take by mouth.    . Multiple Vitamin (MULITIVITAMIN WITH MINERALS) TABS Take 1 tablet by mouth daily.    . pantoprazole (PROTONIX) 40 MG tablet Take 40 mg by mouth every other day.     . pravastatin (PRAVACHOL) 40 MG tablet Take 40 mg by mouth daily.     . predniSONE (DELTASONE) 10 MG tablet Take 5 mg by mouth every other day.     . prochlorperazine (COMPAZINE) 10 MG tablet Take 1 tablet (10 mg total) by mouth every 6 (six) hours as needed for nausea or vomiting. 30 tablet 0  . tamsulosin (FLOMAX) 0.4 MG CAPS capsule TAKE (1) CAPSULE DAILY, START 4 DAYS BEFORE PROCEDURE (Patient taking differently: take 1 capsule by mouth every morning) 30 capsule 2  . Wound Dressings (SONAFINE) Apply 1 application topically 2 (two) times daily.     No current facility-administered medications for this visit.     SURGICAL HISTORY:  Past Surgical History:  Procedure Laterality Date  . COLONOSCOPY    . HEMORRHOID SURGERY N/A 10/01/2013   Procedure: EXAM UNDER ANESTHESIA  AND EXCISIONAL HEMORRHOIDECTOMY WITH HEMORRHOID BANDING X 2;  Surgeon: Gayland Curry, MD;  Location: [facility];  Service: General;  Laterality: N/A;  . INGUINAL HERNIA REPAIR  01/04/2012   Procedure: LAPAROSCOPIC INGUINAL HERNIA;  Surgeon: Gayland Curry, MD,FACS;  Location: [facility];  Service: General;  Laterality: Right;  Marland Kitchen VIDEO BRONCHOSCOPY WITH ENDOBRONCHIAL ULTRASOUND N/A 12/31/2015   Procedure: VIDEO BRONCHOSCOPY WITH ENDOBRONCHIAL ULTRASOUND transbronchial biopsy of node 10 R lymph node;  Surgeon: Grace Isaac, MD;  Location: [facility];  Service: Thoracic;  Laterality: N/A;    REVIEW OF SYSTEMS:  A comprehensive review of systems was negative except for: Gastrointestinal: positive for odynophagia   PHYSICAL EXAMINATION: General appearance: alert, cooperative, fatigued and no distress Head: Normocephalic, without obvious abnormality,  atraumatic Neck: no adenopathy, no JVD, supple, symmetrical, trachea midline and thyroid not enlarged, symmetric, no tenderness/mass/nodules Lymph nodes: Cervical, supraclavicular, and axillary nodes normal. Resp: clear to auscultation bilaterally Back: symmetric, no curvature. ROM normal. No CVA tenderness. Cardio: regular rate and rhythm, S1, S2 normal, no murmur, click, rub or gallop GI: soft, non-tender; bowel sounds normal; no masses,  no organomegaly Extremities: extremities normal, atraumatic, no cyanosis or edema Neurologic: Alert and oriented X 3, normal strength and tone. Normal symmetric reflexes. Normal coordination and gait  ECOG PERFORMANCE STATUS: 1 - Symptomatic but completely ambulatory  Blood pressure 124/78, pulse 90, temperature 97.9 F (36.6 C), temperature source Oral, resp. rate 17, height '6\' 1"'$  (1.854 m), weight 179 lb 4.8 oz (81.3 kg), SpO2 99 %.  LABORATORY DATA: Lab Results  Component Value Date   WBC 6.4 02/14/2016   HGB 14.7 02/14/2016   HCT 42.6 02/14/2016   MCV 90.1 02/14/2016   PLT 117 (L) 02/14/2016      Chemistry      Component Value Date/Time   NA 136 02/14/2016 1134   K 4.5 02/14/2016 1134   CL 103 12/31/2015 0732   CO2 19 (L) 02/14/2016 1134   BUN 15.7 02/14/2016 1134   CREATININE 1.2 02/14/2016 1134      Component Value Date/Time   CALCIUM 9.4 02/14/2016 1134   ALKPHOS 87 02/14/2016 1134   AST 29 02/14/2016 1134   ALT 26 02/14/2016 1134   BILITOT 0.38 02/14/2016 1134       RADIOGRAPHIC STUDIES: No results found.  ASSESSMENT AND PLAN: This is a very pleasant 75 years old white male recently diagnosed with a stage IIA (T1a, N1, M0) non-small cell lung cancer favoring adenocarcinoma presented with right hilar mass and right hilar adenopathy diagnosed in September 2017. The patient is not currently a good surgical candidate for resection because of the central location of his tumor and he may require right pneumonectomy if he  proceeded with surgery at this point. His case was discussed at the weekly thoracic conference and Dr. Servando Snare would like the patient to have a course of neoadjuvant concurrent chemoradiation before reevaluation for surgical resection. The patient is currently undergoing concurrent chemoradiation with weekly carboplatin and paclitaxel status post 3 cycles and tolerated the first 3 cycles of his treatment fairly well except for mild odynophagia. I recommended for the patient to proceed with cycle #4 today as a scheduled. He would come back for follow-up visit in 2 weeks for evaluation with the completion of his chemotherapy. For the odynophagia, I started the patient on Carafate 1 g by mouth before meals and also at night time. The patient voices understanding of current disease status and treatment options and is in agreement with the current care plan.  All questions were answered. The patient knows to call the clinic with any problems, questions or concerns. We can certainly see the patient much sooner if necessary.  Disclaimer: This note was dictated with voice recognition software. Similar sounding words can  inadvertently be transcribed and may not be corrected upon review.       

## 2016-02-15 ENCOUNTER — Telehealth: Payer: Self-pay | Admitting: Oncology

## 2016-02-15 ENCOUNTER — Encounter: Payer: Self-pay | Admitting: Radiation Oncology

## 2016-02-15 ENCOUNTER — Ambulatory Visit
Admission: RE | Admit: 2016-02-15 | Discharge: 2016-02-15 | Disposition: A | Discharge status: Home / Self Care | Payer: Medicare Other | Source: Ambulatory Visit | Attending: Radiation Oncology | Admitting: Radiation Oncology

## 2016-02-15 VITALS — BP 152/67 | HR 89 | Temp 98.1°F | Ht 73.0 in | Wt 177.4 lb

## 2016-02-15 DIAGNOSIS — C3491 Malignant neoplasm of unspecified part of right bronchus or lung: Secondary | ICD-10-CM

## 2016-02-15 DIAGNOSIS — Z510 Encounter for antineoplastic radiation therapy: Secondary | ICD-10-CM | POA: Diagnosis not present

## 2016-02-15 NOTE — Telephone Encounter (Signed)
Left a message advising Mark Howell that he will receive a bill for radiation after he finishes treatment.

## 2016-02-15 NOTE — Progress Notes (Signed)
  Radiation Oncology         (336) [phone] ________________________________  Name: Mark Howell MRN: [id number]  Date: 02/15/2016  DOB: [birth year]  Weekly Radiation Therapy Management  Clinical stage IIA(T1a, N1, M0) adenocarcinoma of the right lung     Current Dose: 28.8 Gy     Planned Dose:  45 Gy  Narrative . . . . . . . . The patient presents for routine under treatment assessment.                                  Mr.  Mark Howell has completed 16 fractions to his right lung.  He reports having a burning feeling in his right chest on Saturday after mowing his lawn.  He was given Carafate by Dr. Julien Nordmann and will pick it up today.  He reports having a cough with clear sputum.  He denies having shortness of breath.  He does report having fatigue.  He also has noticed it is harder to talk and sing.  He had chemo yesterday.  The skin on his right chest and right upper back is pink.  He is using sonafine   The chart was checked. Physical Findings. . .  height is '6\' 1"'$  (1.854 m) and weight is 177 lb 6.4 oz (80.5 kg). His oral temperature is 98.1 F (36.7 C). His blood pressure is 152/67 (abnormal) and his pulse is 89. His oxygen saturation is 100%. . Weight essentially stable.  No significant changes. The lungs are clear. The heart has a regular rhythm and rate Impression . . . . . . . The patient is tolerating radiation. Plan . . . . . . . . . . . . Continue treatment as planned.  ________________________________   Blair Promise, PhD, MD  This document serves as a record of services personally performed by Gery Pray, MD. It was created on his behalf by Truddie Hidden, a trained medical scribe. The creation of this record is based on the scribe's personal observations and the provider's statements to them. This document has been checked and approved by the attending provider.

## 2016-02-15 NOTE — Progress Notes (Signed)
Mark Howell has completed 16 fractions to his right lung.  He reports having a burning feeling in his right chest on Saturday after mowing his lawn.  He was given Carafate by Dr. Julien Nordmann and will pick it up today.  He reports having a cough with clear sputum.  He denies having shortness of breath.  He does report having fatigue.  He also has noticed it is harder to talk and sing.  He had chemo yesterday.  The skin on his right chest and right upper back is pink.  He is using sonafine.  BP (!) 152/67 (BP Location: Right Arm, Patient Position: Sitting)   Pulse 89   Temp 98.1 F (36.7 C) (Oral)   Ht '6\' 1"'$  (1.854 m)   Wt 177 lb 6.4 oz (80.5 kg)   SpO2 100%   BMI 23.41 kg/m    Wt Readings from Last 3 Encounters:  02/15/16 177 lb 6.4 oz (80.5 kg)  02/14/16 179 lb 4.8 oz (81.3 kg)  02/08/16 177 lb 9.6 oz (80.6 kg)

## 2016-02-16 ENCOUNTER — Ambulatory Visit
Admission: RE | Admit: 2016-02-16 | Discharge: 2016-02-16 | Disposition: A | Discharge status: Home / Self Care | Payer: Medicare Other | Source: Ambulatory Visit | Attending: Radiation Oncology | Admitting: Radiation Oncology

## 2016-02-16 DIAGNOSIS — Z510 Encounter for antineoplastic radiation therapy: Secondary | ICD-10-CM | POA: Diagnosis not present

## 2016-02-17 ENCOUNTER — Ambulatory Visit
Admission: RE | Admit: 2016-02-17 | Discharge: 2016-02-17 | Disposition: A | Discharge status: Home / Self Care | Payer: Medicare Other | Source: Ambulatory Visit | Attending: Radiation Oncology | Admitting: Radiation Oncology

## 2016-02-17 DIAGNOSIS — Z510 Encounter for antineoplastic radiation therapy: Secondary | ICD-10-CM | POA: Diagnosis not present

## 2016-02-18 ENCOUNTER — Ambulatory Visit
Admission: RE | Admit: 2016-02-18 | Discharge: 2016-02-18 | Disposition: A | Discharge status: Home / Self Care | Payer: Medicare Other | Source: Ambulatory Visit | Attending: Radiation Oncology | Admitting: Radiation Oncology

## 2016-02-18 DIAGNOSIS — Z510 Encounter for antineoplastic radiation therapy: Secondary | ICD-10-CM | POA: Diagnosis not present

## 2016-02-18 DIAGNOSIS — C3491 Malignant neoplasm of unspecified part of right bronchus or lung: Secondary | ICD-10-CM

## 2016-02-18 MED ORDER — SONAFINE EX EMUL: 1.0000 "application " | Freq: Once | CUTANEOUS | Status: DC

## 2016-02-18 MED ORDER — SONAFINE EX EMUL
1.0000 "application " | Freq: Once | CUTANEOUS | Status: AC
  Administered 2016-02-18: 1 via TOPICAL

## 2016-02-21 ENCOUNTER — Ambulatory Visit
Admission: RE | Admit: 2016-02-21 | Discharge: 2016-02-21 | Disposition: A | Discharge status: Home / Self Care | Payer: Medicare Other | Source: Ambulatory Visit | Attending: Radiation Oncology | Admitting: Radiation Oncology

## 2016-02-21 ENCOUNTER — Ambulatory Visit: Payer: Medicare Other

## 2016-02-21 ENCOUNTER — Other Ambulatory Visit (HOSPITAL_BASED_OUTPATIENT_CLINIC_OR_DEPARTMENT_OTHER): Payer: Medicare Other

## 2016-02-21 DIAGNOSIS — C3491 Malignant neoplasm of unspecified part of right bronchus or lung: Secondary | ICD-10-CM

## 2016-02-21 DIAGNOSIS — Z510 Encounter for antineoplastic radiation therapy: Secondary | ICD-10-CM | POA: Diagnosis not present

## 2016-02-21 DIAGNOSIS — C3401 Malignant neoplasm of right main bronchus: Secondary | ICD-10-CM

## 2016-02-21 LAB — CBC WITH DIFFERENTIAL/PLATELET
BASO%: 0.7 % (ref 0.0–2.0)
BASOS ABS: 0 10*3/uL (ref 0.0–0.1)
EOS ABS: 0.1 10*3/uL (ref 0.0–0.5)
EOS%: 1.2 % (ref 0.0–7.0)
HEMATOCRIT: 42 % (ref 38.4–49.9)
HEMOGLOBIN: 14.3 g/dL (ref 13.0–17.1)
LYMPH#: 0.8 10*3/uL — AB (ref 0.9–3.3)
LYMPH%: 13.4 % — ABNORMAL LOW (ref 14.0–49.0)
MCH: 30.6 pg (ref 27.2–33.4)
MCHC: 34.1 g/dL (ref 32.0–36.0)
MCV: 89.7 fL (ref 79.3–98.0)
MONO#: 0.6 10*3/uL (ref 0.1–0.9)
MONO%: 10.5 % (ref 0.0–14.0)
NEUT#: 4.3 10*3/uL (ref 1.5–6.5)
NEUT%: 74.2 % (ref 39.0–75.0)
PLATELETS: 72 10*3/uL — AB (ref 140–400)
RBC: 4.68 10*6/uL (ref 4.20–5.82)
RDW: 13 % (ref 11.0–14.6)
WBC: 5.9 10*3/uL (ref 4.0–10.3)

## 2016-02-21 LAB — COMPREHENSIVE METABOLIC PANEL
ALBUMIN: 3.8 g/dL (ref 3.5–5.0)
ALK PHOS: 86 U/L (ref 40–150)
ALT: 21 U/L (ref 0–55)
ANION GAP: 13 meq/L — AB (ref 3–11)
AST: 21 U/L (ref 5–34)
BUN: 16.8 mg/dL (ref 7.0–26.0)
CALCIUM: 9.3 mg/dL (ref 8.4–10.4)
CO2: 19 mEq/L — ABNORMAL LOW (ref 22–29)
Chloride: 104 mEq/L (ref 98–109)
Creatinine: 1.3 mg/dL (ref 0.7–1.3)
EGFR: 53 mL/min/{1.73_m2} — AB (ref >90)
Glucose: 170 mg/dl — ABNORMAL HIGH (ref 70–140)
POTASSIUM: 4.2 meq/L (ref 3.5–5.1)
Sodium: 136 mEq/L (ref 136–145)
Total Bilirubin: 0.71 mg/dL (ref 0.20–1.20)
Total Protein: 7.5 g/dL (ref 6.4–8.3)

## 2016-02-21 NOTE — Progress Notes (Signed)
Pt platelets 72 today. Dr. Julien Nordmann would like to postpone tx to allow pt plt count to recover. Discussed with pt and wife who verbalized understanding and are going to scheduling to plan another date for last taxol/carbo.

## 2016-02-21 NOTE — Patient Instructions (Signed)
Thrombocytopenia °Thrombocytopenia is a condition in which there is an abnormally small number of platelets in your blood. Platelets are also called thrombocytes. Platelets are needed for blood clotting. °CAUSES °Thrombocytopenia is caused by:  °· Decreased production of platelets. This can be caused by: °¨ Aplastic anemia in which your bone marrow quits making blood cells. °¨ Cancer in the bone marrow. °¨ Use of certain medicines, including chemotherapy. °¨ Infection in the bone marrow. °¨ Heavy alcohol consumption. °· Increased destruction of platelets. This can be caused by: °¨ Certain immune diseases. °¨ Use of certain drugs. °¨ Certain blood clotting disorders. °¨ Certain inherited disorders. °¨ Certain bleeding disorders. °¨ Pregnancy. °· Having an enlarged spleen (hypersplenism). In hypersplenism, the spleen gathers up platelets from circulation. This means the platelets are not available to help with blood clotting. The spleen can enlarge due to cirrhosis or other conditions. °SYMPTOMS  °The symptoms of thrombocytopenia are side effects of poor blood clotting. Some of these are: °· Abnormal bleeding. °· Nosebleeds. °· Heavy menstrual periods. °· Blood in the urine or stools. °· Purpura. This is a purplish discoloration in the skin produced by small bleeding vessels near the surface of the skin. °· Bruising. °· A rash that may be petechial. This looks like pinpoint, purplish-red spots on the skin and mucous membranes. It is caused by bleeding from small blood vessels (capillaries). °DIAGNOSIS  °Your caregiver will make this diagnosis based on your exam and blood tests. Sometimes, a bone marrow study is done to look for the original cells (megakaryocytes) that make platelets. °TREATMENT  °Treatment depends on the cause of the condition. °· Medicines may be given to help protect your platelets from being destroyed. °· In some cases, a replacement (transfusion) of platelets may be required to stop or prevent  bleeding. °· Sometimes, the spleen must be surgically removed. °HOME CARE INSTRUCTIONS  °· Check the skin and linings inside your mouth for bruising or bleeding as directed by your caregiver. °· Check your sputum, urine, and stool for blood as directed by your caregiver. °· Do not return to any activities that could cause bumps or bruises until your caregiver says it is okay. °· Take extra care not to cut yourself when shaving or when using scissors, needles, knives, and other tools. °· Take extra care not to burn yourself when ironing or cooking. °· Ask your caregiver if it is okay for you to drink alcohol. °· Only take over-the-counter or prescription medicines as directed by your caregiver. °· Notify all your caregivers, including dentists and eye doctors, about your condition. °SEEK IMMEDIATE MEDICAL CARE IF:  °· You develop active bleeding from anywhere in your body. °· You develop unexplained bruising or bleeding. °· You have blood in your sputum, urine, or stool. °MAKE SURE YOU: °· Understand these instructions. °· Will watch your condition. °· Will get help right away if you are not doing well or get worse. °  °This information is not intended to replace advice given to you by your health care provider. Make sure you discuss any questions you have with your health care provider. °  °Document Released: 04/17/2005 Document Revised: 07/10/2011 Document Reviewed: 10/19/2014 °Elsevier Interactive Patient Education ©2016 [employer] ° °

## 2016-02-22 ENCOUNTER — Ambulatory Visit
Admission: RE | Admit: 2016-02-22 | Discharge: 2016-02-22 | Disposition: A | Discharge status: Home / Self Care | Payer: Medicare Other | Source: Ambulatory Visit | Attending: Radiation Oncology | Admitting: Radiation Oncology

## 2016-02-22 ENCOUNTER — Encounter: Payer: Self-pay | Admitting: Radiation Oncology

## 2016-02-22 VITALS — BP 123/84 | HR 94 | Temp 97.6°F | Resp 18 | Ht 73.0 in | Wt 179.8 lb

## 2016-02-22 DIAGNOSIS — C3491 Malignant neoplasm of unspecified part of right bronchus or lung: Secondary | ICD-10-CM

## 2016-02-22 DIAGNOSIS — Z510 Encounter for antineoplastic radiation therapy: Secondary | ICD-10-CM | POA: Diagnosis not present

## 2016-02-22 NOTE — Progress Notes (Signed)
Mark Howell has completed 21 fractions to his right lung.  He reports having a burning feeling in his right chest .  Taking Carafate.  He reports having a cough with clear sputum.  He denies having shortness of breath.  He does report having fatigue mostly late evening.  He also has noticed it is harder to talk and sing.  He did not have chemo yesterday because platelets were too low.  The skin on his right chest and right upper back is pink.  He is using sonafine. Wt Readings from Last 3 Encounters:  02/22/16 179 lb 12.8 oz (81.6 kg)  02/15/16 177 lb 6.4 oz (80.5 kg)  02/14/16 179 lb 4.8 oz (81.3 kg)  BP 123/84 (BP Location: Right Arm, Patient Position: Sitting, Cuff Size: Normal)   Pulse 94   Temp 97.6 F (36.4 C) (Oral)   Resp 18   Ht '6\' 1"'$  (9.381 m)   Wt 179 lb 12.8 oz (81.6 kg)   SpO2 100%   BMI 23.72 kg/m

## 2016-02-22 NOTE — Progress Notes (Signed)
  Radiation Oncology         (336) [phone] ________________________________  Name: Mark Howell MRN: [id number]  Date: 02/22/2016  DOB: 1940-09-02  Weekly Radiation Therapy Management  Clinical stage IIA(T1a, N1, M0) adenocarcinoma of the right lung     Current Dose: 37.8 Gy     Planned Dose:  45 Gy  Narrative . . . . . . . . The patient presents for routine under treatment assessment.                                  Mr.  Howell has completed 21 fractions to his right lung. He reports having a burning feeling in his right chest . Taking Carafate. He reports having a cough with clear sputum. He denies having shortness of breath. He does report having fatigue mostly late evening. He also has noticed it is harder to talk and sing. He did not have chemo yesterday because platelets were too low. The skin on his right chest and right upper back is pink. He is using sonafine. The patient reports some burning in his mouth, as if he is "eating a hot pepper."    The chart was checked. Physical Findings. . .  height is '6\' 1"'$  (1.854 m) and weight is 179 lb 12.8 oz (81.6 kg). His oral temperature is 97.6 F (36.4 C). His blood pressure is 123/84 and his pulse is 94. His respiration is 18 and oxygen saturation is 100%. . Weight essentially stable.  No significant changes. The lungs are clear. The heart has a regular rhythm and rate. Oropharynx is clear, no secondary infection.   Impression . . . . . . . The patient is tolerating radiation. Plan . . . . . . . . . . . . Continue treatment as planned. After finishing treatment on Monday, I will follow up with the patient in approximately one month.    ________________________________   Blair Promise, PhD, MD  This document serves as a record of services personally performed by Gery Pray, MD. It was created on his behalf by Truddie Hidden, a trained medical scribe. The creation of this record is based on the scribe's personal observations  and the provider's statements to them. This document has been checked and approved by the attending provider.

## 2016-02-23 ENCOUNTER — Ambulatory Visit
Admission: RE | Admit: 2016-02-23 | Discharge: 2016-02-23 | Disposition: A | Discharge status: Home / Self Care | Payer: Medicare Other | Source: Ambulatory Visit | Attending: Radiation Oncology | Admitting: Radiation Oncology

## 2016-02-23 DIAGNOSIS — Z510 Encounter for antineoplastic radiation therapy: Secondary | ICD-10-CM | POA: Diagnosis not present

## 2016-02-24 ENCOUNTER — Ambulatory Visit
Admission: RE | Admit: 2016-02-24 | Discharge: 2016-02-24 | Disposition: A | Discharge status: Home / Self Care | Payer: Medicare Other | Source: Ambulatory Visit | Attending: Radiation Oncology | Admitting: Radiation Oncology

## 2016-02-24 DIAGNOSIS — Z510 Encounter for antineoplastic radiation therapy: Secondary | ICD-10-CM | POA: Diagnosis not present

## 2016-02-25 ENCOUNTER — Ambulatory Visit
Admission: RE | Admit: 2016-02-25 | Discharge: 2016-02-25 | Disposition: A | Discharge status: Home / Self Care | Payer: Medicare Other | Source: Ambulatory Visit | Attending: Radiation Oncology | Admitting: Radiation Oncology

## 2016-02-25 DIAGNOSIS — Z510 Encounter for antineoplastic radiation therapy: Secondary | ICD-10-CM | POA: Diagnosis not present

## 2016-02-28 ENCOUNTER — Encounter: Payer: Self-pay | Admitting: Radiation Oncology

## 2016-02-28 ENCOUNTER — Ambulatory Visit: Payer: Medicare Other

## 2016-02-28 ENCOUNTER — Other Ambulatory Visit (HOSPITAL_BASED_OUTPATIENT_CLINIC_OR_DEPARTMENT_OTHER): Payer: Medicare Other

## 2016-02-28 ENCOUNTER — Ambulatory Visit (HOSPITAL_BASED_OUTPATIENT_CLINIC_OR_DEPARTMENT_OTHER): Payer: Medicare Other | Admitting: Nurse Practitioner

## 2016-02-28 ENCOUNTER — Telehealth: Payer: Self-pay | Admitting: Internal Medicine

## 2016-02-28 VITALS — BP 135/85 | HR 90 | Temp 97.6°F | Resp 18 | Ht 73.0 in | Wt 182.6 lb

## 2016-02-28 DIAGNOSIS — C3401 Malignant neoplasm of right main bronchus: Secondary | ICD-10-CM | POA: Diagnosis not present

## 2016-02-28 DIAGNOSIS — C3491 Malignant neoplasm of unspecified part of right bronchus or lung: Secondary | ICD-10-CM

## 2016-02-28 DIAGNOSIS — D6959 Other secondary thrombocytopenia: Secondary | ICD-10-CM

## 2016-02-28 DIAGNOSIS — Z510 Encounter for antineoplastic radiation therapy: Secondary | ICD-10-CM | POA: Diagnosis not present

## 2016-02-28 LAB — COMPREHENSIVE METABOLIC PANEL
ALBUMIN: 4.1 g/dL (ref 3.5–5.0)
ALK PHOS: 88 U/L (ref 40–150)
ALT: 24 U/L (ref 0–55)
AST: 24 U/L (ref 5–34)
Anion Gap: 10 mEq/L (ref 3–11)
BUN: 15.1 mg/dL (ref 7.0–26.0)
CO2: 23 mEq/L (ref 22–29)
CREATININE: 1.4 mg/dL — AB (ref 0.7–1.3)
Calcium: 9.5 mg/dL (ref 8.4–10.4)
Chloride: 104 mEq/L (ref 98–109)
EGFR: 50 mL/min/{1.73_m2} — ABNORMAL LOW (ref >90)
GLUCOSE: 147 mg/dL — AB (ref 70–140)
POTASSIUM: 4.5 meq/L (ref 3.5–5.1)
SODIUM: 138 meq/L (ref 136–145)
TOTAL PROTEIN: 8 g/dL (ref 6.4–8.3)
Total Bilirubin: 0.59 mg/dL (ref 0.20–1.20)

## 2016-02-28 LAB — CBC WITH DIFFERENTIAL/PLATELET
BASO%: 0.2 % (ref 0.0–2.0)
Basophils Absolute: 0 10*3/uL (ref 0.0–0.1)
EOS%: 0.5 % (ref 0.0–7.0)
Eosinophils Absolute: 0 10*3/uL (ref 0.0–0.5)
HCT: 39.5 % (ref 38.4–49.9)
HEMOGLOBIN: 14.1 g/dL (ref 13.0–17.1)
LYMPH#: 0.5 10*3/uL — AB (ref 0.9–3.3)
LYMPH%: 7.7 % — ABNORMAL LOW (ref 14.0–49.0)
MCH: 31.7 pg (ref 27.2–33.4)
MCHC: 35.7 g/dL (ref 32.0–36.0)
MCV: 88.8 fL (ref 79.3–98.0)
MONO#: 0.7 10*3/uL (ref 0.1–0.9)
MONO%: 10.8 % (ref 0.0–14.0)
NEUT%: 80.8 % — ABNORMAL HIGH (ref 39.0–75.0)
NEUTROS ABS: 4.9 10*3/uL (ref 1.5–6.5)
Platelets: 62 10*3/uL — ABNORMAL LOW (ref 140–400)
RBC: 4.45 10*6/uL (ref 4.20–5.82)
RDW: 14.9 % — AB (ref 11.0–14.6)
WBC: 6.1 10*3/uL (ref 4.0–10.3)

## 2016-02-28 NOTE — Progress Notes (Signed)
  Mark Howell OFFICE PROGRESS NOTE   DIAGNOSIS: Stage IIA (T1a, N1, M0) non-small cell lung cancer, adenocarcinoma presented with right hilar mass with hilar lymphadenopathy diagnosed in September 2017.  PRIOR THERAPY: None  CURRENT THERAPY: Concurrent chemoradiation with weekly carboplatin for AUC of 2 and paclitaxel 45 MG/M2, first cycle 01/24/2016. Status post 4 cycles.    INTERVAL HISTORY:   Mark Howell returns as scheduled. He completed week 4 carboplatin/Taxol 02/14/2016. Treatment was held 02/21/2016 due to thrombocytopenia. He completed the course of radiation earlier today. He denies nausea/vomiting. No mouth sores. No diarrhea. He has mild dysphagia and odynophagia. He is tolerating liquids without difficulty. No bleeding except occasionally with nose blowing. He denies shortness of breath.  Objective:  Vital signs in last 24 hours:  Blood pressure 135/85, pulse 90, temperature 97.6 F (36.4 C), temperature source Oral, resp. rate 18, height '6\' 1"'$  (1.854 m), weight 182 lb 9.6 oz (82.8 kg), SpO2 100 %.    HEENT: No thrush or ulcers. Resp: Lungs clear bilaterally. Cardio: Regular rate and rhythm. GI: Abdomen soft and nontender. No hepatomegaly. Vascular: No leg edema.   Lab Results:  Lab Results  Component Value Date   WBC 6.1 02/28/2016   HGB 14.1 02/28/2016   HCT 39.5 02/28/2016   MCV 88.8 02/28/2016   PLT 62 (L) 02/28/2016   NEUTROABS 4.9 02/28/2016    Imaging:  No results found.  Medications: I have reviewed the patient's current medications.  Assessment/Plan: 1. Stage IIA (T1a, N1, M0) non-small cell lung cancer favoring adenocarcinoma, presented with right hilar mass and right hilar adenopathy diagnosed in September 2017. He has completed 4 weekly treatments with carboplatin/Taxol. Week 5 was held due to thrombocytopenia. Radiation completed 02/28/2016.  Disposition: Mark Howell appears stable. He completed the course of radiation earlier  today. He has progressive thrombocytopenia. Today's chemotherapy will be held. He understands to contact the office with any bleeding.   Dr. Julien Howell recommends a restaging CT scan of the chest in 6 weeks. He will return for a follow-up visit one to 2 days later to review the results. He will contact the office in the interim as outlined above or with any other problems.  Plan reviewed with Dr. Julien Howell.   Mark Howell ANP/GNP-BC   02/28/2016  12:23 PM

## 2016-02-28 NOTE — Telephone Encounter (Signed)
Appointments scheduled per 10/30 LOS. Patient given AVS report and calendars with future scheduled appointments. Patient informed about CT scan. Appointments scheduled to allow two hours between CT scan and MD appointment.

## 2016-02-29 ENCOUNTER — Ambulatory Visit: Payer: Medicare Other

## 2016-03-03 ENCOUNTER — Telehealth: Payer: Self-pay | Admitting: *Deleted

## 2016-03-03 NOTE — Telephone Encounter (Signed)
Wife reports patient is having chills, no fever and productive cough. Has not taken any OTC meds. States PCP office is closed on Fridays. Patient encouraged to go to UC. Wife states he will probably not go to UC. Plans to take OTC meds and drink fluids. WCB on Monday. Strongly encouraged to be seen at [facility].

## 2016-03-05 ENCOUNTER — Emergency Department (HOSPITAL_COMMUNITY): Payer: Medicare Other

## 2016-03-05 ENCOUNTER — Encounter (HOSPITAL_COMMUNITY): Payer: Self-pay | Admitting: Emergency Medicine

## 2016-03-05 ENCOUNTER — Emergency Department (HOSPITAL_COMMUNITY)
Admission: EM | Admit: 2016-03-05 | Discharge: 2016-03-05 | Disposition: A | Discharge status: Home / Self Care | Payer: Medicare Other | Attending: Emergency Medicine | Admitting: Emergency Medicine

## 2016-03-05 DIAGNOSIS — F17210 Nicotine dependence, cigarettes, uncomplicated: Secondary | ICD-10-CM | POA: Diagnosis not present

## 2016-03-05 DIAGNOSIS — R059 Cough, unspecified: Secondary | ICD-10-CM

## 2016-03-05 DIAGNOSIS — Z7982 Long term (current) use of aspirin: Secondary | ICD-10-CM | POA: Insufficient documentation

## 2016-03-05 DIAGNOSIS — I10 Essential (primary) hypertension: Secondary | ICD-10-CM | POA: Insufficient documentation

## 2016-03-05 DIAGNOSIS — C3490 Malignant neoplasm of unspecified part of unspecified bronchus or lung: Secondary | ICD-10-CM | POA: Diagnosis not present

## 2016-03-05 DIAGNOSIS — R05 Cough: Secondary | ICD-10-CM | POA: Diagnosis present

## 2016-03-05 DIAGNOSIS — Z79899 Other long term (current) drug therapy: Secondary | ICD-10-CM | POA: Diagnosis not present

## 2016-03-05 LAB — CBC WITH DIFFERENTIAL/PLATELET
BASOS ABS: 0 10*3/uL (ref 0.0–0.1)
BASOS PCT: 0 %
Eosinophils Absolute: 0.1 10*3/uL (ref 0.0–0.7)
Eosinophils Relative: 3 %
HEMATOCRIT: 39.7 % (ref 39.0–52.0)
HEMOGLOBIN: 13.9 g/dL (ref 13.0–17.0)
LYMPHS PCT: 27 %
Lymphs Abs: 0.6 10*3/uL — ABNORMAL LOW (ref 0.7–4.0)
MCH: 31.5 pg (ref 26.0–34.0)
MCHC: 35 g/dL (ref 30.0–36.0)
MCV: 90 fL (ref 78.0–100.0)
MONOS PCT: 25 %
Monocytes Absolute: 0.6 10*3/uL (ref 0.1–1.0)
NEUTROS ABS: 1 10*3/uL — AB (ref 1.7–7.7)
Neutrophils Relative %: 45 %
Platelets: 83 10*3/uL — ABNORMAL LOW (ref 150–400)
RBC: 4.41 MIL/uL (ref 4.22–5.81)
RDW: 15.7 % — ABNORMAL HIGH (ref 11.5–15.5)
WBC: 2.3 10*3/uL — ABNORMAL LOW (ref 4.0–10.5)

## 2016-03-05 LAB — COMPREHENSIVE METABOLIC PANEL
ALBUMIN: 4.8 g/dL (ref 3.5–5.0)
ALT: 32 U/L (ref 17–63)
AST: 37 U/L (ref 15–41)
Alkaline Phosphatase: 69 U/L (ref 38–126)
Anion gap: 11 (ref 5–15)
BUN: 20 mg/dL (ref 6–20)
CHLORIDE: 99 mmol/L — AB (ref 101–111)
CO2: 27 mmol/L (ref 22–32)
Calcium: 9.2 mg/dL (ref 8.9–10.3)
Creatinine, Ser: 1.42 mg/dL — ABNORMAL HIGH (ref 0.61–1.24)
GFR calc Af Amer: 54 mL/min — ABNORMAL LOW (ref >60)
GFR calc non Af Amer: 47 mL/min — ABNORMAL LOW (ref >60)
GLUCOSE: 196 mg/dL — AB (ref 65–99)
POTASSIUM: 4 mmol/L (ref 3.5–5.1)
Sodium: 137 mmol/L (ref 135–145)
Total Bilirubin: 0.6 mg/dL (ref 0.3–1.2)
Total Protein: 7.9 g/dL (ref 6.5–8.1)

## 2016-03-05 LAB — URINALYSIS, ROUTINE W REFLEX MICROSCOPIC
Bilirubin Urine: NEGATIVE
GLUCOSE, UA: NEGATIVE mg/dL
HGB URINE DIPSTICK: NEGATIVE
Ketones, ur: NEGATIVE mg/dL
Leukocytes, UA: NEGATIVE
Nitrite: NEGATIVE
Protein, ur: NEGATIVE mg/dL
SPECIFIC GRAVITY, URINE: 1.025 (ref 1.005–1.030)
pH: 6 (ref 5.0–8.0)

## 2016-03-05 LAB — I-STAT CG4 LACTIC ACID, ED: LACTIC ACID, VENOUS: 1.54 mmol/L (ref 0.5–1.9)

## 2016-03-05 MED ORDER — GUAIFENESIN-DM 100-10 MG/5ML PO SYRP: 5.0000 mL | ORAL_SOLUTION | ORAL | 0 refills | Status: DC | PRN

## 2016-03-05 NOTE — ED Notes (Signed)
Pt given cup of water 

## 2016-03-05 NOTE — ED Notes (Signed)
Pt advised per MD order UA/urine cx; provided urinal at bedside. Pt states unable to urinate at this time, will provide sample as soon as possible. ENM

## 2016-03-05 NOTE — ED Notes (Signed)
Pt is unable to give urine sample at this time.

## 2016-03-05 NOTE — ED Provider Notes (Signed)
[facility] DEPT Provider Note   CSN: [id number] Arrival date & time: 03/05/16  1313     History   Chief Complaint Chief Complaint  Patient presents with  . Cough  . chemo card    HPI Mark Howell is a 75 y.o. male.  HPI Patient has had chills and sweats for 4 days. No associated fever. The symptoms wax and wane. Patient poor she has had a cough. Sputum has been clear. No associated chest pain or dyspnea. No abdominal pain nausea vomiting or diarrhea. No lower extremity pain or swelling. No rashes. Patient is undergoing chemotherapy for non-small cell lung cancer. He has had 4 treatments. He reports he has had symptoms of esophagitis. Patient is treated by Dr. Earlie Server. Past Medical History:  Diagnosis Date  . Arthritis   . Complication of anesthesia    problems voiding post op  . Full dentures   . GERD (gastroesophageal reflux disease)   . HOH (hard of hearing)   . HTN (hypertension)   . Hyperlipidemia   . Lung mass 12/30/2015  . Psoriatic arthritis ([place])   . Snores   . Wears glasses     Patient Active Problem List   Diagnosis Date Noted  . Adenocarcinoma of right lung, stage 2 ([place]) 01/13/2016  . Encounter for antineoplastic chemotherapy 01/13/2016  . Lung mass 12/30/2015  . Snoring 03/19/2012    Past Surgical History:  Procedure Laterality Date  . COLONOSCOPY    . HEMORRHOID SURGERY N/A 10/01/2013   Procedure: EXAM UNDER ANESTHESIA  AND EXCISIONAL HEMORRHOIDECTOMY WITH HEMORRHOID BANDING X 2;  Surgeon: Gayland Curry, MD;  Location: [facility];  Service: General;  Laterality: N/A;  . INGUINAL HERNIA REPAIR  01/04/2012   Procedure: LAPAROSCOPIC INGUINAL HERNIA;  Surgeon: Gayland Curry, MD,FACS;  Location: [facility];  Service: General;  Laterality: Right;  Marland Kitchen VIDEO BRONCHOSCOPY WITH ENDOBRONCHIAL ULTRASOUND N/A 12/31/2015   Procedure: VIDEO BRONCHOSCOPY WITH ENDOBRONCHIAL ULTRASOUND transbronchial biopsy of node 10 R lymph node;  Surgeon: Grace Isaac, MD;  Location: [facility] OR;  Service: Thoracic;  Laterality: N/A;       Home Medications    Prior to Admission medications   Medication Sig Start Date End Date Taking? Authorizing Provider  acetaminophen (TYLENOL) 500 MG tablet Take 500-1,000 mg by mouth 2 (two) times daily as needed for mild pain.   Yes Historical Provider, MD  aspirin EC 81 MG tablet Take 81 mg by mouth at bedtime.    Yes Historical Provider, MD  aspirin-sod bicarb-citric acid (ALKA-SELTZER) 325 MG TBEF tablet Take 325 mg by mouth every 6 (six) hours as needed (indigestion).   Yes Historical Provider, MD  benzonatate (TESSALON) 100 MG capsule Take 100 mg by mouth 3 (three) times daily as needed for cough.   Yes Historical Provider, MD  clotrimazole-betamethasone (LOTRISONE) cream Apply 1 application topically 2 (two) times daily. 02/29/16 02/28/17 Yes Historical Provider, MD  dextromethorphan (DELSYM) 30 MG/5ML liquid Take 30 mg by mouth 2 (two) times daily as needed for cough.   Yes Historical Provider, MD  diphenhydramine-acetaminophen (TYLENOL PM) 25-500 MG TABS Take 1 tablet by mouth at bedtime. For sleep   Yes Historical Provider, MD  ENBREL SURECLICK 50 MG/ML injection Inject 50 mg into the skin once a week. Tuesday 10/05/14  Yes Historical Provider, MD  Multiple Vitamin (MULITIVITAMIN WITH MINERALS) TABS Take 1 tablet by mouth daily.   Yes Historical Provider, MD  pantoprazole (PROTONIX) 40 MG tablet Take 40 mg  by mouth daily.    Yes Historical Provider, MD  pravastatin (PRAVACHOL) 40 MG tablet Take 40 mg by mouth at bedtime.  10/17/13  Yes Historical Provider, MD  predniSONE (DELTASONE) 10 MG tablet Take 5 mg by mouth every other day.    Yes Historical Provider, MD  sucralfate (CARAFATE) 1 g tablet Take 1 tablet (1 g total) by mouth 4 (four) times daily -  with meals and at bedtime. 02/14/16  Yes Curt Bears, MD  tamsulosin (FLOMAX) 0.4 MG CAPS capsule TAKE (1) CAPSULE DAILY, START 4 DAYS BEFORE  PROCEDURE Patient taking differently: take 1 capsule by mouth every morning 11/20/13  Yes Greer Pickerel, MD  guaiFENesin-dextromethorphan (ROBITUSSIN DM) 100-10 MG/5ML syrup Take 5 mLs by mouth every 4 (four) hours as needed for cough. 03/05/16   Charlesetta Shanks, MD  prochlorperazine (COMPAZINE) 10 MG tablet Take 1 tablet (10 mg total) by mouth every 6 (six) hours as needed for nausea or vomiting. Patient not taking: Reported on 03/05/2016 01/13/16   Curt Bears, MD  Wound Dressings [facility]) Apply 1 application topically 2 (two) times daily.    Historical Provider, MD    Family History Family History  Problem Relation Age of Onset  . Stroke Father   . Diabetes Father   . Heart disease Father   . Cancer Brother     pancreatic    Social History Social History  Substance Use Topics  . Smoking status: Current Every Day Smoker    Packs/day: 0.50    Years: 50.00    Types: Cigarettes  . Smokeless tobacco: Never Used  . Alcohol use Yes     Comment: occassionally     Allergies   Hydrocodone and Oxycodone   Review of Systems Review of Systems 10 Systems reviewed and are negative for acute change except as noted in the HPI.   Physical Exam Updated Vital Signs BP 124/85 (BP Location: Right Arm)   Pulse 93   Temp 98 F (36.7 C) (Oral)   Resp 17   SpO2 97%   Physical Exam  Constitutional: He is oriented to person, place, and time. He appears well-developed and well-nourished.  HENT:  Head: Normocephalic and atraumatic.  Right Ear: External ear normal.  Left Ear: External ear normal.  Nose: Nose normal.  Mouth/Throat: Oropharynx is clear and moist.  Eyes: Conjunctivae and EOM are normal. Pupils are equal, round, and reactive to light.  Neck: Neck supple.  Cardiovascular: Normal rate, regular rhythm, normal heart sounds and intact distal pulses.   No murmur heard. Pulmonary/Chest: Effort normal and breath sounds normal. No respiratory distress.  Abdominal: Soft. He  exhibits no distension. There is no tenderness.  Musculoskeletal: Normal range of motion. He exhibits no edema, tenderness or deformity.  Neurological: He is alert and oriented to person, place, and time. No cranial nerve deficit. He exhibits normal muscle tone. Coordination normal.  Skin: Skin is warm and dry. No rash noted.  Psychiatric: He has a normal mood and affect.  Nursing note and vitals reviewed.    ED Treatments / Results  Labs (all labs ordered are listed, but only abnormal results are displayed) Labs Reviewed  COMPREHENSIVE METABOLIC PANEL - Abnormal; Notable for the following:       Result Value   Chloride 99 (*)    Glucose, Bld 196 (*)    Creatinine, Ser 1.42 (*)    GFR calc non Af Amer 47 (*)    GFR calc Af Amer 54 (*)  All other components within normal limits  CBC WITH DIFFERENTIAL/PLATELET - Abnormal; Notable for the following:    WBC 2.3 (*)    RDW 15.7 (*)    Platelets 83 (*)    Neutro Abs 1.0 (*)    Lymphs Abs 0.6 (*)    All other components within normal limits  CULTURE, BLOOD (ROUTINE X 2)  CULTURE, BLOOD (ROUTINE X 2)  URINE CULTURE  URINALYSIS, ROUTINE W REFLEX MICROSCOPIC (NOT AT [facility])  I-STAT CG4 LACTIC ACID, ED  I-STAT CG4 LACTIC ACID, ED    EKG  EKG Interpretation None       Radiology Dg Chest 2 View  Result Date: 03/05/2016 CLINICAL DATA:  Cough. Sore throat. Sepsis. Lung cancer diagnosed in August. Ex-smoker. EXAM: CHEST  2 VIEW COMPARISON:  PET of 12/27/2015.  Chest radiograph of 12/03/2015. FINDINGS: Hyperinflation. Lower thoracic spondylosis. Midline trachea. Normal heart size. Atherosclerosis in the transverse aorta. Right hilar adenopathy is not Plain film evident. No pleural effusion or pneumothorax. Biapical pleural thickening. Left lower lobe scarring. IMPRESSION: No acute cardiopulmonary disease. Aortic atherosclerosis. Electronically Signed   By: Abigail Miyamoto M.D.   On: 03/05/2016 14:02    Procedures Procedures (including  critical care time)  Medications Ordered in ED Medications - No data to display   Initial Impression / Assessment and Plan / ED Course  I have reviewed the triage vital signs and the nursing notes.  Pertinent labs & imaging results that were available during my care of the patient were reviewed by me and considered in my medical decision making (see chart for details).  Clinical Course     Consult: Reviewed Dr. Deberah Pelton, no further recommendations at this time. Will plan to have patient follow-up as an outpatient.  Final Clinical Impressions(s) / ED Diagnoses   Final diagnoses:  Cough  Malignant neoplasm of lung, unspecified laterality, unspecified part of lung ([place])   Patient is alert and nontoxic. He has been having cough productive of clear sputum. No documented fever at home. Waxing and waning chills and sweats. Blood cultures pending. Other diagnostic studies do not indicate acute source of infection. Patient will have close follow-up with his oncologist. He is aware of the need to return immediately should he develop fever or worsening symptoms. New Prescriptions New Prescriptions   GUAIFENESIN-DEXTROMETHORPHAN (ROBITUSSIN DM) 100-10 MG/5ML SYRUP    Take 5 mLs by mouth every 4 (four) hours as needed for cough.     Charlesetta Shanks, MD 03/05/16 [phone]

## 2016-03-05 NOTE — ED Triage Notes (Signed)
Pt reports productive cough, runny nose and sore throat for the past 2 days. Last chemo almost 3 weeks ago for lung ca.

## 2016-03-06 ENCOUNTER — Encounter: Payer: Self-pay | Admitting: Nurse Practitioner

## 2016-03-06 ENCOUNTER — Telehealth: Payer: Self-pay | Admitting: *Deleted

## 2016-03-06 ENCOUNTER — Ambulatory Visit (HOSPITAL_BASED_OUTPATIENT_CLINIC_OR_DEPARTMENT_OTHER): Payer: Medicare Other | Admitting: Nurse Practitioner

## 2016-03-06 VITALS — BP 164/98 | HR 110 | Temp 98.6°F | Resp 20 | Wt 178.5 lb

## 2016-03-06 DIAGNOSIS — C3491 Malignant neoplasm of unspecified part of right bronchus or lung: Secondary | ICD-10-CM | POA: Diagnosis not present

## 2016-03-06 DIAGNOSIS — J700 Acute pulmonary manifestations due to radiation: Secondary | ICD-10-CM | POA: Diagnosis not present

## 2016-03-06 LAB — URINE CULTURE: CULTURE: NO GROWTH

## 2016-03-06 MED ORDER — METHYLPREDNISOLONE 4 MG PO TBPK: ORAL_TABLET | ORAL | 0 refills | Status: DC

## 2016-03-06 MED ORDER — ALBUTEROL SULFATE (2.5 MG/3ML) 0.083% IN NEBU
2.5000 mg | INHALATION_SOLUTION | Freq: Once | RESPIRATORY_TRACT | Status: AC
  Administered 2016-03-06: 2.5 mg via RESPIRATORY_TRACT
  Filled 2016-03-06: qty 3

## 2016-03-06 MED ORDER — ALBUTEROL SULFATE HFA 108 (90 BASE) MCG/ACT IN AERS: 1.0000 | INHALATION_SPRAY | Freq: Four times a day (QID) | RESPIRATORY_TRACT | 2 refills | Status: DC | PRN

## 2016-03-06 MED ORDER — ALBUTEROL SULFATE (2.5 MG/3ML) 0.083% IN NEBU
INHALATION_SOLUTION | RESPIRATORY_TRACT | Status: AC
  Filled 2016-03-06: qty 3

## 2016-03-06 NOTE — Telephone Encounter (Addendum)
Pt's wife called back, cough seems to be getting worse. Chest feels tight, she wants him seen by provider today. Discussed with Drue Second, NP: OK to work pt in today. Message to schedulers.

## 2016-03-06 NOTE — Assessment & Plan Note (Addendum)
Patient states that he has been experiencing increased, nonproductive cough for the past week or so.  He states that the cough keeps him awake; and he has become increasingly short of breath as well.  He denies any recent fevers or chills.  Patient received his last/final cycle 4.  Carboplatin/Taxol chemotherapy on 02/14/2016.  He received his last radiation treatment on 02/28/2016.  Exam today reveals some trace wheezes to his bilateral lower lung fields; and a chronic, dry cough.  Patient also appears mildly short of breath as well; even though, O2 sat is 98% on room air.  Patient was afebrile today as well.  Patient notes that he presented to the emergency department just yesterday for further evaluation; and chest x-ray that time revealed no acute findings.  Also, patient had both blood cultures 2 and urine culture obtained and pending results.  Labs obtained yesterday revealed WBC was 2.3, ANC was 1.0, hemoglobin was 13.9, and platelet count was 83.  Patient was given an LP drawn nebulizer treatment while at the cancer center; which appeared to help the patient breathe much easier.  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  Reviewed all findings with Dr. 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  Patient will receive a prescription for Medrol Dosepak to see if this helps.  Recommended that patient continue with over-the-counter cough medications and the [employer] he already has at home.  Advised both patient and his wife that would need to consider longer-term steroids if the Medrol Dosepak is effective.  Also, advised patient to call/return or go directly to the emergency department for any worsening symptoms whatsoever.

## 2016-03-06 NOTE — Assessment & Plan Note (Signed)
Patient received his final cycle 4, Carbo, pleasant/Taxol chemotherapy on 02/14/2016.  He completed his last radiation treatment on 02/28/2016.  He is undergoing observation only at this present time.  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  Patient is scheduled to return on 04/06/2016 for labs and a follow-up visit.  He is due for restaging scan prior to that visit on 04/06/2016.

## 2016-03-06 NOTE — Telephone Encounter (Signed)
Call from pt's wife reporting he went to ED on 11/5 for uncontrollable cough. She reports he is unable to eat/sleep or rest due to cough. Pt is taking Tessalon and Delsym for cough. Cough is dry. Pt reports dyspnea, chest and abdominal pain due to cough. Denies fever.  Wife is asking for direction on what to do next re: cough.

## 2016-03-06 NOTE — Progress Notes (Signed)
SYMPTOM MANAGEMENT CLINIC    Chief Complaint: Cough  HPI:  Mark Howell 75 y.o. male diagnosed with lung cancer.  Recently completed carbo/Taxol chemotherapy regimen and radiation treatments.  Currently undergoing observation only.   Oncology History   Patient presented with abdominal pain and weight loss.  Work up showed incidental finding of lung mass.        Lung mass (Resolved)   12/03/2015 Imaging    CT Abd/Pelvis      12/03/2015 Imaging    CXR IMPRESSION: Interstitial changes in the left lung which are nonspecific and could be acute or chronic.      12/17/2015 Imaging    CT Chest IMPRESSION: Bilateral pulmonary nodules with evidence of right hilar adenopathy      12/27/2015 Imaging    PET IMPRESSION: Hypermetabolic right hilar mass worrisome for central bronchogenic carcinoma or metastatic disease to right hilar lymph nodes.       12/30/2015 Initial Diagnosis    Lung mass     12/31/2015 Surgery    Fiberoptic bronchoscopy under general anesthesia. Endoscopic bronchial ultrasound with transbronchial biopsies of 10R lymph nodes.      01/04/2016 Pathology Results    FINE NEEDLE ASPIRATION,ENDOSCOPIC (C) EBUS 10R # 3 (SPECIMEN 3 OF 3,COLLECTED ON 12/31/15) MALIGNANT CELLS CONSISTENT WITH NON-SMALL CELL CARCINOMA.        Review of Systems  Respiratory: Positive for cough, shortness of breath and wheezing.   All other systems reviewed and are negative.   Past Medical History:  Diagnosis Date  . Arthritis   . Complication of anesthesia    problems voiding post op  . Full dentures   . GERD (gastroesophageal reflux disease)   . HOH (hard of hearing)   . HTN (hypertension)   . Hyperlipidemia   . Lung mass 12/30/2015  . Psoriatic arthritis ([place])   . Snores   . Wears glasses     Past Surgical History:  Procedure Laterality Date  . COLONOSCOPY    . HEMORRHOID SURGERY N/A 10/01/2013   Procedure: EXAM UNDER ANESTHESIA  AND EXCISIONAL HEMORRHOIDECTOMY WITH  HEMORRHOID BANDING X 2;  Surgeon: Gayland Curry, MD;  Location: [facility];  Service: General;  Laterality: N/A;  . 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    is allergic to hydrocodone and oxycodone.    Medication List       Accurate as of 03/06/16  6:29 PM. Always use your most recent med list.          acetaminophen 500 MG tablet Commonly known as:  TYLENOL Take 500-1,000 mg by mouth 2 (two) times daily as needed for mild pain.   albuterol 108 (90 Base) MCG/ACT inhaler Commonly known as:  PROVENTIL HFA;VENTOLIN HFA Inhale 1-2 puffs into the lungs every 6 (six) hours as needed for wheezing or shortness of breath (Wheeze).   aspirin EC 81 MG tablet Take 81 mg by mouth at bedtime.   aspirin-sod bicarb-citric acid 325 MG Tbef tablet  Commonly known as:  ALKA-SELTZER Take 325 mg by mouth every 6 (six) hours as needed (indigestion).   benzonatate 100 MG capsule Commonly known as:  TESSALON Take 100 mg by mouth 3 (three) times daily as needed for cough.   clotrimazole-betamethasone cream Commonly known as:  LOTRISONE Apply 1 application topically 2 (two) times daily.   dextromethorphan 30 MG/5ML liquid Commonly known as:  DELSYM Take 30 mg by mouth 2 (two) times daily as needed for cough.   diphenhydramine-acetaminophen 25-500 MG Tabs tablet Commonly known as:  TYLENOL PM Take 1 tablet by mouth at bedtime. For sleep     ENBREL SURECLICK 50 MG/ML injection Generic drug:  etanercept Inject 50 mg into the skin once a week. Tuesday   guaiFENesin-dextromethorphan 100-10 MG/5ML syrup Commonly known as:  ROBITUSSIN DM Take 5 mLs by mouth every 4 (four) hours as needed for cough.   methylPREDNISolone 4 MG Tbpk tablet Commonly known as:  MEDROL DOSEPAK Medrol dose pak: take as directed.   multivitamin with minerals Tabs tablet Take 1 tablet by mouth daily.   pantoprazole 40 MG tablet Commonly known as:  PROTONIX Take 40 mg by mouth daily.   pravastatin 40 MG tablet Commonly known as:  PRAVACHOL Take 40 mg by mouth at bedtime.   predniSONE 10 MG tablet Commonly known as:  DELTASONE Take 5 mg by mouth every other day.   prochlorperazine 10 MG tablet Commonly known as:  COMPAZINE Take 1 tablet (10 mg total) by mouth every 6 (six) hours as needed for nausea or vomiting.   SONAFINE Apply 1 application topically 2 (two) times daily.   sucralfate 1 g tablet Commonly known as:  CARAFATE Take 1 tablet (1 g total) by mouth 4 (four) times daily -  with meals and at bedtime.   tamsulosin 0.4 MG Caps capsule Commonly known as:  FLOMAX TAKE (1) CAPSULE DAILY, START 4 DAYS BEFORE PROCEDURE        PHYSICAL EXAMINATION  Oncology Vitals 03/06/2016 03/05/2016  Height - -  Weight 80.967 kg -  Weight (lbs) 178 lbs 8 oz -  BMI (kg/m2) 23.55 kg/m2 -  Temp 98.6 -  Pulse 110 92  Resp 20 18  SpO2 100 97  BSA (m2) 2.04 m2 -   BP Readings from Last 2 Encounters:  03/06/16 (!) 164/98  03/05/16 115/80    Physical Exam  Constitutional: He is oriented to person, place, and time. He appears unhealthy.  HENT:  Head: Normocephalic and atraumatic.  Mouth/Throat: Oropharynx is clear and moist.  Eyes: Conjunctivae and EOM are normal. Pupils are equal, round, and reactive to light. Right eye exhibits no discharge. Left eye exhibits no discharge. No scleral icterus.  Neck: Normal range of motion. Neck supple. No  JVD present. No tracheal deviation present. No thyromegaly present.  Cardiovascular: Normal rate, regular rhythm, normal heart sounds and intact distal pulses.   Pulmonary/Chest: No respiratory distress. He has wheezes. He has no rales. He exhibits no tenderness.  Patient appears mildly short of breath and is continuously coughing.  Wheezes to bilateral lower lung fields.  Abdominal: Soft. Bowel sounds are normal. He exhibits no distension and no mass. There is no tenderness. There is no rebound and no guarding.  Musculoskeletal: Normal range of motion. He exhibits no edema, tenderness or deformity.  Lymphadenopathy:    He has no cervical adenopathy.  Neurological: He is alert and oriented to person, place, and time. Gait normal.  Skin: Skin is warm and dry. No  rash noted. No erythema. No pallor.  Psychiatric: Affect normal.  Nursing note and vitals reviewed.   LABORATORY DATA:. Admission on 03/05/2016, Discharged on 03/05/2016  Component Date Value Ref Range Status  . Sodium 03/05/2016 137  135 - 145 mmol/L Final  . Potassium 03/05/2016 4.0  3.5 - 5.1 mmol/L Final  . Chloride 03/05/2016 99* 101 - 111 mmol/L Final  . CO2 03/05/2016 27  22 - 32 mmol/L Final  . Glucose, Bld 03/05/2016 196* 65 - 99 mg/dL Final  . BUN 62/53/1033 20  6 - 20 mg/dL Final  . Creatinine, Ser 03/05/2016 1.42* 0.61 - 1.24 mg/dL Final  . Calcium 75/26/1611 9.2  8.9 - 10.3 mg/dL Final  . Total Protein 03/05/2016 7.9  6.5 - 8.1 g/dL Final  . Albumin 96/38/1203 4.8  3.5 - 5.0 g/dL Final  . AST 22/52/0133 37  15 - 41 U/L Final  . ALT 03/05/2016 32  17 - 63 U/L Final  . Alkaline Phosphatase 03/05/2016 69  38 - 126 U/L Final  . Total Bilirubin 03/05/2016 0.6  0.3 - 1.2 mg/dL Final  . GFR calc non Af Amer 03/05/2016 47* >60 mL/min Final  . GFR calc Af Amer 03/05/2016 54* >60 mL/min Final   Comment: (NOTE) The eGFR has been calculated using the CKD EPI equation. This calculation has not been validated in all clinical  situations. eGFR's persistently <60 mL/min signify possible Chronic Kidney Disease.   . Anion gap 03/05/2016 11  5 - 15 Final  . Specimen Description 03/06/2016 BLOOD RIGHT ANTECUBITAL   Final  . Special Requests 03/06/2016 BOTTLES DRAWN AEROBIC AND ANAEROBIC 5 CC EACH   Final  . Culture 03/06/2016    Final                   Value:NO GROWTH < 24 HOURS Performed at [facility]   . Report Status 03/06/2016 PENDING   Incomplete  . Specimen Description 03/06/2016 BLOOD BLOOD RIGHT FOREARM   Final  . Special Requests 03/06/2016 BOTTLES DRAWN AEROBIC ONLY 5 CC   Final  . Culture 03/06/2016    Final                   Value:NO GROWTH < 24 HOURS Performed at [facility]   . Report Status 03/06/2016 PENDING   Incomplete  . Color, Urine 03/05/2016 YELLOW  YELLOW Final  . APPearance 03/05/2016 CLEAR  CLEAR Final  . Specific Gravity, Urine 03/05/2016 1.025  1.005 - 1.030 Final  . pH 03/05/2016 6.0  5.0 - 8.0 Final  . Glucose, UA 03/05/2016 NEGATIVE  NEGATIVE mg/dL Final  . Hgb urine dipstick 03/05/2016 NEGATIVE  NEGATIVE Final  . Bilirubin Urine 03/05/2016 NEGATIVE  NEGATIVE Final  . Ketones, ur 03/05/2016 NEGATIVE  NEGATIVE mg/dL Final  . Protein, ur 97/88/5093 NEGATIVE  NEGATIVE mg/dL Final  . Nitrite 66/48/2240 NEGATIVE  NEGATIVE Final  . Leukocytes, UA 03/05/2016 NEGATIVE  NEGATIVE Final  . Specimen Description 03/06/2016 URINE, CLEAN CATCH   Final  . Special Requests 03/06/2016 NONE   Final  . Culture 03/06/2016    Final                   Value:NO GROWTH Performed at [facility]   . Report Status 03/06/2016 03/06/2016 FINAL   Final  . Lactic Acid, Venous 03/05/2016 1.54  0.5 - 1.9 mmol/L Final  . WBC 03/05/2016 2.3* 4.0 - 10.5 K/uL Final  . RBC 03/05/2016 4.41  4.22 - 5.81 MIL/uL Final  . Hemoglobin 03/05/2016 13.9  13.0 - 17.0 g/dL Final  . HCT 03/05/2016 39.7  39.0 - 52.0 % Final  . MCV 03/05/2016 90.0  78.0 - 100.0 fL Final  . MCH 03/05/2016 31.5  26.0 -  34.0 pg Final  . MCHC 03/05/2016 35.0  30.0 - 36.0 g/dL Final  . RDW 03/05/2016 15.7* 11.5 - 15.5 % Final  . Platelets 03/05/2016 83* 150 - 400 K/uL Final   Comment: RESULT REPEATED AND VERIFIED SPECIMEN CHECKED FOR CLOTS PLATELET COUNT CONFIRMED BY SMEAR   . Neutrophils Relative % 03/05/2016 45  % Final  . Lymphocytes Relative 03/05/2016 27  % Final  . Monocytes Relative 03/05/2016 25  % Final  . Eosinophils Relative 03/05/2016 3  % Final  . Basophils Relative 03/05/2016 0  % Final  . Neutro Abs 03/05/2016 1.0* 1.7 - 7.7 K/uL Final  . Lymphs Abs 03/05/2016 0.6* 0.7 - 4.0 K/uL Final  . Monocytes Absolute 03/05/2016 0.6  0.1 - 1.0 K/uL Final  . Eosinophils Absolute 03/05/2016 0.1  0.0 - 0.7 K/uL Final  . Basophils Absolute 03/05/2016 0.0  0.0 - 0.1 K/uL Final  . Smear Review 03/05/2016 MORPHOLOGY UNREMARKABLE   Final    RADIOGRAPHIC STUDIES: Dg Chest 2 View  Result Date: 03/05/2016 CLINICAL DATA:  Cough. Sore throat. Sepsis. Lung cancer diagnosed in August. Ex-smoker. EXAM: CHEST  2 VIEW COMPARISON:  PET of 12/27/2015.  Chest radiograph of 12/03/2015. FINDINGS: Hyperinflation. Lower thoracic spondylosis. Midline trachea. Normal heart size. Atherosclerosis in the transverse aorta. Right hilar adenopathy is not Plain film evident. No pleural effusion or pneumothorax. Biapical pleural thickening. Left lower lobe scarring. IMPRESSION: No acute cardiopulmonary disease. Aortic atherosclerosis. Electronically Signed   By: Abigail Miyamoto M.D.   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  He states that the cough keeps him awake; and he has become increasingly short of breath as well.  He denies any recent fevers or chills.  Patient received his last/final cycle 4.  Carboplatin/Taxol chemotherapy on 02/14/2016.  He received his last radiation treatment on  02/28/2016.  Exam today reveals some trace wheezes to his bilateral lower lung fields; and a chronic, dry cough.  Patient also appears mildly short of breath as well; even though, O2 sat is 98% on room air.  Patient was afebrile today as well.  Patient notes that he presented to the emergency department just yesterday for further evaluation; and chest x-ray that time revealed no acute findings.  Also, patient had both blood cultures 2 and urine culture obtained and pending results.  Labs obtained yesterday revealed WBC was 2.3, ANC was 1.0, hemoglobin was 13.9, and platelet count was 83.  Patient was given an LP drawn nebulizer treatment while at the cancer center; which appeared to help the patient breathe much easier.  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  Reviewed all findings with Dr. 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  Patient will receive a prescription for Medrol Dosepak to see if this helps.  Recommended that patient continue with over-the-counter cough medications and the [employer] he already has at home.  Advised both patient and his wife that would need to consider longer-term steroids if the Medrol Dosepak is effective.  Also,  advised patient to call/return or go directly to the emergency department for any worsening symptoms whatsoever.  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  He completed his last radiation treatment on 02/28/2016.  He is undergoing observation only at this present time.  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  Patient is scheduled to return on 04/06/2016 for labs and a follow-up visit.  He is due for restaging scan prior to that visit on 04/06/2016.   Patient stated  understanding of all instructions; and was in agreement with this plan of care. The patient knows to call the clinic with any problems, questions or concerns.   Total time spent with patient was 40 minutes;  with greater than 75 percent of that time spent in face to face counseling regarding patient's symptoms,  and coordination of care and follow up.  Disclaimer:This dictation was prepared with Dragon/digital dictation along with [employer]. Any transcriptional errors that result from this process are unintentional.  Drue Second, NP 03/06/2016

## 2016-03-09 ENCOUNTER — Telehealth: Payer: Self-pay | Admitting: *Deleted

## 2016-03-09 ENCOUNTER — Other Ambulatory Visit: Payer: Self-pay | Admitting: *Deleted

## 2016-03-09 DIAGNOSIS — J069 Acute upper respiratory infection, unspecified: Secondary | ICD-10-CM

## 2016-03-09 NOTE — Telephone Encounter (Signed)
TCt to patient in follow up to [facility]  Visit on 03/06/16. Pt unable to speak d/t 'laryngitis'.  Spoke with his wife. She states he now has laryngitis and a sore throat and increased chest congestion, though cough is somewhat better. Wife states his appetite is down but is trying to drink ensure protein drinks etc. Advised wife that he should be seen in [facility] again this week and she and pt are agreeable. Urgent schesduling message sent for [facility] appt tomorrow with labs (pt neutropenic on 03/05/16 with ANC of 1.0)

## 2016-03-09 NOTE — Progress Notes (Signed)
  Radiation Oncology         [phone] ________________________________  Name: Mark Howell MRN: [id number]  Date: 02/28/2016  DOB: [birth year]  End of Treatment Note  Diagnosis:  Stage IIA (T1a, N1, M0) non-small cell lung cancer, adenocarcinoma, presented with right hilar mass and hilar lymphadenopathy      Indication for treatment:  Curative with a definitive course of radiation along with radiosensitizing chemotherapy and possible surgical resection  Radiation treatment dates:   01/25/16 - 02/28/16  Site/dose:   Right lung: 45 Gy in 25 fractions  Beams/energy:   3D // [employer]  Narrative: The patient tolerated radiation treatment relatively well. Towards the end of treatment, the patient developed a burning sensation in his right chest. The patient was provided with Carafate by Dr. Julien Nordmann. He had a productive cough, denied shortness of breath, and reported fatigue in the evenings. The patient did not have his 5th cycle of chemotherapy on 02/21/16 due to thrombocytopenia.  Plan: The patient has completed radiation treatment. The patient will return to radiation oncology clinic for routine followup in one month. I advised them to call or return sooner if they have any questions or concerns related to their recovery or treatment.  -----------------------------------  Blair Promise, PhD, MD  This document serves as a record of services personally performed by Mark Pray, MD. It was created on his behalf by Darcus Austin, a trained medical scribe. The creation of this record is based on the scribe's personal observations and the provider's statements to them. This document has been checked and approved by the attending provider.

## 2016-03-10 ENCOUNTER — Ambulatory Visit (HOSPITAL_BASED_OUTPATIENT_CLINIC_OR_DEPARTMENT_OTHER): Payer: Medicare Other | Admitting: Nurse Practitioner

## 2016-03-10 ENCOUNTER — Other Ambulatory Visit (HOSPITAL_BASED_OUTPATIENT_CLINIC_OR_DEPARTMENT_OTHER): Payer: Medicare Other

## 2016-03-10 ENCOUNTER — Telehealth: Payer: Self-pay | Admitting: *Deleted

## 2016-03-10 VITALS — BP 124/83 | HR 112 | Temp 98.3°F | Resp 18 | Ht 72.0 in | Wt 171.8 lb

## 2016-03-10 DIAGNOSIS — C3491 Malignant neoplasm of unspecified part of right bronchus or lung: Secondary | ICD-10-CM | POA: Diagnosis not present

## 2016-03-10 DIAGNOSIS — J069 Acute upper respiratory infection, unspecified: Secondary | ICD-10-CM

## 2016-03-10 DIAGNOSIS — J700 Acute pulmonary manifestations due to radiation: Secondary | ICD-10-CM | POA: Diagnosis not present

## 2016-03-10 LAB — COMPREHENSIVE METABOLIC PANEL
ALBUMIN: 3.7 g/dL (ref 3.5–5.0)
ALK PHOS: 84 U/L (ref 40–150)
ALT: 19 U/L (ref 0–55)
ANION GAP: 13 meq/L — AB (ref 3–11)
AST: 22 U/L (ref 5–34)
BILIRUBIN TOTAL: 0.64 mg/dL (ref 0.20–1.20)
BUN: 21.1 mg/dL (ref 7.0–26.0)
CALCIUM: 9.7 mg/dL (ref 8.4–10.4)
CO2: 20 mEq/L — ABNORMAL LOW (ref 22–29)
Chloride: 103 mEq/L (ref 98–109)
Creatinine: 1.3 mg/dL (ref 0.7–1.3)
EGFR: 53 mL/min/{1.73_m2} — AB (ref >90)
GLUCOSE: 213 mg/dL — AB (ref 70–140)
POTASSIUM: 4 meq/L (ref 3.5–5.1)
SODIUM: 136 meq/L (ref 136–145)
TOTAL PROTEIN: 8.2 g/dL (ref 6.4–8.3)

## 2016-03-10 LAB — CBC WITH DIFFERENTIAL/PLATELET
BASO%: 0.2 % (ref 0.0–2.0)
BASOS ABS: 0 10*3/uL (ref 0.0–0.1)
EOS ABS: 0 10*3/uL (ref 0.0–0.5)
EOS%: 0.5 % (ref 0.0–7.0)
HCT: 39 % (ref 38.4–49.9)
HEMOGLOBIN: 13.5 g/dL (ref 13.0–17.1)
LYMPH%: 7.7 % — ABNORMAL LOW (ref 14.0–49.0)
MCH: 31.6 pg (ref 27.2–33.4)
MCHC: 34.7 g/dL (ref 32.0–36.0)
MCV: 91.1 fL (ref 79.3–98.0)
MONO#: 1.4 10*3/uL — AB (ref 0.1–0.9)
MONO%: 16.7 % — AB (ref 0.0–14.0)
NEUT#: 6.4 10*3/uL (ref 1.5–6.5)
NEUT%: 74.9 % (ref 39.0–75.0)
PLATELETS: 204 10*3/uL (ref 140–400)
RBC: 4.28 10*6/uL (ref 4.20–5.82)
RDW: 16.9 % — AB (ref 11.0–14.6)
WBC: 8.6 10*3/uL (ref 4.0–10.3)
lymph#: 0.7 10*3/uL — ABNORMAL LOW (ref 0.9–3.3)

## 2016-03-10 LAB — CULTURE, BLOOD (ROUTINE X 2)
CULTURE: NO GROWTH
CULTURE: NO GROWTH

## 2016-03-10 MED ORDER — LEVOFLOXACIN 500 MG PO TABS: 500.0000 mg | ORAL_TABLET | Freq: Every day | ORAL | 0 refills | Status: DC

## 2016-03-10 NOTE — Telephone Encounter (Signed)
TCT patient's home. Spoke with pt's wife and advised of normal CMET results. Spoke with patient's wife and advised of normal results.  They have picked up new prescription for levaquin and he has started it already. No other questions or concerns voiced.

## 2016-03-13 ENCOUNTER — Encounter: Payer: Self-pay | Admitting: Nurse Practitioner

## 2016-03-13 ENCOUNTER — Telehealth: Payer: Self-pay

## 2016-03-13 NOTE — Assessment & Plan Note (Signed)
Patient states that he has been experiencing increased, nonproductive cough for the past week or so.  He states that the cough keeps him awake; and he has become increasingly short of breath as well.  He denies any recent fevers or chills.  Patient received his last/final cycle 4.  Carboplatin/Taxol chemotherapy on 02/14/2016.  He received his last radiation treatment on 02/28/2016.  Exam today reveals some trace wheezes to his bilateral lower lung fields; and a chronic, dry cough.  Patient also appears mildly short of breath as well; even though, O2 sat is 98% on room air.  Patient was afebrile today as well.  Patient notes that he presented to the emergency department just yesterday for further evaluation; and chest x-ray that time revealed no acute findings.  Also, patient had both blood cultures 2 and urine culture obtained and pending results.  Labs obtained yesterday revealed WBC was 2.3, ANC was 1.0, hemoglobin was 13.9, and platelet count was 83.  Patient was given an LP drawn nebulizer treatment while at the cancer center; which appeared to help the patient breathe much easier.  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  Reviewed all findings with Dr. 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  Patient will receive a prescription for Medrol Dosepak to see if this helps.  Recommended that patient continue with over-the-counter cough medications and the [employer] he already has at home.  Advised both patient and his wife that would need to consider longer-term steroids if the Medrol Dosepak is effective.  Also, advised patient to call/return or go directly to the emergency department for any worsening symptoms whatsoever. ___________________________________________  Update:  Patient presents back to the [facility] today for follow-up.  He states that he  has one day left of his Medrol Dosepak was prescribed earlier this week for probable radiation-induced pneumonitis.  He states that he has since developed laryngitis and a sore throat as well.  He states his cough has improved, however.  Patient was afebrile today on exam.  Breath sounds bilaterally were much more clear; the patient was noted have laryngitis today.  He was in no acute respiratory distress.  Patient was advised to continue taking the Medrol Dosepak until it has been completed.  Also, will prescribe Levaquin antibiotics for the patient as well for any infectious component of patient's illness.  He was advised to go directly to the emergency department of the weekend for any worsening symptoms whatsoever.  Also, the plan is to call the patient back on Monday, 03/13/2016 to check in with him as well.

## 2016-03-13 NOTE — Progress Notes (Signed)
SYMPTOM MANAGEMENT CLINIC    Chief Complaint: Cough  HPI:  Mark Howell 75 y.o. male diagnosed with lung cancer.  Recently completed carbo/Taxol chemotherapy regimen and radiation treatments.  Currently undergoing observation only.   Oncology History   Patient presented with abdominal pain and weight loss.  Work up showed incidental finding of lung mass.        Lung mass (Resolved)   12/03/2015 Imaging    CT Abd/Pelvis      12/03/2015 Imaging    CXR IMPRESSION: Interstitial changes in the left lung which are nonspecific and could be acute or chronic.      12/17/2015 Imaging    CT Chest IMPRESSION: Bilateral pulmonary nodules with evidence of right hilar adenopathy      12/27/2015 Imaging    PET IMPRESSION: Hypermetabolic right hilar mass worrisome for central bronchogenic carcinoma or metastatic disease to right hilar lymph nodes.       12/30/2015 Initial Diagnosis    Lung mass     12/31/2015 Surgery    Fiberoptic bronchoscopy under general anesthesia. Endoscopic bronchial ultrasound with transbronchial biopsies of 10R lymph nodes.      01/04/2016 Pathology Results    FINE NEEDLE ASPIRATION,ENDOSCOPIC (C) EBUS 10R # 3 (SPECIMEN 3 OF 3,COLLECTED ON 12/31/15) MALIGNANT CELLS CONSISTENT WITH NON-SMALL CELL CARCINOMA.        Review of Systems  Respiratory:       Laryngitis  All other systems reviewed and are negative.   Past Medical History:  Diagnosis Date  . Arthritis   . Complication of anesthesia    problems voiding post op  . Full dentures   . GERD (gastroesophageal reflux disease)   . HOH (hard of hearing)   . HTN (hypertension)   . Hyperlipidemia   . Lung mass 12/30/2015  . Psoriatic arthritis ([place])   . Snores   . Wears glasses     Past Surgical History:  Procedure Laterality Date  . COLONOSCOPY    . HEMORRHOID SURGERY N/A 10/01/2013   Procedure: EXAM UNDER ANESTHESIA  AND EXCISIONAL HEMORRHOIDECTOMY WITH HEMORRHOID BANDING X 2;  Surgeon: Gayland Curry, MD;  Location: [facility];  Service: General;  Laterality: N/A;  . 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    is allergic to hydrocodone and oxycodone.    Medication List       Accurate as of 03/10/16 11:59 PM. Always use your most recent med list.          acetaminophen 500 MG tablet Commonly known as:  TYLENOL Take 500-1,000 mg by mouth 2 (two) times daily as needed for mild pain.   albuterol 108 (90 Base) MCG/ACT inhaler Commonly known as:  PROVENTIL HFA;VENTOLIN HFA Inhale 1-2 puffs into the lungs every 6 (six) hours as needed for wheezing or shortness of breath (Wheeze).   aspirin EC 81 MG tablet Take 81 mg by mouth at bedtime.   aspirin-sod bicarb-citric acid 325 MG Tbef tablet Commonly known as:  ALKA-SELTZER Take 325 mg by mouth every 6 (six) hours as needed (indigestion).   benzonatate 100 MG capsule Commonly known as:  TESSALON Take 100 mg by mouth 3 (three) times daily as needed for cough.   clotrimazole-betamethasone cream Commonly known as:  LOTRISONE Apply 1 application topically 2 (two) times daily.   dextromethorphan 30 MG/5ML liquid Commonly known as:  DELSYM Take 30 mg by mouth 2 (two) times daily as needed for cough.   diphenhydramine-acetaminophen 25-500 MG Tabs tablet Commonly known as:  TYLENOL PM Take 1 tablet by mouth at bedtime. For sleep   ENBREL SURECLICK 50 MG/ML  injection Generic drug:  etanercept Inject 50 mg into the skin once a week. Tuesday   guaiFENesin-dextromethorphan 100-10 MG/5ML syrup Commonly known as:  ROBITUSSIN DM Take 5 mLs by mouth every 4 (four) hours as needed for cough.   levofloxacin 500 MG tablet Commonly known as:  LEVAQUIN Take 1 tablet (500 mg total) by mouth daily.   methylPREDNISolone 4 MG Tbpk tablet Commonly known as:  MEDROL DOSEPAK Medrol dose pak: take as directed.   multivitamin with minerals Tabs tablet Take 1 tablet by mouth daily.   pantoprazole 40 MG tablet Commonly known as:  PROTONIX Take 40 mg by mouth daily.   pravastatin 40 MG tablet Commonly known as:  PRAVACHOL Take 40 mg by mouth at bedtime.   predniSONE 10 MG tablet Commonly known as:  DELTASONE Take 5 mg by mouth every other day.   prochlorperazine 10 MG tablet Commonly known as:  COMPAZINE Take 1 tablet (10 mg total) by mouth every 6 (six) hours as needed for nausea or vomiting.   SONAFINE Apply 1 application topically 2 (two) times daily.   sucralfate 1 g tablet Commonly known as:  CARAFATE Take 1 tablet (1 g total) by mouth 4 (four) times daily -  with meals and at bedtime.   tamsulosin 0.4 MG Caps capsule Commonly known as:  FLOMAX TAKE (1) CAPSULE DAILY, START 4 DAYS BEFORE PROCEDURE        PHYSICAL EXAMINATION  Oncology Vitals 03/10/2016 03/06/2016  Height 183 cm -  Weight 77.928 kg 80.967 kg  Weight (lbs) 171 lbs 13 oz 178 lbs 8 oz  BMI (kg/m2) 23.3 kg/m2 23.55 kg/m2  Temp 98.3 98.6  Pulse 112 110  Resp 18 20  SpO2 100 100  BSA (m2) 1.99 m2 2.04 m2   BP Readings from Last 2 Encounters:  03/10/16 124/83  03/06/16 (!) 164/98    Physical Exam  Constitutional: He is oriented to person, place, and time. He appears unhealthy.  HENT:  Head: Normocephalic and atraumatic.  Mouth/Throat: Oropharynx is clear and moist.  Eyes: Conjunctivae and EOM are normal. Pupils are equal, round, and reactive to light. Right  eye exhibits no discharge. Left eye exhibits no discharge. No scleral icterus.  Neck: Normal range of motion. Neck supple. No JVD present. No tracheal deviation present. No thyromegaly present.  Cardiovascular: Normal rate, regular rhythm, normal heart sounds and intact distal pulses.   Pulmonary/Chest: Effort normal. No respiratory distress. He has no wheezes. He has no rales. He exhibits no tenderness.  Laryngitis  Abdominal: Soft. Bowel sounds are normal. He exhibits no distension and no mass. There is no tenderness. There is no rebound and no guarding.  Musculoskeletal: Normal range of motion. He exhibits no edema, tenderness or deformity.  Lymphadenopathy:    He has no cervical adenopathy.  Neurological: He is alert and oriented to person, place, and time. Gait  normal.  Skin: Skin is warm and dry. No rash noted. No erythema. No pallor.  Psychiatric: Affect normal.  Nursing note and vitals reviewed.   LABORATORY DATA:. Appointment on 03/10/2016  Component Date Value Ref Range Status  . WBC 03/10/2016 8.6  4.0 - 10.3 10e3/uL Final  . NEUT# 03/10/2016 6.4  1.5 - 6.5 10e3/uL Final  . HGB 03/10/2016 13.5  13.0 - 17.1 g/dL Final  . HCT 03/10/2016 39.0  38.4 - 49.9 % Final  . Platelets 03/10/2016 204  140 - 400 10e3/uL Final  . MCV 03/10/2016 91.1  79.3 - 98.0 fL Final  . MCH 03/10/2016 31.6  27.2 - 33.4 pg Final  . MCHC 03/10/2016 34.7  32.0 - 36.0 g/dL Final  . RBC 03/10/2016 4.28  4.20 - 5.82 10e6/uL Final  . RDW 03/10/2016 16.9* 11.0 - 14.6 % Final  . lymph# 03/10/2016 0.7* 0.9 - 3.3 10e3/uL Final  . MONO# 03/10/2016 1.4* 0.1 - 0.9 10e3/uL Final  . Eosinophils Absolute 03/10/2016 0.0  0.0 - 0.5 10e3/uL Final  . Basophils Absolute 03/10/2016 0.0  0.0 - 0.1 10e3/uL Final  . NEUT% 03/10/2016 74.9  39.0 - 75.0 % Final  . LYMPH% 03/10/2016 7.7* 14.0 - 49.0 % Final  . MONO% 03/10/2016 16.7* 0.0 - 14.0 % Final  . EOS% 03/10/2016 0.5  0.0 - 7.0 % Final  . BASO% 03/10/2016 0.2  0.0 - 2.0  % Final  . Sodium 03/10/2016 136  136 - 145 mEq/L Final  . Potassium 03/10/2016 4.0  3.5 - 5.1 mEq/L Final  . Chloride 03/10/2016 103  98 - 109 mEq/L Final  . CO2 03/10/2016 20* 22 - 29 mEq/L Final  . Glucose 03/10/2016 213* 70 - 140 mg/dl Final  . BUN 03/10/2016 21.1  7.0 - 26.0 mg/dL Final  . Creatinine 03/10/2016 1.3  0.7 - 1.3 mg/dL Final  . Total Bilirubin 03/10/2016 0.64  0.20 - 1.20 mg/dL Final  . Alkaline Phosphatase 03/10/2016 84  40 - 150 U/L Final  . AST 03/10/2016 22  5 - 34 U/L Final  . ALT 03/10/2016 19  0 - 55 U/L Final  . Total Protein 03/10/2016 8.2  6.4 - 8.3 g/dL Final  . Albumin 03/10/2016 3.7  3.5 - 5.0 g/dL Final  . Calcium 03/10/2016 9.7  8.4 - 10.4 mg/dL Final  . Anion Gap 03/10/2016 13* 3 - 11 mEq/L Final  . EGFR 03/10/2016 53* >90 ml/min/1.73 m2 Final    RADIOGRAPHIC STUDIES: No results found.  ASSESSMENT/PLAN:    Radiation pneumonitis [facility]) Patient states that he has been experiencing increased, nonproductive cough for the past week or so.  He states that the cough keeps him awake; and he has become increasingly short of breath as well.  He denies any recent fevers or chills.  Patient received his last/final cycle 4.  Carboplatin/Taxol chemotherapy on 02/14/2016.  He received his last radiation treatment on 02/28/2016.  Exam today reveals some trace wheezes to his bilateral lower lung fields; and a chronic, dry cough.  Patient also appears mildly short of breath as well; even though, O2 sat is 98% on room air.  Patient was afebrile today as well.  Patient notes that he presented to the emergency department just yesterday for further evaluation; and chest x-ray that time revealed no acute findings.  Also, patient had both blood cultures 2 and urine culture obtained and pending results.  Labs obtained yesterday revealed WBC was 2.3, ANC was 1.0, hemoglobin was 13.9, and platelet  count was 83.  Patient was given an LP drawn nebulizer treatment while at the  cancer center; which appeared to help the patient breathe much easier.  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  Reviewed all findings with Dr. 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  Patient will receive a prescription for Medrol Dosepak to see if this helps.  Recommended that patient continue with over-the-counter cough medications and the [employer] he already has at home.  Advised both patient and his wife that would need to consider longer-term steroids if the Medrol Dosepak is effective.  Also, advised patient to call/return or go directly to the emergency department for any worsening symptoms whatsoever. ___________________________________________  Update:  Patient presents back to [facility] today for follow-up.  He states that he has one day left of his Medrol Dosepak was prescribed earlier this week for probable radiation-induced pneumonitis.  He states that he has since developed laryngitis and a sore throat as well.  He states his cough has improved, however.  Patient was afebrile today on exam.  Breath sounds bilaterally were much more clear; the patient was noted have laryngitis today.  He was in no acute respiratory distress.  Patient was advised to continue taking the Medrol Dosepak until it has been completed.  Also, will prescribe Levaquin antibiotics for the patient as well for any infectious component of patient's illness.  He was advised to go directly to the emergency department of the weekend for any worsening symptoms whatsoever.  Also, the plan is to call the patient back on Monday, 03/13/2016 to check in with him as well.  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  He completed his last radiation treatment on 02/28/2016.  He is undergoing  observation only at this present time.  See notes for details of today's visit.  Patient is scheduled to return on 04/06/2016 for labs and a follow-up visit.  He is due for restaging scan prior to that visit on 04/06/2016.   Patient stated understanding of all instructions; and was in agreement with this plan of care. The patient knows to call the clinic with any problems, questions or concerns.   Total time spent with patient was 25 minutes;  with greater than 75 percent of that time spent in face to face counseling regarding patient's symptoms,  and coordination of care and follow up.  Disclaimer:This dictation was prepared with Dragon/digital dictation along with [employer]. Any transcriptional errors that result from this process are unintentional.  Drue Second, NP 03/13/2016

## 2016-03-13 NOTE — Telephone Encounter (Signed)
Pt states he is feeling "right much better". Instructed him to call if he worsens.

## 2016-03-13 NOTE — Assessment & Plan Note (Signed)
Patient received his final cycle 4, Carbo, pleasant/Taxol chemotherapy on 02/14/2016.  He completed his last radiation treatment on 02/28/2016.  He is undergoing observation only at this present time.  See notes for details of today's visit.  Patient is scheduled to return on 04/06/2016 for labs and a follow-up visit.  He is due for restaging scan prior to that visit on 04/06/2016.

## 2016-03-16 ENCOUNTER — Telehealth: Payer: Self-pay | Admitting: *Deleted

## 2016-03-16 NOTE — Telephone Encounter (Signed)
Notified pt CT scan 12/4 at 1130am. Only liquids 4 hours prior to test. Pt verbalized understanding.

## 2016-03-20 ENCOUNTER — Other Ambulatory Visit: Payer: Self-pay | Admitting: Nurse Practitioner

## 2016-03-20 ENCOUNTER — Telehealth: Payer: Self-pay | Admitting: *Deleted

## 2016-03-20 DIAGNOSIS — J700 Acute pulmonary manifestations due to radiation: Secondary | ICD-10-CM

## 2016-03-20 DIAGNOSIS — C3491 Malignant neoplasm of unspecified part of right bronchus or lung: Secondary | ICD-10-CM

## 2016-03-20 MED ORDER — METHYLPREDNISOLONE 4 MG PO TBPK: ORAL_TABLET | ORAL | 0 refills | Status: DC

## 2016-03-20 NOTE — Telephone Encounter (Signed)
Received voicemail from patient's wife stating that Mr. Furry still has cough even though antibiotics have been completed as has steroid dosepak. Discussed with Selena Lesser, NP . She, in turn, discussed with Dr. Julien Nordmann. Per Dr. Jodene Nam to restart medrol dospak (this has been called in to their pharmacy in [place])  Then patient is scheduled to have chest CT done on 04/03/16. Reminded patient and wife that if symptoms worsen to call back to [place]; or if acutely SOB to go immediately to ED. Wife voiced understanding.

## 2016-04-03 ENCOUNTER — Encounter (HOSPITAL_COMMUNITY): Payer: Self-pay

## 2016-04-03 ENCOUNTER — Ambulatory Visit (HOSPITAL_COMMUNITY)
Admission: RE | Admit: 2016-04-03 | Discharge: 2016-04-03 | Disposition: A | Discharge status: Home / Self Care | Payer: Medicare Other | Source: Ambulatory Visit | Attending: Nurse Practitioner | Admitting: Nurse Practitioner

## 2016-04-03 DIAGNOSIS — I2510 Atherosclerotic heart disease of native coronary artery without angina pectoris: Secondary | ICD-10-CM | POA: Diagnosis not present

## 2016-04-03 DIAGNOSIS — J984 Other disorders of lung: Secondary | ICD-10-CM | POA: Insufficient documentation

## 2016-04-03 DIAGNOSIS — N200 Calculus of kidney: Secondary | ICD-10-CM | POA: Diagnosis not present

## 2016-04-03 DIAGNOSIS — Y842 Radiological procedure and radiotherapy as the cause of abnormal reaction of the patient, or of later complication, without mention of misadventure at the time of the procedure: Secondary | ICD-10-CM | POA: Insufficient documentation

## 2016-04-03 DIAGNOSIS — I700 Atherosclerosis of aorta: Secondary | ICD-10-CM | POA: Diagnosis not present

## 2016-04-03 DIAGNOSIS — C3491 Malignant neoplasm of unspecified part of right bronchus or lung: Secondary | ICD-10-CM | POA: Insufficient documentation

## 2016-04-03 MED ORDER — IOPAMIDOL (ISOVUE-300) INJECTION 61%
75.0000 mL | Freq: Once | INTRAVENOUS | Status: AC | PRN
  Administered 2016-04-03: 75 mL via INTRAVENOUS

## 2016-04-05 ENCOUNTER — Encounter: Payer: Self-pay | Admitting: Oncology

## 2016-04-06 ENCOUNTER — Encounter: Payer: Self-pay | Admitting: Internal Medicine

## 2016-04-06 ENCOUNTER — Ambulatory Visit: Admission: RE | Admit: 2016-04-06 | Payer: Medicare Other | Source: Ambulatory Visit | Admitting: Radiation Oncology

## 2016-04-06 ENCOUNTER — Ambulatory Visit (HOSPITAL_BASED_OUTPATIENT_CLINIC_OR_DEPARTMENT_OTHER): Payer: Medicare Other | Admitting: Internal Medicine

## 2016-04-06 ENCOUNTER — Other Ambulatory Visit (HOSPITAL_BASED_OUTPATIENT_CLINIC_OR_DEPARTMENT_OTHER): Payer: Medicare Other

## 2016-04-06 VITALS — BP 146/76 | HR 90 | Temp 97.5°F | Resp 20 | Ht 72.0 in | Wt 177.0 lb

## 2016-04-06 DIAGNOSIS — D6481 Anemia due to antineoplastic chemotherapy: Secondary | ICD-10-CM | POA: Insufficient documentation

## 2016-04-06 DIAGNOSIS — C3491 Malignant neoplasm of unspecified part of right bronchus or lung: Secondary | ICD-10-CM

## 2016-04-06 DIAGNOSIS — R1310 Dysphagia, unspecified: Secondary | ICD-10-CM

## 2016-04-06 DIAGNOSIS — I10 Essential (primary) hypertension: Secondary | ICD-10-CM

## 2016-04-06 DIAGNOSIS — C3401 Malignant neoplasm of right main bronchus: Secondary | ICD-10-CM | POA: Diagnosis not present

## 2016-04-06 DIAGNOSIS — M1990 Unspecified osteoarthritis, unspecified site: Secondary | ICD-10-CM

## 2016-04-06 DIAGNOSIS — T451X5A Adverse effect of antineoplastic and immunosuppressive drugs, initial encounter: Secondary | ICD-10-CM

## 2016-04-06 DIAGNOSIS — Z5111 Encounter for antineoplastic chemotherapy: Secondary | ICD-10-CM

## 2016-04-06 DIAGNOSIS — K208 Other esophagitis: Secondary | ICD-10-CM

## 2016-04-06 HISTORY — DX: Dysphagia, unspecified: R13.10

## 2016-04-06 LAB — CBC WITH DIFFERENTIAL/PLATELET
BASO%: 0.7 % (ref 0.0–2.0)
Basophils Absolute: 0.1 10*3/uL (ref 0.0–0.1)
EOS%: 5.6 % (ref 0.0–7.0)
Eosinophils Absolute: 0.5 10*3/uL (ref 0.0–0.5)
HCT: 37.2 % — ABNORMAL LOW (ref 38.4–49.9)
HGB: 12.5 g/dL — ABNORMAL LOW (ref 13.0–17.1)
LYMPH%: 9.9 % — AB (ref 14.0–49.0)
MCH: 31.4 pg (ref 27.2–33.4)
MCHC: 33.6 g/dL (ref 32.0–36.0)
MCV: 93.4 fL (ref 79.3–98.0)
MONO#: 0.8 10*3/uL (ref 0.1–0.9)
MONO%: 10.4 % (ref 0.0–14.0)
NEUT#: 6 10*3/uL (ref 1.5–6.5)
NEUT%: 73.4 % (ref 39.0–75.0)
PLATELETS: 170 10*3/uL (ref 140–400)
RBC: 3.98 10*6/uL — AB (ref 4.20–5.82)
RDW: 17.3 % — ABNORMAL HIGH (ref 11.0–14.6)
WBC: 8.1 10*3/uL (ref 4.0–10.3)
lymph#: 0.8 10*3/uL — ABNORMAL LOW (ref 0.9–3.3)

## 2016-04-06 LAB — COMPREHENSIVE METABOLIC PANEL
ALT: 13 U/L (ref 0–55)
ANION GAP: 12 meq/L — AB (ref 3–11)
AST: 22 U/L (ref 5–34)
Albumin: 3.6 g/dL (ref 3.5–5.0)
Alkaline Phosphatase: 94 U/L (ref 40–150)
BUN: 13.8 mg/dL (ref 7.0–26.0)
CHLORIDE: 106 meq/L (ref 98–109)
CO2: 22 meq/L (ref 22–29)
Calcium: 9.5 mg/dL (ref 8.4–10.4)
Creatinine: 1.2 mg/dL (ref 0.7–1.3)
EGFR: 57 mL/min/{1.73_m2} — AB (ref >90)
Glucose: 112 mg/dl (ref 70–140)
POTASSIUM: 4.7 meq/L (ref 3.5–5.1)
Sodium: 140 mEq/L (ref 136–145)
Total Bilirubin: 0.42 mg/dL (ref 0.20–1.20)
Total Protein: 7.8 g/dL (ref 6.4–8.3)

## 2016-04-06 NOTE — Progress Notes (Signed)
[facility] [phone]   [phone] [place], MD [place] [address]  DIAGNOSIS: Stage IIA (T1a, N1, M0) non-small cell lung cancer, adenocarcinoma presented with a right hilar mass as well as right hilar adenopathy diagnosed in September 2017.  PRIOR THERAPY: Concurrent chemoradiation with weekly carboplatin for AUC of 2 and paclitaxel 45 MG/M2 first dose was given 01/24/2016. Status post 4 cycles of chemotherapy with partial response.  CURRENT THERAPY: None.  INTERVAL HISTORY: Mark Howell 75 y.o. male returns to the clinic today for follow-up visit accompanied by his wife and daughter. The patient completed a course of concurrent chemoradiation with weekly carboplatin and paclitaxel. His treatment is complicated with radiation induced esophagitis and the patient continues to have some difficulty with swallowing. He has Carafate at home. He does not use it regularly. He also has mild cough and shortness of breath with exertion. He denied having any chest pain or hemoptysis. He gained around 5 pounds since his last visit. He denied having any fever or chills. He has no nausea or vomiting. He had repeat CT scan of the chest performed recently and he is here for evaluation and discussion of his scan results. He will also take prednisone for arthritis pain.  MEDICAL HISTORY: Past Medical History:  Diagnosis Date  . Arthritis   . Complication of anesthesia    problems voiding post op  . Full dentures   . GERD (gastroesophageal reflux disease)   . History of radiation therapy 01/25/16-02/28/16   right lung 45 Gy  . HOH (hard of hearing)   . HTN (hypertension)   . Hyperlipidemia   . Lung mass 12/30/2015  . Psoriatic arthritis ([place])   . Snores   . Wears glasses     ALLERGIES:  is allergic to hydrocodone and oxycodone.  MEDICATIONS:  Current Outpatient Prescriptions  Medication Sig  Dispense Refill  . acetaminophen (TYLENOL) 500 MG tablet Take 500-1,000 mg by mouth 2 (two) times daily as needed for mild pain.    Marland Kitchen albuterol (PROVENTIL HFA;VENTOLIN HFA) 108 (90 Base) MCG/ACT inhaler Inhale 1-2 puffs into the lungs every 6 (six) hours as needed for wheezing or shortness of breath (Wheeze). 1 Inhaler 2  . aspirin EC 81 MG tablet Take 81 mg by mouth at bedtime.     Marland Kitchen aspirin-sod bicarb-citric acid (ALKA-SELTZER) 325 MG TBEF tablet Take 325 mg by mouth every 6 (six) hours as needed (indigestion).    . benzonatate (TESSALON) 100 MG capsule Take 100 mg by mouth 3 (three) times daily as needed for cough.    . clotrimazole-betamethasone (LOTRISONE) cream Apply 1 application topically 2 (two) times daily.    Marland Kitchen dextromethorphan (DELSYM) 30 MG/5ML liquid Take 30 mg by mouth 2 (two) times daily as needed for cough.    . diphenhydramine-acetaminophen (TYLENOL PM) 25-500 MG TABS Take 1 tablet by mouth at bedtime. For sleep    . ENBREL SURECLICK 50 MG/ML injection Inject 50 mg into the skin once a week. Tuesday    . guaiFENesin-dextromethorphan (ROBITUSSIN DM) 100-10 MG/5ML syrup Take 5 mLs by mouth every 4 (four) hours as needed for cough. 118 mL 0  . levofloxacin (LEVAQUIN) 500 MG tablet Take 1 tablet (500 mg total) by mouth daily. 10 tablet 0  . methylPREDNISolone (MEDROL DOSEPAK) 4 MG TBPK tablet Medrol dose pak: take as directed. 21 tablet 0  . Multiple Vitamin (MULITIVITAMIN WITH MINERALS) TABS Take 1  tablet by mouth daily.    . pantoprazole (PROTONIX) 40 MG tablet Take 40 mg by mouth daily.     . pravastatin (PRAVACHOL) 40 MG tablet Take 40 mg by mouth at bedtime.     . predniSONE (DELTASONE) 10 MG tablet Take 5 mg by mouth every other day.     . prochlorperazine (COMPAZINE) 10 MG tablet Take 1 tablet (10 mg total) by mouth every 6 (six) hours as needed for nausea or vomiting. (Patient not taking: Reported on 03/05/2016) 30 tablet 0  . sucralfate (CARAFATE) 1 g tablet Take 1 tablet (1 g  total) by mouth 4 (four) times daily -  with meals and at bedtime. 120 tablet 0  . tamsulosin (FLOMAX) 0.4 MG CAPS capsule TAKE (1) CAPSULE DAILY, START 4 DAYS BEFORE PROCEDURE (Patient taking differently: take 1 capsule by mouth every morning) 30 capsule 2  . Wound Dressings (SONAFINE) Apply 1 application topically 2 (two) times daily.     No current facility-administered medications for this visit.     SURGICAL HISTORY:  Past Surgical History:  Procedure Laterality Date  . COLONOSCOPY    . HEMORRHOID SURGERY N/A 10/01/2013   Procedure: EXAM UNDER ANESTHESIA  AND EXCISIONAL HEMORRHOIDECTOMY WITH HEMORRHOID BANDING X 2;  Surgeon: Gayland Curry, MD;  Location: [facility];  Service: General;  Laterality: N/A;  . INGUINAL HERNIA REPAIR  01/04/2012   Procedure: LAPAROSCOPIC INGUINAL HERNIA;  Surgeon: Gayland Curry, MD,FACS;  Location: [facility];  Service: General;  Laterality: Right;  Marland Kitchen VIDEO BRONCHOSCOPY WITH ENDOBRONCHIAL ULTRASOUND N/A 12/31/2015   Procedure: VIDEO BRONCHOSCOPY WITH ENDOBRONCHIAL ULTRASOUND transbronchial biopsy of node 10 R lymph node;  Surgeon: Grace Isaac, MD;  Location: [facility];  Service: Thoracic;  Laterality: N/A;    REVIEW OF SYSTEMS:  Constitutional: negative Eyes: negative Ears, nose, mouth, throat, and face: negative Respiratory: positive for cough and dyspnea on exertion Cardiovascular: negative Gastrointestinal: positive for odynophagia Genitourinary:negative for dysuria, frequency and hematuria Integument/breast: negative Hematologic/lymphatic: negative Musculoskeletal:positive for arthralgias Neurological: negative Behavioral/Psych: negative Endocrine: negative Allergic/Immunologic: negative   PHYSICAL EXAMINATION: General appearance: alert, cooperative and no distress Head: Normocephalic, without obvious abnormality, atraumatic Neck: no adenopathy, no JVD, supple, symmetrical, trachea midline and thyroid not enlarged, symmetric, no  tenderness/mass/nodules Lymph nodes: Cervical, supraclavicular, and axillary nodes normal. Resp: clear to auscultation bilaterally Back: symmetric, no curvature. ROM normal. No CVA tenderness. Cardio: regular rate and rhythm, S1, S2 normal, no murmur, click, rub or gallop GI: soft, non-tender; bowel sounds normal; no masses,  no organomegaly Extremities: extremities normal, atraumatic, no cyanosis or edema Neurologic: Alert and oriented X 3, normal strength and tone. Normal symmetric reflexes. Normal coordination and gait  ECOG PERFORMANCE STATUS: 1 - Symptomatic but completely ambulatory  Blood pressure (!) 146/76, pulse 90, temperature 97.5 F (36.4 C), temperature source Oral, resp. rate 20, height 6' (1.829 m), weight 177 lb (80.3 kg), SpO2 99 %.  LABORATORY DATA: Lab Results  Component Value Date   WBC 8.1 04/06/2016   HGB 12.5 (L) 04/06/2016   HCT 37.2 (L) 04/06/2016   MCV 93.4 04/06/2016   PLT 170 04/06/2016      Chemistry      Component Value Date/Time   NA 136 03/10/2016 1117   K 4.0 03/10/2016 1117   CL 99 (L) 03/05/2016 1344   CO2 20 (L) 03/10/2016 1117   BUN 21.1 03/10/2016 1117   CREATININE 1.3 03/10/2016 1117      Component Value Date/Time  CALCIUM 9.7 03/10/2016 1117   ALKPHOS 84 03/10/2016 1117   AST 22 03/10/2016 1117   ALT 19 03/10/2016 1117   BILITOT 0.64 03/10/2016 1117       RADIOGRAPHIC STUDIES: Ct Chest W Contrast  Result Date: 04/03/2016 CLINICAL DATA:  Lung cancer diagnosed in October. Chemotherapy and radiation therapy complete. Stage II adenocarcinoma. EXAM: CT CHEST WITH CONTRAST TECHNIQUE: Multidetector CT imaging of the chest was performed during intravenous contrast administration. CONTRAST:  60m ISOVUE-300 IOPAMIDOL (ISOVUE-300) INJECTION 61% COMPARISON:  Plain films 03/05/2016. CT 12/17/2015. PET 12/27/2015. FINDINGS: Cardiovascular: Advanced aortic and branch vessel atherosclerosis. Normal heart size, without pericardial effusion.  Multivessel coronary artery atherosclerosis. No central pulmonary embolism, on this non-dedicated study. Mediastinum/Nodes: No supraclavicular adenopathy. No mediastinal adenopathy. Central right hilar node measures 11 mm on image 66/series 2 versus 1.9 cm on 12/17/2015 CT (when remeasured). More lateral right hilar node/mass measures 1.2 x 2.1 cm on image 72/series 2. Compare 1.9 x 2.4 cm on the prior exam (when remeasured). Lungs/Pleura: No pleural fluid. Probable secretions in dependent trachea. Moderate centrilobular emphysema.Right perihilar radiation fibrosis, as evidenced by septal thickening and ground-glass opacity. Right middle lobe 6 mm nodule on image 83/ series 5, similar. Presumed scarring more laterally in the right middle lobe. 3 mm right lower lobe pulmonary nodule on image 101/series 5 is not significantly changed. There is also left upper and left lower lobe scarring. A focus of probable nodular scarring in the left lower lobe is unchanged at 10 mm on image 103/series 5. Upper Abdomen: Caudate and lateral segment left liver lobe prominence. Normal imaged portions of the spleen, stomach, pancreas, adrenal glands, left kidney. Upper pole right renal collecting system stone or stones. Abdominal and branch vessel aortic atherosclerosis. Mild ectasia of the celiac at 12 mm on image 162/ series 2, similar. Musculoskeletal: No acute osseous abnormality. IMPRESSION: 1. Response to therapy of right hilar adenopathy/mass. 2. Radiation change in the medial right lung. 3. No new sites of disease identified. 4. Similar pulmonary nodularity and areas of scarring. 5.  Coronary artery atherosclerosis. Aortic atherosclerosis. 6. Hepatic morphology for which mild cirrhosis cannot be excluded. Correlate with risk factors. 7. Right nephrolithiasis. Electronically Signed   By: KAbigail MiyamotoM.D.   On: 04/03/2016 15:00    ASSESSMENT AND PLAN: This is a very pleasant 75years old white male recently diagnosed  with: 1) stage II a non-small cell lung cancer status post short course of neoadjuvant concurrent chemoradiation with weekly carboplatin and paclitaxel. The patient tolerated his previous treatment well except for radiation induced esophagitis with odynophagia. He has a restaging CT scan of the chest performed recently. I personally and independently reviewed the scan images and discuss the results with the patient and his family. He has response to therapy in the right hilar adenopathy and mass. I had a lengthy discussion with the patient and his family today about his condition. I recommended for him to see Dr. GServando Snarefor consideration of surgical resection. I personally contacted Dr. GEverrett Coombeoffice to arrange for his appointment. If the patient is not a good surgical candidate, we will proceed with the last 2 weeks of concurrent chemoradiation. 2) radiation induced esophagitis and odynophagia.: I strongly recommended for the patient to continue treatment with Carafate and pain medication for now. 3) hypertension: This is most likely secondary to anxiety. We will continue to monitor him closely on upcoming visit. 4) chemotherapy-induced anemia: We will continue to monitor for now and consider the patient for PRBCs  transfusion if needed. 5) osteoarthritis: He will continue his current treatment with prednisone as prescribed by his primary care physician. The patient was advised to call immediately if he has any concerning symptoms in the interval. The patient voices understanding of current disease status and treatment options and is in agreement with the current care plan.  All questions were answered. The patient knows to call the clinic with any problems, questions or concerns. We can certainly see the patient much sooner if necessary.  Disclaimer: This note was dictated with voice recognition software. Similar sounding words can inadvertently be transcribed and may not be corrected upon  review.

## 2016-04-06 NOTE — Patient Instructions (Signed)
Steps to Quit Smoking Smoking tobacco can be bad for your health. It can also affect almost every organ in your body. Smoking puts you and people around you at risk for many serious long-lasting (chronic) diseases. Quitting smoking is hard, but it is one of the best things that you can do for your health. It is never too late to quit. What are the benefits of quitting smoking? When you quit smoking, you lower your risk for getting serious diseases and conditions. They can include:  Lung cancer or lung disease.  Heart disease.  Stroke.  Heart attack.  Not being able to have children (infertility).  Weak bones (osteoporosis) and broken bones (fractures). If you have coughing, wheezing, and shortness of breath, those symptoms may get better when you quit. You may also get sick less often. If you are pregnant, quitting smoking can help to lower your chances of having a baby of low birth weight. What can I do to help me quit smoking? Talk with your doctor about what can help you quit smoking. Some things you can do (strategies) include:  Quitting smoking totally, instead of slowly cutting back how much you smoke over a period of time.  Going to in-person counseling. You are more likely to quit if you go to many counseling sessions.  Using resources and support systems, such as:  Online chats with a counselor.  Phone quitlines.  Printed self-help materials.  Support groups or group counseling.  Text messaging programs.  Mobile phone apps or applications.  Taking medicines. Some of these medicines may have nicotine in them. If you are pregnant or breastfeeding, do not take any medicines to quit smoking unless your doctor says it is okay. Talk with your doctor about counseling or other things that can help you. Talk with your doctor about using more than one strategy at the same time, such as taking medicines while you are also going to in-person counseling. This can help make quitting  easier. What things can I do to make it easier to quit? Quitting smoking might feel very hard at first, but there is a lot that you can do to make it easier. Take these steps:  Talk to your family and friends. Ask them to support and encourage you.  Call phone quitlines, reach out to support groups, or work with a counselor.  Ask people who smoke to not smoke around you.  Avoid places that make you want (trigger) to smoke, such as:  Bars.  Parties.  Smoke-break areas at work.  Spend time with people who do not smoke.  Lower the stress in your life. Stress can make you want to smoke. Try these things to help your stress:  Getting regular exercise.  Deep-breathing exercises.  Yoga.  Meditating.  Doing a body scan. To do this, close your eyes, focus on one area of your body at a time from head to toe, and notice which parts of your body are tense. Try to relax the muscles in those areas.  Download or buy apps on your mobile phone or tablet that can help you stick to your quit plan. There are many free apps, such as QuitGuide from the CDC (Centers for Disease Control and Prevention). You can find more support from smokefree.gov and other websites. This information is not intended to replace advice given to you by your health care provider. Make sure you discuss any questions you have with your health care provider. Document Released: 02/11/2009 Document Revised: 12/14/2015 Document   Reviewed: 09/01/2014 [employer] Interactive Patient Education  2017 [employer]  

## 2016-04-10 ENCOUNTER — Ambulatory Visit (INDEPENDENT_AMBULATORY_CARE_PROVIDER_SITE_OTHER): Payer: Medicare Other | Admitting: Cardiothoracic Surgery

## 2016-04-10 ENCOUNTER — Encounter: Payer: Self-pay | Admitting: Cardiothoracic Surgery

## 2016-04-10 VITALS — BP 150/90 | HR 95 | Resp 20 | Ht 72.0 in | Wt 177.0 lb

## 2016-04-10 DIAGNOSIS — R918 Other nonspecific abnormal finding of lung field: Secondary | ICD-10-CM | POA: Diagnosis not present

## 2016-04-10 NOTE — Progress Notes (Signed)
[address]       [place] [address]             [phone]                    Mark Howell  Medical Record #[id number] Date of Birth: 09/09/1940  Referring: Curt Bears, MD Primary Care: Sherrie Mustache, MD  Chief Complaint:    Chief Complaint  Patient presents with  . Mediastinal Mass    Further discuss possible surgery     History of Present Illness:    Mark Howell 75 y.o. male is seen in the office  today for abnormal ct of chest and PET scan. Patient long term smoker more then 50 years. He has noted weight  loss and diarrhea . In September he was diagnosed with Adenocarcinoma of right lung, stage 2 ([place]) Clinical stage from 01/13/2016: Stage IIA (T1a, N1, M0). The mass was very centrally located at the time of diagnosis was felt that a right pneumonectomy with be required. Patient received his final cycle 4, Carbo, pleasant/Taxol chemotherapy on 02/14/2016.  He completed his last radiation treatment on 02/28/2016.  Follow-up CT scan has been performed.   The patient remains very fatigued notes that he spends more than 50% of his time at rest in the house. If it's the least bit cold outside he is unable to tolerate outside exertion.      Diagnosis FINE NEEDLE ASPIRATION,ENDOSCOPIC (C) EBUS 10R # 3 (SPECIMEN 3 OF 3,COLLECTED ON 12/31/15) MALIGNANT CELLS CONSISTENT WITH NON-SMALL CELL CARCINOMA.  Current Activity/ Functional Status:  Patient is independent with mobility/ambulation, transfers, ADL's, IADL's.   Zubrod Score: At the time of surgery this patient's most appropriate activity status/level should be described as: '[x]'$     0    Normal activity, no symptoms '[]'$     1    Restricted in physical strenuous activity but ambulatory, able to do out light work '[x]'$     2    Ambulatory and capable of self care, unable to do work activities, up and about               >50 % of waking hours                              '[]'$     3    Only  limited self care, in bed greater than 50% of waking hours '[]'$     4    Completely disabled, no self care, confined to bed or chair '[]'$     5    Moribund   Past Medical History:  Diagnosis Date  . Arthritis   . Complication of anesthesia    problems voiding post op  . Full dentures   . GERD (gastroesophageal reflux disease)   . History of radiation therapy 01/25/16-02/28/16   right lung 45 Gy  . HOH (hard of hearing)   . HTN (hypertension)   . Hyperlipidemia   . Lung mass 12/30/2015  . Odynophagia 04/06/2016  . Psoriatic arthritis ([place])   . Snores   . Wears glasses     Past Surgical History:  Procedure Laterality Date  . COLONOSCOPY    . HEMORRHOID SURGERY N/A 10/01/2013   Procedure: EXAM UNDER ANESTHESIA  AND EXCISIONAL HEMORRHOIDECTOMY WITH HEMORRHOID BANDING X 2;  Surgeon: Gayland Curry, MD;  Location: [facility];  Service: General;  Laterality: N/A;  . INGUINAL HERNIA REPAIR  01/04/2012   Procedure: LAPAROSCOPIC INGUINAL HERNIA;  Surgeon: Gayland Curry, MD,FACS;  Location: [facility];  Service: General;  Laterality: Right;  Marland Kitchen VIDEO BRONCHOSCOPY WITH ENDOBRONCHIAL ULTRASOUND N/A 12/31/2015   Procedure: VIDEO BRONCHOSCOPY WITH ENDOBRONCHIAL ULTRASOUND transbronchial biopsy of node 10 R lymph node;  Surgeon: Grace Isaac, MD;  Location: [facility] OR;  Service: Thoracic;  Laterality: N/A;    Family History  Problem Relation Age of Onset  . Stroke Father   . Diabetes Father   . Heart disease Father   . Cancer Brother     pancreatic    Social History   Social History  . Marital status: Married    Spouse name: N/A  . Number of children: N/A  . Years of education: N/A   Occupational History  . Not on file.   Social History Main Topics  . Smoking status: Current Every Day Smoker    Packs/day: 0.50    Years: 50.00    Types: Cigarettes  . Smokeless tobacco: Never Used  . Alcohol use Yes     Comment: occassionally  . Drug use: No  . Sexual activity: Not on file       History  Smoking Status  . Current Every Day Smoker  . Packs/day: 0.50  . Years: 50.00  . Types: Cigarettes  Smokeless Tobacco  . Never Used    History  Alcohol Use  . Yes    Comment: occassionally     Allergies  Allergen Reactions  . Hydrocodone Rash  . Oxycodone Rash    Current Outpatient Prescriptions  Medication Sig Dispense Refill  . acetaminophen (TYLENOL) 500 MG tablet Take 500-1,000 mg by mouth 2 (two) times daily as needed for mild pain.    Marland Kitchen albuterol (PROVENTIL HFA;VENTOLIN HFA) 108 (90 Base) MCG/ACT inhaler Inhale 1-2 puffs into the lungs every 6 (six) hours as needed for wheezing or shortness of breath (Wheeze). 1 Inhaler 2  . aspirin EC 81 MG tablet Take 81 mg by mouth at bedtime.     Marland Kitchen aspirin-sod bicarb-citric acid (ALKA-SELTZER) 325 MG TBEF tablet Take 325 mg by mouth every 6 (six) hours as needed (indigestion).    . benzonatate (TESSALON) 100 MG capsule Take 100 mg by mouth 3 (three) times daily as needed for cough.    . clotrimazole-betamethasone (LOTRISONE) cream Apply 1 application topically 2 (two) times daily.    Marland Kitchen dextromethorphan (DELSYM) 30 MG/5ML liquid Take 30 mg by mouth 2 (two) times daily as needed for cough.    . diphenhydramine-acetaminophen (TYLENOL PM) 25-500 MG TABS Take 1 tablet by mouth at bedtime. For sleep    . ENBREL SURECLICK 50 MG/ML injection Inject 50 mg into the skin once a week. Tuesday    . guaiFENesin-dextromethorphan (ROBITUSSIN DM) 100-10 MG/5ML syrup Take 5 mLs by mouth every 4 (four) hours as needed for cough. 118 mL 0  . Multiple Vitamin (MULITIVITAMIN WITH MINERALS) TABS Take 1 tablet by mouth daily.    . pantoprazole (PROTONIX) 40 MG tablet Take 40 mg by mouth daily.     . pravastatin (PRAVACHOL) 40 MG tablet Take 40 mg by mouth at bedtime.     . predniSONE (DELTASONE) 10 MG tablet Take 5 mg by mouth every other day.     . prochlorperazine (COMPAZINE) 10 MG tablet Take 1 tablet (10 mg total) by mouth every 6 (six)  hours as needed for nausea or vomiting. 30 tablet 0  .  sucralfate (CARAFATE) 1 g tablet Take 1 tablet (1 g total) by mouth 4 (four) times daily -  with meals and at bedtime. 120 tablet 0  . tamsulosin (FLOMAX) 0.4 MG CAPS capsule TAKE (1) CAPSULE DAILY, START 4 DAYS BEFORE PROCEDURE (Patient taking differently: take 1 capsule by mouth every morning) 30 capsule 2  . Wound Dressings (SONAFINE) Apply 1 application topically 2 (two) times daily.     No current facility-administered medications for this visit.       Review of Systems:     Cardiac Review of Systems: Y or N  Chest Pain [n    ]  Resting SOB [ n  ] Exertional SOB  [ n ]  Orthopnea [  ]   Pedal Edema [   ]    Palpitations [  ] Syncope  [  ]   Presyncope [n   ]  General Review of Systems: [Y] = yes [  ]=no Constitional: recent weight change [ y ];  Wt loss over the last 3 months [   ] anorexia [  ]; fatigue [  ]; nausea [  ]; night sweats [  ]; fever [  ]; or chills [  ];          Dental: poor dentition[  ]; Last Dentist visit:   Eye : blurred vision [  ]; diplopia [   ]; vision changes [  ];  Amaurosis fugax[  ]; Resp: cough [  ];  wheezing[  ];  hemoptysis[  ]; shortness of breath[  ]; paroxysmal nocturnal dyspnea[  ]; dyspnea on exertion[  ]; or orthopnea[  ];  GI:  gallstones[  ], vomiting[  ];  dysphagia[  ]; melena[  ];  hematochezia [  ]; heartburn[  ];   Hx of  Colonoscopy[  ]; GU: kidney stones [  ]; hematuria[  ];   dysuria [  ];  nocturia[  ];  history of     obstruction [  ]; urinary frequency [  ]             Skin: rash, swelling[  ];, hair loss[  ];  peripheral edema[  ];  or itching[  ]; Musculosketetal: myalgias[  ];  joint swelling[  ];  joint erythema[  ];  joint pain[  ];  back pain[  ];  Heme/Lymph: bruising[  ];  bleeding[  ];  anemia[  ];  Neuro: TIA[n  ];  headaches[  ];  stroke[  ];  vertigo[  ];  seizures[  ];   paresthesias[  ];  difficulty walking[  ];  Psych:depression[  ]; anxiety[  ];  Endocrine:  diabetes[  ];  thyroid dysfunction[  ];  Immunizations: Flu up to date [  ]; Pneumococcal up to date [  ];  Other:  Physical Exam: BP (!) 150/90   Pulse 95   Resp 20   Ht 6' (1.829 m)   Wt 177 lb (80.3 kg)   SpO2 98% Comment: RA  BMI 24.01 kg/m   PHYSICAL EXAMINATION: General appearance: alert, cooperative and appears stated age Head: Normocephalic, without obvious abnormality, atraumatic Neck: no adenopathy, no carotid bruit, no JVD, supple, symmetrical, trachea midline and thyroid not enlarged, symmetric, no tenderness/mass/nodules Lymph nodes: Cervical, supraclavicular, and axillary nodes normal. Resp: clear to auscultation bilaterally Back: symmetric, no curvature. ROM normal. No CVA tenderness. Cardio: regular rate and rhythm, S1, S2 normal, no murmur, click, rub or gallop GI: soft, non-tender; bowel sounds  normal; no masses,  no organomegaly Extremities: extremities normal, atraumatic, no cyanosis or edema Neurologic: Grossly normal  Diagnostic Studies & Laboratory data:     Recent Radiology Findings:   Ct Chest W Contrast  Result Date: 04/03/2016 CLINICAL DATA:  Lung cancer diagnosed in October. Chemotherapy and radiation therapy complete. Stage II adenocarcinoma. EXAM: CT CHEST WITH CONTRAST TECHNIQUE: Multidetector CT imaging of the chest was performed during intravenous contrast administration. CONTRAST:  55m ISOVUE-300 IOPAMIDOL (ISOVUE-300) INJECTION 61% COMPARISON:  Plain films 03/05/2016. CT 12/17/2015. PET 12/27/2015. FINDINGS: Cardiovascular: Advanced aortic and branch vessel atherosclerosis. Normal heart size, without pericardial effusion. Multivessel coronary artery atherosclerosis. No central pulmonary embolism, on this non-dedicated study. Mediastinum/Nodes: No supraclavicular adenopathy. No mediastinal adenopathy. Central right hilar node measures 11 mm on image 66/series 2 versus 1.9 cm on 12/17/2015 CT (when remeasured). More lateral right hilar node/mass  measures 1.2 x 2.1 cm on image 72/series 2. Compare 1.9 x 2.4 cm on the prior exam (when remeasured). Lungs/Pleura: No pleural fluid. Probable secretions in dependent trachea. Moderate centrilobular emphysema.Right perihilar radiation fibrosis, as evidenced by septal thickening and ground-glass opacity. Right middle lobe 6 mm nodule on image 83/ series 5, similar. Presumed scarring more laterally in the right middle lobe. 3 mm right lower lobe pulmonary nodule on image 101/series 5 is not significantly changed. There is also left upper and left lower lobe scarring. A focus of probable nodular scarring in the left lower lobe is unchanged at 10 mm on image 103/series 5. Upper Abdomen: Caudate and lateral segment left liver lobe prominence. Normal imaged portions of the spleen, stomach, pancreas, adrenal glands, left kidney. Upper pole right renal collecting system stone or stones. Abdominal and branch vessel aortic atherosclerosis. Mild ectasia of the celiac at 12 mm on image 162/ series 2, similar. Musculoskeletal: No acute osseous abnormality. IMPRESSION: 1. Response to therapy of right hilar adenopathy/mass. 2. Radiation change in the medial right lung. 3. No new sites of disease identified. 4. Similar pulmonary nodularity and areas of scarring. 5.  Coronary artery atherosclerosis. Aortic atherosclerosis. 6. Hepatic morphology for which mild cirrhosis cannot be excluded. Correlate with risk factors. 7. Right nephrolithiasis. Electronically Signed   By: KAbigail MiyamotoM.D.   On: 04/03/2016 15:00   I have independently reviewed the above radiology studies  and reviewed the findings with the patient.    Ct Chest W Contrast  Result Date: 12/17/2015 CLINICAL DATA:  Abnormal chest x-ray, history of tobacco use EXAM: CT CHEST WITH CONTRAST TECHNIQUE: Multidetector CT imaging of the chest was performed during intravenous contrast administration. CONTRAST:  750mISOVUE-300 IOPAMIDOL (ISOVUE-300) INJECTION 61%  COMPARISON:  12/03/2015 FINDINGS: Cardiovascular: The thoracic aorta shows calcific changes without aneurysmal dilatation or dissection. The pulmonary artery as visualized shows no evidence of pulmonary emboli. Coronary calcifications are noted. The cardiac structures are otherwise within normal limits. Mediastinum/Nodes: The thoracic inlet is within normal limits. No significant mediastinal adenopathy is seen. Right hilar adenopathy is identified measuring 15 by 21 mm in greatest dimension. Lungs/Pleura: The lungs are well aerated bilaterally and demonstrate some mild emphysematous changes. In the right lung there are scattered nodules identified the largest of which lies within the right middle lobe measuring 6 mm. A few smaller nodules are seen. The left lung also shows nodular areas. There are 2 dominant nodules which appears somewhat linked by a intervening soft tissue. The more inferior of these is noted on image number 106 of series 5 and measures approximately 11 by 14 mm in  greatest dimension. Superior to this and interconnected by intervening soft tissue is an area of nodular density measuring at least 14 by 16 mm. Upper Abdomen: No acute abnormality noted. Musculoskeletal: Degenerative changes of the thoracic spine IMPRESSION: Bilateral pulmonary nodules with evidence of right hilar adenopathy. Neoplasm is felt to be highly likely. PET-CT may be helpful for further evaluation. These results will be called to the ordering clinician or representative by the Radiologist Assistant, and communication documented in the PACS or zVision Dashboard. Electronically Signed   By: Inez Catalina M.D.   On: 12/17/2015 12:02   Ct Abdomen Pelvis W Contrast  Result Date: 12/03/2015 CLINICAL DATA:  Diarrhea and weight loss. EXAM: CT ABDOMEN AND PELVIS WITH CONTRAST TECHNIQUE: Multidetector CT imaging of the abdomen and pelvis was performed using the standard protocol following bolus administration of intravenous contrast.  CONTRAST:  134m ISOVUE-300 IOPAMIDOL (ISOVUE-300) INJECTION 61% COMPARISON:  None. FINDINGS: Lower chest: No acute pulmonary findings. Bibasilar scarring changes. No infiltrates or effusions. No worrisome pulmonary lesions. The heart is normal in size. No pericardial effusion. No coronary artery calcifications. The distal esophagus is grossly normal. Hepatobiliary: No focal hepatic lesions or intrahepatic biliary dilatation. The gallbladder is normal. No common bile duct dilatation. Pancreas: No mass, inflammation or ductal dilatation. Spleen: Normal size.  No focal lesions. Adrenals/Urinary Tract: The adrenal glands are normal. Bilateral renal calculi and renal scarring changes. No hydronephrosis or obstructing ureteral calculi. Small renal cysts are noted. No worrisome renal lesions. No significant collecting system abnormalities on the delayed images. 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 Stomach/Bowel: The stomach, duodenum, small-bowel and colon or unremarkable. No inflammatory changes, mass lesions or obstructive findings. The terminal ileum is normal. The appendix is normal. There is diffuse distal descending and sigmoid diverticulosis and muscular wall thickening but no definite findings for acute diverticulitis. No mass or obstruction. Vascular/Lymphatic: Moderate atherosclerotic calcifications involving the aorta and branch vessel ostia. No aneurysm or dissection. Other: Enlarged prostate gland with median lobe hypertrophy impressing on the base the bladder. The bladder wall is mildly thickened and this is likely due to partial bladder outlet obstruction. No bladder mass. The seminal vesicles are unremarkable. No pelvic mass or adenopathy. No free pelvic fluid collections. No inguinal mass or adenopathy. Slightly dilated vessels in the left spermatic cord could suggest varicocele. Musculoskeletal: No significant bony findings. Degenerative changes noted in the spine head hips. IMPRESSION:  1. ureteral calculi or bladder calculi. 2. Renal scarring changes and small cysts. 3. Descending and sigmoid colon diverticulosis without definite findings for acute diverticulitis. 4. No abdominal/pelvic mass lesions or lymphadenopathy. 5. Moderate atherosclerotic changes involving the aorta and branch vessels. 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 Electronically Signed   By: PMarijo SanesM.D.   On: 12/03/2015 10:01   Nm Pet Image Initial (pi) Skull Base To Thigh  Result Date: 12/27/2015 CLINICAL DATA:  Initial treatment strategy for bilateral pulmonary nodules on CT. EXAM: NUCLEAR MEDICINE PET SKULL BASE TO THIGH TECHNIQUE: 8.46 mCi F-18 FDG was injected intravenously. Full-ring PET imaging was performed from the skull base to thigh after the radiotracer. CT data was obtained and used for attenuation correction and anatomic localization. FASTING BLOOD GLUCOSE:  Value: 114 Mg/dl COMPARISON:  Abdominal pelvic CT 12/03/2015.  Chest CT 12/17/2015. FINDINGS: NECK No hypermetabolic cervical lymph nodes are identified.There are no lesions of the pharyngeal mucosal space. Chronic atherosclerosis noted. CHEST Approximately 2.8 cm right hilar mass is hypermetabolic with an  SUV max of 13.7. No other hypermetabolic mediastinal, hilar or axillary lymph nodes are seen. There are no hypermetabolic pulmonary nodules. Specifically, no abnormal activity is seen associated with the 6 mm right middle lobe nodule on image 44 or the 9 x 5 mm right middle lobe nodule on image 46. These nodules are suboptimally evaluated based on size. The irregular left lung densities demonstrate no abnormal metabolic activity, favored to reflect postinflammatory scarring based on morphology. Moderate emphysematous changes are present. ABDOMEN/PELVIS There is no hypermetabolic activity within the liver, adrenal glands, spleen or pancreas. There is no hypermetabolic nodal activity. The liver demonstrates mildly decreased density  suspicious for steatosis. There are bilateral nonobstructing renal calculi and cortical scarring in both kidneys. There are diverticular changes within the sigmoid colon, enlargement of the prostate gland and diffuse bladder wall thickening. There is aortic and branch vessel atherosclerosis. SKELETON There is no hypermetabolic activity to suggest osseous metastatic disease. IMPRESSION: 1. Hypermetabolic right hilar mass worrisome for central bronchogenic carcinoma or metastatic disease to right hilar lymph nodes. Bronchoscopy suggested for tissue sampling. 2. No other suspicious metabolic activity. The small nodules in the right middle lobe demonstrate no abnormal metabolic activity, although are not optimally evaluated based on size. The left lung findings are favored to be postinflammatory. 3. No evidence of metastatic disease within the abdomen or pelvis. 4. Bilateral nephrolithiasis and diverticulosis of the distal colon. Electronically Signed   By: Richardean Sale M.D.   On: 12/27/2015 14:31     I have independently reviewed the above radiologic studies.  Recent Lab Findings: Lab Results  Component Value Date   WBC 8.1 04/06/2016   HGB 12.5 (L) 04/06/2016   HCT 37.2 (L) 04/06/2016   PLT 170 04/06/2016   GLUCOSE 112 04/06/2016   ALT 13 04/06/2016   AST 22 04/06/2016   NA 140 04/06/2016   K 4.7 04/06/2016   CL 99 (L) 03/05/2016   CREATININE 1.2 04/06/2016   BUN 13.8 04/06/2016   CO2 22 04/06/2016   INR 1.06 12/31/2015      Assessment / Plan:   The patient has had obvious response to the radiation and chemotherapy, however the central location of his original tumor would require pneumonectomy for resection, in discussing with the patient his current activity level I do not think he would tolerate such a procedure and discuss this with him.  We will review his films in the multidisciplinary thoracic conference D discussed continuing with treatment at this point versus observation and  will be referred back to medical oncology.  Grace Isaac MD      [address] [place] [address] Office [phone]   Beeper [phone]  04/10/2016 4:36 PM

## 2016-04-11 ENCOUNTER — Telehealth: Payer: Self-pay | Admitting: *Deleted

## 2016-04-11 ENCOUNTER — Encounter: Payer: Self-pay | Admitting: Internal Medicine

## 2016-04-11 ENCOUNTER — Ambulatory Visit (HOSPITAL_BASED_OUTPATIENT_CLINIC_OR_DEPARTMENT_OTHER): Payer: Medicare Other | Admitting: Internal Medicine

## 2016-04-11 DIAGNOSIS — C3401 Malignant neoplasm of right main bronchus: Secondary | ICD-10-CM

## 2016-04-11 DIAGNOSIS — R1310 Dysphagia, unspecified: Secondary | ICD-10-CM

## 2016-04-11 DIAGNOSIS — C3491 Malignant neoplasm of unspecified part of right bronchus or lung: Secondary | ICD-10-CM

## 2016-04-11 DIAGNOSIS — Z5111 Encounter for antineoplastic chemotherapy: Secondary | ICD-10-CM

## 2016-04-11 NOTE — Progress Notes (Signed)
[place] [phone]   [phone]  OFFICE PROGRESS NOTE  Sherrie Mustache, MD [place] [address]  DIAGNOSIS: Unresectable a stage IIA (T1a, N1, M0) non-small cell lung cancer, adenocarcinoma presented with right hilar mass and right hilar adenopathy diagnosed in September 2017  PRIOR THERAPY: Status post a course of concurrent chemoradiation with weekly carboplatin for AUC of 2 and paclitaxel 45 MG/M2 for a total of 5 weeks of combined treatment with partial response. Restaging workup showed the patient is not a good surgical candidate for resection.  CURRENT THERAPY: Resuming concurrent chemoradiation with weekly carboplatin for AUC of 2 and paclitaxel 45 MG/M2, first dose 04/17/2016.  INTERVAL HISTORY: Mark Howell 75 y.o. male returns to the clinic today for follow-up visit accompanied by his wife and son. The patient is feeling fine today except for the mild odynophagia and cough. He resumed his treatment with Carafate again. He was referred to Dr. Servando Snare for consideration of surgical resection after the initial course of concurrent chemoradiation. Unfortunately the patient is not a good surgical candidate for resection and he would require right pneumonectomy if he would like to proceed with surgery. Dr. Servando Snare referred him back to me for evaluation and recommendation regarding treatment of his condition. He is otherwise feeling well. He denied having any weight loss or night sweats. He has no nausea or vomiting, no fever or chills.  MEDICAL HISTORY: Past Medical History:  Diagnosis Date  . Arthritis   . Complication of anesthesia    problems voiding post op  . Full dentures   . GERD (gastroesophageal reflux disease)   . History of radiation therapy 01/25/16-02/28/16   right lung 45 Gy  . HOH (hard of hearing)   . HTN (hypertension)   . Hyperlipidemia   . Lung mass 12/30/2015  . Odynophagia 04/06/2016  .  Psoriatic arthritis ([place])   . Snores   . Wears glasses     ALLERGIES:  is allergic to hydrocodone and oxycodone.  MEDICATIONS:  Current Outpatient Prescriptions  Medication Sig Dispense Refill  . acetaminophen (TYLENOL) 500 MG tablet Take 500-1,000 mg by mouth 2 (two) times daily as needed for mild pain.    Marland Kitchen albuterol (PROVENTIL HFA;VENTOLIN HFA) 108 (90 Base) MCG/ACT inhaler Inhale 1-2 puffs into the lungs every 6 (six) hours as needed for wheezing or shortness of breath (Wheeze). 1 Inhaler 2  . aspirin EC 81 MG tablet Take 81 mg by mouth at bedtime.     Marland Kitchen aspirin-sod bicarb-citric acid (ALKA-SELTZER) 325 MG TBEF tablet Take 325 mg by mouth every 6 (six) hours as needed (indigestion).    . benzonatate (TESSALON) 100 MG capsule Take 100 mg by mouth 3 (three) times daily as needed for cough.    . clotrimazole-betamethasone (LOTRISONE) cream Apply 1 application topically 2 (two) times daily.    Marland Kitchen dextromethorphan (DELSYM) 30 MG/5ML liquid Take 30 mg by mouth 2 (two) times daily as needed for cough.    . diphenhydramine-acetaminophen (TYLENOL PM) 25-500 MG TABS Take 1 tablet by mouth at bedtime. For sleep    . ENBREL SURECLICK 50 MG/ML injection Inject 50 mg into the skin once a week. Tuesday    . guaiFENesin-dextromethorphan (ROBITUSSIN DM) 100-10 MG/5ML syrup Take 5 mLs by mouth every 4 (four) hours as needed for cough. 118 mL 0  . Multiple Vitamin (MULITIVITAMIN WITH MINERALS) TABS Take 1 tablet by mouth daily.    . pantoprazole (PROTONIX) 40 MG  tablet Take 40 mg by mouth daily.     . pravastatin (PRAVACHOL) 40 MG tablet Take 40 mg by mouth at bedtime.     . predniSONE (DELTASONE) 10 MG tablet Take 5 mg by mouth every other day.     . prochlorperazine (COMPAZINE) 10 MG tablet Take 1 tablet (10 mg total) by mouth every 6 (six) hours as needed for nausea or vomiting. 30 tablet 0  . sucralfate (CARAFATE) 1 g tablet Take 1 tablet (1 g total) by mouth 4 (four) times daily -  with meals and at  bedtime. 120 tablet 0  . tamsulosin (FLOMAX) 0.4 MG CAPS capsule TAKE (1) CAPSULE DAILY, START 4 DAYS BEFORE PROCEDURE (Patient taking differently: take 1 capsule by mouth every morning) 30 capsule 2  . Wound Dressings (SONAFINE) Apply 1 application topically 2 (two) times daily.     No current facility-administered medications for this visit.     SURGICAL HISTORY:  Past Surgical History:  Procedure Laterality Date  . COLONOSCOPY    . HEMORRHOID SURGERY N/A 10/01/2013   Procedure: EXAM UNDER ANESTHESIA  AND EXCISIONAL HEMORRHOIDECTOMY WITH HEMORRHOID BANDING X 2;  Surgeon: Gayland Curry, MD;  Location: [facility];  Service: General;  Laterality: N/A;  . INGUINAL HERNIA REPAIR  01/04/2012   Procedure: LAPAROSCOPIC INGUINAL HERNIA;  Surgeon: Gayland Curry, MD,FACS;  Location: [facility];  Service: General;  Laterality: Right;  Marland Kitchen VIDEO BRONCHOSCOPY WITH ENDOBRONCHIAL ULTRASOUND N/A 12/31/2015   Procedure: VIDEO BRONCHOSCOPY WITH ENDOBRONCHIAL ULTRASOUND transbronchial biopsy of node 10 R lymph node;  Surgeon: Grace Isaac, MD;  Location: [facility];  Service: Thoracic;  Laterality: N/A;    REVIEW OF SYSTEMS:  Constitutional: negative Eyes: negative Ears, nose, mouth, throat, and face: negative Respiratory: positive for cough Cardiovascular: negative Gastrointestinal: positive for odynophagia Genitourinary:negative Integument/breast: negative Hematologic/lymphatic: negative Musculoskeletal:negative Neurological: negative Behavioral/Psych: negative Endocrine: negative Allergic/Immunologic: negative   PHYSICAL EXAMINATION: General appearance: alert, cooperative and no distress Head: Normocephalic, without obvious abnormality, atraumatic Neck: no adenopathy, no JVD, supple, symmetrical, trachea midline and thyroid not enlarged, symmetric, no tenderness/mass/nodules Lymph nodes: Cervical, supraclavicular, and axillary nodes normal. Resp: clear to auscultation bilaterally Back:  symmetric, no curvature. ROM normal. No CVA tenderness. Cardio: regular rate and rhythm, S1, S2 normal, no murmur, click, rub or gallop GI: soft, non-tender; bowel sounds normal; no masses,  no organomegaly Extremities: extremities normal, atraumatic, no cyanosis or edema Neurologic: Alert and oriented X 3, normal strength and tone. Normal symmetric reflexes. Normal coordination and gait  ECOG PERFORMANCE STATUS: 1 - Symptomatic but completely ambulatory  There were no vitals taken for this visit.  LABORATORY DATA: Lab Results  Component Value Date   WBC 8.1 04/06/2016   HGB 12.5 (L) 04/06/2016   HCT 37.2 (L) 04/06/2016   MCV 93.4 04/06/2016   PLT 170 04/06/2016      Chemistry      Component Value Date/Time   NA 140 04/06/2016 0833   K 4.7 04/06/2016 0833   CL 99 (L) 03/05/2016 1344   CO2 22 04/06/2016 0833   BUN 13.8 04/06/2016 0833   CREATININE 1.2 04/06/2016 0833      Component Value Date/Time   CALCIUM 9.5 04/06/2016 0833   ALKPHOS 94 04/06/2016 0833   AST 22 04/06/2016 0833   ALT 13 04/06/2016 0833   BILITOT 0.42 04/06/2016 0833       RADIOGRAPHIC STUDIES: Ct Chest W Contrast  Result Date: 04/03/2016 CLINICAL DATA:  Lung cancer diagnosed in  October. Chemotherapy and radiation therapy complete. Stage II adenocarcinoma. EXAM: CT CHEST WITH CONTRAST TECHNIQUE: Multidetector CT imaging of the chest was performed during intravenous contrast administration. CONTRAST:  46m ISOVUE-300 IOPAMIDOL (ISOVUE-300) INJECTION 61% COMPARISON:  Plain films 03/05/2016. CT 12/17/2015. PET 12/27/2015. FINDINGS: Cardiovascular: Advanced aortic and branch vessel atherosclerosis. Normal heart size, without pericardial effusion. Multivessel coronary artery atherosclerosis. No central pulmonary embolism, on this non-dedicated study. Mediastinum/Nodes: No supraclavicular adenopathy. No mediastinal adenopathy. Central right hilar node measures 11 mm on image 66/series 2 versus 1.9 cm on  12/17/2015 CT (when remeasured). More lateral right hilar node/mass measures 1.2 x 2.1 cm on image 72/series 2. Compare 1.9 x 2.4 cm on the prior exam (when remeasured). Lungs/Pleura: No pleural fluid. Probable secretions in dependent trachea. Moderate centrilobular emphysema.Right perihilar radiation fibrosis, as evidenced by septal thickening and ground-glass opacity. Right middle lobe 6 mm nodule on image 83/ series 5, similar. Presumed scarring more laterally in the right middle lobe. 3 mm right lower lobe pulmonary nodule on image 101/series 5 is not significantly changed. There is also left upper and left lower lobe scarring. A focus of probable nodular scarring in the left lower lobe is unchanged at 10 mm on image 103/series 5. Upper Abdomen: Caudate and lateral segment left liver lobe prominence. Normal imaged portions of the spleen, stomach, pancreas, adrenal glands, left kidney. Upper pole right renal collecting system stone or stones. Abdominal and branch vessel aortic atherosclerosis. Mild ectasia of the celiac at 12 mm on image 162/ series 2, similar. Musculoskeletal: No acute osseous abnormality. IMPRESSION: 1. Response to therapy of right hilar adenopathy/mass. 2. Radiation change in the medial right lung. 3. No new sites of disease identified. 4. Similar pulmonary nodularity and areas of scarring. 5.  Coronary artery atherosclerosis. Aortic atherosclerosis. 6. Hepatic morphology for which mild cirrhosis cannot be excluded. Correlate with risk factors. 7. Right nephrolithiasis. Electronically Signed   By: KAbigail MiyamotoM.D.   On: 04/03/2016 15:00    ASSESSMENT AND PLAN: This is a very pleasant 75years old white male with unresectable stage II a non-small cell lung cancer status post short course of concurrent chemoradiation with partial response. The patient was seen by Dr. GServando Snareand he is not a good candidate for surgical resection and he probably would require right pneumonectomy for his  condition. I had a lengthy discussion with the patient and his family today about his condition and further treatment options. I recommended for the patient to resume his course of concurrent chemoradiation with weekly carboplatin and paclitaxel for 2-3 weeks. I reminded the patient adverse effect of this treatment including but not limited to alopecia, myelosuppression, nausea and vomiting, peripheral neuropathy, liver or renal dysfunction. He is expected to start the first dose of this treatment next week. I will consult with Dr. KSondra Cometo resume his radiotherapy next week too. For the odynophagia, the patient was advised to continue on Carafate and pain medication. I will see him back for follow-up visit in 3 weeks for reevaluation. The patient voices understanding of current disease status and treatment options and is in agreement with the current care plan.  All questions were answered. The patient knows to call the clinic with any problems, questions or concerns. We can certainly see the patient much sooner if necessary.  I spent 15 minutes counseling the patient face to face. The total time spent in the appointment was 25 minutes.  Disclaimer: This note was dictated with voice recognition software. Similar sounding words can  inadvertently be transcribed and may not be corrected upon review.       

## 2016-04-11 NOTE — Telephone Encounter (Signed)
Oncology Nurse Navigator Documentation  Oncology Nurse Navigator Flowsheets 04/11/2016  Navigator Location [facility]  Navigator Encounter Type Telephone  Telephone Outgoing Call/per Dr. Worthy Flank request, I called patient to schedule him to see Dr. Julien Nordmann today at 2:00.  He verbalized understanding of appt time and place.   Treatment Phase Pre-Tx/Tx Discussion  Barriers/Navigation Needs Coordination of Care  Interventions Coordination of Care  Coordination of Care Appts  Acuity Level 2  Acuity Level 2 Assistance expediting appointments  Time Spent with Patient 30

## 2016-04-12 ENCOUNTER — Ambulatory Visit
Admission: RE | Admit: 2016-04-12 | Discharge: 2016-04-12 | Disposition: A | Discharge status: Home / Self Care | Payer: Medicare Other | Source: Ambulatory Visit | Attending: Radiation Oncology | Admitting: Radiation Oncology

## 2016-04-12 DIAGNOSIS — C3491 Malignant neoplasm of unspecified part of right bronchus or lung: Secondary | ICD-10-CM

## 2016-04-12 DIAGNOSIS — Z510 Encounter for antineoplastic radiation therapy: Secondary | ICD-10-CM | POA: Diagnosis not present

## 2016-04-12 NOTE — Progress Notes (Signed)
  Radiation Oncology         [phone] ________________________________  Name: Mark Howell MRN: [id number]  Date: 04/12/2016  DOB: 02/23/1941  SIMULATION AND TREATMENT PLANNING NOTE    ICD-9-CM ICD-10-CM   1. Adenocarcinoma of right lung, stage 2 (HCC) 162.9 C34.91     DIAGNOSIS:  Unresectable a stage IIA (T1a, N1, M0) non-small cell lung cancer, adenocarcinoma presented with right hilar mass and right hilar adenopathy diagnosed in September 2017  Status post a course of concurrent chemoradiation with weekly carboplatin for AUC of 2 and paclitaxel 45 MG/M2 for a total of 5 weeks of combined treatment with partial response. Restaging workup showed the patient is not a good surgical candidate for resection.   He was referred to Dr. Servando Snare for consideration of surgical resection after the initial course of concurrent chemoradiation. Unfortunately the patient is not a good surgical candidate for resection and he would require right pneumonectomy if he would like to proceed with surgery. Patient elected to proceed with additional radiation along with radiosensitizing chemotherapy  NARRATIVE:  The patient was brought to the [facility].  Identity was confirmed.  All relevant records and images related to the planned course of therapy were reviewed.  The patient freely provided informed written consent to proceed with treatment after reviewing the details related to the planned course of therapy. The consent form was witnessed and verified by the simulation staff.  Then, the patient was set-up in a stable reproducible  supine position for radiation therapy.  CT images were obtained.  Surface markings were placed.  The CT images were loaded into the planning software.  Then the target and avoidance structures were contoured.  Treatment planning then occurred.  The radiation prescription was entered and confirmed.  Then, I designed and supervised the construction of a total of 5  medically necessary complex treatment devices.  I have requested : 3D Simulation  I have requested a DVH of the following structures: heart, lungs, spinal cord PTV.  I have ordered:dose calc.  PLAN:  The patient will receive 20 Gy in 10 fractions for a cumulative dose to the area of presentation of 65 gray.  -----------------------------------    Special Treatment Procedure Note: The patient will be receiving radiosensitizing chemotherapy. Given the potential of increased toxicities related to combined therapy and the necessity for close monitoring of the patient and blood work, this constitutes a special treatment procedure.    Blair Promise, PhD, MD

## 2016-04-13 ENCOUNTER — Encounter: Payer: Self-pay | Admitting: *Deleted

## 2016-04-13 NOTE — Progress Notes (Signed)
Oncology Nurse Navigator Documentation  Oncology Nurse Navigator Flowsheets 04/13/2016  Navigator Location [facility]  Navigator Encounter Type Other/per cancer conference discussion, patient is to have more treatment.  Treatment plan is concurrent chemo rad.  Patient is scheduled for chemo on 04/17/16 and port and treat with Rad Onc on 04/17/16.   Treatment Phase Pre-Tx/Tx Discussion  Barriers/Navigation Needs Coordination of Care  Interventions Coordination of Care  Coordination of Care Appts  Acuity Level 2  Acuity Level 2 Assistance expediting appointments  Time Spent with Patient 30

## 2016-04-14 ENCOUNTER — Telehealth: Payer: Self-pay | Admitting: Internal Medicine

## 2016-04-14 NOTE — Telephone Encounter (Signed)
Spoke with patient re appointments for 12/18, 12/26 and 1/4.

## 2016-04-17 ENCOUNTER — Ambulatory Visit (HOSPITAL_BASED_OUTPATIENT_CLINIC_OR_DEPARTMENT_OTHER): Payer: Medicare Other

## 2016-04-17 ENCOUNTER — Ambulatory Visit
Admission: RE | Admit: 2016-04-17 | Discharge: 2016-04-17 | Disposition: A | Discharge status: Home / Self Care | Payer: Medicare Other | Source: Ambulatory Visit | Attending: Radiation Oncology | Admitting: Radiation Oncology

## 2016-04-17 ENCOUNTER — Other Ambulatory Visit (HOSPITAL_BASED_OUTPATIENT_CLINIC_OR_DEPARTMENT_OTHER): Payer: Medicare Other

## 2016-04-17 VITALS — BP 135/95 | HR 77 | Temp 98.1°F | Resp 20

## 2016-04-17 DIAGNOSIS — C3491 Malignant neoplasm of unspecified part of right bronchus or lung: Secondary | ICD-10-CM

## 2016-04-17 DIAGNOSIS — C3401 Malignant neoplasm of right main bronchus: Secondary | ICD-10-CM | POA: Diagnosis not present

## 2016-04-17 DIAGNOSIS — Z5111 Encounter for antineoplastic chemotherapy: Secondary | ICD-10-CM | POA: Diagnosis not present

## 2016-04-17 DIAGNOSIS — Z510 Encounter for antineoplastic radiation therapy: Secondary | ICD-10-CM | POA: Diagnosis present

## 2016-04-17 LAB — CBC WITH DIFFERENTIAL/PLATELET
BASO%: 0.7 % (ref 0.0–2.0)
Basophils Absolute: 0.1 10*3/uL (ref 0.0–0.1)
EOS ABS: 0.3 10*3/uL (ref 0.0–0.5)
EOS%: 4 % (ref 0.0–7.0)
HCT: 39.1 % (ref 38.4–49.9)
HGB: 12.9 g/dL — ABNORMAL LOW (ref 13.0–17.1)
LYMPH%: 10.8 % — AB (ref 14.0–49.0)
MCH: 31.2 pg (ref 27.2–33.4)
MCHC: 32.9 g/dL (ref 32.0–36.0)
MCV: 94.7 fL (ref 79.3–98.0)
MONO#: 0.8 10*3/uL (ref 0.1–0.9)
MONO%: 10 % (ref 0.0–14.0)
NEUT%: 74.5 % (ref 39.0–75.0)
NEUTROS ABS: 5.7 10*3/uL (ref 1.5–6.5)
Platelets: 132 10*3/uL — ABNORMAL LOW (ref 140–400)
RBC: 4.12 10*6/uL — AB (ref 4.20–5.82)
RDW: 15.8 % — ABNORMAL HIGH (ref 11.0–14.6)
WBC: 7.7 10*3/uL (ref 4.0–10.3)
lymph#: 0.8 10*3/uL — ABNORMAL LOW (ref 0.9–3.3)

## 2016-04-17 LAB — COMPREHENSIVE METABOLIC PANEL
ALT: 14 U/L (ref 0–55)
AST: 22 U/L (ref 5–34)
Albumin: 3.7 g/dL (ref 3.5–5.0)
Alkaline Phosphatase: 87 U/L (ref 40–150)
Anion Gap: 11 mEq/L (ref 3–11)
BUN: 11.9 mg/dL (ref 7.0–26.0)
CO2: 22 meq/L (ref 22–29)
Calcium: 9.4 mg/dL (ref 8.4–10.4)
Chloride: 108 mEq/L (ref 98–109)
Creatinine: 1.1 mg/dL (ref 0.7–1.3)
EGFR: 69 mL/min/{1.73_m2} — AB (ref >90)
Glucose: 124 mg/dl (ref 70–140)
POTASSIUM: 4 meq/L (ref 3.5–5.1)
SODIUM: 141 meq/L (ref 136–145)
Total Bilirubin: 0.42 mg/dL (ref 0.20–1.20)
Total Protein: 7.6 g/dL (ref 6.4–8.3)

## 2016-04-17 MED ORDER — PALONOSETRON HCL INJECTION 0.25 MG/5ML
INTRAVENOUS | Status: AC
  Filled 2016-04-17: qty 5

## 2016-04-17 MED ORDER — DIPHENHYDRAMINE HCL 50 MG/ML IJ SOLN
50.0000 mg | Freq: Once | INTRAMUSCULAR | Status: AC
  Administered 2016-04-17: 50 mg via INTRAVENOUS

## 2016-04-17 MED ORDER — CARBOPLATIN CHEMO INJECTION 450 MG/45ML
180.6000 mg | Freq: Once | INTRAVENOUS | Status: AC
  Administered 2016-04-17: 180 mg via INTRAVENOUS
  Filled 2016-04-17: qty 18

## 2016-04-17 MED ORDER — SODIUM CHLORIDE 0.9 % IV SOLN
Freq: Once | INTRAVENOUS | Status: AC
  Administered 2016-04-17: 14:00:00 via INTRAVENOUS

## 2016-04-17 MED ORDER — DIPHENHYDRAMINE HCL 50 MG/ML IJ SOLN
INTRAMUSCULAR | Status: AC
  Filled 2016-04-17: qty 1

## 2016-04-17 MED ORDER — DEXAMETHASONE SODIUM PHOSPHATE 100 MG/10ML IJ SOLN
20.0000 mg | Freq: Once | INTRAMUSCULAR | Status: AC
  Administered 2016-04-17: 20 mg via INTRAVENOUS
  Filled 2016-04-17: qty 2

## 2016-04-17 MED ORDER — FAMOTIDINE IN NACL 20-0.9 MG/50ML-% IV SOLN
20.0000 mg | Freq: Once | INTRAVENOUS | Status: AC
  Administered 2016-04-17: 20 mg via INTRAVENOUS

## 2016-04-17 MED ORDER — FAMOTIDINE IN NACL 20-0.9 MG/50ML-% IV SOLN
INTRAVENOUS | Status: AC
  Filled 2016-04-17: qty 50

## 2016-04-17 MED ORDER — SODIUM CHLORIDE 0.9 % IV SOLN
45.0000 mg/m2 | Freq: Once | INTRAVENOUS | Status: AC
  Administered 2016-04-17: 90 mg via INTRAVENOUS
  Filled 2016-04-17: qty 15

## 2016-04-17 MED ORDER — PALONOSETRON HCL INJECTION 0.25 MG/5ML
0.2500 mg | Freq: Once | INTRAVENOUS | Status: AC
  Administered 2016-04-17: 0.25 mg via INTRAVENOUS

## 2016-04-17 NOTE — Progress Notes (Signed)
  Radiation Oncology         [phone] ________________________________  Name: Mark Howell MRN: [id number]  Date: 04/17/2016  DOB: 01-03-1941  Simulation Verification Note    ICD-9-CM ICD-10-CM   1. 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 The clinical setup was verified. Then port films were obtained and uploaded to the radiation oncology medical record software.  The treatment beams were carefully compared against the planned radiation fields. The position location and shape of the radiation fields was reviewed. They targeted volume of tissue appears to be appropriately covered by the radiation beams. Organs at risk appear to be excluded as planned.  Based on my personal review, I approved the simulation verification. The patient's treatment will proceed as planned.  -----------------------------------  Blair Promise, PhD, MD

## 2016-04-17 NOTE — Patient Instructions (Signed)
[facility] Discharge Instructions for Patients Receiving Chemotherapy  Today you received the following chemotherapy agents Taxol and Carboplatin  To help prevent nausea and vomiting after your treatment, we encourage you to take your nausea medication as directed. No Zofran for 3 days. Take Compazine instead.   If you develop nausea and vomiting that is not controlled by your nausea medication, call the clinic.   BELOW ARE SYMPTOMS THAT SHOULD BE REPORTED IMMEDIATELY:  *FEVER GREATER THAN 100.5 F  *CHILLS WITH OR WITHOUT FEVER  NAUSEA AND VOMITING THAT IS NOT CONTROLLED WITH YOUR NAUSEA MEDICATION  *UNUSUAL SHORTNESS OF BREATH  *UNUSUAL BRUISING OR BLEEDING  TENDERNESS IN MOUTH AND THROAT WITH OR WITHOUT PRESENCE OF ULCERS  *URINARY PROBLEMS  *BOWEL PROBLEMS  UNUSUAL RASH Items with * indicate a potential emergency and should be followed up as soon as possible.  Feel free to call the clinic you have any questions or concerns. The clinic phone number is [phone].  Please show the CHEMO ALERT CARD at check-in to the Emergency Department and triage nurse.   

## 2016-04-18 ENCOUNTER — Ambulatory Visit
Admission: RE | Admit: 2016-04-18 | Discharge: 2016-04-18 | Disposition: A | Discharge status: Home / Self Care | Payer: Medicare Other | Source: Ambulatory Visit | Attending: Radiation Oncology | Admitting: Radiation Oncology

## 2016-04-18 VITALS — BP 136/83 | HR 78 | Temp 97.6°F | Resp 18 | Wt 182.4 lb

## 2016-04-18 DIAGNOSIS — C3491 Malignant neoplasm of unspecified part of right bronchus or lung: Secondary | ICD-10-CM

## 2016-04-18 DIAGNOSIS — Z510 Encounter for antineoplastic radiation therapy: Secondary | ICD-10-CM | POA: Diagnosis not present

## 2016-04-18 NOTE — Progress Notes (Signed)
Weight and vitals stable. Denies pain. Reports his cough has resolved. Reports he no longer has to take a cough suppressant. Reports SOB has greatly improved. Reports pain and difficulty associated with swallowing is less. Reports he continues to use carafate sporadically. Denies skin changes within treatment field. Reports he has sonafine and understands to apply this twice per day but, not four hours prior to xrt.   BP 136/83 (BP Location: Left Arm, Patient Position: Sitting, Cuff Size: Normal)   Pulse 78   Temp 97.6 F (36.4 C) (Oral)   Resp 18   Wt 182 lb 6.4 oz (82.7 kg)   SpO2 100%   BMI 24.74 kg/m  Wt Readings from Last 3 Encounters:  04/18/16 182 lb 6.4 oz (82.7 kg)  04/10/16 177 lb (80.3 kg)  04/06/16 177 lb (80.3 kg)

## 2016-04-18 NOTE — Progress Notes (Signed)
  Radiation Oncology         [phone] ________________________________  Name: Mark Howell MRN: [id number]  Date: 04/18/2016  DOB: [birth year]  Weekly Radiation Therapy Management    ICD-9-CM ICD-10-CM   1. Adenocarcinoma of right lung, stage 2 (HCC) 162.9 C34.91      Current Dose: 4 Gy     Planned Dose:  20 Gy (cumulative - 65 GY)  Narrative . . . . . . . . The patient presents for routine under treatment assessment.                                    Denies pain. Reports his cough has resolved. Reports he no longer has to take a cough suppressant. Reports SOB has greatly improved. Reports pain and difficulty associated with swallowing has improved. Reports he continues to use carafate sporadically. Denies skin changes within the treatment field. Reports he has sonafine and understands to apply this twice per day, but not four hours prior to xrt.                                  Set-up films were reviewed.                                 The chart was checked. Physical Findings. . .  weight is 182 lb 6.4 oz (82.7 kg). His oral temperature is 97.6 F (36.4 C). His blood pressure is 136/83 and his pulse is 78. His respiration is 18 and oxygen saturation is 100%. . Weight essentially stable.  Lungs are clear to auscultation bilaterally. Heart has regular rate and rhythm. Impression . . . . . . . The patient is tolerating radiation. Plan . . . . . . . . . . . . Continue treatment as planned.  ________________________________   Blair Promise, PhD, MD

## 2016-04-19 ENCOUNTER — Ambulatory Visit
Admission: RE | Admit: 2016-04-19 | Discharge: 2016-04-19 | Disposition: A | Discharge status: Home / Self Care | Payer: Medicare Other | Source: Ambulatory Visit | Attending: Radiation Oncology | Admitting: Radiation Oncology

## 2016-04-19 DIAGNOSIS — Z510 Encounter for antineoplastic radiation therapy: Secondary | ICD-10-CM | POA: Diagnosis not present

## 2016-04-20 ENCOUNTER — Ambulatory Visit
Admission: RE | Admit: 2016-04-20 | Discharge: 2016-04-20 | Disposition: A | Discharge status: Home / Self Care | Payer: Medicare Other | Source: Ambulatory Visit | Attending: Radiation Oncology | Admitting: Radiation Oncology

## 2016-04-20 DIAGNOSIS — Z510 Encounter for antineoplastic radiation therapy: Secondary | ICD-10-CM | POA: Diagnosis not present

## 2016-04-21 ENCOUNTER — Ambulatory Visit
Admission: RE | Admit: 2016-04-21 | Discharge: 2016-04-21 | Disposition: A | Discharge status: Home / Self Care | Payer: Medicare Other | Source: Ambulatory Visit | Attending: Radiation Oncology | Admitting: Radiation Oncology

## 2016-04-21 DIAGNOSIS — Z510 Encounter for antineoplastic radiation therapy: Secondary | ICD-10-CM | POA: Diagnosis not present

## 2016-04-25 ENCOUNTER — Ambulatory Visit
Admission: RE | Admit: 2016-04-25 | Discharge: 2016-04-25 | Disposition: A | Discharge status: Home / Self Care | Payer: Medicare Other | Source: Ambulatory Visit | Attending: Radiation Oncology | Admitting: Radiation Oncology

## 2016-04-25 ENCOUNTER — Ambulatory Visit (HOSPITAL_BASED_OUTPATIENT_CLINIC_OR_DEPARTMENT_OTHER): Payer: Medicare Other

## 2016-04-25 ENCOUNTER — Encounter: Payer: Self-pay | Admitting: Radiation Oncology

## 2016-04-25 ENCOUNTER — Other Ambulatory Visit (HOSPITAL_BASED_OUTPATIENT_CLINIC_OR_DEPARTMENT_OTHER): Payer: Medicare Other

## 2016-04-25 VITALS — BP 141/84 | HR 92 | Temp 97.7°F | Resp 18

## 2016-04-25 VITALS — BP 116/71 | HR 90 | Temp 97.8°F | Resp 18 | Wt 180.8 lb

## 2016-04-25 DIAGNOSIS — C3401 Malignant neoplasm of right main bronchus: Secondary | ICD-10-CM

## 2016-04-25 DIAGNOSIS — C3491 Malignant neoplasm of unspecified part of right bronchus or lung: Secondary | ICD-10-CM

## 2016-04-25 DIAGNOSIS — Z5111 Encounter for antineoplastic chemotherapy: Secondary | ICD-10-CM

## 2016-04-25 DIAGNOSIS — Z510 Encounter for antineoplastic radiation therapy: Secondary | ICD-10-CM | POA: Diagnosis not present

## 2016-04-25 LAB — CBC WITH DIFFERENTIAL/PLATELET
BASO%: 0.5 % (ref 0.0–2.0)
BASOS ABS: 0 10*3/uL (ref 0.0–0.1)
EOS ABS: 0.3 10*3/uL (ref 0.0–0.5)
EOS%: 2.6 % (ref 0.0–7.0)
HEMATOCRIT: 38.6 % (ref 38.4–49.9)
HEMOGLOBIN: 13.5 g/dL (ref 13.0–17.1)
LYMPH#: 0.9 10*3/uL (ref 0.9–3.3)
LYMPH%: 9 % — ABNORMAL LOW (ref 14.0–49.0)
MCH: 33 pg (ref 27.2–33.4)
MCHC: 34.9 g/dL (ref 32.0–36.0)
MCV: 94.3 fL (ref 79.3–98.0)
MONO#: 0.9 10*3/uL (ref 0.1–0.9)
MONO%: 8.7 % (ref 0.0–14.0)
NEUT#: 8 10*3/uL — ABNORMAL HIGH (ref 1.5–6.5)
NEUT%: 79.2 % — ABNORMAL HIGH (ref 39.0–75.0)
PLATELETS: 155 10*3/uL (ref 140–400)
RBC: 4.1 10*6/uL — ABNORMAL LOW (ref 4.20–5.82)
RDW: 15.6 % — AB (ref 11.0–14.6)
WBC: 10.1 10*3/uL (ref 4.0–10.3)

## 2016-04-25 LAB — COMPREHENSIVE METABOLIC PANEL
ALBUMIN: 4 g/dL (ref 3.5–5.0)
ALK PHOS: 79 U/L (ref 40–150)
ALT: 21 U/L (ref 0–55)
AST: 24 U/L (ref 5–34)
Anion Gap: 10 mEq/L (ref 3–11)
BUN: 15.9 mg/dL (ref 7.0–26.0)
CALCIUM: 9.1 mg/dL (ref 8.4–10.4)
CO2: 21 mEq/L — ABNORMAL LOW (ref 22–29)
Chloride: 106 mEq/L (ref 98–109)
Creatinine: 1 mg/dL (ref 0.7–1.3)
EGFR: 72 mL/min/{1.73_m2} — AB (ref >90)
Glucose: 122 mg/dl (ref 70–140)
POTASSIUM: 4 meq/L (ref 3.5–5.1)
Sodium: 138 mEq/L (ref 136–145)
Total Bilirubin: 0.42 mg/dL (ref 0.20–1.20)
Total Protein: 7.9 g/dL (ref 6.4–8.3)

## 2016-04-25 MED ORDER — PALONOSETRON HCL INJECTION 0.25 MG/5ML
0.2500 mg | Freq: Once | INTRAVENOUS | Status: AC
  Administered 2016-04-25: 0.25 mg via INTRAVENOUS

## 2016-04-25 MED ORDER — SODIUM CHLORIDE 0.9 % IV SOLN
20.0000 mg | Freq: Once | INTRAVENOUS | Status: AC
  Administered 2016-04-25: 20 mg via INTRAVENOUS
  Filled 2016-04-25: qty 2

## 2016-04-25 MED ORDER — PACLITAXEL CHEMO INJECTION 300 MG/50ML
45.0000 mg/m2 | Freq: Once | INTRAVENOUS | Status: AC
  Administered 2016-04-25: 90 mg via INTRAVENOUS
  Filled 2016-04-25: qty 15

## 2016-04-25 MED ORDER — SODIUM CHLORIDE 0.9 % IV SOLN
180.6000 mg | Freq: Once | INTRAVENOUS | Status: AC
  Administered 2016-04-25: 180 mg via INTRAVENOUS
  Filled 2016-04-25: qty 18

## 2016-04-25 MED ORDER — FAMOTIDINE IN NACL 20-0.9 MG/50ML-% IV SOLN
INTRAVENOUS | Status: AC
  Filled 2016-04-25: qty 50

## 2016-04-25 MED ORDER — FAMOTIDINE IN NACL 20-0.9 MG/50ML-% IV SOLN
20.0000 mg | Freq: Once | INTRAVENOUS | Status: AC
  Administered 2016-04-25: 20 mg via INTRAVENOUS

## 2016-04-25 MED ORDER — SODIUM CHLORIDE 0.9 % IV SOLN
Freq: Once | INTRAVENOUS | Status: AC
  Administered 2016-04-25: 15:00:00 via INTRAVENOUS

## 2016-04-25 MED ORDER — DIPHENHYDRAMINE HCL 50 MG/ML IJ SOLN
50.0000 mg | Freq: Once | INTRAMUSCULAR | Status: AC
  Administered 2016-04-25: 50 mg via INTRAVENOUS

## 2016-04-25 MED ORDER — DIPHENHYDRAMINE HCL 50 MG/ML IJ SOLN
INTRAMUSCULAR | Status: AC
  Filled 2016-04-25: qty 1

## 2016-04-25 MED ORDER — PALONOSETRON HCL INJECTION 0.25 MG/5ML
INTRAVENOUS | Status: AC
  Filled 2016-04-25: qty 5

## 2016-04-25 NOTE — Progress Notes (Addendum)
Weight and vitals stable. Denies pain. Reports an occasional dry cough. Denies hemoptysis. Reports discomfort associated with swallowing is well managed with Carafate. Denies SOB but, admit he doesn't exert himself much anymore due to fatigue. Denies skin changes within treatment field.   BP 116/71 (BP Location: Right Arm, Patient Position: Sitting, Cuff Size: Normal)   Pulse 90   Temp 97.8 F (36.6 C) (Oral)   Resp 18   Wt 180 lb 12.8 oz (82 kg)   SpO2 100%   BMI 24.52 kg/m  Wt Readings from Last 3 Encounters:  04/25/16 180 lb 12.8 oz (82 kg)  04/18/16 182 lb 6.4 oz (82.7 kg)  04/10/16 177 lb (80.3 kg)

## 2016-04-25 NOTE — Patient Instructions (Signed)
[facility] Discharge Instructions for Patients Receiving Chemotherapy  Today you received the following chemotherapy agents Taxol and Carboplatin  To help prevent nausea and vomiting after your treatment, we encourage you to take your nausea medication as directed. No Zofran for 3 days. Take Compazine instead.   If you develop nausea and vomiting that is not controlled by your nausea medication, call the clinic.   BELOW ARE SYMPTOMS THAT SHOULD BE REPORTED IMMEDIATELY:  *FEVER GREATER THAN 100.5 F  *CHILLS WITH OR WITHOUT FEVER  NAUSEA AND VOMITING THAT IS NOT CONTROLLED WITH YOUR NAUSEA MEDICATION  *UNUSUAL SHORTNESS OF BREATH  *UNUSUAL BRUISING OR BLEEDING  TENDERNESS IN MOUTH AND THROAT WITH OR WITHOUT PRESENCE OF ULCERS  *URINARY PROBLEMS  *BOWEL PROBLEMS  UNUSUAL RASH Items with * indicate a potential emergency and should be followed up as soon as possible.  Feel free to call the clinic you have any questions or concerns. The clinic phone number is [phone].  Please show the CHEMO ALERT CARD at check-in to the Emergency Department and triage nurse.   

## 2016-04-25 NOTE — Progress Notes (Signed)
   Weekly Management Note:  Outpatient    ICD-9-CM ICD-10-CM   1. Adenocarcinoma of right lung, stage 2 (HCC) 162.9 C34.91     Current Dose:  12 Gy  Projected Dose: 20 Gy   Narrative:  The patient presents for routine under treatment assessment.  CBCT/MVCT images/Port film x-rays were reviewed.  The chart was checked.   Weight and vitals stable. Denies pain. Reports an occasional dry cough. Denies hemoptysis. Reports discomfort associated with swallowing is well managed with Carafate. Denies SOB but, admit he doesn't exert himself much anymore due to fatigue. Denies skin changes within treatment field.   Physical Findings:  weight is 180 lb 12.8 oz (82 kg). His oral temperature is 97.8 F (36.6 C). His blood pressure is 116/71 and his pulse is 90. His respiration is 18 and oxygen saturation is 100%.   Wt Readings from Last 3 Encounters:  04/25/16 180 lb 12.8 oz (82 kg)  04/18/16 182 lb 6.4 oz (82.7 kg)  04/10/16 177 lb (80.3 kg)   Alert and in no acute distress.   Impression:  The patient is tolerating radiotherapy.  Plan:  Continue radiotherapy as planned.   ________________________________   Eppie Gibson, M.D.   This document serves as a record of services personally performed by Eppie Gibson, MD. It was created on her behalf by Arlyce Harman, a trained medical scribe. The creation of this record is based on the scribe's personal observations and the provider's statements to them. This document has been checked and approved by the attending provider.

## 2016-04-26 ENCOUNTER — Ambulatory Visit
Admission: RE | Admit: 2016-04-26 | Discharge: 2016-04-26 | Disposition: A | Discharge status: Home / Self Care | Payer: Medicare Other | Source: Ambulatory Visit | Attending: Radiation Oncology | Admitting: Radiation Oncology

## 2016-04-26 DIAGNOSIS — Z510 Encounter for antineoplastic radiation therapy: Secondary | ICD-10-CM | POA: Diagnosis not present

## 2016-04-27 ENCOUNTER — Ambulatory Visit
Admission: RE | Admit: 2016-04-27 | Discharge: 2016-04-27 | Disposition: A | Discharge status: Home / Self Care | Payer: Medicare Other | Source: Ambulatory Visit | Attending: Radiation Oncology | Admitting: Radiation Oncology

## 2016-04-27 DIAGNOSIS — Z510 Encounter for antineoplastic radiation therapy: Secondary | ICD-10-CM | POA: Diagnosis not present

## 2016-04-28 ENCOUNTER — Ambulatory Visit
Admission: RE | Admit: 2016-04-28 | Discharge: 2016-04-28 | Disposition: A | Discharge status: Home / Self Care | Payer: Medicare Other | Source: Ambulatory Visit | Attending: Radiation Oncology | Admitting: Radiation Oncology

## 2016-04-28 DIAGNOSIS — Z510 Encounter for antineoplastic radiation therapy: Secondary | ICD-10-CM | POA: Diagnosis not present

## 2016-05-02 ENCOUNTER — Ambulatory Visit
Admission: RE | Admit: 2016-05-02 | Discharge: 2016-05-02 | Disposition: A | Discharge status: Home / Self Care | Payer: Medicare Other | Source: Ambulatory Visit | Attending: Radiation Oncology | Admitting: Radiation Oncology

## 2016-05-02 ENCOUNTER — Encounter: Payer: Self-pay | Admitting: Radiation Oncology

## 2016-05-02 VITALS — BP 144/94 | HR 89 | Temp 97.7°F | Ht 72.0 in | Wt 181.2 lb

## 2016-05-02 DIAGNOSIS — C3491 Malignant neoplasm of unspecified part of right bronchus or lung: Secondary | ICD-10-CM

## 2016-05-02 DIAGNOSIS — Z510 Encounter for antineoplastic radiation therapy: Secondary | ICD-10-CM | POA: Diagnosis not present

## 2016-05-02 NOTE — Progress Notes (Signed)
Mark Howell has completed treatment with 10 fractions to his right lung.  He denies having pain.  He mentioned having constipation Wednesday of last week which has been relieved with taking Miralax.  He reports having shortness of breath in the cold air.  He reports having a dry cough.  He reports having food feel like it is stuck in his chest occasionally.  He takes Carafate as needed.  He does not have any skin irritation in the treatment area.  He reports having fatigue.  He has been given a one month follow appointment.    BP (!) 144/94 (BP Location: Right Arm, Patient Position: Sitting)   Pulse 89   Temp 97.7 F (36.5 C) (Oral)   Ht 6' (1.829 m)   Wt 181 lb 3.2 oz (82.2 kg)   SpO2 100%   BMI 24.58 kg/m    Wt Readings from Last 3 Encounters:  05/02/16 181 lb 3.2 oz (82.2 kg)  04/25/16 180 lb 12.8 oz (82 kg)  04/18/16 182 lb 6.4 oz (82.7 kg)

## 2016-05-02 NOTE — Progress Notes (Signed)
  Radiation Oncology         [phone] ________________________________  Name: Mark Howell MRN: [id number]  Date: 05/02/2016  DOB: [birth year]  Weekly Radiation Therapy Management    ICD-9-CM ICD-10-CM   1. Adenocarcinoma of right lung, stage 2 (HCC) 162.9 C34.91      Current Dose: 20 Gy     Planned Dose:  20 Gy (cumulative - 65 GY)  Narrative . . . . . . . . The patient presents for routine under treatment assessment.                                    Mark Howell has completed treatment with 10 fractions to his right lung.  He denies having pain.  He mentioned having constipation Wednesday of last week which has been relieved with taking Miralax.  He reports having shortness of breath in the cold air.  He reports having a dry cough.  He reports feeling that food gets stuck in his chest occasionally.  He takes Carafate as needed.  He does not have any skin irritation in the treatment area.  He reports having fatigue.  He has been given a one month follow appointment.                                    Set-up films were reviewed.                                 The chart was checked. Physical Findings. . .  height is 6' (1.829 m) and weight is 181 lb 3.2 oz (82.2 kg). His oral temperature is 97.7 F (36.5 C). His blood pressure is 144/94 (abnormal) and his pulse is 89. His oxygen saturation is 100%. .  Weight essentially stable.  Lungs are clear to auscultation bilaterally. Heart has regular rate and rhythm. Impression . . . . . . . The patient has tolerated radiation. Plan . . . . . . . . . . . . The patient has completed treatment and will follow up on 06/16/15. He is scheduled to follow up with Dr. Julien Nordmann on 05/04/16. ________________________________   Blair Promise, PhD, MD  This document serves as a record of services personally performed by Gery Pray, MD. It was created on his behalf by Darcus Austin, a trained medical scribe. The creation of this record is based on the  scribe's personal observations and the provider's statements to them. This document has been checked and approved by the attending provider.

## 2016-05-03 ENCOUNTER — Other Ambulatory Visit: Payer: Self-pay | Admitting: Medical Oncology

## 2016-05-03 ENCOUNTER — Encounter: Payer: Self-pay | Admitting: Radiation Oncology

## 2016-05-03 DIAGNOSIS — C3491 Malignant neoplasm of unspecified part of right bronchus or lung: Secondary | ICD-10-CM

## 2016-05-03 NOTE — Progress Notes (Signed)
  Radiation Oncology         [phone] ________________________________  Name: Mark Howell MRN: [id number]  Date: 05/03/2016  DOB: 1941-05-01  End of Treatment Note  Diagnosis:   Adenocarcinoma of the right lung, stage 2     Indication for treatment:  Curative, not a candidate for surgery after his preoperative dose of radiation therapy    Radiation treatment dates:   04/17/16-05/02/16  Site/dose:   Right lung boost/ 20 Gy in 10 fractions cumulative dose 65 gray  Beams/energy:   3D / 10X , 6X  Narrative: The patient tolerated radiation treatment relatively well. During treatment, the patient complained of shortness of breath in cold air, dry cough, and occasional feeling of food stuck in chest.  Plan: The patient has completed radiation treatment. The patient will return to radiation oncology clinic for routine followup in one month. I advised them to call or return sooner if they have any questions or concerns related to their recovery or treatment.  -----------------------------------  Blair Promise, PhD, MD  This document serves as a record of services personally performed by Gery Pray, MD. It was created on his behalf by Bethann Humble, a trained medical scribe. The creation of this record is based on the scribe's personal observations and the provider's statements to them. This document has been checked and approved by the attending provider.

## 2016-05-04 ENCOUNTER — Other Ambulatory Visit (HOSPITAL_BASED_OUTPATIENT_CLINIC_OR_DEPARTMENT_OTHER): Payer: Medicare Other

## 2016-05-04 ENCOUNTER — Ambulatory Visit (HOSPITAL_BASED_OUTPATIENT_CLINIC_OR_DEPARTMENT_OTHER): Payer: Medicare Other | Admitting: Internal Medicine

## 2016-05-04 ENCOUNTER — Encounter: Payer: Self-pay | Admitting: Internal Medicine

## 2016-05-04 VITALS — BP 92/57 | HR 84 | Temp 98.0°F | Resp 18 | Ht 72.0 in | Wt 181.7 lb

## 2016-05-04 DIAGNOSIS — T451X5A Adverse effect of antineoplastic and immunosuppressive drugs, initial encounter: Secondary | ICD-10-CM

## 2016-05-04 DIAGNOSIS — D6959 Other secondary thrombocytopenia: Secondary | ICD-10-CM | POA: Diagnosis not present

## 2016-05-04 DIAGNOSIS — C3401 Malignant neoplasm of right main bronchus: Secondary | ICD-10-CM

## 2016-05-04 DIAGNOSIS — M069 Rheumatoid arthritis, unspecified: Secondary | ICD-10-CM | POA: Diagnosis not present

## 2016-05-04 DIAGNOSIS — C3491 Malignant neoplasm of unspecified part of right bronchus or lung: Secondary | ICD-10-CM

## 2016-05-04 DIAGNOSIS — D6481 Anemia due to antineoplastic chemotherapy: Secondary | ICD-10-CM | POA: Diagnosis not present

## 2016-05-04 DIAGNOSIS — R066 Hiccough: Secondary | ICD-10-CM

## 2016-05-04 DIAGNOSIS — R1310 Dysphagia, unspecified: Secondary | ICD-10-CM

## 2016-05-04 DIAGNOSIS — J449 Chronic obstructive pulmonary disease, unspecified: Secondary | ICD-10-CM

## 2016-05-04 LAB — CBC WITH DIFFERENTIAL/PLATELET
BASO%: 0.6 % (ref 0.0–2.0)
BASOS ABS: 0 10*3/uL (ref 0.0–0.1)
EOS%: 0.6 % (ref 0.0–7.0)
Eosinophils Absolute: 0 10*3/uL (ref 0.0–0.5)
HEMATOCRIT: 37.3 % — AB (ref 38.4–49.9)
HEMOGLOBIN: 12.8 g/dL — AB (ref 13.0–17.1)
LYMPH%: 15.7 % (ref 14.0–49.0)
MCH: 32.5 pg (ref 27.2–33.4)
MCHC: 34.4 g/dL (ref 32.0–36.0)
MCV: 94.5 fL (ref 79.3–98.0)
MONO#: 0.5 10*3/uL (ref 0.1–0.9)
MONO%: 8.2 % (ref 0.0–14.0)
NEUT#: 5 10*3/uL (ref 1.5–6.5)
NEUT%: 74.9 % (ref 39.0–75.0)
Platelets: 138 10*3/uL — ABNORMAL LOW (ref 140–400)
RBC: 3.95 10*6/uL — ABNORMAL LOW (ref 4.20–5.82)
RDW: 14.6 % (ref 11.0–14.6)
WBC: 6.7 10*3/uL (ref 4.0–10.3)
lymph#: 1 10*3/uL (ref 0.9–3.3)

## 2016-05-04 LAB — COMPREHENSIVE METABOLIC PANEL
ALBUMIN: 3.9 g/dL (ref 3.5–5.0)
ALK PHOS: 83 U/L (ref 40–150)
ALT: 18 U/L (ref 0–55)
AST: 22 U/L (ref 5–34)
Anion Gap: 11 mEq/L (ref 3–11)
BILIRUBIN TOTAL: 0.52 mg/dL (ref 0.20–1.20)
BUN: 14.5 mg/dL (ref 7.0–26.0)
CALCIUM: 9.1 mg/dL (ref 8.4–10.4)
CO2: 22 mEq/L (ref 22–29)
Chloride: 105 mEq/L (ref 98–109)
Creatinine: 1.2 mg/dL (ref 0.7–1.3)
EGFR: 60 mL/min/{1.73_m2} — AB (ref >90)
Glucose: 158 mg/dl — ABNORMAL HIGH (ref 70–140)
POTASSIUM: 4.5 meq/L (ref 3.5–5.1)
Sodium: 137 mEq/L (ref 136–145)
Total Protein: 7.3 g/dL (ref 6.4–8.3)

## 2016-05-04 NOTE — Progress Notes (Signed)
[facility] [phone]   [phone]  OFFICE PROGRESS NOTE  Sherrie Mustache, MD [place] [address]  DIAGNOSIS: Unresectable a stage IIA (T1a, N1, M0) non-small cell lung cancer, adenocarcinoma presented with a right hilar mass and right hilar adenopathy diagnosed in September 2017.  PRIOR THERAPY: Status post concurrent chemoradiation with weekly carboplatin for AUC of 2 and paclitaxel 45 MG/M2 for 7 weeks.  CURRENT THERAPY: None  INTERVAL HISTORY: Mark Howell 76 y.o. male returns to the clinic today for follow-up visit accompanied by his wife. The patient is feeling fine today with no specific complaints except for some odynophagia and dysphagia especially to solid food as well as hiccups. He completed the last 2 weeks of his concurrent chemoradiation with last fraction of radiotherapy on 05/02/2016. He denied having any chest pain, shortness of breath, cough or hemoptysis. He has no nausea, vomiting, diarrhea or constipation. He denied having any weight loss or night sweats. He is here today for evaluation and discussion of his treatment options.  MEDICAL HISTORY: Past Medical History:  Diagnosis Date  . Arthritis   . Complication of anesthesia    problems voiding post op  . Full dentures   . GERD (gastroesophageal reflux disease)   . History of radiation therapy 01/25/16-02/28/16   right lung 45 Gy  . HOH (hard of hearing)   . HTN (hypertension)   . Hyperlipidemia   . Lung mass 12/30/2015  . Odynophagia 04/06/2016  . Psoriatic arthritis ([place])   . Snores   . Wears glasses     ALLERGIES:  is allergic to hydrocodone and oxycodone.  MEDICATIONS:  Current Outpatient Prescriptions  Medication Sig Dispense Refill  . acetaminophen (TYLENOL) 500 MG tablet Take 500-1,000 mg by mouth 2 (two) times daily as needed for mild pain.    Marland Kitchen albuterol (PROVENTIL HFA;VENTOLIN HFA) 108 (90 Base) MCG/ACT inhaler Inhale 1-2  puffs into the lungs every 6 (six) hours as needed for wheezing or shortness of breath (Wheeze). 1 Inhaler 2  . aspirin EC 81 MG tablet Take 81 mg by mouth at bedtime.     Marland Kitchen aspirin-sod bicarb-citric acid (ALKA-SELTZER) 325 MG TBEF tablet Take 325 mg by mouth every 6 (six) hours as needed (indigestion).    . benzonatate (TESSALON) 100 MG capsule Take 100 mg by mouth 3 (three) times daily as needed for cough.    . clotrimazole-betamethasone (LOTRISONE) cream Apply 1 application topically 2 (two) times daily.    Marland Kitchen dextromethorphan (DELSYM) 30 MG/5ML liquid Take 30 mg by mouth 2 (two) times daily as needed for cough.    . diphenhydramine-acetaminophen (TYLENOL PM) 25-500 MG TABS Take 1 tablet by mouth at bedtime. For sleep    . ENBREL SURECLICK 50 MG/ML injection Inject 50 mg into the skin once a week. Tuesday    . guaiFENesin-dextromethorphan (ROBITUSSIN DM) 100-10 MG/5ML syrup Take 5 mLs by mouth every 4 (four) hours as needed for cough. (Patient not taking: Reported on 05/02/2016) 118 mL 0  . Multiple Vitamin (MULITIVITAMIN WITH MINERALS) TABS Take 1 tablet by mouth daily.    . pantoprazole (PROTONIX) 40 MG tablet Take 40 mg by mouth daily.     . polyethylene glycol (MIRALAX / GLYCOLAX) packet Take 17 g by mouth daily.    . pravastatin (PRAVACHOL) 40 MG tablet Take 40 mg by mouth at bedtime.     . predniSONE (DELTASONE) 10 MG tablet Take 5 mg by mouth every other  day.     . prochlorperazine (COMPAZINE) 10 MG tablet Take 1 tablet (10 mg total) by mouth every 6 (six) hours as needed for nausea or vomiting. 30 tablet 0  . sucralfate (CARAFATE) 1 g tablet Take 1 tablet (1 g total) by mouth 4 (four) times daily -  with meals and at bedtime. 120 tablet 0  . tamsulosin (FLOMAX) 0.4 MG CAPS capsule TAKE (1) CAPSULE DAILY, START 4 DAYS BEFORE PROCEDURE (Patient taking differently: take 1 capsule by mouth every morning) 30 capsule 2  . Wound Dressings (SONAFINE) Apply 1 application topically 2 (two) times  daily.     No current facility-administered medications for this visit.     SURGICAL HISTORY:  Past Surgical History:  Procedure Laterality Date  . COLONOSCOPY    . HEMORRHOID SURGERY N/A 10/01/2013   Procedure: EXAM UNDER ANESTHESIA  AND EXCISIONAL HEMORRHOIDECTOMY WITH HEMORRHOID BANDING X 2;  Surgeon: Gayland Curry, MD;  Location: [facility];  Service: General;  Laterality: N/A;  . INGUINAL HERNIA REPAIR  01/04/2012   Procedure: LAPAROSCOPIC INGUINAL HERNIA;  Surgeon: Gayland Curry, MD,FACS;  Location: [facility];  Service: General;  Laterality: Right;  Marland Kitchen VIDEO BRONCHOSCOPY WITH ENDOBRONCHIAL ULTRASOUND N/A 12/31/2015   Procedure: VIDEO BRONCHOSCOPY WITH ENDOBRONCHIAL ULTRASOUND transbronchial biopsy of node 10 R lymph node;  Surgeon: Grace Isaac, MD;  Location: [facility];  Service: Thoracic;  Laterality: N/A;    REVIEW OF SYSTEMS:  Constitutional: positive for fatigue Eyes: negative Ears, nose, mouth, throat, and face: negative Respiratory: negative Cardiovascular: negative Gastrointestinal: positive for dysphagia, odynophagia and Hiccups Genitourinary:negative Integument/breast: negative Hematologic/lymphatic: negative Musculoskeletal:negative Neurological: negative Behavioral/Psych: negative Endocrine: negative Allergic/Immunologic: negative   PHYSICAL EXAMINATION: General appearance: alert, cooperative and no distress Head: Normocephalic, without obvious abnormality, atraumatic Neck: no adenopathy, no JVD, supple, symmetrical, trachea midline and thyroid not enlarged, symmetric, no tenderness/mass/nodules Lymph nodes: Cervical, supraclavicular, and axillary nodes normal. Resp: clear to auscultation bilaterally Back: symmetric, no curvature. ROM normal. No CVA tenderness. Cardio: regular rate and rhythm, S1, S2 normal, no murmur, click, rub or gallop GI: soft, non-tender; bowel sounds normal; no masses,  no organomegaly Extremities: extremities normal, atraumatic,  no cyanosis or edema Neurologic: Alert and oriented X 3, normal strength and tone. Normal symmetric reflexes. Normal coordination and gait  ECOG PERFORMANCE STATUS: 1 - Symptomatic but completely ambulatory  Blood pressure (!) 92/57, pulse 84, temperature 98 F (36.7 C), temperature source Oral, resp. rate 18, height 6' (1.829 m), weight 181 lb 11.2 oz (82.4 kg), SpO2 99 %.  LABORATORY DATA: Lab Results  Component Value Date   WBC 6.7 05/04/2016   HGB 12.8 (L) 05/04/2016   HCT 37.3 (L) 05/04/2016   MCV 94.5 05/04/2016   PLT 138 (L) 05/04/2016      Chemistry      Component Value Date/Time   NA 137 05/04/2016 1105   K 4.5 05/04/2016 1105   CL 99 (L) 03/05/2016 1344   CO2 22 05/04/2016 1105   BUN 14.5 05/04/2016 1105   CREATININE 1.2 05/04/2016 1105      Component Value Date/Time   CALCIUM 9.1 05/04/2016 1105   ALKPHOS 83 05/04/2016 1105   AST 22 05/04/2016 1105   ALT 18 05/04/2016 1105   BILITOT 0.52 05/04/2016 1105       RADIOGRAPHIC STUDIES: No results found.  ASSESSMENT AND PLAN: This is a very pleasant 76 years old white male with unresectable a stage II a non-small cell lung cancer status post  a course of concurrent chemoradiation. He has imaging studies after 5 weeks of the treatment and he was seen by Dr. Servando Snare for consideration of surgical resection but he was not a good candidate for resection as he may require right pneumonectomy which would be risky with his current pulmonary function status. The patient completed 2 more weeks of concurrent chemoradiation. He is feeling fine except for odynophagia, mild dysphagia as well as hiccups. I had a lengthy discussion with the patient and his wife today. I recommended for him to continue on observation for now with repeat CT scan of the chest for restaging of his disease. The patient on the scan results the patient may be considered for consolidation chemotherapy or immunotherapy. For the odynophagia and dysphagia, he  will continue on Carafate. For the dry cough with the patient will continue on Tessalon pearls. For the history of rheumatoid arthritis, the patient is currently on Enbrel. This needs to be addressed in the future if the patient is considered for treatment with immunotherapy. For COPD, the patient will continue on albuterol. For the chemotherapy-induced anemia and thrombocytopenia, we will continue to monitor his blood count closely on upcoming blood work. He was advised to call immediately if he has any concerning symptoms in the interval. The patient voices understanding of current disease status and treatment options and is in agreement with the current care plan.  All questions were answered. The patient knows to call the clinic with any problems, questions or concerns. We can certainly see the patient much sooner if necessary.  Disclaimer: This note was dictated with voice recognition software. Similar sounding words can inadvertently be transcribed and may not be corrected upon review.

## 2016-05-09 ENCOUNTER — Telehealth: Payer: Self-pay | Admitting: Internal Medicine

## 2016-05-09 NOTE — Telephone Encounter (Signed)
I spoke with Mark Howell about the February appointments

## 2016-06-02 ENCOUNTER — Ambulatory Visit (HOSPITAL_COMMUNITY)
Admission: RE | Admit: 2016-06-02 | Discharge: 2016-06-02 | Disposition: A | Discharge status: Home / Self Care | Payer: Medicare Other | Source: Ambulatory Visit | Attending: Internal Medicine | Admitting: Internal Medicine

## 2016-06-02 ENCOUNTER — Other Ambulatory Visit (HOSPITAL_BASED_OUTPATIENT_CLINIC_OR_DEPARTMENT_OTHER): Payer: Medicare Other

## 2016-06-02 DIAGNOSIS — D6481 Anemia due to antineoplastic chemotherapy: Secondary | ICD-10-CM

## 2016-06-02 DIAGNOSIS — C3491 Malignant neoplasm of unspecified part of right bronchus or lung: Secondary | ICD-10-CM | POA: Diagnosis not present

## 2016-06-02 DIAGNOSIS — I2510 Atherosclerotic heart disease of native coronary artery without angina pectoris: Secondary | ICD-10-CM | POA: Insufficient documentation

## 2016-06-02 DIAGNOSIS — R1310 Dysphagia, unspecified: Secondary | ICD-10-CM | POA: Diagnosis not present

## 2016-06-02 DIAGNOSIS — I700 Atherosclerosis of aorta: Secondary | ICD-10-CM | POA: Insufficient documentation

## 2016-06-02 DIAGNOSIS — C3401 Malignant neoplasm of right main bronchus: Secondary | ICD-10-CM

## 2016-06-02 DIAGNOSIS — R590 Localized enlarged lymph nodes: Secondary | ICD-10-CM | POA: Diagnosis not present

## 2016-06-02 DIAGNOSIS — N200 Calculus of kidney: Secondary | ICD-10-CM | POA: Diagnosis not present

## 2016-06-02 DIAGNOSIS — T451X5A Adverse effect of antineoplastic and immunosuppressive drugs, initial encounter: Secondary | ICD-10-CM

## 2016-06-02 LAB — CBC WITH DIFFERENTIAL/PLATELET
BASO%: 0.2 % (ref 0.0–2.0)
BASOS ABS: 0 10*3/uL (ref 0.0–0.1)
EOS ABS: 0.2 10*3/uL (ref 0.0–0.5)
EOS%: 3.4 % (ref 0.0–7.0)
HEMATOCRIT: 37.2 % — AB (ref 38.4–49.9)
HEMOGLOBIN: 13.4 g/dL (ref 13.0–17.1)
LYMPH#: 1.4 10*3/uL (ref 0.9–3.3)
LYMPH%: 23.5 % (ref 14.0–49.0)
MCH: 32.4 pg (ref 27.2–33.4)
MCHC: 36 g/dL (ref 32.0–36.0)
MCV: 90.1 fL (ref 79.3–98.0)
MONO#: 0.9 10*3/uL (ref 0.1–0.9)
MONO%: 14.5 % — ABNORMAL HIGH (ref 0.0–14.0)
NEUT#: 3.6 10*3/uL (ref 1.5–6.5)
NEUT%: 58.4 % (ref 39.0–75.0)
PLATELETS: 96 10*3/uL — AB (ref 140–400)
RBC: 4.13 10*6/uL — ABNORMAL LOW (ref 4.20–5.82)
RDW: 14.2 % (ref 11.0–14.6)
WBC: 6.1 10*3/uL (ref 4.0–10.3)

## 2016-06-02 LAB — COMPREHENSIVE METABOLIC PANEL
ALBUMIN: 4 g/dL (ref 3.5–5.0)
ALK PHOS: 71 U/L (ref 40–150)
ALT: 19 U/L (ref 0–55)
AST: 22 U/L (ref 5–34)
Anion Gap: 8 mEq/L (ref 3–11)
BILIRUBIN TOTAL: 0.45 mg/dL (ref 0.20–1.20)
BUN: 12.6 mg/dL (ref 7.0–26.0)
CALCIUM: 9.4 mg/dL (ref 8.4–10.4)
CO2: 26 mEq/L (ref 22–29)
Chloride: 103 mEq/L (ref 98–109)
Creatinine: 1.2 mg/dL (ref 0.7–1.3)
EGFR: 61 mL/min/{1.73_m2} — ABNORMAL LOW (ref >90)
Glucose: 119 mg/dl (ref 70–140)
POTASSIUM: 4.2 meq/L (ref 3.5–5.1)
Sodium: 137 mEq/L (ref 136–145)
Total Protein: 7.1 g/dL (ref 6.4–8.3)

## 2016-06-02 MED ORDER — IOPAMIDOL (ISOVUE-300) INJECTION 61%
75.0000 mL | Freq: Once | INTRAVENOUS | Status: AC | PRN
  Administered 2016-06-02: 75 mL via INTRAVENOUS

## 2016-06-02 MED ORDER — SODIUM CHLORIDE 0.9 % IJ SOLN
INTRAMUSCULAR | Status: AC
  Filled 2016-06-02: qty 50

## 2016-06-02 MED ORDER — IOPAMIDOL (ISOVUE-300) INJECTION 61%
INTRAVENOUS | Status: AC
  Filled 2016-06-02: qty 75

## 2016-06-07 ENCOUNTER — Ambulatory Visit (HOSPITAL_BASED_OUTPATIENT_CLINIC_OR_DEPARTMENT_OTHER): Payer: Medicare Other | Admitting: Internal Medicine

## 2016-06-07 ENCOUNTER — Telehealth: Payer: Self-pay | Admitting: Internal Medicine

## 2016-06-07 ENCOUNTER — Encounter: Payer: Self-pay | Admitting: Internal Medicine

## 2016-06-07 VITALS — BP 133/86 | HR 94 | Temp 98.6°F | Resp 16 | Ht 72.0 in | Wt 184.2 lb

## 2016-06-07 DIAGNOSIS — J449 Chronic obstructive pulmonary disease, unspecified: Secondary | ICD-10-CM | POA: Diagnosis not present

## 2016-06-07 DIAGNOSIS — Z5111 Encounter for antineoplastic chemotherapy: Secondary | ICD-10-CM

## 2016-06-07 DIAGNOSIS — C3491 Malignant neoplasm of unspecified part of right bronchus or lung: Secondary | ICD-10-CM

## 2016-06-07 DIAGNOSIS — J700 Acute pulmonary manifestations due to radiation: Secondary | ICD-10-CM

## 2016-06-07 DIAGNOSIS — C3401 Malignant neoplasm of right main bronchus: Secondary | ICD-10-CM | POA: Diagnosis not present

## 2016-06-07 DIAGNOSIS — R05 Cough: Secondary | ICD-10-CM | POA: Diagnosis not present

## 2016-06-07 NOTE — Progress Notes (Signed)
[facility] [phone]   [phone]  OFFICE PROGRESS NOTE  Sherrie Mustache, MD [place] [address]  DIAGNOSIS: Unresectable a stage IIA (T1a, N1, M0) non-small cell lung cancer, adenocarcinoma presented with a right hilar mass and right hilar adenopathy diagnosed in September 2017.  PRIOR THERAPY: Status post concurrent chemoradiation with weekly carboplatin for AUC of 2 and paclitaxel 45 MG/M2 for 7 weeks.  CURRENT THERAPY: None  INTERVAL HISTORY: Mark Howell 76 y.o. male came to the clinic today for follow-up visit accompanied by his wife and son. The patient is feeling fine today except for mild fatigue as well as shortness breath with exertion and dry cough. He completed the last 2 weeks of concurrent chemoradiation with weekly carboplatin and paclitaxel and tolerated his treatment well. He denied having any dysphagia or odynophagia. He has no significant chest pain or hemoptysis. He gained few pounds since his last visit. The patient denied having any significant nausea, vomiting, diarrhea or constipation. He had repeat CT scan of the chest performed recently and he is here for evaluation and discussion of his scan results.  MEDICAL HISTORY: Past Medical History:  Diagnosis Date  . Arthritis   . Complication of anesthesia    problems voiding post op  . Full dentures   . GERD (gastroesophageal reflux disease)   . History of radiation therapy 01/25/16-02/28/16   right lung 45 Gy  . HOH (hard of hearing)   . HTN (hypertension)   . Hyperlipidemia   . Lung mass 12/30/2015  . Odynophagia 04/06/2016  . Psoriatic arthritis ([place])   . Snores   . Wears glasses     ALLERGIES:  is allergic to hydrocodone and oxycodone.  MEDICATIONS:  Current Outpatient Prescriptions  Medication Sig Dispense Refill  . acetaminophen (TYLENOL) 500 MG tablet Take 500-1,000 mg by mouth 2 (two) times daily as needed for mild pain.    Marland Kitchen  aspirin EC 81 MG tablet Take 81 mg by mouth at bedtime.     Marland Kitchen aspirin-sod bicarb-citric acid (ALKA-SELTZER) 325 MG TBEF tablet Take 325 mg by mouth every 6 (six) hours as needed (indigestion).    . clotrimazole-betamethasone (LOTRISONE) cream Apply 1 application topically 2 (two) times daily.    . diphenhydramine-acetaminophen (TYLENOL PM) 25-500 MG TABS Take 1 tablet by mouth at bedtime. For sleep    . ENBREL SURECLICK 50 MG/ML injection Inject 50 mg into the skin once a week. Tuesday    . guaiFENesin-dextromethorphan (ROBITUSSIN DM) 100-10 MG/5ML syrup Take 5 mLs by mouth every 4 (four) hours as needed for cough. (Patient not taking: Reported on 05/02/2016) 118 mL 0  . Multiple Vitamin (MULITIVITAMIN WITH MINERALS) TABS Take 1 tablet by mouth daily.    . pantoprazole (PROTONIX) 40 MG tablet Take 40 mg by mouth daily.     . polyethylene glycol (MIRALAX / GLYCOLAX) packet Take 17 g by mouth daily.    . pravastatin (PRAVACHOL) 40 MG tablet Take 40 mg by mouth at bedtime.     . predniSONE (DELTASONE) 10 MG tablet Take 5 mg by mouth every other day.     . prochlorperazine (COMPAZINE) 10 MG tablet Take 1 tablet (10 mg total) by mouth every 6 (six) hours as needed for nausea or vomiting. 30 tablet 0  . sucralfate (CARAFATE) 1 g tablet Take 1 tablet (1 g total) by mouth 4 (four) times daily -  with meals and at bedtime. 120 tablet 0  .  tamsulosin (FLOMAX) 0.4 MG CAPS capsule TAKE (1) CAPSULE DAILY, START 4 DAYS BEFORE PROCEDURE (Patient taking differently: take 1 capsule by mouth every morning) 30 capsule 2  . Wound Dressings (SONAFINE) Apply 1 application topically 2 (two) times daily.     No current facility-administered medications for this visit.     SURGICAL HISTORY:  Past Surgical History:  Procedure Laterality Date  . COLONOSCOPY    . HEMORRHOID SURGERY N/A 10/01/2013   Procedure: EXAM UNDER ANESTHESIA  AND EXCISIONAL HEMORRHOIDECTOMY WITH HEMORRHOID BANDING X 2;  Surgeon: Gayland Curry, MD;   Location: [facility];  Service: General;  Laterality: N/A;  . INGUINAL HERNIA REPAIR  01/04/2012   Procedure: LAPAROSCOPIC INGUINAL HERNIA;  Surgeon: Gayland Curry, MD,FACS;  Location[facility];  Service: General;  Laterality: Right;  Marland Kitchen VIDEO BRONCHOSCOPY WITH ENDOBRONCHIAL ULTRASOUND N/A 12/31/2015   Procedure: VIDEO BRONCHOSCOPY WITH ENDOBRONCHIAL ULTRASOUND transbronchial biopsy of node 10 R lymph node;  Surgeon: Grace Isaac, MD;  Location: [facility];  Service: Thoracic;  Laterality: N/A;    REVIEW OF SYSTEMS:  Constitutional: positive for fatigue Eyes: negative Ears, nose, mouth, throat, and face: negative Respiratory: positive for cough and dyspnea on exertion Cardiovascular: negative Gastrointestinal: negative Genitourinary:negative Integument/breast: negative Hematologic/lymphatic: negative Musculoskeletal:negative Neurological: negative Behavioral/Psych: negative Endocrine: negative Allergic/Immunologic: negative   PHYSICAL EXAMINATION: General appearance: alert, cooperative and no distress Head: Normocephalic, without obvious abnormality, atraumatic Neck: no adenopathy, no JVD, supple, symmetrical, trachea midline and thyroid not enlarged, symmetric, no tenderness/mass/nodules Lymph nodes: Cervical, supraclavicular, and axillary nodes normal. Resp: clear to auscultation bilaterally Back: symmetric, no curvature. ROM normal. No CVA tenderness. Cardio: regular rate and rhythm, S1, S2 normal, no murmur, click, rub or gallop GI: soft, non-tender; bowel sounds normal; no masses,  no organomegaly Extremities: extremities normal, atraumatic, no cyanosis or edema Neurologic: Alert and oriented X 3, normal strength and tone. Normal symmetric reflexes. Normal coordination and gait  ECOG PERFORMANCE STATUS: 1 - Symptomatic but completely ambulatory  Blood pressure 133/86, pulse 94, temperature 98.6 F (37 C), temperature source Oral, resp. rate 16, height 6' (1.829 m),  weight 184 lb 3.2 oz (83.6 kg), SpO2 96 %.  LABORATORY DATA: Lab Results  Component Value Date   WBC 6.1 06/02/2016   HGB 13.4 06/02/2016   HCT 37.2 (L) 06/02/2016   MCV 90.1 06/02/2016   PLT 96 (L) 06/02/2016      Chemistry      Component Value Date/Time   NA 137 06/02/2016 1058   K 4.2 06/02/2016 1058   CL 99 (L) 03/05/2016 1344   CO2 26 06/02/2016 1058   BUN 12.6 06/02/2016 1058   CREATININE 1.2 06/02/2016 1058      Component Value Date/Time   CALCIUM 9.4 06/02/2016 1058   ALKPHOS 71 06/02/2016 1058   AST 22 06/02/2016 1058   ALT 19 06/02/2016 1058   BILITOT 0.45 06/02/2016 1058       RADIOGRAPHIC STUDIES: Ct Chest W Contrast  Result Date: 06/02/2016 CLINICAL DATA:  Restaging lung cancer EXAM: CT CHEST WITH CONTRAST TECHNIQUE: Multidetector CT imaging of the chest was performed during intravenous contrast administration. CONTRAST:  57m ISOVUE-300 IOPAMIDOL (ISOVUE-300) INJECTION 61% COMPARISON:  04/03/2016 FINDINGS: Cardiovascular: The heart size appears normal. Aortic atherosclerosis noted. Calcifications involving the LAD and RCA coronary artery identified. There is no pericardial effusion identified. Mediastinum/Nodes: The trachea appears patent and is midline. Normal appearance of the esophagus. Right hilar node measures 2.1 x 1.1 cm, image 67 of series  2. On the previous exam this measure 2.1 x 1.2 cm. The adjacent node measures 7 mm, image 62 of series 2. Previously 1.1 cm. No new or enlarging mediastinal or hilar lymph nodes identified. Lungs/Pleura: There is no pleural fluid. Moderate to advanced changes of centrilobular emphysema identified. Diffuse bronchial wall thickening noted. Progressive changes of external beam radiation are identified throughout the paramediastinal right lung. The index right middle lobe pulmonary nodule is stable measuring 5 mm, image 72 of series 5. Lateral right middle lobe lung nodule is stable measuring 7 mm, image number 86 of series 5.  Upper Abdomen: 4 mm stone is identified within the upper pole the right kidney. No acute abnormality identified. The adrenal glands appear normal Musculoskeletal: There is no aggressive lytic or sclerotic bone lesions identified. IMPRESSION: 1. Progressive changes of external beam radiation are identified within the paramediastinal right lung. 2. Stable right hilar adenopathy/mass. 3. No new sites of disease identified 4. Similar pulmonary nodularity within the right lung. 5. Aortic atherosclerosis and coronary artery calcifications. 6. Right renal calculus. Electronically Signed   By: Kerby Moors M.D.   On: 06/02/2016 14:43    ASSESSMENT AND PLAN:  This is a very pleasant 76 years old white male with unresectable stage IIA non-small cell lung cancer, adenocarcinoma presented with right hilar mass status post concurrent chemoradiation with weekly carboplatin and paclitaxel for 7 weeks. 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 He denied having any current dysphagia or odynophagia. He had repeat CT scan of the chest performed recently. I personally and independently reviewed the scan images and discuss the results with the patient and his family and showed the images of the scans. His scan showed improvement in the right hilar mass compared to the pretreatment scan. He also has radiation changes in the right paramediastinal area. I had a lengthy discussion with the patient today about his current condition and treatment options. I explained to the patient that the option for consolidation chemotherapy is usually appropriate for patient with a stage IIIa lung cancer. I also explained to the patient that immunotherapy is not approved yet as a consolidation option for patient who were treated with concurrent chemoradiation. I recommended for the patient to continue on observation for now with repeat CT scan of the chest in 3 months for  restaging of his disease. If the patient developed any evidence for disease progression or recurrence in the future, he would be considered for treatment with either chemotherapy or immunotherapy depending on his condition. The patient and his family agreed to the current plan. For the dry cough he will continue on his current treatment with over-the-counter cough medication. For COPD, he will continue on albuterol. The patient was advised to call immediately if he has any concerning symptoms in the interval. The patient voices understanding of current disease status and treatment options and is in agreement with the current care plan.  All questions were answered. The patient knows to call the clinic with any problems, questions or concerns. We can certainly see the patient much sooner if necessary.  Disclaimer: This note was dictated with voice recognition software. Similar sounding words can inadvertently be transcribed and may not be corrected upon review.

## 2016-06-07 NOTE — Telephone Encounter (Signed)
Appointments scheduled per 06/07/16 los. Patient was given a copy of the AVS report and appointment schedule, per 06/07/16 los.

## 2016-06-14 ENCOUNTER — Encounter: Payer: Self-pay | Admitting: Oncology

## 2016-06-15 ENCOUNTER — Telehealth: Payer: Self-pay | Admitting: *Deleted

## 2016-06-15 ENCOUNTER — Ambulatory Visit
Admission: RE | Admit: 2016-06-15 | Discharge: 2016-06-15 | Disposition: A | Discharge status: Home / Self Care | Payer: Medicare Other | Source: Ambulatory Visit | Attending: Radiation Oncology | Admitting: Radiation Oncology

## 2016-06-15 ENCOUNTER — Encounter: Payer: Self-pay | Admitting: Radiation Oncology

## 2016-06-15 DIAGNOSIS — C3491 Malignant neoplasm of unspecified part of right bronchus or lung: Secondary | ICD-10-CM | POA: Diagnosis not present

## 2016-06-15 DIAGNOSIS — Y842 Radiological procedure and radiotherapy as the cause of abnormal reaction of the patient, or of later complication, without mention of misadventure at the time of the procedure: Secondary | ICD-10-CM | POA: Insufficient documentation

## 2016-06-15 NOTE — Progress Notes (Signed)
Mark Howell is here for follow up.  He denies having pain.  He reports having an occasional dry cough.  He denies having shortness of breath.  He does report that when he tries to take a deep breath, he is only able to inhale to a certain point and starts to have burning.  He reports having a dry throat occasionally and takes carafate as needed to help him swallow.  He denies having fatigue.  The skin on his right chest is intact.  BP (!) 126/91 (BP Location: Left Arm, Patient Position: Sitting)   Pulse 77   Temp 97.9 F (36.6 C) (Oral)   Ht 6' (1.829 m)   Wt 188 lb (85.3 kg)   SpO2 98%   BMI 25.50 kg/m    Wt Readings from Last 3 Encounters:  06/15/16 188 lb (85.3 kg)  06/07/16 184 lb 3.2 oz (83.6 kg)  05/04/16 181 lb 11.2 oz (82.4 kg)

## 2016-06-15 NOTE — Telephone Encounter (Signed)
CALLED PATIENT TO INFORM OF FU ON 09-07-16 @ 9:30 AM WITH DR. KINARD, LVM FOR A RETURN CALL

## 2016-06-15 NOTE — Progress Notes (Signed)
Radiation Oncology         [phone] ________________________________  Name: Mark Howell MRN: [id number]  Date: 06/15/2016  DOB: Oct 02, 1940  ll  Follow-Up Visit Note  CC: Sherrie Mustache, MD  [name], MD    ICD-9-CM ICD-10-CM   1. Adenocarcinoma of right lung, stage 2 (HCC) 162.9 C34.91     Diagnosis:   Adenocarcinoma of the right lung, stage 2  Interval Since Last Radiation:  6 weeks, 04/17/16-05/02/16 20 Gy in 10 fractions cumulative dose 65 Gy Right lung boost  Narrative:  The patient returns today for routine follow-up.  He denies having pain. He reports having an occasional dry cough. He denies having shortness of breath. He does report that when he tries to take a deep breat, he is only able to inhale to a certain point and starts to have burning. He reports having a dry throat occasionally and takes carafate as needed to help him swallow. He denies having fatigue. The skin on his chest is intact.                               ALLERGIES:  is allergic to hydrocodone and oxycodone.  Meds: Current Outpatient Prescriptions  Medication Sig Dispense Refill  . acetaminophen (TYLENOL) 500 MG tablet Take 500-1,000 mg by mouth 2 (two) times daily as needed for mild pain.    Marland Kitchen aspirin EC 81 MG tablet Take 81 mg by mouth at bedtime.     . diphenhydramine-acetaminophen (TYLENOL PM) 25-500 MG TABS Take 1 tablet by mouth at bedtime. For sleep    . ENBREL SURECLICK 50 MG/ML injection Inject 50 mg into the skin once a week. Tuesday    . Multiple Vitamin (MULITIVITAMIN WITH MINERALS) TABS Take 1 tablet by mouth daily.    . pantoprazole (PROTONIX) 40 MG tablet Take 40 mg by mouth daily.     . pravastatin (PRAVACHOL) 40 MG tablet Take 40 mg by mouth at bedtime.     . predniSONE (DELTASONE) 10 MG tablet Take 5 mg by mouth every other day.     . sucralfate (CARAFATE) 1 g tablet Take 1 tablet (1 g total) by mouth 4 (four) times daily -  with meals and at bedtime. 120 tablet 0    . tamsulosin (FLOMAX) 0.4 MG CAPS capsule TAKE (1) CAPSULE DAILY, START 4 DAYS BEFORE PROCEDURE (Patient taking differently: take 1 capsule by mouth every morning) 30 capsule 2  . aspirin-sod bicarb-citric acid (ALKA-SELTZER) 325 MG TBEF tablet Take 325 mg by mouth every 6 (six) hours as needed (indigestion).    . clotrimazole-betamethasone (LOTRISONE) cream Apply 1 application topically 2 (two) times daily.    . polyethylene glycol (MIRALAX / GLYCOLAX) packet Take 17 g by mouth daily.    . prochlorperazine (COMPAZINE) 10 MG tablet Take 1 tablet (10 mg total) by mouth every 6 (six) hours as needed for nausea or vomiting. (Patient not taking: Reported on 06/07/2016) 30 tablet 0  . Wound Dressings (SONAFINE) Apply 1 application topically 2 (two) times daily.     No current facility-administered medications for this encounter.     Physical Findings: The patient is in no acute distress. Patient is alert and oriented.  height is 6' (1.829 m) and weight is 188 lb (85.3 kg). His oral temperature is 97.9 F (36.6 C). His blood pressure is 126/91 (abnormal) and his pulse is 77. His oxygen saturation is 98%. Marland Kitchen  Lungs are clear to auscultation bilaterally. Heart has regular rate and rhythm. No palpable cervical, supraclavicular, or axillary adenopathy. Abdomen soft, non-tender, normal bowel sounds.    Lab Findings: Lab Results  Component Value Date   WBC 6.1 06/02/2016   HGB 13.4 06/02/2016   HCT 37.2 (L) 06/02/2016   MCV 90.1 06/02/2016   PLT 96 (L) 06/02/2016    Radiographic Findings: Ct Chest W Contrast  Result Date: 06/02/2016 CLINICAL DATA:  Restaging lung cancer EXAM: CT CHEST WITH CONTRAST TECHNIQUE: Multidetector CT imaging of the chest was performed during intravenous contrast administration. CONTRAST:  53m ISOVUE-300 IOPAMIDOL (ISOVUE-300) INJECTION 61% COMPARISON:  04/03/2016 FINDINGS: Cardiovascular: The heart size appears normal. Aortic atherosclerosis noted. Calcifications involving  the LAD and RCA coronary artery identified. There is no pericardial effusion identified. Mediastinum/Nodes: The trachea appears patent and is midline. Normal appearance of the esophagus. Right hilar node measures 2.1 x 1.1 cm, image 67 of series 2. On the previous exam this measure 2.1 x 1.2 cm. The adjacent node measures 7 mm, image 62 of series 2. Previously 1.1 cm. No new or enlarging mediastinal or hilar lymph nodes identified. Lungs/Pleura: There is no pleural fluid. Moderate to advanced changes of centrilobular emphysema identified. Diffuse bronchial wall thickening noted. Progressive changes of external beam radiation are identified throughout the paramediastinal right lung. The index right middle lobe pulmonary nodule is stable measuring 5 mm, image 72 of series 5. Lateral right middle lobe lung nodule is stable measuring 7 mm, image number 86 of series 5. Upper Abdomen: 4 mm stone is identified within the upper pole the right kidney. No acute abnormality identified. The adrenal glands appear normal Musculoskeletal: There is no aggressive lytic or sclerotic bone lesions identified. IMPRESSION: 1. Progressive changes of external beam radiation are identified within the paramediastinal right lung. 2. Stable right hilar adenopathy/mass. 3. No new sites of disease identified 4. Similar pulmonary nodularity within the right lung. 5. Aortic atherosclerosis and coronary artery calcifications. 6. Right renal calculus. Electronically Signed   By: TKerby MoorsM.D.   On: 06/02/2016 14:43    Impression:  The patient is recovering from the effects of radiation.  No evidence of recurrence on clinical exam today or on recent CT imaging. Patient is on observation at this time.  Plan:  He is scheduled to follow up with Dr. MJulien Nordmannon 09/07/2016 at 10:30 am. He will follow up in [place] the same day.  -----------------------------------  JBlair Promise PhD, MD  This document serves as a record of services  personally performed by JGery Pray MD. It was created on his behalf by EArlyce Harman a trained medical scribe. The creation of this record is based on the scribe's personal observations and the provider's statements to them. This document has been checked and approved by the attending provider.

## 2016-06-29 ENCOUNTER — Other Ambulatory Visit: Payer: Self-pay | Admitting: Internal Medicine

## 2016-09-05 ENCOUNTER — Encounter (HOSPITAL_COMMUNITY): Payer: Self-pay

## 2016-09-05 ENCOUNTER — Other Ambulatory Visit: Payer: Medicare Other

## 2016-09-05 ENCOUNTER — Ambulatory Visit (HOSPITAL_COMMUNITY)
Admission: RE | Admit: 2016-09-05 | Discharge: 2016-09-05 | Disposition: A | Discharge status: Home / Self Care | Payer: Medicare Other | Source: Ambulatory Visit | Attending: Internal Medicine | Admitting: Internal Medicine

## 2016-09-05 DIAGNOSIS — J700 Acute pulmonary manifestations due to radiation: Secondary | ICD-10-CM | POA: Insufficient documentation

## 2016-09-05 DIAGNOSIS — C3491 Malignant neoplasm of unspecified part of right bronchus or lung: Secondary | ICD-10-CM | POA: Insufficient documentation

## 2016-09-05 DIAGNOSIS — Z5111 Encounter for antineoplastic chemotherapy: Secondary | ICD-10-CM

## 2016-09-05 DIAGNOSIS — C3401 Malignant neoplasm of right main bronchus: Secondary | ICD-10-CM

## 2016-09-05 DIAGNOSIS — R918 Other nonspecific abnormal finding of lung field: Secondary | ICD-10-CM | POA: Diagnosis not present

## 2016-09-05 DIAGNOSIS — N200 Calculus of kidney: Secondary | ICD-10-CM | POA: Diagnosis not present

## 2016-09-05 DIAGNOSIS — J439 Emphysema, unspecified: Secondary | ICD-10-CM | POA: Insufficient documentation

## 2016-09-05 DIAGNOSIS — I700 Atherosclerosis of aorta: Secondary | ICD-10-CM | POA: Insufficient documentation

## 2016-09-05 LAB — CBC WITH DIFFERENTIAL/PLATELET
BASO%: 1 % (ref 0.0–2.0)
Basophils Absolute: 0.1 10*3/uL (ref 0.0–0.1)
EOS%: 1.2 % (ref 0.0–7.0)
Eosinophils Absolute: 0.1 10*3/uL (ref 0.0–0.5)
HCT: 43.4 % (ref 38.4–49.9)
HGB: 14.7 g/dL (ref 13.0–17.1)
LYMPH#: 1.4 10*3/uL (ref 0.9–3.3)
LYMPH%: 13.8 % — AB (ref 14.0–49.0)
MCH: 31.1 pg (ref 27.2–33.4)
MCHC: 34 g/dL (ref 32.0–36.0)
MCV: 91.4 fL (ref 79.3–98.0)
MONO#: 0.9 10*3/uL (ref 0.1–0.9)
MONO%: 9.4 % (ref 0.0–14.0)
NEUT%: 74.6 % (ref 39.0–75.0)
NEUTROS ABS: 7.5 10*3/uL — AB (ref 1.5–6.5)
Platelets: 155 10*3/uL (ref 140–400)
RBC: 4.74 10*6/uL (ref 4.20–5.82)
RDW: 12.7 % (ref 11.0–14.6)
WBC: 10 10*3/uL (ref 4.0–10.3)

## 2016-09-05 LAB — COMPREHENSIVE METABOLIC PANEL
ALT: 18 U/L (ref 0–55)
ANION GAP: 10 meq/L (ref 3–11)
AST: 19 U/L (ref 5–34)
Albumin: 4.1 g/dL (ref 3.5–5.0)
Alkaline Phosphatase: 79 U/L (ref 40–150)
BILIRUBIN TOTAL: 0.35 mg/dL (ref 0.20–1.20)
BUN: 18.1 mg/dL (ref 7.0–26.0)
CO2: 26 meq/L (ref 22–29)
CREATININE: 1.3 mg/dL (ref 0.7–1.3)
Calcium: 9.9 mg/dL (ref 8.4–10.4)
Chloride: 103 mEq/L (ref 98–109)
EGFR: 55 mL/min/{1.73_m2} — ABNORMAL LOW (ref >90)
GLUCOSE: 135 mg/dL (ref 70–140)
Potassium: 4.1 mEq/L (ref 3.5–5.1)
Sodium: 139 mEq/L (ref 136–145)
TOTAL PROTEIN: 7.7 g/dL (ref 6.4–8.3)

## 2016-09-05 MED ORDER — IOPAMIDOL (ISOVUE-300) INJECTION 61%
INTRAVENOUS | Status: AC
  Filled 2016-09-05: qty 75

## 2016-09-05 MED ORDER — IOPAMIDOL (ISOVUE-300) INJECTION 61%
75.0000 mL | Freq: Once | INTRAVENOUS | Status: AC | PRN
  Administered 2016-09-05: 75 mL via INTRAVENOUS

## 2016-09-07 ENCOUNTER — Ambulatory Visit
Admission: RE | Admit: 2016-09-07 | Discharge: 2016-09-07 | Disposition: A | Discharge status: Home / Self Care | Payer: Medicare Other | Source: Ambulatory Visit | Attending: Radiation Oncology | Admitting: Radiation Oncology

## 2016-09-07 ENCOUNTER — Encounter: Payer: Self-pay | Admitting: Internal Medicine

## 2016-09-07 ENCOUNTER — Encounter: Payer: Self-pay | Admitting: Radiation Oncology

## 2016-09-07 ENCOUNTER — Ambulatory Visit (HOSPITAL_BASED_OUTPATIENT_CLINIC_OR_DEPARTMENT_OTHER): Payer: Medicare Other | Admitting: Internal Medicine

## 2016-09-07 ENCOUNTER — Telehealth: Payer: Self-pay | Admitting: Internal Medicine

## 2016-09-07 VITALS — BP 123/74 | HR 89 | Temp 98.2°F | Ht 72.0 in | Wt 179.0 lb

## 2016-09-07 VITALS — BP 123/74 | HR 89 | Temp 98.2°F | Resp 18 | Ht 72.0 in | Wt 179.8 lb

## 2016-09-07 DIAGNOSIS — C3491 Malignant neoplasm of unspecified part of right bronchus or lung: Secondary | ICD-10-CM | POA: Insufficient documentation

## 2016-09-07 DIAGNOSIS — M25511 Pain in right shoulder: Secondary | ICD-10-CM | POA: Insufficient documentation

## 2016-09-07 DIAGNOSIS — J329 Chronic sinusitis, unspecified: Secondary | ICD-10-CM | POA: Diagnosis not present

## 2016-09-07 DIAGNOSIS — J700 Acute pulmonary manifestations due to radiation: Secondary | ICD-10-CM

## 2016-09-07 DIAGNOSIS — Z79899 Other long term (current) drug therapy: Secondary | ICD-10-CM | POA: Insufficient documentation

## 2016-09-07 DIAGNOSIS — Y842 Radiological procedure and radiotherapy as the cause of abnormal reaction of the patient, or of later complication, without mention of misadventure at the time of the procedure: Secondary | ICD-10-CM | POA: Insufficient documentation

## 2016-09-07 DIAGNOSIS — C3401 Malignant neoplasm of right main bronchus: Secondary | ICD-10-CM

## 2016-09-07 DIAGNOSIS — Z7982 Long term (current) use of aspirin: Secondary | ICD-10-CM | POA: Insufficient documentation

## 2016-09-07 NOTE — Progress Notes (Signed)
Radiation Oncology         [phone] ________________________________  Name: Mark Howell MRN: [id number]  Date: 09/07/2016  DOB: [birth year]  ll  Follow-Up Visit Note  CC: Mark Housekeeper, MD  Mark Housekeeper, MD    ICD-9-CM ICD-10-CM   1. Adenocarcinoma of right lung, stage 2 (HCC) 162.9 C34.91     Diagnosis:   Adenocarcinoma of the right lung, stage 2  Interval Since Last Radiation:  4 months, 04/17/16-05/02/16 20 Gy in 10 fractions (cumulative dose 65 Gy) Right lung boost  Narrative:  The patient returns today for routine follow-up. His most recent chest CT on 09/05/16 showed radiation changes in the right lung with no other significant changes. He reports pain in his right shoulder. When he lays on his right side, he reports a tingling sensation in his hand. He notes this has been an ongoing problem. He also reports leg cramps that wake him up at night. He notes shortness of breath with activity. He reports he currently has a sinus infection that he is managing with Levaquin and a resolving cough. He notes fatigue. He denies difficulty/pain with swallowing.                            ALLERGIES:  is allergic to hydrocodone and oxycodone.  Meds: Current Outpatient Prescriptions  Medication Sig Dispense Refill  . acetaminophen (TYLENOL) 500 MG tablet Take 500-1,000 mg by mouth 2 (two) times daily as needed for mild pain.    Marland Kitchen aspirin EC 81 MG tablet Take 81 mg by mouth at bedtime.     . clotrimazole-betamethasone (LOTRISONE) cream Apply 1 application topically 2 (two) times daily.    . diphenhydramine-acetaminophen (TYLENOL PM) 25-500 MG TABS Take 1 tablet by mouth at bedtime. For sleep    . ENBREL SURECLICK 50 MG/ML injection Inject 50 mg into the skin once a week. Tuesday    . levofloxacin (LEVAQUIN) 500 MG tablet Take by mouth.    . Multiple Vitamin (MULITIVITAMIN WITH MINERALS) TABS Take 1 tablet by mouth daily.    . pantoprazole (PROTONIX) 40 MG tablet Take 40 mg by  mouth daily.     . pravastatin (PRAVACHOL) 40 MG tablet Take 40 mg by mouth at bedtime.     . predniSONE (DELTASONE) 10 MG tablet Take 5 mg by mouth daily.     . tamsulosin (FLOMAX) 0.4 MG CAPS capsule TAKE (1) CAPSULE DAILY, START 4 DAYS BEFORE PROCEDURE (Patient taking differently: take 1 capsule by mouth every morning) 30 capsule 2  . triamcinolone (NASACORT) 55 MCG/ACT AERO nasal inhaler Place into the nose.    Marland Kitchen aspirin-sod bicarb-citric acid (ALKA-SELTZER) 325 MG TBEF tablet Take 325 mg by mouth every 6 (six) hours as needed (indigestion).    . nystatin (MYCOSTATIN/NYSTOP) powder Apply to affected area 3 times daily    . polyethylene glycol (MIRALAX / GLYCOLAX) packet Take 17 g by mouth daily.    . prochlorperazine (COMPAZINE) 10 MG tablet Take 1 tablet (10 mg total) by mouth every 6 (six) hours as needed for nausea or vomiting. (Patient not taking: Reported on 06/07/2016) 30 tablet 0   No current facility-administered medications for this encounter.     Physical Findings: The patient is in no acute distress. Patient is alert and oriented.  height is 6' (1.829 m) and weight is 179 lb (81.2 kg). His oral temperature is 98.2 F (36.8 C). His blood pressure is 123/74 and his  pulse is 89. His oxygen saturation is 99%. .   Lungs are clear to auscultation bilaterally. Heart has regular rate and rhythm. No palpable cervical, supraclavicular, or axillary adenopathy.   Lab Findings: Lab Results  Component Value Date   WBC 10.0 09/05/2016   HGB 14.7 09/05/2016   HCT 43.4 09/05/2016   MCV 91.4 09/05/2016   PLT 155 09/05/2016    Radiographic Findings: Ct Chest W Contrast  Result Date: 09/05/2016 CLINICAL DATA:  Restaging right lung cancer diagnosed in 2017. Chemotherapy and radiation therapy completed. EXAM: CT CHEST WITH CONTRAST TECHNIQUE: Multidetector CT imaging of the chest was performed during intravenous contrast administration. CONTRAST:  69m ISOVUE-300 IOPAMIDOL (ISOVUE-300)  INJECTION 61% COMPARISON:  CT 06/2016 and 04/03/2016 FINDINGS: Cardiovascular: No acute vascular findings. There is atherosclerosis of the aorta, great vessels and coronary arteries. The heart size is normal. There is no pericardial effusion. Mediastinum/Nodes: Stable right hilar nodal tissue measuring approximately 2.2 x 1.2 cm on image 68. Evaluation of this area is limited by adjacent paramediastinal radiation changes. No enlarging mediastinal, hilar or axillary lymph nodes are seen. The thyroid gland, trachea and esophagus demonstrate no significant findings. Lungs/Pleura: There is no pleural effusion. Stable emphysema and biapical scarring. There are mildly progressive paramediastinal radiation changes in the right lung, partly obscuring the previously demonstrated central right middle lobe nodule. Peripheral subpleural nodule in the right middle lobe is unchanged, measuring 8 x 6 mm on image 85. There is stable linear scarring in the lingula and left lower lobe. No new or enlarging pulmonary nodules. Upper abdomen: No acute findings are seen within the visualized upper abdomen. There is cortical scarring in the upper poles of both kidneys and nonobstructing renal calculi on the right. Musculoskeletal/Chest wall: There is no chest wall mass or suspicious osseous finding. IMPRESSION: 1. Mild progression in paramediastinal radiation changes in the right lung. 2. No other significant changes. Small right middle lobe nodules and mildly prominent right hilar lymph nodes are unchanged. 3. No evidence of local recurrence or metastatic disease. Electronically Signed   By: WRichardean SaleM.D.   On: 09/05/2016 14:57    Impression:  The patient is recovering from the effects of radiation.  No evidence of recurrence on clinical exam today or on recent CT imaging.   Plan: Patient will follow up with Dr. MJulien Nordmanntoday at 10:30 am and make a determination if he will have close follow-up or proceed with immunotherapy. He  will follow up in Rad[facility]c on a prn basis.  -----------------------------------  JBlair Promise PhD, MD  This document serves as a record of services personally performed by JGery Pray MD. It was created on his behalf by LBethann Humble a trained medical scribe. The creation of this record is based on the scribe's personal observations and the provider's statements to them. This document has been checked and approved by the attending provider.

## 2016-09-07 NOTE — Telephone Encounter (Signed)
Scheduled appt per 5/10 los - Left message and sent appt letter in  Mail.

## 2016-09-07 NOTE — Addendum Note (Signed)
Encounter addended by: Jacqulyn Liner, RN on: 09/07/2016  1:25 PM<BR>    Actions taken: Charge Capture section accepted

## 2016-09-07 NOTE — Progress Notes (Signed)
[facility] [phone]   [phone]  OFFICE PROGRESS NOTE  [name], MD [place] [address]  DIAGNOSIS: Unresectable a stage IIA (T1a, N1, M0) non-small cell lung cancer, adenocarcinoma presented with a right hilar mass and right hilar adenopathy diagnosed in September 2017.  PRIOR THERAPY: Status post concurrent chemoradiation with weekly carboplatin for AUC of 2 and paclitaxel 45 MG/M2 for 7 weeks.  CURRENT THERAPY: Observation.  INTERVAL HISTORY: Mark Howell 76 y.o. male returns to the clinic today for follow-up visit. The patient is feeling fine today with no specific complaints. He denied having any chest pain, shortness of breath, cough or hemoptysis. He denied having any recent weight loss or night sweats. He has no nausea, vomiting, diarrhea or constipation. The patient has no fever or chills. He was recently for acute bronchitis with Levaquin and feeling much better. He had repeat CT scan of the chest performed recently and he is here for evaluation and discussion of his scan results.  MEDICAL HISTORY: Past Medical History:  Diagnosis Date  . Arthritis   . Complication of anesthesia    problems voiding post op  . Full dentures   . GERD (gastroesophageal reflux disease)   . History of radiation therapy 01/25/16-02/28/16   right lung 45 Gy  . History of radiation therapy 04/17/16-05/02/16   right lung boost 20 Gy in 10 fractions cumulative dose 65 gray  . HOH (hard of hearing)   . HTN (hypertension)   . Hyperlipidemia   . Lung mass 12/30/2015  . Odynophagia 04/06/2016  . Psoriatic arthritis ([place])   . Snores   . Wears glasses     ALLERGIES:  is allergic to hydrocodone and oxycodone.  MEDICATIONS:  Current Outpatient Prescriptions  Medication Sig Dispense Refill  . acetaminophen (TYLENOL) 500 MG tablet Take 500-1,000 mg by mouth 2 (two) times daily as needed for mild pain.    Marland Kitchen aspirin EC 81 MG  tablet Take 81 mg by mouth at bedtime.     Marland Kitchen aspirin-sod bicarb-citric acid (ALKA-SELTZER) 325 MG TBEF tablet Take 325 mg by mouth every 6 (six) hours as needed (indigestion).    . clotrimazole-betamethasone (LOTRISONE) cream Apply 1 application topically 2 (two) times daily.    . diphenhydramine-acetaminophen (TYLENOL PM) 25-500 MG TABS Take 1 tablet by mouth at bedtime. For sleep    . ENBREL SURECLICK 50 MG/ML injection Inject 50 mg into the skin once a week. Tuesday    . levofloxacin (LEVAQUIN) 500 MG tablet Take by mouth.    . Multiple Vitamin (MULITIVITAMIN WITH MINERALS) TABS Take 1 tablet by mouth daily.    Marland Kitchen nystatin (MYCOSTATIN/NYSTOP) powder Apply to affected area 3 times daily    . pantoprazole (PROTONIX) 40 MG tablet Take 40 mg by mouth daily.     . polyethylene glycol (MIRALAX / GLYCOLAX) packet Take 17 g by mouth daily.    . pravastatin (PRAVACHOL) 40 MG tablet Take 40 mg by mouth at bedtime.     . predniSONE (DELTASONE) 10 MG tablet Take 5 mg by mouth daily.     . prochlorperazine (COMPAZINE) 10 MG tablet Take 1 tablet (10 mg total) by mouth every 6 (six) hours as needed for nausea or vomiting. (Patient not taking: Reported on 06/07/2016) 30 tablet 0  . sucralfate (CARAFATE) 1 g tablet TAKE 1 TABLET FOUR TIMES DAILY BEFORE MEALS & AT BEDTIME (Patient not taking: Reported on 09/07/2016) 120 tablet 0  .  tamsulosin (FLOMAX) 0.4 MG CAPS capsule TAKE (1) CAPSULE DAILY, START 4 DAYS BEFORE PROCEDURE (Patient taking differently: take 1 capsule by mouth every morning) 30 capsule 2  . triamcinolone (NASACORT) 55 MCG/ACT AERO nasal inhaler Place into the nose.     No current facility-administered medications for this visit.     SURGICAL HISTORY:  Past Surgical History:  Procedure Laterality Date  . COLONOSCOPY    . HEMORRHOID SURGERY N/A 10/01/2013   Procedure: EXAM UNDER ANESTHESIA  AND EXCISIONAL HEMORRHOIDECTOMY WITH HEMORRHOID BANDING X 2;  Surgeon: Gayland Curry, MD;  Location: [facility];  Service: General;  Laterality: N/A;  . INGUINAL HERNIA REPAIR  01/04/2012   Procedure: LAPAROSCOPIC INGUINAL HERNIA;  Surgeon: Gayland Curry, MD,FACS;  Location: [facility];  Service: General;  Laterality: Right;  Marland Kitchen VIDEO BRONCHOSCOPY WITH ENDOBRONCHIAL ULTRASOUND N/A 12/31/2015   Procedure: VIDEO BRONCHOSCOPY WITH ENDOBRONCHIAL ULTRASOUND transbronchial biopsy of node 10 R lymph node;  Surgeon: Grace Isaac, MD;  Location: [facility] OR;  Service: Thoracic;  Laterality: N/A;    REVIEW OF SYSTEMS:  A comprehensive review of systems was negative.   PHYSICAL EXAMINATION: General appearance: alert, cooperative and no distress Head: Normocephalic, without obvious abnormality, atraumatic Neck: no adenopathy, no JVD, supple, symmetrical, trachea midline and thyroid not enlarged, symmetric, no tenderness/mass/nodules Lymph nodes: Cervical, supraclavicular, and axillary nodes normal. Resp: clear to auscultation bilaterally Back: symmetric, no curvature. ROM normal. No CVA tenderness. Cardio: regular rate and rhythm, S1, S2 normal, no murmur, click, rub or gallop GI: soft, non-tender; bowel sounds normal; no masses,  no organomegaly Extremities: extremities normal, atraumatic, no cyanosis or edema  ECOG PERFORMANCE STATUS: 0 - Asymptomatic  Blood pressure 123/74, pulse 89, temperature 98.2 F (36.8 C), temperature source Oral, resp. rate 18, height 6' (1.829 m), weight 179 lb 12.8 oz (81.6 kg), SpO2 99 %.  LABORATORY DATA: Lab Results  Component Value Date   WBC 10.0 09/05/2016   HGB 14.7 09/05/2016   HCT 43.4 09/05/2016   MCV 91.4 09/05/2016   PLT 155 09/05/2016      Chemistry      Component Value Date/Time   NA 139 09/05/2016 1014   K 4.1 09/05/2016 1014   CL 99 (L) 03/05/2016 1344   CO2 26 09/05/2016 1014   BUN 18.1 09/05/2016 1014   CREATININE 1.3 09/05/2016 1014      Component Value Date/Time   CALCIUM 9.9 09/05/2016 1014   ALKPHOS 79 09/05/2016 1014   AST 19  09/05/2016 1014   ALT 18 09/05/2016 1014   BILITOT 0.35 09/05/2016 1014       RADIOGRAPHIC STUDIES: Ct Chest W Contrast  Result Date: 09/05/2016 CLINICAL DATA:  Restaging right lung cancer diagnosed in 2017. Chemotherapy and radiation therapy completed. EXAM: CT CHEST WITH CONTRAST TECHNIQUE: Multidetector CT imaging of the chest was performed during intravenous contrast administration. CONTRAST:  18m ISOVUE-300 IOPAMIDOL (ISOVUE-300) INJECTION 61% COMPARISON:  CT 06/2016 and 04/03/2016 FINDINGS: Cardiovascular: No acute vascular findings. There is atherosclerosis of the aorta, great vessels and coronary arteries. The heart size is normal. There is no pericardial effusion. Mediastinum/Nodes: Stable right hilar nodal tissue measuring approximately 2.2 x 1.2 cm on image 68. Evaluation of this area is limited by adjacent paramediastinal radiation changes. No enlarging mediastinal, hilar or axillary lymph nodes are seen. The thyroid gland, trachea and esophagus demonstrate no significant findings. Lungs/Pleura: There is no pleural effusion. Stable emphysema and biapical scarring. There are mildly progressive paramediastinal radiation changes in  the right lung, partly obscuring the previously demonstrated central right middle lobe nodule. Peripheral subpleural nodule in the right middle lobe is unchanged, measuring 8 x 6 mm on image 85. There is stable linear scarring in the lingula and left lower lobe. No new or enlarging pulmonary nodules. Upper abdomen: No acute findings are seen within the visualized upper abdomen. There is cortical scarring in the upper poles of both kidneys and nonobstructing renal calculi on the right. Musculoskeletal/Chest wall: There is no chest wall mass or suspicious osseous finding. IMPRESSION: 1. Mild progression in paramediastinal radiation changes in the right lung. 2. No other significant changes. Small right middle lobe nodules and mildly prominent right hilar lymph nodes are  unchanged. 3. No evidence of local recurrence or metastatic disease. Electronically Signed   By: Richardean Sale M.D.   On: 09/05/2016 14:57    ASSESSMENT AND PLAN:  This is a very pleasant 76 years old white male with unresectable a stage II a non-small cell lung cancer, adenocarcinoma presented with right hilar mass status post a course of concurrent chemoradiation with weekly carboplatin and paclitaxel. He has partial response to this treatment. The patient is currently on observation and repeat CT scan of the chest performed recently showed no evidence for disease progression except for the progression of the postradiation changes in the right lung. I discussed the scan results with the patient and his wife. I recommended for him to continue on observation with repeat CT scan of the chest in 3 months. He was advised to call immediately if he has any concerning symptoms in the interval. The patient voices understanding of current disease status and treatment options and is in agreement with the current care plan. All questions were answered. The patient knows to call the clinic with any problems, questions or concerns. We can certainly see the patient much sooner if necessary. I spent 10 minutes counseling the patient face to face. The total time spent in the appointment was 15 minutes.  Disclaimer: This note was dictated with voice recognition software. Similar sounding words can inadvertently be transcribed and may not be corrected upon review.

## 2016-09-07 NOTE — Progress Notes (Signed)
Mark Howell is here for follow up after treatment to his right lung.  He reports having pain in his right shoulder that makes his hand tingle when he lays on his right side.  He said he has had this for a long time.  He also reports having cramps in his legs at night that wake him up.  He reports having shortness of breath with activity.  He reports having a sinus infection and has been taking levaquin.  He reports he had a bad cough which is better now.  He reports having fatigue.  He denies having any trouble swallowing or a sore throat.   BP 123/74 (BP Location: Left Arm, Patient Position: Sitting)   Pulse 89   Temp 98.2 F (36.8 C) (Oral)   Ht 6' (1.829 m)   Wt 179 lb (81.2 kg)   SpO2 99%   BMI 24.28 kg/m    Wt Readings from Last 3 Encounters:  09/07/16 179 lb (81.2 kg)  06/15/16 188 lb (85.3 kg)  06/07/16 184 lb 3.2 oz (83.6 kg)

## 2016-12-06 ENCOUNTER — Other Ambulatory Visit: Payer: Medicare Other

## 2016-12-08 ENCOUNTER — Encounter (HOSPITAL_COMMUNITY): Payer: Self-pay

## 2016-12-08 ENCOUNTER — Ambulatory Visit (HOSPITAL_COMMUNITY)
Admission: RE | Admit: 2016-12-08 | Discharge: 2016-12-08 | Disposition: A | Discharge status: Home / Self Care | Payer: Medicare Other | Source: Ambulatory Visit | Attending: Internal Medicine | Admitting: Internal Medicine

## 2016-12-08 ENCOUNTER — Other Ambulatory Visit (HOSPITAL_BASED_OUTPATIENT_CLINIC_OR_DEPARTMENT_OTHER): Payer: Medicare Other

## 2016-12-08 ENCOUNTER — Ambulatory Visit (HOSPITAL_COMMUNITY): Payer: Medicare Other

## 2016-12-08 DIAGNOSIS — C3401 Malignant neoplasm of right main bronchus: Secondary | ICD-10-CM

## 2016-12-08 DIAGNOSIS — N200 Calculus of kidney: Secondary | ICD-10-CM | POA: Insufficient documentation

## 2016-12-08 DIAGNOSIS — C3491 Malignant neoplasm of unspecified part of right bronchus or lung: Secondary | ICD-10-CM | POA: Insufficient documentation

## 2016-12-08 DIAGNOSIS — J439 Emphysema, unspecified: Secondary | ICD-10-CM | POA: Diagnosis not present

## 2016-12-08 DIAGNOSIS — I2510 Atherosclerotic heart disease of native coronary artery without angina pectoris: Secondary | ICD-10-CM | POA: Diagnosis not present

## 2016-12-08 DIAGNOSIS — Z9889 Other specified postprocedural states: Secondary | ICD-10-CM | POA: Insufficient documentation

## 2016-12-08 DIAGNOSIS — J700 Acute pulmonary manifestations due to radiation: Secondary | ICD-10-CM

## 2016-12-08 DIAGNOSIS — I700 Atherosclerosis of aorta: Secondary | ICD-10-CM | POA: Diagnosis not present

## 2016-12-08 DIAGNOSIS — E279 Disorder of adrenal gland, unspecified: Secondary | ICD-10-CM | POA: Insufficient documentation

## 2016-12-08 LAB — CBC WITH DIFFERENTIAL/PLATELET
BASO%: 1 % (ref 0.0–2.0)
Basophils Absolute: 0.1 10*3/uL (ref 0.0–0.1)
EOS ABS: 0.3 10*3/uL (ref 0.0–0.5)
EOS%: 3.8 % (ref 0.0–7.0)
HCT: 43.2 % (ref 38.4–49.9)
HGB: 14.9 g/dL (ref 13.0–17.1)
LYMPH%: 19.2 % (ref 14.0–49.0)
MCH: 31.6 pg (ref 27.2–33.4)
MCHC: 34.4 g/dL (ref 32.0–36.0)
MCV: 91.8 fL (ref 79.3–98.0)
MONO#: 1 10*3/uL — AB (ref 0.1–0.9)
MONO%: 13.6 % (ref 0.0–14.0)
NEUT#: 4.8 10*3/uL (ref 1.5–6.5)
NEUT%: 62.4 % (ref 39.0–75.0)
PLATELETS: 136 10*3/uL — AB (ref 140–400)
RBC: 4.71 10*6/uL (ref 4.20–5.82)
RDW: 13.9 % (ref 11.0–14.6)
WBC: 7.7 10*3/uL (ref 4.0–10.3)
lymph#: 1.5 10*3/uL (ref 0.9–3.3)

## 2016-12-08 LAB — COMPREHENSIVE METABOLIC PANEL
ALT: 15 U/L (ref 0–55)
ANION GAP: 10 meq/L (ref 3–11)
AST: 21 U/L (ref 5–34)
Albumin: 4 g/dL (ref 3.5–5.0)
Alkaline Phosphatase: 78 U/L (ref 40–150)
BUN: 19.2 mg/dL (ref 7.0–26.0)
CHLORIDE: 102 meq/L (ref 98–109)
CO2: 25 meq/L (ref 22–29)
Calcium: 9.5 mg/dL (ref 8.4–10.4)
Creatinine: 1.3 mg/dL (ref 0.7–1.3)
EGFR: 52 mL/min/{1.73_m2} — AB (ref >90)
Glucose: 142 mg/dl — ABNORMAL HIGH (ref 70–140)
Potassium: 4 mEq/L (ref 3.5–5.1)
Sodium: 137 mEq/L (ref 136–145)
Total Bilirubin: 0.64 mg/dL (ref 0.20–1.20)
Total Protein: 7.8 g/dL (ref 6.4–8.3)

## 2016-12-08 MED ORDER — IOPAMIDOL (ISOVUE-300) INJECTION 61%
75.0000 mL | Freq: Once | INTRAVENOUS | Status: AC | PRN
  Administered 2016-12-08: 75 mL via INTRAVENOUS

## 2016-12-08 MED ORDER — IOPAMIDOL (ISOVUE-300) INJECTION 61%
INTRAVENOUS | Status: AC
  Administered 2016-12-08: 75 mL via INTRAVENOUS
  Filled 2016-12-08: qty 75

## 2016-12-13 ENCOUNTER — Encounter: Payer: Self-pay | Admitting: *Deleted

## 2016-12-13 ENCOUNTER — Encounter: Payer: Self-pay | Admitting: Internal Medicine

## 2016-12-13 ENCOUNTER — Telehealth: Payer: Self-pay

## 2016-12-13 ENCOUNTER — Ambulatory Visit: Payer: Medicare Other

## 2016-12-13 ENCOUNTER — Ambulatory Visit (HOSPITAL_BASED_OUTPATIENT_CLINIC_OR_DEPARTMENT_OTHER): Payer: Medicare Other | Admitting: Internal Medicine

## 2016-12-13 VITALS — BP 119/60 | HR 94 | Temp 98.0°F | Resp 18 | Ht 72.0 in | Wt 181.1 lb

## 2016-12-13 DIAGNOSIS — M069 Rheumatoid arthritis, unspecified: Secondary | ICD-10-CM

## 2016-12-13 DIAGNOSIS — C3401 Malignant neoplasm of right main bronchus: Secondary | ICD-10-CM | POA: Diagnosis not present

## 2016-12-13 DIAGNOSIS — Z5111 Encounter for antineoplastic chemotherapy: Secondary | ICD-10-CM

## 2016-12-13 DIAGNOSIS — C3491 Malignant neoplasm of unspecified part of right bronchus or lung: Secondary | ICD-10-CM

## 2016-12-13 DIAGNOSIS — E279 Disorder of adrenal gland, unspecified: Secondary | ICD-10-CM | POA: Diagnosis not present

## 2016-12-13 NOTE — Telephone Encounter (Signed)
appts made and avs printed for patient 

## 2016-12-13 NOTE — Progress Notes (Signed)
[place] [phone]   [phone]  OFFICE PROGRESS NOTE  [name], MD [place] [address]  DIAGNOSIS: Unresectable a stage IIA (T1a, N1, M0) non-small cell lung cancer, adenocarcinoma presented with a right hilar mass and right hilar adenopathy diagnosed in September 2017.  PRIOR THERAPY: Status post concurrent chemoradiation with weekly carboplatin for AUC of 2 and paclitaxel 45 MG/M2 for 7 weeks.  CURRENT THERAPY: Observation.  INTERVAL HISTORY: Mark Howell 76 y.o. male returns to the clinic today for follow-up visit accompanied by his wife and daughter. The patient is feeling fine today with no specific complaints except for mild fatigue and shortness of breath with exertion. He has been observation for the last few months. He denied having any chest pain, cough or hemoptysis. He has no weight loss or night sweats. He has no nausea, vomiting, diarrhea or constipation. The patient denied having any headache or visual changes. He had repeat CT scan of the chest performed recently and he is here for evaluation and discussion of his scan results.  MEDICAL HISTORY: Past Medical History:  Diagnosis Date  . Arthritis   . Complication of anesthesia    problems voiding post op  . Full dentures   . GERD (gastroesophageal reflux disease)   . History of radiation therapy 01/25/16-02/28/16   right lung 45 Gy  . History of radiation therapy 04/17/16-05/02/16   right lung boost 20 Gy in 10 fractions cumulative dose 65 gray  . HOH (hard of hearing)   . HTN (hypertension)   . Hyperlipidemia   . Lung mass 12/30/2015  . Odynophagia 04/06/2016  . Psoriatic arthritis ([place])   . Snores   . Wears glasses     ALLERGIES:  is allergic to hydrocodone and oxycodone.  MEDICATIONS:  Current Outpatient Prescriptions  Medication Sig Dispense Refill  . acetaminophen (TYLENOL) 500 MG tablet Take 500-1,000 mg by mouth 2 (two) times  daily as needed for mild pain.    Marland Kitchen aspirin EC 81 MG tablet Take 81 mg by mouth at bedtime.     Marland Kitchen aspirin-sod bicarb-citric acid (ALKA-SELTZER) 325 MG TBEF tablet Take 325 mg by mouth every 6 (six) hours as needed (indigestion).    . clotrimazole-betamethasone (LOTRISONE) cream Apply 1 application topically 2 (two) times daily.    . diphenhydramine-acetaminophen (TYLENOL PM) 25-500 MG TABS Take 1 tablet by mouth at bedtime. For sleep    . ENBREL SURECLICK 50 MG/ML injection Inject 50 mg into the skin once a week. Tuesday    . Multiple Vitamin (MULITIVITAMIN WITH MINERALS) TABS Take 1 tablet by mouth daily.    Marland Kitchen nystatin (MYCOSTATIN/NYSTOP) powder Apply to affected area 3 times daily    . pantoprazole (PROTONIX) 40 MG tablet Take 40 mg by mouth daily.     . polyethylene glycol (MIRALAX / GLYCOLAX) packet Take 17 g by mouth daily.    . pravastatin (PRAVACHOL) 40 MG tablet Take 40 mg by mouth at bedtime.     . predniSONE (DELTASONE) 10 MG tablet Take 5 mg by mouth daily.     . prochlorperazine (COMPAZINE) 10 MG tablet Take 1 tablet (10 mg total) by mouth every 6 (six) hours as needed for nausea or vomiting. (Patient not taking: Reported on 06/07/2016) 30 tablet 0  . tamsulosin (FLOMAX) 0.4 MG CAPS capsule TAKE (1) CAPSULE DAILY, START 4 DAYS BEFORE PROCEDURE (Patient taking differently: take 1 capsule by mouth every morning) 30 capsule 2  .  triamcinolone (NASACORT) 55 MCG/ACT AERO nasal inhaler Place into the nose.     No current facility-administered medications for this visit.     SURGICAL HISTORY:  Past Surgical History:  Procedure Laterality Date  . COLONOSCOPY    . HEMORRHOID SURGERY N/A 10/01/2013   Procedure: EXAM UNDER ANESTHESIA  AND EXCISIONAL HEMORRHOIDECTOMY WITH HEMORRHOID BANDING X 2;  Surgeon: Gayland Curry, MD;  Location: [facility];  Service: General;  Laterality: N/A;  . INGUINAL HERNIA REPAIR  01/04/2012   Procedure: LAPAROSCOPIC INGUINAL HERNIA;  Surgeon: Gayland Curry, MD,FACS;  Location: [facility];  Service: General;  Laterality: Right;  Marland Kitchen VIDEO BRONCHOSCOPY WITH ENDOBRONCHIAL ULTRASOUND N/A 12/31/2015   Procedure: VIDEO BRONCHOSCOPY WITH ENDOBRONCHIAL ULTRASOUND transbronchial biopsy of node 10 R lymph node;  Surgeon: Grace Isaac, MD;  Location: [facility];  Service: Thoracic;  Laterality: N/A;    REVIEW OF SYSTEMS:  Constitutional: positive for fatigue Eyes: negative Ears, nose, mouth, throat, and face: negative Respiratory: positive for dyspnea on exertion Cardiovascular: negative Gastrointestinal: negative Genitourinary:negative Integument/breast: negative Hematologic/lymphatic: negative Musculoskeletal:negative Neurological: negative Behavioral/Psych: negative Endocrine: negative Allergic/Immunologic: negative   PHYSICAL EXAMINATION: General appearance: alert, cooperative, fatigued and no distress Head: Normocephalic, without obvious abnormality, atraumatic Neck: no adenopathy, no JVD, supple, symmetrical, trachea midline and thyroid not enlarged, symmetric, no tenderness/mass/nodules Lymph nodes: Cervical, supraclavicular, and axillary nodes normal. Resp: clear to auscultation bilaterally Back: symmetric, no curvature. ROM normal. No CVA tenderness. Cardio: regular rate and rhythm, S1, S2 normal, no murmur, click, rub or gallop GI: soft, non-tender; bowel sounds normal; no masses,  no organomegaly Extremities: extremities normal, atraumatic, no cyanosis or edema Neurologic: Alert and oriented X 3, normal strength and tone. Normal symmetric reflexes. Normal coordination and gait  ECOG PERFORMANCE STATUS: 0 - Asymptomatic  Blood pressure 119/60, pulse 94, temperature 98 F (36.7 C), temperature source Oral, resp. rate 18, height 6' (1.829 m), weight 181 lb 1.6 oz (82.1 kg), SpO2 99 %.  LABORATORY DATA: Lab Results  Component Value Date   WBC 7.7 12/08/2016   HGB 14.9 12/08/2016   HCT 43.2 12/08/2016   MCV 91.8 12/08/2016   PLT  136 (L) 12/08/2016      Chemistry      Component Value Date/Time   NA 137 12/08/2016 1355   K 4.0 12/08/2016 1355   CL 99 (L) 03/05/2016 1344   CO2 25 12/08/2016 1355   BUN 19.2 12/08/2016 1355   CREATININE 1.3 12/08/2016 1355      Component Value Date/Time   CALCIUM 9.5 12/08/2016 1355   ALKPHOS 78 12/08/2016 1355   AST 21 12/08/2016 1355   ALT 15 12/08/2016 1355   BILITOT 0.64 12/08/2016 1355       RADIOGRAPHIC STUDIES: Ct Chest W Contrast  Result Date: 12/09/2016 CLINICAL DATA:  Unresectable stage IIA right lung adenocarcinoma diagnosed September 2017 status post concurrent chemoradiation therapy. Radiation therapy completed 05/02/2016. Patient presents for restaging on interval observation. EXAM: CT CHEST WITH CONTRAST TECHNIQUE: Multidetector CT imaging of the chest was performed during intravenous contrast administration. CONTRAST:  75 cc Isovue-300 IV. COMPARISON:  09/05/2016 chest CT. FINDINGS: Cardiovascular: Normal heart size. No significant pericardial fluid/thickening. Left anterior descending, left circumflex and right coronary atherosclerosis. Atherosclerotic nonaneurysmal thoracic aorta. Normal caliber pulmonary arteries. No central pulmonary emboli. Mediastinum/Nodes: No discrete thyroid nodules. Unremarkable esophagus. No axillary adenopathy. Right hilar 2.8 x 1.8 cm mass (series 2/image 80), previously 2.2 x 1.2 cm, increased. Lungs/Pleura: No pneumothorax. No pleural effusion. Mild  to moderate centrilobular emphysema. Solid 8 x 5 mm peripheral right middle lobe (series 5/image 90) and 3 mm peripheral right lower lobe (series 5/ image 101) pulmonary nodules are stable. There is continued evolution of sharply marginated postradiation change in the parahilar right lung with increasing consolidation, volume loss and mild bronchiectasis. Mildly irregular parenchymal bands in the lingula and left lower lobe are stable and compatible with postinfectious/ postinflammatory  scarring. No acute consolidative airspace disease or new significant pulmonary nodules. Upper abdomen: A few scattered nonobstructing 2 mm upper right renal stones. Stable mild scarring in the lateral right kidney. New 1.9 cm and 1.8 cm right adrenal nodules and new 3.2 cm left adrenal nodule. Musculoskeletal: No aggressive appearing focal osseous lesions. Mild thoracic spondylosis. IMPRESSION: 1. Interval growth of right hilar mass, 2.8 x 1.8 cm, suspicious for local tumor recurrence. 2. 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 3. Continued evolution of postradiation change in the parahilar right lung. 4. Subcentimeter pulmonary nodules in the right middle and right lower lobes are stable. 5. Three-vessel coronary atherosclerosis. 6. Nonobstructing right nephrolithiasis. Aortic Atherosclerosis (ICD10-I70.0) and Emphysema (ICD10-J43.9). Electronically Signed   By: Ilona Sorrel M.D.   On: 12/09/2016 18:45    ASSESSMENT AND PLAN:  This is a very pleasant 76 years old white male with unresectable a stage II a non-small cell lung cancer, adenocarcinoma presented with right hilar mass status post a course of concurrent chemoradiation with weekly carboplatin and paclitaxel. He has partial response to this treatment. The patient has been observation for the last few months. 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 I personally and independently reviewed the scan images and discuss the results and showed the images to the patient and his family. I will order a PET scan for further evaluation of his disease and to rule out any other metastatic disease. I discussed with the patient several options for management of his condition including consideration of treatment with systemic chemotherapy with carboplatin, Alimta and Avastin versus treatment with immunotherapy but the  patient is currently on Enbrel for rheumatoid arthritis which may interfere with the treatment with immunotherapy. I also discussed with the patient sending blood sample to [facility] for molecular studies. I will see him back for follow-up visit in 2 weeks for reevaluation and more detailed discussion of his treatment options based on the molecular study. He was advised to call immediately if he has any concerning symptoms in the interval. The patient voices understanding of current disease status and treatment options and is in agreement with the current care plan. All questions were answered. The patient knows to call the clinic with any problems, questions or concerns. We can certainly see the patient much sooner if necessary.  Disclaimer: This note was dictated with voice recognition software. Similar sounding words can inadvertently be transcribed and may not be corrected upon review.

## 2016-12-13 NOTE — Progress Notes (Signed)
Oncology Nurse Navigator Documentation  Oncology Nurse Navigator Flowsheets 12/13/2016  Navigator Location [facility]  Navigator Encounter Type Other/per Dr. Julien Nordmann I completed [facility] forms and took to lab.  I updated patient.   Treatment Phase Follow-up  Barriers/Navigation Needs Coordination of Care  Interventions Coordination of Care  Coordination of Care Other  Acuity Level 2  Time Spent with Patient 30

## 2016-12-23 ENCOUNTER — Encounter (HOSPITAL_COMMUNITY)
Admission: RE | Admit: 2016-12-23 | Discharge: 2016-12-23 | Disposition: A | Discharge status: Home / Self Care | Payer: Medicare Other | Source: Ambulatory Visit | Attending: Internal Medicine | Admitting: Internal Medicine

## 2016-12-23 DIAGNOSIS — Z5111 Encounter for antineoplastic chemotherapy: Secondary | ICD-10-CM | POA: Insufficient documentation

## 2016-12-23 DIAGNOSIS — C3491 Malignant neoplasm of unspecified part of right bronchus or lung: Secondary | ICD-10-CM | POA: Diagnosis present

## 2016-12-23 LAB — GLUCOSE, CAPILLARY: Glucose-Capillary: 123 mg/dL — ABNORMAL HIGH (ref 65–99)

## 2016-12-23 MED ORDER — FLUDEOXYGLUCOSE F - 18 (FDG) INJECTION
9.4600 | Freq: Once | INTRAVENOUS | Status: AC | PRN
  Administered 2016-12-23: 9.46 via INTRAVENOUS

## 2016-12-26 ENCOUNTER — Other Ambulatory Visit: Payer: Self-pay | Admitting: Medical Oncology

## 2016-12-26 DIAGNOSIS — C3491 Malignant neoplasm of unspecified part of right bronchus or lung: Secondary | ICD-10-CM

## 2016-12-27 ENCOUNTER — Telehealth: Payer: Self-pay | Admitting: Internal Medicine

## 2016-12-27 ENCOUNTER — Other Ambulatory Visit (HOSPITAL_BASED_OUTPATIENT_CLINIC_OR_DEPARTMENT_OTHER): Payer: Medicare Other

## 2016-12-27 ENCOUNTER — Encounter: Payer: Self-pay | Admitting: Internal Medicine

## 2016-12-27 ENCOUNTER — Ambulatory Visit (HOSPITAL_BASED_OUTPATIENT_CLINIC_OR_DEPARTMENT_OTHER): Payer: Medicare Other | Admitting: Internal Medicine

## 2016-12-27 VITALS — BP 136/81 | HR 99 | Temp 98.1°F | Resp 18 | Wt 178.5 lb

## 2016-12-27 DIAGNOSIS — C3401 Malignant neoplasm of right main bronchus: Secondary | ICD-10-CM

## 2016-12-27 DIAGNOSIS — Z7189 Other specified counseling: Secondary | ICD-10-CM | POA: Insufficient documentation

## 2016-12-27 DIAGNOSIS — C7971 Secondary malignant neoplasm of right adrenal gland: Secondary | ICD-10-CM | POA: Diagnosis not present

## 2016-12-27 DIAGNOSIS — C3491 Malignant neoplasm of unspecified part of right bronchus or lung: Secondary | ICD-10-CM

## 2016-12-27 DIAGNOSIS — Z5111 Encounter for antineoplastic chemotherapy: Secondary | ICD-10-CM

## 2016-12-27 DIAGNOSIS — C7972 Secondary malignant neoplasm of left adrenal gland: Secondary | ICD-10-CM

## 2016-12-27 LAB — COMPREHENSIVE METABOLIC PANEL
ALT: 15 U/L (ref 0–55)
ANION GAP: 9 meq/L (ref 3–11)
AST: 20 U/L (ref 5–34)
Albumin: 4 g/dL (ref 3.5–5.0)
Alkaline Phosphatase: 78 U/L (ref 40–150)
BILIRUBIN TOTAL: 0.61 mg/dL (ref 0.20–1.20)
BUN: 28.5 mg/dL — ABNORMAL HIGH (ref 7.0–26.0)
CALCIUM: 9.8 mg/dL (ref 8.4–10.4)
CHLORIDE: 103 meq/L (ref 98–109)
CO2: 26 meq/L (ref 22–29)
Creatinine: 1.6 mg/dL — ABNORMAL HIGH (ref 0.7–1.3)
EGFR: 41 mL/min/{1.73_m2} — AB (ref >90)
Glucose: 149 mg/dl — ABNORMAL HIGH (ref 70–140)
Potassium: 4.4 mEq/L (ref 3.5–5.1)
Sodium: 137 mEq/L (ref 136–145)
Total Protein: 8 g/dL (ref 6.4–8.3)

## 2016-12-27 LAB — CBC WITH DIFFERENTIAL/PLATELET
BASO%: 0.8 % (ref 0.0–2.0)
BASOS ABS: 0.1 10*3/uL (ref 0.0–0.1)
EOS ABS: 0.2 10*3/uL (ref 0.0–0.5)
EOS%: 1.8 % (ref 0.0–7.0)
HCT: 43.6 % (ref 38.4–49.9)
HGB: 15 g/dL (ref 13.0–17.1)
LYMPH%: 11.3 % — AB (ref 14.0–49.0)
MCH: 31.4 pg (ref 27.2–33.4)
MCHC: 34.3 g/dL (ref 32.0–36.0)
MCV: 91.6 fL (ref 79.3–98.0)
MONO#: 0.8 10*3/uL (ref 0.1–0.9)
MONO%: 9 % (ref 0.0–14.0)
NEUT#: 6.9 10*3/uL — ABNORMAL HIGH (ref 1.5–6.5)
NEUT%: 77.1 % — AB (ref 39.0–75.0)
PLATELETS: 140 10*3/uL (ref 140–400)
RBC: 4.77 10*6/uL (ref 4.20–5.82)
RDW: 14 % (ref 11.0–14.6)
WBC: 8.9 10*3/uL (ref 4.0–10.3)
lymph#: 1 10*3/uL (ref 0.9–3.3)

## 2016-12-27 MED ORDER — PROCHLORPERAZINE MALEATE 10 MG PO TABS: 10.0000 mg | ORAL_TABLET | Freq: Four times a day (QID) | ORAL | 0 refills | Status: AC | PRN

## 2016-12-27 MED ORDER — FOLIC ACID 1 MG PO TABS: 1.0000 mg | ORAL_TABLET | Freq: Every day | ORAL | 4 refills | Status: DC

## 2016-12-27 MED ORDER — DEXAMETHASONE 4 MG PO TABS: ORAL_TABLET | ORAL | 1 refills | Status: DC

## 2016-12-27 MED ORDER — CYANOCOBALAMIN 1000 MCG/ML IJ SOLN
1000.0000 ug | Freq: Once | INTRAMUSCULAR | Status: AC
  Administered 2016-12-27: 1000 ug via INTRAMUSCULAR

## 2016-12-27 MED ORDER — LIDOCAINE-PRILOCAINE 2.5-2.5 % EX CREA: 1.0000 "application " | TOPICAL_CREAM | CUTANEOUS | 0 refills | Status: AC | PRN

## 2016-12-27 NOTE — Telephone Encounter (Signed)
Scheduled appt per 8/29 los - Gave patient AVS and calender per los. - Port placement scheduled by Caryl Pina for [place] next available would be the day after first treatment

## 2016-12-27 NOTE — Progress Notes (Signed)
DISCONTINUE ON PATHWAY REGIMEN - Non-Small Cell Lung     Administer weekly:     Paclitaxel        Dose Mod: None     Carboplatin        Dose Mod: None  **Always confirm dose/schedule in your pharmacy ordering system**    REASON: Disease Progression PRIOR TREATMENT: [id number]: Carboplatin AUC=2 + Paclitaxel 45 mg/m2 Weekly During Radiation TREATMENT RESPONSE: Progressive Disease (PD)  START ON PATHWAY REGIMEN - Non-Small Cell Lung     A cycle is every 21 days:     Carboplatin      Pemetrexed      Bevacizumab   **Always confirm dose/schedule in your pharmacy ordering system**    Patient Characteristics: Stage IV Metastatic, Non Squamous, Initial Chemotherapy/Immunotherapy, PS = 0, 1, PD-L1 Expression Positive 1-49% (TPS) / Negative / Not Tested / Awaiting Test Results AJCC T Category: T1a Current Disease Status: Distant Metastases AJCC N Category: N1 AJCC M Category: M1c AJCC 8 Stage Grouping: IVB Histology: Non Squamous Cell ROS1 Rearrangement Status: Negative T790M Mutation Status: Not Applicable - EGFR Mutation Negative/Unknown Other Mutations/Biomarkers: No Other Actionable Mutations PD-L1 Expression Status: Quantity Not Sufficient Chemotherapy/Immunotherapy LOT: Initial Chemotherapy/Immunotherapy Molecular Targeted Therapy: Not Appropriate ALK Translocation Status: Negative Would you be surprised if this patient died  in the next year? I would NOT be surprised if this patient died in the next year EGFR Mutation Status: Negative/Wild Type BRAF [id number] Mutation Status: Negative Performance Status: PS = 0, 1 Intent of Therapy: Non-Curative / Palliative Intent, Discussed with Patient

## 2016-12-27 NOTE — Progress Notes (Signed)
[place] [phone]   [phone]  OFFICE PROGRESS NOTE  [name], MD [place] [address]  DIAGNOSIS: Metastatic non-small cell lung cancer, adenocarcinoma initially diagnosed as Unresectable stage IIA (T1a, N1, M0) non-small cell lung cancer, adenocarcinoma presented with a right hilar mass and right hilar adenopathy diagnosed in September 2017. Guardant 360: Negative for EGFR, ALK, ROS 1 and BRAF mutations.  PRIOR THERAPY: Status post concurrent chemoradiation with weekly carboplatin for AUC of 2 and paclitaxel 45 MG/M2 for 7 weeks.  CURRENT THERAPY: Systemic chemotherapy with carboplatin for AUC of 5, Alimta 500 MG/M2 and Avastin 15 MG/KG every 3 weeks. First dose 01/08/2017.  INTERVAL HISTORY: Mark Howell 76 y.o. male returns to the clinic today for follow-up visit accompanied by his wife, daughter and son. The patient is feeling fine today with no specific complaints. He denied having any chest pain, shortness of breath, cough or hemoptysis. He denied having any fever or chills. He has no nausea, vomiting, diarrhea or constipation. He was found on previous CT scan of the chest concerning findings for disease recurrence. I ordered a PET scan as well as molecular study by Guardant 360 that were performed recently and he is here for evaluation and discussion of his results and treatment options.  MEDICAL HISTORY: Past Medical History:  Diagnosis Date  . Arthritis   . Complication of anesthesia    problems voiding post op  . Full dentures   . GERD (gastroesophageal reflux disease)   . History of radiation therapy 01/25/16-02/28/16   right lung 45 Gy  . History of radiation therapy 04/17/16-05/02/16   right lung boost 20 Gy in 10 fractions cumulative dose 65 gray  . HOH (hard of hearing)   . HTN (hypertension)   . Hyperlipidemia   . Lung mass 12/30/2015  . Odynophagia 04/06/2016  . Psoriatic arthritis ([place])   .  Snores   . Wears glasses     ALLERGIES:  is allergic to hydrocodone and oxycodone.  MEDICATIONS:  Current Outpatient Prescriptions  Medication Sig Dispense Refill  . acetaminophen (TYLENOL) 500 MG tablet Take 500-1,000 mg by mouth 2 (two) times daily as needed for mild pain.    Marland Kitchen aspirin EC 81 MG tablet Take 81 mg by mouth at bedtime.     Marland Kitchen aspirin-sod bicarb-citric acid (ALKA-SELTZER) 325 MG TBEF tablet Take 325 mg by mouth every 6 (six) hours as needed (indigestion).    . diphenhydramine-acetaminophen (TYLENOL PM) 25-500 MG TABS Take 1 tablet by mouth at bedtime. For sleep    . ENBREL SURECLICK 50 MG/ML injection Inject 50 mg into the skin once a week. Tuesday    . Homeopathic Products (LEG CRAMP COMPLEX PO) Take by mouth.    . Lutein 10 MG TABS Take by mouth.    . Multiple Vitamin (MULITIVITAMIN WITH MINERALS) TABS Take 1 tablet by mouth daily.    . pantoprazole (PROTONIX) 40 MG tablet Take 40 mg by mouth daily.     . pravastatin (PRAVACHOL) 40 MG tablet Take 40 mg by mouth at bedtime.     . predniSONE (DELTASONE) 10 MG tablet Take 5 mg by mouth daily.     . tamsulosin (FLOMAX) 0.4 MG CAPS capsule TAKE (1) CAPSULE DAILY, START 4 DAYS BEFORE PROCEDURE (Patient taking differently: take 1 capsule by mouth every morning) 30 capsule 2  . triamcinolone (NASACORT) 55 MCG/ACT AERO nasal inhaler Place into the nose.    . vitamin  B-12 (CYANOCOBALAMIN) 1000 MCG tablet Take 1,000 mcg by mouth daily.    Marland Kitchen nystatin (MYCOSTATIN/NYSTOP) powder Apply to affected area 3 times daily    . prochlorperazine (COMPAZINE) 10 MG tablet Take 1 tablet (10 mg total) by mouth every 6 (six) hours as needed for nausea or vomiting. (Patient not taking: Reported on 06/07/2016) 30 tablet 0   No current facility-administered medications for this visit.     SURGICAL HISTORY:  Past Surgical History:  Procedure Laterality Date  . COLONOSCOPY    . HEMORRHOID SURGERY N/A 10/01/2013   Procedure: EXAM UNDER ANESTHESIA  AND  EXCISIONAL HEMORRHOIDECTOMY WITH HEMORRHOID BANDING X 2;  Surgeon: Gayland Curry, MD;  Location: [facility];  Service: General;  Laterality: N/A;  . INGUINAL HERNIA REPAIR  01/04/2012   Procedure: LAPAROSCOPIC INGUINAL HERNIA;  Surgeon: Gayland Curry, MD,FACS;  Location: [facility];  Service: General;  Laterality: Right;  Marland Kitchen VIDEO BRONCHOSCOPY WITH ENDOBRONCHIAL ULTRASOUND N/A 12/31/2015   Procedure: VIDEO BRONCHOSCOPY WITH ENDOBRONCHIAL ULTRASOUND transbronchial biopsy of node 10 R lymph node;  Surgeon: Grace Isaac, MD;  Location: [facility];  Service: Thoracic;  Laterality: N/A;    REVIEW OF SYSTEMS:  Constitutional: positive for fatigue Eyes: negative Ears, nose, mouth, throat, and face: negative Respiratory: positive for dyspnea on exertion Cardiovascular: negative Gastrointestinal: negative Genitourinary:negative Integument/breast: negative Hematologic/lymphatic: negative Musculoskeletal:negative Neurological: negative Behavioral/Psych: negative Endocrine: negative Allergic/Immunologic: negative   PHYSICAL EXAMINATION: General appearance: alert, cooperative, fatigued and no distress Head: Normocephalic, without obvious abnormality, atraumatic Neck: no adenopathy, no JVD, supple, symmetrical, trachea midline and thyroid not enlarged, symmetric, no tenderness/mass/nodules Lymph nodes: Cervical, supraclavicular, and axillary nodes normal. Resp: clear to auscultation bilaterally Back: symmetric, no curvature. ROM normal. No CVA tenderness. Cardio: regular rate and rhythm, S1, S2 normal, no murmur, click, rub or gallop GI: soft, non-tender; bowel sounds normal; no masses,  no organomegaly Extremities: extremities normal, atraumatic, no cyanosis or edema Neurologic: Alert and oriented X 3, normal strength and tone. Normal symmetric reflexes. Normal coordination and gait  ECOG PERFORMANCE STATUS: 0 - Asymptomatic  Blood pressure 136/81, pulse 99, temperature 98.1 F (36.7 C),  temperature source Oral, resp. rate 18, weight 178 lb 8 oz (81 kg), SpO2 99 %.  LABORATORY DATA: Lab Results  Component Value Date   WBC 8.9 12/27/2016   HGB 15.0 12/27/2016   HCT 43.6 12/27/2016   MCV 91.6 12/27/2016   PLT 140 12/27/2016      Chemistry      Component Value Date/Time   NA 137 12/27/2016 0926   K 4.4 12/27/2016 0926   CL 99 (L) 03/05/2016 1344   CO2 26 12/27/2016 0926   BUN 28.5 (H) 12/27/2016 0926   CREATININE 1.6 (H) 12/27/2016 0926      Component Value Date/Time   CALCIUM 9.8 12/27/2016 0926   ALKPHOS 78 12/27/2016 0926   AST 20 12/27/2016 0926   ALT 15 12/27/2016 0926   BILITOT 0.61 12/27/2016 0926       RADIOGRAPHIC STUDIES: Ct Chest W Contrast  Result Date: 12/09/2016 CLINICAL DATA:  Unresectable stage IIA right lung adenocarcinoma diagnosed September 2017 status post concurrent chemoradiation therapy. Radiation therapy completed 05/02/2016. Patient presents for restaging on interval observation. EXAM: CT CHEST WITH CONTRAST TECHNIQUE: Multidetector CT imaging of the chest was performed during intravenous contrast administration. CONTRAST:  75 cc Isovue-300 IV. COMPARISON:  09/05/2016 chest CT. FINDINGS: Cardiovascular: Normal heart size. No significant pericardial fluid/thickening. Left anterior descending, left circumflex and right coronary atherosclerosis.  Atherosclerotic nonaneurysmal thoracic aorta. Normal caliber pulmonary arteries. No central pulmonary emboli. Mediastinum/Nodes: No discrete thyroid nodules. Unremarkable esophagus. No axillary adenopathy. Right hilar 2.8 x 1.8 cm mass (series 2/image 80), previously 2.2 x 1.2 cm, increased. Lungs/Pleura: No pneumothorax. No pleural effusion. Mild to moderate centrilobular emphysema. Solid 8 x 5 mm peripheral right middle lobe (series 5/image 90) and 3 mm peripheral right lower lobe (series 5/ image 101) pulmonary nodules are stable. There is continued evolution of sharply marginated postradiation change  in the parahilar right lung with increasing consolidation, volume loss and mild bronchiectasis. Mildly irregular parenchymal bands in the lingula and left lower lobe are stable and compatible with postinfectious/ postinflammatory scarring. No acute consolidative airspace disease or new significant pulmonary nodules. Upper abdomen: A few scattered nonobstructing 2 mm upper right renal stones. Stable mild scarring in the lateral right kidney. New 1.9 cm and 1.8 cm right adrenal nodules and new 3.2 cm left adrenal nodule. Musculoskeletal: No aggressive appearing focal osseous lesions. Mild thoracic spondylosis. IMPRESSION: 1. Interval growth of right hilar mass, 2.8 x 1.8 cm, suspicious for local tumor recurrence. 2. 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 3. Continued evolution of postradiation change in the parahilar right lung. 4. Subcentimeter pulmonary nodules in the right middle and right lower lobes are stable. 5. Three-vessel coronary atherosclerosis. 6. Nonobstructing right nephrolithiasis. Aortic Atherosclerosis (ICD10-I70.0) and Emphysema (ICD10-J43.9). Electronically Signed   By: Ilona Sorrel M.D.   On: 12/09/2016 18:45   Nm Pet Image Restag (ps) Skull Base To Thigh  Result Date: 12/23/2016 CLINICAL DATA:  Subsequent treatment strategy for non-small-cell lung cancer. EXAM: NUCLEAR MEDICINE PET SKULL BASE TO THIGH TECHNIQUE: 9.5 mCi F-18 FDG was injected intravenously. Full-ring PET imaging was performed from the skull base to thigh after the radiotracer. CT data was obtained and used for attenuation correction and anatomic localization. FASTING BLOOD GLUCOSE:  Value: 128 Mg/dl COMPARISON:  12/27/2015 FINDINGS: NECK: No hypermetabolic lymph nodes in the neck. CHEST: Central right hilar lesion is poorly discriminated on CT imaging given lack of intravenous contrast but probably measures about 2 x 2.8 cm. This is hypermetabolic with SUV max = 9.6. Surrounding interstitial and  airspace disease in the right parahilar lung is compatible with radiation fibrosis. Areas of apparent scarring in the left lung are stable without substantial hypermetabolism. ABDOMEN/PELVIS: Interval development of bilateral adrenal lesions measuring 2.9 x 1.9 cm on the right ( SUV max = 13) and 2.9 x 2.6 cm on the left ( SUV max = 13). No other hypermetabolic disease identified in the abdomen. Bilateral nonobstructing renal stones are evident. Atherosclerotic calcification noted in the wall of the abdominal aorta. Left colonic diverticulosis without diverticulitis. Prostate gland is enlarged. SKELETON: No focal hypermetabolic activity to suggest skeletal metastasis. IMPRESSION: 1. Persistent hypermetabolic right hilar lesion, similar to prior. Substantial interstitial and airspace disease in the right parahilar lung on today's study is compatible with post radiation fibrosis. 2. Interval development of bilateral hypermetabolic adrenal lesions consistent with metastatic involvement. 3.  Aortic Atherosclerois (ICD10-170.0) 4. Bilateral nonobstructing nephrolithiasis. Electronically Signed   By: Misty Stanley M.D.   On: 12/23/2016 15:10    ASSESSMENT AND PLAN:  This is a very pleasant 76 years old white male with unresectable a stage II a non-small cell lung cancer, adenocarcinoma presented with right hilar mass status post a course of concurrent chemoradiation with weekly carboplatin and paclitaxel. He has partial response to this treatment. The patient has been observation for the last few  months. 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 I ordered a PET scan that confirmed these findings with no other concerning findings for disease progression. I personally and independently reviewed the scan images and discuss the results and showed the images to the patient and his family. I discussed  with him several options for treatment of his condition. His Guardant 360 molecular study showed no action it will mutations. I gave the patient the option of palliative care versus consideration of palliative systemic chemotherapy was carboplatin for AUC of 5, Alimta 500 MG/M2 and Avastin 15 MG/KG every 3 weeks. The patient would not be a good candidate for immunotherapy right now because of his current treatment for rheumatoid arthritis with Enbrel. I discussed with the patient adverse effects of this treatment including but not limited to alopecia, myelosuppression, nausea and vomiting, peripheral neuropathy, liver or renal dysfunction in addition to the adverse effect of Avastin including pulmonary hemorrhage, GI perforation, wound healing delay, hypertension and proteinuria. We will arrange for the patient to receive vitamin B 12 injection today. The patient would also receive prescription for Compazine 10 mg by mouth every 6 hours as needed for nausea, Decadron 4 mg by mouth twice a day, the day before, day of and day after the chemotherapy in addition to folic acid 1 mg by mouth daily. He is expected to start the first cycle of this treatment on 01/08/2017. I will also refer the patient to interventional radiology for consideration of Port-A-Cath placement. The patient would come back for follow-up visit in one month for evaluation and management of any adverse effect of his treatment. He was advised to call immediately if he has any concerning symptoms in the interval. The patient voices understanding of current disease status and treatment options and is in agreement with the current care plan. All questions were answered. The patient knows to call the clinic with any problems, questions or concerns. We can certainly see the patient much sooner if necessary.  Disclaimer: This note was dictated with voice recognition software. Similar sounding words can inadvertently be transcribed and may not be  corrected upon review.

## 2016-12-28 ENCOUNTER — Other Ambulatory Visit: Payer: Self-pay | Admitting: Radiology

## 2017-01-02 ENCOUNTER — Other Ambulatory Visit: Payer: Self-pay | Admitting: Internal Medicine

## 2017-01-02 ENCOUNTER — Encounter (HOSPITAL_COMMUNITY): Payer: Self-pay

## 2017-01-02 ENCOUNTER — Ambulatory Visit (HOSPITAL_COMMUNITY)
Admission: RE | Admit: 2017-01-02 | Discharge: 2017-01-02 | Disposition: A | Discharge status: Home / Self Care | Payer: Medicare Other | Source: Ambulatory Visit | Attending: Internal Medicine | Admitting: Internal Medicine

## 2017-01-02 DIAGNOSIS — Z5111 Encounter for antineoplastic chemotherapy: Secondary | ICD-10-CM

## 2017-01-02 DIAGNOSIS — Z87891 Personal history of nicotine dependence: Secondary | ICD-10-CM | POA: Diagnosis not present

## 2017-01-02 DIAGNOSIS — E785 Hyperlipidemia, unspecified: Secondary | ICD-10-CM | POA: Insufficient documentation

## 2017-01-02 DIAGNOSIS — C3491 Malignant neoplasm of unspecified part of right bronchus or lung: Secondary | ICD-10-CM | POA: Insufficient documentation

## 2017-01-02 DIAGNOSIS — I10 Essential (primary) hypertension: Secondary | ICD-10-CM | POA: Insufficient documentation

## 2017-01-02 DIAGNOSIS — M1990 Unspecified osteoarthritis, unspecified site: Secondary | ICD-10-CM | POA: Diagnosis not present

## 2017-01-02 DIAGNOSIS — L4050 Arthropathic psoriasis, unspecified: Secondary | ICD-10-CM | POA: Insufficient documentation

## 2017-01-02 DIAGNOSIS — K219 Gastro-esophageal reflux disease without esophagitis: Secondary | ICD-10-CM | POA: Insufficient documentation

## 2017-01-02 HISTORY — PX: IR FLUORO GUIDE PORT INSERTION RIGHT: IMG5741

## 2017-01-02 HISTORY — PX: IR US GUIDE VASC ACCESS RIGHT: IMG2390

## 2017-01-02 LAB — BASIC METABOLIC PANEL
ANION GAP: 11 (ref 5–15)
BUN: 21 mg/dL — ABNORMAL HIGH (ref 6–20)
CO2: 22 mmol/L (ref 22–32)
Calcium: 9.4 mg/dL (ref 8.9–10.3)
Chloride: 103 mmol/L (ref 101–111)
Creatinine, Ser: 1.35 mg/dL — ABNORMAL HIGH (ref 0.61–1.24)
GFR calc Af Amer: 57 mL/min — ABNORMAL LOW (ref >60)
GFR, EST NON AFRICAN AMERICAN: 49 mL/min — AB (ref >60)
Glucose, Bld: 133 mg/dL — ABNORMAL HIGH (ref 65–99)
POTASSIUM: 3.9 mmol/L (ref 3.5–5.1)
Sodium: 136 mmol/L (ref 135–145)

## 2017-01-02 LAB — CBC
HEMATOCRIT: 40.3 % (ref 39.0–52.0)
HEMOGLOBIN: 13.9 g/dL (ref 13.0–17.0)
MCH: 31.2 pg (ref 26.0–34.0)
MCHC: 34.5 g/dL (ref 30.0–36.0)
MCV: 90.4 fL (ref 78.0–100.0)
Platelets: 130 10*3/uL — ABNORMAL LOW (ref 150–400)
RBC: 4.46 MIL/uL (ref 4.22–5.81)
RDW: 13.3 % (ref 11.5–15.5)
WBC: 10.7 10*3/uL — AB (ref 4.0–10.5)

## 2017-01-02 LAB — APTT: APTT: 33 s (ref 24–36)

## 2017-01-02 LAB — PROTIME-INR
INR: 1.02
Prothrombin Time: 13.3 seconds (ref 11.4–15.2)

## 2017-01-02 MED ORDER — MIDAZOLAM HCL 2 MG/2ML IJ SOLN
INTRAMUSCULAR | Status: AC
  Filled 2017-01-02: qty 4

## 2017-01-02 MED ORDER — MIDAZOLAM HCL 2 MG/2ML IJ SOLN
INTRAMUSCULAR | Status: AC | PRN
  Administered 2017-01-02: 0.5 mg via INTRAVENOUS
  Administered 2017-01-02: 1 mg via INTRAVENOUS

## 2017-01-02 MED ORDER — FENTANYL CITRATE (PF) 100 MCG/2ML IJ SOLN
INTRAMUSCULAR | Status: AC
  Filled 2017-01-02: qty 4

## 2017-01-02 MED ORDER — LIDOCAINE HCL (PF) 1 % IJ SOLN
INTRAMUSCULAR | Status: AC
  Filled 2017-01-02: qty 30

## 2017-01-02 MED ORDER — SODIUM CHLORIDE 0.9 % IV SOLN
INTRAVENOUS | Status: DC
  Administered 2017-01-02: 10 mL/h via INTRAVENOUS

## 2017-01-02 MED ORDER — LIDOCAINE HCL (PF) 1 % IJ SOLN
INTRAMUSCULAR | Status: AC | PRN
  Administered 2017-01-02: 15 mL

## 2017-01-02 MED ORDER — HEPARIN SOD (PORK) LOCK FLUSH 100 UNIT/ML IV SOLN
INTRAVENOUS | Status: AC
  Filled 2017-01-02: qty 5

## 2017-01-02 MED ORDER — CEFAZOLIN SODIUM-DEXTROSE 2-4 GM/100ML-% IV SOLN
2.0000 g | INTRAVENOUS | Status: AC
  Administered 2017-01-02: 2 g via INTRAVENOUS

## 2017-01-02 MED ORDER — CEFAZOLIN SODIUM-DEXTROSE 2-4 GM/100ML-% IV SOLN
INTRAVENOUS | Status: AC
  Administered 2017-01-02: 2 g via INTRAVENOUS
  Filled 2017-01-02: qty 100

## 2017-01-02 MED ORDER — FENTANYL CITRATE (PF) 100 MCG/2ML IJ SOLN
INTRAMUSCULAR | Status: AC | PRN
  Administered 2017-01-02: 25 ug via INTRAVENOUS
  Administered 2017-01-02: 50 ug via INTRAVENOUS
  Administered 2017-01-02: 25 ug via INTRAVENOUS

## 2017-01-02 MED ORDER — HEPARIN SOD (PORK) LOCK FLUSH 100 UNIT/ML IV SOLN
INTRAVENOUS | Status: AC | PRN
  Administered 2017-01-02: 500 [IU] via INTRAVENOUS

## 2017-01-02 NOTE — Sedation Documentation (Signed)
Patient is resting comfortably. 

## 2017-01-02 NOTE — Sedation Documentation (Signed)
Patient denies pain and is resting comfortably.  

## 2017-01-02 NOTE — Discharge Instructions (Signed)
Implanted Port Insertion, Care After °This sheet gives you information about how to care for yourself after your procedure. Your health care provider may also give you more specific instructions. If you have problems or questions, contact your health care provider. °What can I expect after the procedure? °After your procedure, it is common to have: °· Discomfort at the port insertion site. °· Bruising on the skin over the port. This should improve over 3-4 days. ° °Follow these instructions at home: °Port care °· After your port is placed, you will get a manufacturer's information card. The card has information about your port. Keep this card with you at all times. °· Take care of the port as told by your health care provider. Ask your health care provider if you or a family member can get training for taking care of the port at home. A home health care nurse may also take care of the port. °· Make sure to remember what type of port you have. °Incision care °· Follow instructions from your health care provider about how to take care of your port insertion site. Make sure you: °? Wash your hands with soap and water before you change your bandage (dressing). If soap and water are not available, use hand sanitizer. °? Change your dressing as told by your health care provider. °? Leave stitches (sutures), skin glue, or adhesive strips in place. These skin closures may need to stay in place for 2 weeks or longer. If adhesive strip edges start to loosen and curl up, you may trim the loose edges. Do not remove adhesive strips completely unless your health care provider tells you to do that. °· Check your port insertion site every day for signs of infection. Check for: °? More redness, swelling, or pain. °? More fluid or blood. °? Warmth. °? Pus or a bad smell. °General instructions °· Do not take baths, swim, or use a hot tub until your health care provider approves. °· Do not lift anything that is heavier than 10 lb (4.5  kg) for a week, or as told by your health care provider. °· Ask your health care provider when it is okay to: °? Return to work or school. °? Resume usual physical activities or sports. °· Do not drive for 24 hours if you were given a medicine to help you relax (sedative). °· Take over-the-counter and prescription medicines only as told by your health care provider. °· Wear a medical alert bracelet in case of an emergency. This will tell any health care providers that you have a port. °· Keep all follow-up visits as told by your health care provider. This is important. °Contact a health care provider if: °· You cannot flush your port with saline as directed, or you cannot draw blood from the port. °· You have a fever or chills. °· You have more redness, swelling, or pain around your port insertion site. °· You have more fluid or blood coming from your port insertion site. °· Your port insertion site feels warm to the touch. °· You have pus or a bad smell coming from the port insertion site. °Get help right away if: °· You have chest pain or shortness of breath. °· You have bleeding from your port that you cannot control. °Summary °· Take care of the port as told by your health care provider. °· Change your dressing as told by your health care provider. °· Keep all follow-up visits as told by your health care provider. °  This information is not intended to replace advice given to you by your health care provider. Make sure you discuss any questions you have with your health care provider. °Document Released: 02/05/2013 Document Revised: 03/08/2016 Document Reviewed: 03/08/2016 °Elsevier Interactive Patient Education © 2017 [employer] ° °

## 2017-01-02 NOTE — Procedures (Signed)
RIJV PAC SVC RA EBL 0 Cop 0

## 2017-01-02 NOTE — H&P (Signed)
Chief Complaint: [place] lung cancer  Referring Physician:Dr. Curt Bears  Supervising Physician: Marybelle Killings  Patient Status: [facility] - Out-pt  HPI: Mark Howell is a 76 y.o. male who was diagnosed with [place] lung cancer about a year ago.  He is followed by Dr. Julien Nordmann.  He has already received 7 rounds of chemotherapy as well as 25 cycles of radiation to the right side of his chest.  He presents today for [facility] placement for more chemotherapy. He denies any other symptoms such as CP, fevers, chills, or SOB.  Past Medical History:  Past Medical History:  Diagnosis Date  . Arthritis   . Complication of anesthesia    problems voiding post op  . Full dentures   . GERD (gastroesophageal reflux disease)   . History of radiation therapy 01/25/16-02/28/16   right lung 45 Gy  . History of radiation therapy 04/17/16-05/02/16   right lung boost 20 Gy in 10 fractions cumulative dose 65 gray  . HOH (hard of hearing)   . HTN (hypertension)   . Hyperlipidemia   . Lung mass 12/30/2015  . Odynophagia 04/06/2016  . Psoriatic arthritis ([place])   . Snores   . Wears glasses     Past Surgical History:  Past Surgical History:  Procedure Laterality Date  . COLONOSCOPY    . HEMORRHOID SURGERY N/A 10/01/2013   Procedure: EXAM UNDER ANESTHESIA  AND EXCISIONAL HEMORRHOIDECTOMY WITH HEMORRHOID BANDING X 2;  Surgeon: Gayland Curry, MD;  Location: [facility];  Service: General;  Laterality: N/A;  . INGUINAL HERNIA REPAIR  01/04/2012   Procedure: LAPAROSCOPIC INGUINAL HERNIA;  Surgeon: Gayland Curry, MD,FACS;  Location: [facility];  Service: General;  Laterality: Right;  Marland Kitchen VIDEO BRONCHOSCOPY WITH ENDOBRONCHIAL ULTRASOUND N/A 12/31/2015   Procedure: VIDEO BRONCHOSCOPY WITH ENDOBRONCHIAL ULTRASOUND transbronchial biopsy of node 10 R lymph node;  Surgeon: Grace Isaac, MD;  Location: [facility];  Service: Thoracic;  Laterality: N/A;    Family History:  Family History  Problem Relation Age of Onset  .  Stroke Father   . Diabetes Father   . Heart disease Father   . Cancer Brother        pancreatic    Social History:  reports that he quit smoking about 13 months ago. His smoking use included Cigarettes. He has a 25.00 pack-year smoking history. He has never used smokeless tobacco. He reports that he drinks alcohol. He reports that he does not use drugs.  Allergies:  Allergies  Allergen Reactions  . Hydrocodone Rash  . Oxycodone Rash    Medications: Medications reviewed in epic  Please HPI for pertinent positives, otherwise complete 10 system ROS negative.  Mallampati Score: MD Evaluation Airway: WNL Heart: WNL Abdomen: WNL Chest/ Lungs: WNL ASA  Classification: 2 Mallampati/Airway Score: One  Physical Exam: BP (!) 153/88 (BP Location: Right Arm)   Pulse 88   Temp 97.7 F (36.5 C) (Oral)   Ht _0  (1.854 m)   Wt 180 lb (81.6 kg)   SpO2 100%   BMI 23.75 kg/m  Body mass index is 23.75 kg/m. General: pleasant, WD, WN white male who is laying in bed in NAD HEENT: head is normocephalic, atraumatic.  Sclera are noninjected.  PERRL.  Ears and nose without any masses or lesions.  Mouth is pink and moist Heart: regular, rate, and rhythm.  Normal s1,s2. No obvious murmurs, gallops, or rubs noted.  Palpable radial and pedal pulses bilaterally Lungs: CTAB, no wheezes,  rhonchi, or rales noted.  Respiratory effort nonlabored Abd: soft, NT, ND, +BS, no masses, hernias, or organomegaly Psych: A&Ox3 with an appropriate affect.   Labs: Results for orders placed or performed during the hospital encounter of 01/02/17 (from the past 48 hour(s))  APTT     Status: None   Collection Time: 01/02/17  7:31 AM  Result Value Ref Range   aPTT 33 24 - 36 seconds  Basic metabolic panel     Status: Abnormal   Collection Time: 01/02/17  7:31 AM  Result Value Ref Range   Sodium 136 135 - 145 mmol/L   Potassium 3.9 3.5 - 5.1 mmol/L   Chloride 103 101 - 111 mmol/L   CO2 22 22 - 32 mmol/L    Glucose, Bld 133 (H) 65 - 99 mg/dL   BUN 21 (H) 6 - 20 mg/dL   Creatinine, Ser 1.35 (H) 0.61 - 1.24 mg/dL   Calcium 9.4 8.9 - 10.3 mg/dL   GFR calc non Af Amer 49 (L) >60 mL/min   GFR calc Af Amer 57 (L) >60 mL/min    Comment: (NOTE) The eGFR has been calculated using the CKD EPI equation. This calculation has not been validated in all clinical situations. eGFR's persistently <60 mL/min signify possible Chronic Kidney Disease.    Anion gap 11 5 - 15  CBC     Status: Abnormal   Collection Time: 01/02/17  7:31 AM  Result Value Ref Range   WBC 10.7 (H) 4.0 - 10.5 K/uL   RBC 4.46 4.22 - 5.81 MIL/uL   Hemoglobin 13.9 13.0 - 17.0 g/dL   HCT 40.3 39.0 - 52.0 %   MCV 90.4 78.0 - 100.0 fL   MCH 31.2 26.0 - 34.0 pg   MCHC 34.5 30.0 - 36.0 g/dL   RDW 13.3 11.5 - 15.5 %   Platelets 130 (L) 150 - 400 K/uL  Protime-INR     Status: None   Collection Time: 01/02/17  7:31 AM  Result Value Ref Range   Prothrombin Time 13.3 11.4 - 15.2 seconds   INR 1.02     Imaging: No results found.  Assessment/Plan 1. [place] lung cancer  We will plan to proceed with PAC placement today.  His labs and vitals have been reviewed.  Risks and benefits discussed with the patient including, but not limited to bleeding, infection, pneumothorax, or fibrin sheath development and need for additional procedures. All of the patient's questions were answered, patient is agreeable to proceed. Consent signed and in chart.  Thank you for this interesting consult.  I greatly enjoyed meeting Mark Howell and look forward to participating in their care.  A copy of this report was sent to the requesting provider on this date.  Electronically Signed: Henreitta Cea 01/02/2017, 9:15 AM   I spent a total of  30 Minutes   in face to face in clinical consultation, greater than 50% of which was counseling/coordinating care for non-small cell lung cancer

## 2017-01-02 NOTE — Progress Notes (Signed)
D/C instructions discussed and reviewed pt and family verbalize understanding.  PIV d/c'd site WNL. D/C home via wheelchair

## 2017-01-08 ENCOUNTER — Other Ambulatory Visit (HOSPITAL_BASED_OUTPATIENT_CLINIC_OR_DEPARTMENT_OTHER): Payer: Medicare Other

## 2017-01-08 ENCOUNTER — Ambulatory Visit (HOSPITAL_BASED_OUTPATIENT_CLINIC_OR_DEPARTMENT_OTHER): Payer: Medicare Other

## 2017-01-08 VITALS — BP 136/82 | HR 91 | Temp 97.9°F | Resp 18

## 2017-01-08 DIAGNOSIS — C3401 Malignant neoplasm of right main bronchus: Secondary | ICD-10-CM

## 2017-01-08 DIAGNOSIS — Z5112 Encounter for antineoplastic immunotherapy: Secondary | ICD-10-CM | POA: Diagnosis not present

## 2017-01-08 DIAGNOSIS — Z5111 Encounter for antineoplastic chemotherapy: Secondary | ICD-10-CM

## 2017-01-08 DIAGNOSIS — C3491 Malignant neoplasm of unspecified part of right bronchus or lung: Secondary | ICD-10-CM

## 2017-01-08 LAB — UA PROTEIN, DIPSTICK - CHCC: PROTEIN: NEGATIVE mg/dL

## 2017-01-08 LAB — CBC WITH DIFFERENTIAL/PLATELET
BASO%: 0.5 % (ref 0.0–2.0)
Basophils Absolute: 0.1 10*3/uL (ref 0.0–0.1)
EOS%: 0 % (ref 0.0–7.0)
Eosinophils Absolute: 0 10*3/uL (ref 0.0–0.5)
HCT: 42.2 % (ref 38.4–49.9)
HGB: 14.4 g/dL (ref 13.0–17.1)
LYMPH%: 6.6 % — AB (ref 14.0–49.0)
MCH: 31.1 pg (ref 27.2–33.4)
MCHC: 34 g/dL (ref 32.0–36.0)
MCV: 91.5 fL (ref 79.3–98.0)
MONO#: 0.3 10*3/uL (ref 0.1–0.9)
MONO%: 2.7 % (ref 0.0–14.0)
NEUT#: 10.2 10*3/uL — ABNORMAL HIGH (ref 1.5–6.5)
NEUT%: 90.2 % — ABNORMAL HIGH (ref 39.0–75.0)
Platelets: 141 10*3/uL (ref 140–400)
RBC: 4.61 10*6/uL (ref 4.20–5.82)
RDW: 13.3 % (ref 11.0–14.6)
WBC: 11.4 10*3/uL — AB (ref 4.0–10.3)
lymph#: 0.8 10*3/uL — ABNORMAL LOW (ref 0.9–3.3)

## 2017-01-08 MED ORDER — BEVACIZUMAB CHEMO INJECTION 400 MG/16ML
14.8000 mg/kg | Freq: Once | INTRAVENOUS | Status: AC
  Administered 2017-01-08: 1200 mg via INTRAVENOUS
  Filled 2017-01-08: qty 48

## 2017-01-08 MED ORDER — PEMETREXED DISODIUM CHEMO INJECTION 500 MG
490.0000 mg/m2 | Freq: Once | INTRAVENOUS | Status: AC
  Administered 2017-01-08: 1000 mg via INTRAVENOUS
  Filled 2017-01-08: qty 40

## 2017-01-08 MED ORDER — HEPARIN SOD (PORK) LOCK FLUSH 100 UNIT/ML IV SOLN
500.0000 [IU] | Freq: Once | INTRAVENOUS | Status: AC | PRN
  Administered 2017-01-08: 500 [IU]
  Filled 2017-01-08: qty 5

## 2017-01-08 MED ORDER — SODIUM CHLORIDE 0.9% FLUSH
10.0000 mL | INTRAVENOUS | Status: DC | PRN
  Administered 2017-01-08: 10 mL
  Filled 2017-01-08: qty 10

## 2017-01-08 MED ORDER — PALONOSETRON HCL INJECTION 0.25 MG/5ML
0.2500 mg | Freq: Once | INTRAVENOUS | Status: AC
  Administered 2017-01-08: 0.25 mg via INTRAVENOUS

## 2017-01-08 MED ORDER — SODIUM CHLORIDE 0.9 % IV SOLN
Freq: Once | INTRAVENOUS | Status: AC
  Administered 2017-01-08: 10:00:00 via INTRAVENOUS
  Filled 2017-01-08: qty 5

## 2017-01-08 MED ORDER — SODIUM CHLORIDE 0.9 % IV SOLN
400.0000 mg | Freq: Once | INTRAVENOUS | Status: AC
  Administered 2017-01-08: 400 mg via INTRAVENOUS
  Filled 2017-01-08: qty 40

## 2017-01-08 MED ORDER — PALONOSETRON HCL INJECTION 0.25 MG/5ML
INTRAVENOUS | Status: AC
  Filled 2017-01-08: qty 5

## 2017-01-08 MED ORDER — SODIUM CHLORIDE 0.9 % IV SOLN
Freq: Once | INTRAVENOUS | Status: AC
  Administered 2017-01-08: 10:00:00 via INTRAVENOUS

## 2017-01-08 NOTE — Progress Notes (Signed)
Okay to treat today with labs, CMP from 12-27-16, per Dr. Julien Nordmann.

## 2017-01-08 NOTE — Patient Instructions (Signed)
[facility] Discharge Instructions for Patients Receiving Chemotherapy  Today you received the following chemotherapy agents: Carboplatin, Avastin and Altima.  To help prevent nausea and vomiting after your treatment, we encourage you to take your nausea medication as directed. No Zofran for 3 days. Take Compazine instead.    If you develop nausea and vomiting that is not controlled by your nausea medication, call the clinic.   BELOW ARE SYMPTOMS THAT SHOULD BE REPORTED IMMEDIATELY:  *FEVER GREATER THAN 100.5 F  *CHILLS WITH OR WITHOUT FEVER  NAUSEA AND VOMITING THAT IS NOT CONTROLLED WITH YOUR NAUSEA MEDICATION  *UNUSUAL SHORTNESS OF BREATH  *UNUSUAL BRUISING OR BLEEDING  TENDERNESS IN MOUTH AND THROAT WITH OR WITHOUT PRESENCE OF ULCERS  *URINARY PROBLEMS  *BOWEL PROBLEMS  UNUSUAL RASH Items with * indicate a potential emergency and should be followed up as soon as possible.  Feel free to call the clinic you have any questions or concerns. The clinic phone number is [phone].  Please show the [facility] at check-in to the Emergency Department and triage nurse.  Bevacizumab injection What is this medicine? BEVACIZUMAB (be va SIZ yoo mab) is a monoclonal antibody. It is used to treat many types of cancer. This medicine may be used for other purposes; ask your health care provider or pharmacist if you have questions. COMMON BRAND NAME(S): Avastin What should I tell my health care provider before I take this medicine? They need to know if you have any of these conditions: -diabetes -heart disease -high blood pressure -history of coughing up blood -prior anthracycline chemotherapy (e.g., doxorubicin, daunorubicin, epirubicin) -recent or ongoing radiation therapy -recent or planning to have surgery -stroke -an unusual or allergic reaction to bevacizumab, hamster proteins, mouse proteins, other medicines, foods, dyes, or preservatives -pregnant or  trying to get pregnant -breast-feeding How should I use this medicine? This medicine is for infusion into a vein. It is given by a health care professional in a hospital or clinic setting. Talk to your pediatrician regarding the use of this medicine in children. Special care may be needed. Overdosage: If you think you have taken too much of this medicine contact a poison control center or emergency room at once. NOTE: This medicine is only for you. Do not share this medicine with others. What if I miss a dose? It is important not to miss your dose. Call your doctor or health care professional if you are unable to keep an appointment. What may interact with this medicine? Interactions are not expected. This list may not describe all possible interactions. Give your health care provider a list of all the medicines, herbs, non-prescription drugs, or dietary supplements you use. Also tell them if you smoke, drink alcohol, or use illegal drugs. Some items may interact with your medicine. What should I watch for while using this medicine? Your condition will be monitored carefully while you are receiving this medicine. You will need important blood work and urine testing done while you are taking this medicine. This medicine may increase your risk to bruise or bleed. Call your doctor or health care professional if you notice any unusual bleeding. This medicine should be started at least 28 days following major surgery and the site of the surgery should be totally healed. Check with your doctor before scheduling dental work or surgery while you are receiving this treatment. Talk to your doctor if you have recently had surgery or if you have a wound that has not healed. Do  not become pregnant while taking this medicine or for 6 months after stopping it. Women should inform their doctor if they wish to become pregnant or think they might be pregnant. There is a potential for serious side effects to an unborn  child. Talk to your health care professional or pharmacist for more information. Do not breast-feed an infant while taking this medicine and for 6 months after the last dose. This medicine has caused ovarian failure in some women. This medicine may interfere with the ability to have a child. You should talk to your doctor or health care professional if you are concerned about your fertility. What side effects may I notice from receiving this medicine? Side effects that you should report to your doctor or health care professional as soon as possible: -allergic reactions like skin rash, itching or hives, swelling of the face, lips, or tongue -chest pain or chest tightness -chills -coughing up blood -high fever -seizures -severe constipation -signs and symptoms of bleeding such as bloody or black, tarry stools; red or dark-brown urine; spitting up blood or brown material that looks like coffee grounds; red spots on the skin; unusual bruising or bleeding from the eye, gums, or nose -signs and symptoms of a blood clot such as breathing problems; chest pain; severe, sudden headache; pain, swelling, warmth in the leg -signs and symptoms of a stroke like changes in vision; confusion; trouble speaking or understanding; severe headaches; sudden numbness or weakness of the face, arm or leg; trouble walking; dizziness; loss of balance or coordination -stomach pain -sweating -swelling of legs or ankles -vomiting -weight gain Side effects that usually do not require medical attention (report to your doctor or health care professional if they continue or are bothersome): -back pain -changes in taste -decreased appetite -dry skin -nausea -tiredness This list may not describe all possible side effects. Call your doctor for medical advice about side effects. You may report side effects to FDA at 1-800-FDA-1088. Where should I keep my medicine? This drug is given in a hospital or clinic and will not be  stored at home. NOTE: This sheet is a summary. It may not cover all possible information. If you have questions about this medicine, talk to your doctor, pharmacist, or health care provider.  2018 [employer] Standard (2016-04-14 14:33:29) Pemetrexed injection What is this medicine? PEMETREXED (PEM e TREX ed) is a chemotherapy drug used to treat lung cancers like non-small cell lung cancer and mesothelioma. It may also be used to treat other cancers. This medicine may be used for other purposes; ask your health care provider or pharmacist if you have questions. COMMON BRAND NAME(S): Alimta What should I tell my health care provider before I take this medicine? They need to know if you have any of these conditions: -infection (especially a virus infection such as chickenpox, cold sores, or herpes) -kidney disease -low blood counts, like low white cell, platelet, or red cell counts -lung or breathing disease, like asthma -radiation therapy -an unusual or allergic reaction to pemetrexed, other medicines, foods, dyes, or preservative -pregnant or trying to get pregnant -breast-feeding How should I use this medicine? This drug is given as an infusion into a vein. It is administered in a hospital or clinic by a specially trained health care professional. Talk to your pediatrician regarding the use of this medicine in children. Special care may be needed. Overdosage: If you think you have taken too much of this medicine contact a poison control center or emergency room at  once. NOTE: This medicine is only for you. Do not share this medicine with others. What if I miss a dose? It is important not to miss your dose. Call your doctor or health care professional if you are unable to keep an appointment. What may interact with this medicine? This medicine may interact with the following medications: -Ibuprofen This list may not describe all possible interactions. Give your health care provider a  list of all the medicines, herbs, non-prescription drugs, or dietary supplements you use. Also tell them if you smoke, drink alcohol, or use illegal drugs. Some items may interact with your medicine. What should I watch for while using this medicine? Visit your doctor for checks on your progress. This drug may make you feel generally unwell. This is not uncommon, as chemotherapy can affect healthy cells as well as cancer cells. Report any side effects. Continue your course of treatment even though you feel ill unless your doctor tells you to stop. In some cases, you may be given additional medicines to help with side effects. Follow all directions for their use. Call your doctor or health care professional for advice if you get a fever, chills or sore throat, or other symptoms of a cold or flu. Do not treat yourself. This drug decreases your body's ability to fight infections. Try to avoid being around people who are sick. This medicine may increase your risk to bruise or bleed. Call your doctor or health care professional if you notice any unusual bleeding. Be careful brushing and flossing your teeth or using a toothpick because you may get an infection or bleed more easily. If you have any dental work done, tell your dentist you are receiving this medicine. Avoid taking products that contain aspirin, acetaminophen, ibuprofen, naproxen, or ketoprofen unless instructed by your doctor. These medicines may hide a fever. Call your doctor or health care professional if you get diarrhea or mouth sores. Do not treat yourself. To protect your kidneys, drink water or other fluids as directed while you are taking this medicine. Do not become pregnant while taking this medicine or for 6 months after stopping it. Women should inform their doctor if they wish to become pregnant or think they might be pregnant. Men should not father a child while taking this medicine and for 3 months after stopping it. This may  interfere with the ability to father a child. You should talk to your doctor or health care professional if you are concerned about your fertility. There is a potential for serious side effects to an unborn child. Talk to your health care professional or pharmacist for more information. Do not breast-feed an infant while taking this medicine or for 1 week after stopping it. What side effects may I notice from receiving this medicine? Side effects that you should report to your doctor or health care professional as soon as possible: -allergic reactions like skin rash, itching or hives, swelling of the face, lips, or tongue -breathing problems -redness, blistering, peeling or loosening of the skin, including inside the mouth -signs and symptoms of bleeding such as bloody or black, tarry stools; red or dark-brown urine; spitting up blood or brown material that looks like coffee grounds; red spots on the skin; unusual bruising or bleeding from the eye, gums, or nose -signs and symptoms of infection like fever or chills; cough; sore throat; pain or trouble passing urine -signs and symptoms of kidney injury like trouble passing urine or change in the amount of urine -signs  and symptoms of liver injury like dark yellow or brown urine; general ill feeling or flu-like symptoms; light-colored stools; loss of appetite; nausea; right upper belly pain; unusually weak or tired; yellowing of the eyes or skin Side effects that usually do not require medical attention (report to your doctor or health care professional if they continue or are bothersome): -constipation -dizziness -mouth sores -nausea, vomiting -pain, tingling, numbness in the hands or feet -unusually weak or tired This list may not describe all possible side effects. Call your doctor for medical advice about side effects. You may report side effects to FDA at 1-800-FDA-1088. Where should I keep my medicine? This drug is given in a hospital or  clinic and will not be stored at home. NOTE: This sheet is a summary. It may not cover all possible information. If you have questions about this medicine, talk to your doctor, pharmacist, or health care provider.  2018 [employer] Standard (2016-02-15 18:51:46)

## 2017-01-09 ENCOUNTER — Telehealth: Payer: Self-pay | Admitting: Medical Oncology

## 2017-01-09 NOTE — Telephone Encounter (Signed)
Doing well after chemo . Had a "little nausea this am ,took compazine and feeling better now". He is eating and drinking well.

## 2017-01-15 ENCOUNTER — Other Ambulatory Visit (HOSPITAL_BASED_OUTPATIENT_CLINIC_OR_DEPARTMENT_OTHER): Payer: Medicare Other

## 2017-01-15 DIAGNOSIS — Z5111 Encounter for antineoplastic chemotherapy: Secondary | ICD-10-CM

## 2017-01-15 DIAGNOSIS — C3491 Malignant neoplasm of unspecified part of right bronchus or lung: Secondary | ICD-10-CM

## 2017-01-15 DIAGNOSIS — C3401 Malignant neoplasm of right main bronchus: Secondary | ICD-10-CM | POA: Diagnosis not present

## 2017-01-15 LAB — CBC WITH DIFFERENTIAL/PLATELET
BASO%: 0.4 % (ref 0.0–2.0)
BASOS ABS: 0 10*3/uL (ref 0.0–0.1)
EOS%: 4.7 % (ref 0.0–7.0)
Eosinophils Absolute: 0.3 10*3/uL (ref 0.0–0.5)
HEMATOCRIT: 44.7 % (ref 38.4–49.9)
HEMOGLOBIN: 15.3 g/dL (ref 13.0–17.1)
LYMPH#: 0.9 10*3/uL (ref 0.9–3.3)
LYMPH%: 12.4 % — ABNORMAL LOW (ref 14.0–49.0)
MCH: 31 pg (ref 27.2–33.4)
MCHC: 34.2 g/dL (ref 32.0–36.0)
MCV: 90.4 fL (ref 79.3–98.0)
MONO#: 0.4 10*3/uL (ref 0.1–0.9)
MONO%: 5.2 % (ref 0.0–14.0)
NEUT#: 5.4 10*3/uL (ref 1.5–6.5)
NEUT%: 77.3 % — AB (ref 39.0–75.0)
PLATELETS: 105 10*3/uL — AB (ref 140–400)
RBC: 4.94 10*6/uL (ref 4.20–5.82)
RDW: 13.1 % (ref 11.0–14.6)
WBC: 7 10*3/uL (ref 4.0–10.3)

## 2017-01-15 LAB — COMPREHENSIVE METABOLIC PANEL
ALT: 34 U/L (ref 0–55)
ANION GAP: 11 meq/L (ref 3–11)
AST: 35 U/L — ABNORMAL HIGH (ref 5–34)
Albumin: 3.7 g/dL (ref 3.5–5.0)
Alkaline Phosphatase: 90 U/L (ref 40–150)
BILIRUBIN TOTAL: 0.52 mg/dL (ref 0.20–1.20)
BUN: 23.8 mg/dL (ref 7.0–26.0)
CHLORIDE: 101 meq/L (ref 98–109)
CO2: 23 meq/L (ref 22–29)
Calcium: 9.4 mg/dL (ref 8.4–10.4)
Creatinine: 1.4 mg/dL — ABNORMAL HIGH (ref 0.7–1.3)
EGFR: 50 mL/min/{1.73_m2} — ABNORMAL LOW (ref >90)
GLUCOSE: 188 mg/dL — AB (ref 70–140)
POTASSIUM: 4.7 meq/L (ref 3.5–5.1)
SODIUM: 134 meq/L — AB (ref 136–145)
TOTAL PROTEIN: 7.7 g/dL (ref 6.4–8.3)

## 2017-01-16 LAB — GUARDANT 360

## 2017-01-22 ENCOUNTER — Telehealth: Payer: Self-pay | Admitting: *Deleted

## 2017-01-22 ENCOUNTER — Other Ambulatory Visit (HOSPITAL_BASED_OUTPATIENT_CLINIC_OR_DEPARTMENT_OTHER): Payer: Medicare Other

## 2017-01-22 ENCOUNTER — Ambulatory Visit (HOSPITAL_BASED_OUTPATIENT_CLINIC_OR_DEPARTMENT_OTHER): Payer: Medicare Other | Admitting: Medical

## 2017-01-22 VITALS — BP 123/88 | HR 93 | Temp 97.8°F | Resp 17 | Ht 73.0 in | Wt 179.2 lb

## 2017-01-22 DIAGNOSIS — C3401 Malignant neoplasm of right main bronchus: Secondary | ICD-10-CM

## 2017-01-22 DIAGNOSIS — K1231 Oral mucositis (ulcerative) due to antineoplastic therapy: Secondary | ICD-10-CM

## 2017-01-22 DIAGNOSIS — Z5111 Encounter for antineoplastic chemotherapy: Secondary | ICD-10-CM

## 2017-01-22 DIAGNOSIS — C3491 Malignant neoplasm of unspecified part of right bronchus or lung: Secondary | ICD-10-CM

## 2017-01-22 LAB — CBC WITH DIFFERENTIAL/PLATELET
BASO%: 0 % (ref 0.0–2.0)
BASOS ABS: 0 10*3/uL (ref 0.0–0.1)
EOS ABS: 0.1 10*3/uL (ref 0.0–0.5)
EOS%: 1.8 % (ref 0.0–7.0)
HEMATOCRIT: 41.4 % (ref 38.4–49.9)
HEMOGLOBIN: 14.1 g/dL (ref 13.0–17.1)
LYMPH#: 0.7 10*3/uL — AB (ref 0.9–3.3)
LYMPH%: 13.1 % — ABNORMAL LOW (ref 14.0–49.0)
MCH: 31.2 pg (ref 27.2–33.4)
MCHC: 34.1 g/dL (ref 32.0–36.0)
MCV: 91.6 fL (ref 79.3–98.0)
MONO#: 0.4 10*3/uL (ref 0.1–0.9)
MONO%: 8.6 % (ref 0.0–14.0)
NEUT%: 76.5 % — ABNORMAL HIGH (ref 39.0–75.0)
NEUTROS ABS: 3.9 10*3/uL (ref 1.5–6.5)
Platelets: 83 10*3/uL — ABNORMAL LOW (ref 140–400)
RBC: 4.52 10*6/uL (ref 4.20–5.82)
RDW: 13.5 % (ref 11.0–14.6)
WBC: 5.1 10*3/uL (ref 4.0–10.3)

## 2017-01-22 LAB — COMPREHENSIVE METABOLIC PANEL
ALT: 26 U/L (ref 0–55)
ANION GAP: 9 meq/L (ref 3–11)
AST: 31 U/L (ref 5–34)
Albumin: 3.8 g/dL (ref 3.5–5.0)
Alkaline Phosphatase: 76 U/L (ref 40–150)
BILIRUBIN TOTAL: 0.48 mg/dL (ref 0.20–1.20)
BUN: 18 mg/dL (ref 7.0–26.0)
CALCIUM: 8.9 mg/dL (ref 8.4–10.4)
CHLORIDE: 106 meq/L (ref 98–109)
CO2: 24 meq/L (ref 22–29)
CREATININE: 1.5 mg/dL — AB (ref 0.7–1.3)
EGFR: 45 mL/min/{1.73_m2} — AB (ref >90)
Glucose: 154 mg/dl — ABNORMAL HIGH (ref 70–140)
Potassium: 4.2 mEq/L (ref 3.5–5.1)
Sodium: 139 mEq/L (ref 136–145)
Total Protein: 7.3 g/dL (ref 6.4–8.3)

## 2017-01-22 MED ORDER — MAGIC MOUTHWASH: 5.0000 mL | Freq: Four times a day (QID) | ORAL | 2 refills | Status: DC | PRN

## 2017-01-22 MED ORDER — LIDOCAINE VISCOUS 2 % MT SOLN: 20.0000 mL | OROMUCOSAL | 2 refills | Status: DC | PRN

## 2017-01-22 MED ORDER — MAGIC MOUTHWASH: 5.0000 mL | Freq: Four times a day (QID) | ORAL | 2 refills | Status: AC | PRN

## 2017-01-22 NOTE — Telephone Encounter (Signed)
Pt filled out walk in form to be seen in [facility] today. C/o mouth sores, burning sensation on tongue and lips.  Urgent LOS sent for pt to be seen this am.

## 2017-01-22 NOTE — Progress Notes (Signed)
[facility]pt[facility] Management Clinic Progress Note   Mark Howell [id number] 1941/01/08 76 y.o.  [name] is managed by Dr. Eilleen Kempf  Actively treated with chemotherapy: yes  Current Therapy: Avastin, carboplatin and Alimta  Last Treated: 09 / 10 / 2018  Assessment/Plan:   Mucositis due to antineoplastic therapy - Plan: lidocaine (XYLOCAINE) 2 % solution, magic mouthwash SOLN  Please see After Visit Summary for patient specific instructions.  Future Appointments Date Time Provider [place]  01/29/2017 8:15 AM [facility] 1 [facility] None  01/29/2017 8:30 AM Curcio, Erasmo Downer R, NP [facility] None  01/29/2017 9:30 AM [facility] F20 [facility] None  02/05/2017 10:00 AM [facility] LAB 2 [facility] None  02/12/2017 10:00 AM [facility] LAB 6 [facility] None  02/19/2017 8:00 AM [facility] LAB 4 [facility] None  02/19/2017 8:30 AM Curt Bears, MD [facility] None  02/19/2017 9:30 AM [facility] H28 [facility] None  02/26/2017 10:00 AM [facility] LAB 5 [facility] None    No orders of the defined types were placed in this encounter.      Subjective:   Patient ID:  Mark Howell is a 76 y.o. (DOB 1941-02-21) male.  Chief Complaint:  Chief Complaint  Patient presents with  . Mucositis    HPI Mark Howell Is a 76 year old male with a history of a metastatic non-small cell lung cancer, adenocarcinoma, initially diagnosed his unresectable stage IIA (T1a, N1, M0) non-small cell lung cancer, adenocarcinoma with a right hilar mass and right hilar adenopathy originally diagnosed in September 2017. 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 The patient had a PET scan completed on 12/23/2016 which showed persistent hypermetabolic right hilar lesion, substantial interstitial and air space disease in the right perihilar lung compatible with postradiation fibrosis and interval development of bilateral hypermetabolic adrenal lesions consistent with metastatic involvement. He was started on chemotherapy with Avastin, Alimta, and carboplatin which he received on 01/08/2017. He has stopped Embrel per recommendations of his rheumatologist. He developed oral tenderness with oral lesions and soreness of his lips around 1 week after his chemotherapy. He used Peridex (chlorhexidine) which he had at home with minimal relief of his oral tenderness. He denies fevers, chills, sweats, nausea, vomiting, constipation, diarrhea, or difficulty swallowing.  Medications: I have reviewed the patient's current medications.  Allergies:  Allergies  Allergen Reactions  . Hydrocodone Rash  . Oxycodone Rash    Past Medical History:  Diagnosis Date  . Arthritis   . Complication of anesthesia    problems voiding post op  . Full dentures   . GERD (gastroesophageal reflux disease)   . History of radiation therapy 01/25/16-02/28/16   right lung 45 Gy  . History of radiation therapy 04/17/16-05/02/16   right lung boost 20 Gy in 10 fractions cumulative dose 65 gray  . HOH (hard of hearing)   . HTN (hypertension)   . Hyperlipidemia   . Lung mass 12/30/2015  . Odynophagia 04/06/2016  . Psoriatic arthritis ([place])   . Snores   . Wears glasses     Past Surgical History:  Procedure Laterality Date  . COLONOSCOPY    . HEMORRHOID SURGERY N/A 10/01/2013   Procedure: EXAM UNDER ANESTHESIA  AND EXCISIONAL HEMORRHOIDECTOMY WITH HEMORRHOID BANDING X 2;  Surgeon: Gayland Curry, MD;  Location: [place];  Service: General;  Laterality: N/A;  . INGUINAL HERNIA REPAIR  01/04/2012   Procedure: LAPAROSCOPIC INGUINAL HERNIA;   Surgeon: Gayland Curry, MD,FACS;  Location: [facility];  Service: General;  Laterality: Right;  . IR FLUORO GUIDE PORT INSERTION RIGHT  01/02/2017  . IR US GUIDE VASC ACCESS RIGHT  01/02/2017  . VIDEO BRONCHOSCOPY WITH ENDOBRONCHIAL ULTRASOUND N/A 12/31/2015   Procedure: VIDEO BRONCHOSCOPY WITH ENDOBRONCHIAL ULTRASOUND transbronchial biopsy of node 10 R lymph node;  Surgeon: Grace Isaac, MD;  Location: [facility];  Service: Thoracic;  Laterality: N/A;    Family History  Problem Relation Age of Onset  . Stroke Father   . Diabetes Father   . Heart disease Father   . Cancer Brother        pancreatic    Social History   Social History  . Marital status: Married    Spouse name: N/A  . Number of children: N/A  . Years of education: N/A   Occupational History  . Not on file.   Social History Main Topics  . Smoking status: Former Smoker    Packs/day: 0.50    Years: 50.00    Types: Cigarettes    Quit date: 11/30/2015  . Smokeless tobacco: Never Used  . Alcohol use Yes     Comment: occassionally  . Drug use: No  . Sexual activity: Not Currently   Other Topics Concern  . Not on file   Social History Narrative  . No narrative on file    Past Medical History, Surgical history, Social history, and Family history were reviewed and updated as appropriate.   Please see review of systems for further details on the patient's review from today.   Review of Systems:  Review of Systems  Constitutional: Negative for appetite change, chills and diaphoresis.  HENT: Positive for mouth sores.   Respiratory: Negative for cough, choking, chest tightness and shortness of breath.   Cardiovascular: Negative for chest pain, palpitations and leg swelling.  Gastrointestinal: Negative for constipation, diarrhea, nausea and vomiting.    Objective:   Physical Exam:  BP 123/88 (BP Location: Right Arm, Patient Position: Sitting)   Pulse 93   Temp 97.8 F (36.6 C) (Oral)   Resp 17   Ht 6\' 1"  (1.854 m)    Wt 179 lb 3.2 oz (81.3 kg)   SpO2 99%   BMI 23.64 kg/m  ECOG: 0  Physical Exam  Constitutional: No distress.  HENT:  Head: Normocephalic and atraumatic.  Mouth/Throat:    Neck: Normal range of motion. Neck supple.  Cardiovascular: Normal rate, regular rhythm and normal heart sounds.  Exam reveals no gallop and no friction rub.   No murmur heard. Pulmonary/Chest: Effort normal and breath sounds normal. No respiratory distress. He has no wheezes. He has no rales.  Musculoskeletal: He exhibits no edema.  Lymphadenopathy:    He has no cervical adenopathy.  Neurological: He is alert. Coordination normal.  Skin: Skin is warm and dry. No rash noted. He is not diaphoretic. No erythema.  Psychiatric: He has a normal mood and affect. His behavior is normal. Judgment and thought content normal.    Lab Review:     Component Value Date/Time   NA 139 01/22/2017 0955   K 4.2 01/22/2017 0955   CL 103 01/02/2017 0731   CO2 24 01/22/2017 0955   GLUCOSE 154 (H)  01/22/2017 0955   BUN 18.0 01/22/2017 0955   CREATININE 1.5 (H) 01/22/2017 0955   CALCIUM 8.9 01/22/2017 0955   PROT 7.3 01/22/2017 0955   ALBUMIN 3.8 01/22/2017 0955   AST 31 01/22/2017 0955   ALT 26 01/22/2017 0955   ALKPHOS 76 01/22/2017 0955   BILITOT 0.48 01/22/2017 0955   GFRNONAA 49 (L) 01/02/2017 0731   GFRAA 57 (L) 01/02/2017 0731       Component Value Date/Time   WBC 5.1 01/22/2017 0955   WBC 10.7 (H) 01/02/2017 0731   RBC 4.52 01/22/2017 0955   RBC 4.46 01/02/2017 0731   HGB 14.1 01/22/2017 0955   HCT 41.4 01/22/2017 0955   PLT 83 (L) 01/22/2017 0955   MCV 91.6 01/22/2017 0955   MCH 31.2 01/22/2017 0955   MCH 31.2 01/02/2017 0731   MCHC 34.1 01/22/2017 0955   MCHC 34.5 01/02/2017 0731   RDW 13.5 01/22/2017 0955   LYMPHSABS 0.7 (L) 01/22/2017 0955   MONOABS 0.4 01/22/2017 0955   EOSABS 0.1 01/22/2017 0955   BASOSABS 0.0 01/22/2017 0955   -------------------------------  Imaging from last 24 hours  (if applicable):  Radiology interpretation: Ir US Guide Vasc Access Right  Result Date: 01/02/2017 CLINICAL DATA:  Lung cancer EXAM: TUNNEL POWER PORT PLACEMENT WITH SUBCUTANEOUS POCKET UTILIZING ULTRASOUND & FLOUROSCOPY FLUOROSCOPY TIME:  48 seconds.  Four mGy. MEDICATIONS AND MEDICAL HISTORY: Versed 100 mg, Fentanyl 1.5 mcg. Additional Medications: Ancef 2 g. Antibiotics were given within 2 hours of the procedure. ANESTHESIA/SEDATION: Moderate sedation time: 26 minutes. Nursing monitored the the patient during the procedure. PROCEDURE: After written informed consent was obtained, patient was placed in the supine position on angiographic table. The right neck and chest was prepped and draped in a sterile fashion. Lidocaine was utilized for local anesthesia. The right jugular vein was noted to be patent initially with ultrasound. Under sonographic guidance, a micropuncture needle was inserted into the right IJ vein (Ultrasound and fluoroscopic image documentation was performed). The needle was removed over an 018 wire which was exchanged for a Amplatz. This was advanced into the IVC. An 8-French dilator was advanced over the Amplatz. A small incision was made in the right upper chest over the anterior right second rib. Utilizing blunt dissection, a subcutaneous pocket was created in the caudal direction. The pocket was irrigated with a copious amount of sterile normal saline. The port catheter was tunneled from the chest incision, and out the neck incision. The reservoir was inserted into the subcutaneous pocket and secured with two 3-0 Ethilon stitches. A peel-away sheath was advanced over the Amplatz wire. The port catheter was cut to measure length and inserted through the peel-away sheath. The peel-away sheath was removed. The chest incision was closed with 3-0 Vicryl interrupted stitches for the subcutaneous tissue and a running of 4-0 Vicryl subcuticular stitch for the skin. The neck incision was closed  with a 4-0 Vicryl subcuticular stitch. Derma-bond was applied to both surgical incisions. The port reservoir was flushed and instilled with heparinized saline. No complications. FINDINGS: A right IJ vein Port-A-Cath is in place with its tip at the cavoatrial junction. COMPLICATIONS: None IMPRESSION: Successful 8 French right internal jugular vein power port placement with its tip at the SVC/RA junction. Electronically Signed   By: Marybelle Killings M.D.   On: 01/02/2017 11:01   Ir Fluoro Guide Port Insertion Right  Result Date: 01/02/2017 CLINICAL DATA:  Lung cancer EXAM: TUNNEL POWER PORT PLACEMENT WITH SUBCUTANEOUS POCKET UTILIZING ULTRASOUND &  FLOUROSCOPY FLUOROSCOPY TIME:  48 seconds.  Four mGy. MEDICATIONS AND MEDICAL HISTORY: Versed 100 mg, Fentanyl 1.5 mcg. Additional Medications: Ancef 2 g. Antibiotics were given within 2 hours of the procedure. ANESTHESIA/SEDATION: Moderate sedation time: 26 minutes. Nursing monitored the the patient during the procedure. PROCEDURE: After written informed consent was obtained, patient was placed in the supine position on angiographic table. The right neck and chest was prepped and draped in a sterile fashion. Lidocaine was utilized for local anesthesia. The right jugular vein was noted to be patent initially with ultrasound. Under sonographic guidance, a micropuncture needle was inserted into the right IJ vein (Ultrasound and fluoroscopic image documentation was performed). The needle was removed over an 018 wire which was exchanged for a Amplatz. This was advanced into the IVC. An 8-French dilator was advanced over the Amplatz. A small incision was made in the right upper chest over the anterior right second rib. Utilizing blunt dissection, a subcutaneous pocket was created in the caudal direction. The pocket was irrigated with a copious amount of sterile normal saline. The port catheter was tunneled from the chest incision, and out the neck incision. The reservoir was  inserted into the subcutaneous pocket and secured with two 3-0 Ethilon stitches. A peel-away sheath was advanced over the Amplatz wire. The port catheter was cut to measure length and inserted through the peel-away sheath. The peel-away sheath was removed. The chest incision was closed with 3-0 Vicryl interrupted stitches for the subcutaneous tissue and a running of 4-0 Vicryl subcuticular stitch for the skin. The neck incision was closed with a 4-0 Vicryl subcuticular stitch. Derma-bond was applied to both surgical incisions. The port reservoir was flushed and instilled with heparinized saline. No complications. FINDINGS: A right IJ vein Port-A-Cath is in place with its tip at the cavoatrial junction. COMPLICATIONS: None IMPRESSION: Successful 8 French right internal jugular vein power port placement with its tip at the SVC/RA junction. Electronically Signed   By: Marybelle Killings M.D.   On: 01/02/2017 11:01

## 2017-01-29 ENCOUNTER — Ambulatory Visit (HOSPITAL_BASED_OUTPATIENT_CLINIC_OR_DEPARTMENT_OTHER): Payer: Medicare Other | Admitting: Oncology

## 2017-01-29 ENCOUNTER — Encounter: Payer: Self-pay | Admitting: Oncology

## 2017-01-29 ENCOUNTER — Other Ambulatory Visit (HOSPITAL_BASED_OUTPATIENT_CLINIC_OR_DEPARTMENT_OTHER): Payer: Medicare Other

## 2017-01-29 ENCOUNTER — Ambulatory Visit (HOSPITAL_BASED_OUTPATIENT_CLINIC_OR_DEPARTMENT_OTHER): Payer: Medicare Other

## 2017-01-29 VITALS — BP 127/87 | HR 88 | Temp 97.6°F | Resp 18 | Ht 73.0 in | Wt 182.6 lb

## 2017-01-29 VITALS — BP 139/85 | HR 72

## 2017-01-29 DIAGNOSIS — Z5112 Encounter for antineoplastic immunotherapy: Secondary | ICD-10-CM

## 2017-01-29 DIAGNOSIS — Z5111 Encounter for antineoplastic chemotherapy: Secondary | ICD-10-CM

## 2017-01-29 DIAGNOSIS — C3401 Malignant neoplasm of right main bronchus: Secondary | ICD-10-CM

## 2017-01-29 DIAGNOSIS — C7971 Secondary malignant neoplasm of right adrenal gland: Secondary | ICD-10-CM | POA: Diagnosis not present

## 2017-01-29 DIAGNOSIS — C7972 Secondary malignant neoplasm of left adrenal gland: Secondary | ICD-10-CM

## 2017-01-29 DIAGNOSIS — C3491 Malignant neoplasm of unspecified part of right bronchus or lung: Secondary | ICD-10-CM

## 2017-01-29 LAB — CBC WITH DIFFERENTIAL/PLATELET
BASO%: 0.2 % (ref 0.0–2.0)
Basophils Absolute: 0 10*3/uL (ref 0.0–0.1)
EOS ABS: 0 10*3/uL (ref 0.0–0.5)
EOS%: 0 % (ref 0.0–7.0)
HEMATOCRIT: 41.5 % (ref 38.4–49.9)
HEMOGLOBIN: 14.4 g/dL (ref 13.0–17.1)
LYMPH#: 0.9 10*3/uL (ref 0.9–3.3)
LYMPH%: 13.2 % — ABNORMAL LOW (ref 14.0–49.0)
MCH: 31.7 pg (ref 27.2–33.4)
MCHC: 34.7 g/dL (ref 32.0–36.0)
MCV: 91.2 fL (ref 79.3–98.0)
MONO#: 0.6 10*3/uL (ref 0.1–0.9)
MONO%: 8.1 % (ref 0.0–14.0)
NEUT%: 78.5 % — ABNORMAL HIGH (ref 39.0–75.0)
NEUTROS ABS: 5.5 10*3/uL (ref 1.5–6.5)
PLATELETS: 131 10*3/uL — AB (ref 140–400)
RBC: 4.55 10*6/uL (ref 4.20–5.82)
RDW: 13.7 % (ref 11.0–14.6)
WBC: 6.9 10*3/uL (ref 4.0–10.3)

## 2017-01-29 LAB — COMPREHENSIVE METABOLIC PANEL
ALT: 20 U/L (ref 0–55)
ANION GAP: 8 meq/L (ref 3–11)
AST: 21 U/L (ref 5–34)
Albumin: 4 g/dL (ref 3.5–5.0)
Alkaline Phosphatase: 79 U/L (ref 40–150)
BUN: 18.7 mg/dL (ref 7.0–26.0)
CHLORIDE: 106 meq/L (ref 98–109)
CO2: 24 meq/L (ref 22–29)
CREATININE: 1.4 mg/dL — AB (ref 0.7–1.3)
Calcium: 9.2 mg/dL (ref 8.4–10.4)
EGFR: 50 mL/min/{1.73_m2} — AB (ref >90)
Glucose: 187 mg/dl — ABNORMAL HIGH (ref 70–140)
Potassium: 5 mEq/L (ref 3.5–5.1)
Sodium: 137 mEq/L (ref 136–145)
Total Bilirubin: 0.46 mg/dL (ref 0.20–1.20)
Total Protein: 7.8 g/dL (ref 6.4–8.3)

## 2017-01-29 LAB — UA PROTEIN, DIPSTICK - CHCC: Protein, ur: <30 mg/dL

## 2017-01-29 MED ORDER — PALONOSETRON HCL INJECTION 0.25 MG/5ML
0.2500 mg | Freq: Once | INTRAVENOUS | Status: AC
  Administered 2017-01-29: 0.25 mg via INTRAVENOUS

## 2017-01-29 MED ORDER — SODIUM CHLORIDE 0.9% FLUSH
10.0000 mL | INTRAVENOUS | Status: DC | PRN
  Administered 2017-01-29: 10 mL
  Filled 2017-01-29: qty 10

## 2017-01-29 MED ORDER — PALONOSETRON HCL INJECTION 0.25 MG/5ML
INTRAVENOUS | Status: AC
  Filled 2017-01-29: qty 5

## 2017-01-29 MED ORDER — HEPARIN SOD (PORK) LOCK FLUSH 100 UNIT/ML IV SOLN
500.0000 [IU] | Freq: Once | INTRAVENOUS | Status: AC | PRN
  Administered 2017-01-29: 500 [IU]
  Filled 2017-01-29: qty 5

## 2017-01-29 MED ORDER — SODIUM CHLORIDE 0.9 % IV SOLN
Freq: Once | INTRAVENOUS | Status: AC
  Administered 2017-01-29: 10:00:00 via INTRAVENOUS
  Filled 2017-01-29: qty 5

## 2017-01-29 MED ORDER — SODIUM CHLORIDE 0.9 % IV SOLN
365.0000 mg | Freq: Once | INTRAVENOUS | Status: AC
  Administered 2017-01-29: 370 mg via INTRAVENOUS
  Filled 2017-01-29: qty 37

## 2017-01-29 MED ORDER — SODIUM CHLORIDE 0.9 % IV SOLN
490.0000 mg/m2 | Freq: Once | INTRAVENOUS | Status: AC
  Administered 2017-01-29: 1000 mg via INTRAVENOUS
  Filled 2017-01-29: qty 40

## 2017-01-29 MED ORDER — SODIUM CHLORIDE 0.9 % IV SOLN
14.8000 mg/kg | Freq: Once | INTRAVENOUS | Status: AC
  Administered 2017-01-29: 1200 mg via INTRAVENOUS
  Filled 2017-01-29: qty 48

## 2017-01-29 MED ORDER — SODIUM CHLORIDE 0.9 % IV SOLN
Freq: Once | INTRAVENOUS | Status: AC
  Administered 2017-01-29: 10:00:00 via INTRAVENOUS

## 2017-01-29 NOTE — Patient Instructions (Signed)
[facility] Discharge Instructions for Patients Receiving Chemotherapy  Today you received the following chemotherapy agents Avastin, Carboplatin, Alimta  To help prevent nausea and vomiting after your treatment, we encourage you to take your nausea medication as directed   If you develop nausea and vomiting that is not controlled by your nausea medication, call the clinic.   BELOW ARE SYMPTOMS THAT SHOULD BE REPORTED IMMEDIATELY:  *FEVER GREATER THAN 100.5 F  *CHILLS WITH OR WITHOUT FEVER  NAUSEA AND VOMITING THAT IS NOT CONTROLLED WITH YOUR NAUSEA MEDICATION  *UNUSUAL SHORTNESS OF BREATH  *UNUSUAL BRUISING OR BLEEDING  TENDERNESS IN MOUTH AND THROAT WITH OR WITHOUT PRESENCE OF ULCERS  *URINARY PROBLEMS  *BOWEL PROBLEMS  UNUSUAL RASH Items with * indicate a potential emergency and should be followed up as soon as possible.  Feel free to call the clinic should you have any questions or concerns. The clinic phone number is [phone].  Please show the [facility] at check-in to the Emergency Department and triage nurse.

## 2017-01-29 NOTE — Progress Notes (Signed)
[facility] Cancer Follow up:    Mark Housekeeper, MD [place] [address]   DIAGNOSIS: Metastatic non-small cell lung cancer, adenocarcinoma initially diagnosed as Unresectable stage IIA (T1a, N1, M0) non-small cell lung cancer, adenocarcinoma presented with a right hilar mass and right hilar adenopathy diagnosed in September 2017. Guardant 360: Negative for EGFR, ALK, ROS 1 and BRAF mutations.  SUMMARY OF ONCOLOGIC HISTORY: Oncology History   Patient presented with abdominal pain and weight loss.  Work up showed incidental finding of lung mass.        Lung mass (Resolved)   12/03/2015 Imaging    CT Abd/Pelvis      12/03/2015 Imaging    CXR IMPRESSION: Interstitial changes in the left lung which are nonspecific and could be acute or chronic.      12/17/2015 Imaging    CT Chest IMPRESSION: Bilateral pulmonary nodules with evidence of right hilar adenopathy      12/27/2015 Imaging    PET IMPRESSION: Hypermetabolic right hilar mass worrisome for central bronchogenic carcinoma or metastatic disease to right hilar lymph nodes.       12/30/2015 Initial Diagnosis    Lung mass     12/31/2015 Surgery    Fiberoptic bronchoscopy under general anesthesia. Endoscopic bronchial ultrasound with transbronchial biopsies of 10R lymph nodes.      01/04/2016 Pathology Results    FINE NEEDLE ASPIRATION,ENDOSCOPIC (C) EBUS 10R # 3 (SPECIMEN 3 OF 3,COLLECTED ON 12/31/15) MALIGNANT CELLS CONSISTENT WITH NON-SMALL CELL CARCINOMA.       PRIOR THERAPY: Status post concurrent chemoradiation with weekly carboplatin for AUC of 2 and paclitaxel 45 MG/M2 for 7 weeks.  CURRENT THERAPY: Systemic chemotherapy with carboplatin for AUC of 5, Alimta 500 MG/M2 and Avastin 15 MG/KG every 3 weeks. First dose 01/08/2017. Status post 1 cycle.  INTERVAL HISTORY: Mark Howell 76 y.o. male returns for a routine follow-up visit accompanied by his wife. The patient is feeling fine  today without any significant complaints. The patient was seen in her symptom management clinic following his last cycle of chemotherapy for mucositis. He was given viscous lidocaine and magic mouthwash. His mucositis has resolved. The patient denies fevers and chills. Denies chest pain, shortness of breath, cough, hemoptysis. Denies nausea, vomiting, diarrhea, constipation. The patient is here for evaluation prior to cycle 2 of his chemotherapy.   Patient Active Problem List   Diagnosis Date Noted  . Adenocarcinoma of right lung, stage 4 ([place]) 12/27/2016  . Goals of care, counseling/discussion 12/27/2016  . Odynophagia 04/06/2016  . Antineoplastic chemotherapy induced anemia 04/06/2016  . 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 Adenocarcinoma of right lung, stage 2 ([place]) 01/13/2016  . Encounter for antineoplastic chemotherapy 01/13/2016  . Snoring 03/19/2012    is allergic to hydrocodone and oxycodone.  MEDICAL HISTORY: Past Medical History:  Diagnosis Date  . Arthritis   . Complication of anesthesia    problems voiding post op  . Full dentures   . GERD (gastroesophageal reflux disease)   . History of radiation therapy 01/25/16-02/28/16   right lung 45 Gy  . History of radiation therapy 04/17/16-05/02/16   right lung boost 20 Gy in 10 fractions cumulative dose 65 gray  . HOH (hard of hearing)   . HTN (hypertension)   . Hyperlipidemia   . Lung mass 12/30/2015  . Odynophagia 04/06/2016  . Psoriatic arthritis ([place])   . Snores   . Wears glasses     SURGICAL HISTORY: Past Surgical History:  Procedure Laterality Date  . COLONOSCOPY    . HEMORRHOID SURGERY N/A 10/01/2013   Procedure: EXAM UNDER ANESTHESIA  AND EXCISIONAL HEMORRHOIDECTOMY WITH HEMORRHOID BANDING X 2;  Surgeon: Gayland Curry, MD;  Location: [facility];  Service: General;  Laterality: N/A;  . INGUINAL HERNIA REPAIR  01/04/2012   Procedure: LAPAROSCOPIC INGUINAL HERNIA;  Surgeon: Gayland Curry, MD,FACS;   Location[facility];  Service: General;  Laterality: Right;  . IR FLUORO GUIDE PORT INSERTION RIGHT  01/02/2017  . IR US GUIDE VASC ACCESS RIGHT  01/02/2017  . VIDEO BRONCHOSCOPY WITH ENDOBRONCHIAL ULTRASOUND N/A 12/31/2015   Procedure: VIDEO BRONCHOSCOPY WITH ENDOBRONCHIAL ULTRASOUND transbronchial biopsy of node 10 R lymph node;  Surgeon: Grace Isaac, MD;  Location: [facility];  Service: Thoracic;  Laterality: N/A;    SOCIAL HISTORY: Social History   Social History  . Marital status: Married    Spouse name: N/A  . Number of children: N/A  . Years of education: N/A   Occupational History  . Not on file.   Social History Main Topics  . Smoking status: Former Smoker    Packs/day: 0.50    Years: 50.00    Types: Cigarettes    Quit date: 11/30/2015  . Smokeless tobacco: Never Used  . Alcohol use Yes     Comment: occassionally  . Drug use: No  . Sexual activity: Not Currently   Other Topics Concern  . Not on file   Social History Narrative  . No narrative on file    FAMILY HISTORY: Family History  Problem Relation Age of Onset  . Stroke Father   . Diabetes Father   . Heart disease Father   . Cancer Brother        pancreatic    Review of Systems  Constitutional: Negative.   HENT:  Negative.   Eyes: Negative.   Respiratory: Negative.   Cardiovascular: Negative.   Gastrointestinal: Negative.   Genitourinary: Negative.    Musculoskeletal: Negative.   Skin: Negative.   Neurological: Negative.   Hematological: Negative.   Psychiatric/Behavioral: Negative.       PHYSICAL EXAMINATION  ECOG PERFORMANCE STATUS: 1 - Symptomatic but completely ambulatory  Vitals:   01/29/17 0903  BP: 127/87  Pulse: 88  Resp: 18  Temp: 97.6 F (36.4 C)  SpO2: 100%    Physical Exam  Constitutional: He is oriented to person, place, and time and well-developed, well-nourished, and in no distress. No distress.  HENT:  Head: Normocephalic.  Mouth/Throat: Oropharynx is clear and moist.  No oropharyngeal exudate.  Eyes: Conjunctivae are normal. Right eye exhibits no discharge. Left eye exhibits no discharge. No scleral icterus.  Neck: Normal range of motion. Neck supple.  Cardiovascular: Normal rate, regular rhythm, normal heart sounds and intact distal pulses.   Pulmonary/Chest: Effort normal and breath sounds normal. No respiratory distress. He has no wheezes. He has no rales.  Abdominal: Soft. Bowel sounds are normal. He exhibits no distension and no mass. There is no tenderness.  Musculoskeletal: Normal range of motion. He exhibits no edema.  Lymphadenopathy:    He has no cervical adenopathy.  Neurological: He is alert and oriented to person, place, and time. He exhibits normal muscle tone. Gait normal. Coordination normal.  Skin: Skin is warm and dry. No rash noted. He is not diaphoretic. No erythema. No pallor.  Psychiatric: Mood, memory, affect and judgment normal.  Vitals reviewed.   LABORATORY DATA:  CBC    Component Value Date/Time  WBC 6.9 01/29/2017 0847   WBC 10.7 (H) 01/02/2017 0731   RBC 4.55 01/29/2017 0847   RBC 4.46 01/02/2017 0731   HGB 14.4 01/29/2017 0847   HCT 41.5 01/29/2017 0847   PLT 131 (L) 01/29/2017 0847   MCV 91.2 01/29/2017 0847   MCH 31.7 01/29/2017 0847   MCH 31.2 01/02/2017 0731   MCHC 34.7 01/29/2017 0847   MCHC 34.5 01/02/2017 0731   RDW 13.7 01/29/2017 0847   LYMPHSABS 0.9 01/29/2017 0847   MONOABS 0.6 01/29/2017 0847   EOSABS 0.0 01/29/2017 0847   BASOSABS 0.0 01/29/2017 0847    CMP     Component Value Date/Time   NA 137 01/29/2017 0847   K 5.0 01/29/2017 0847   CL 103 01/02/2017 0731   CO2 24 01/29/2017 0847   GLUCOSE 187 (H) 01/29/2017 0847   BUN 18.7 01/29/2017 0847   CREATININE 1.4 (H) 01/29/2017 0847   CALCIUM 9.2 01/29/2017 0847   PROT 7.8 01/29/2017 0847   ALBUMIN 4.0 01/29/2017 0847   AST 21 01/29/2017 0847   ALT 20 01/29/2017 0847   ALKPHOS 79 01/29/2017 0847   BILITOT 0.46 01/29/2017 0847    GFRNONAA 49 (L) 01/02/2017 0731   GFRAA 57 (L) 01/02/2017 0731    RADIOGRAPHIC STUDIES:  No results found.  ASSESSMENT and THERAPY PLAN:   Adenocarcinoma of right lung, stage 4 ([place]) This is a very pleasant 76 year old white male with unresectable a stage II a non-small cell lung cancer, adenocarcinoma presented with right hilar mass status post a course of concurrent chemoradiation with weekly carboplatin and paclitaxel. He has partial response to this treatment. The patient was placed on observation for several months. 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 PET scan that confirmed these findings with no other concerning findings for disease progression. His Guardant 360 molecular study showed no actionable mutations.  The patient is currently on palliative systemic chemotherapy was carboplatin for AUC of 5, Alimta 500 MG/M2 and Avastin 15 MG/KG every 3 weeks. The patient is status post 1 cycle. He developed mucositis following the first cycle has now resolved. He has viscous lidocaine and magic mouthwash at home if he needs it. Recommend that he proceed with cycle 2 of his chemotherapy as scheduled today.  The patient will continue to have weekly labs. Return visit will be in 3 weeks for evaluation prior to cycle 3 of chemotherapy.  He was advised to call immediately if he has any concerning symptoms in the interval. The patient voices understanding of current disease status and treatment options and is in agreement with the current care plan. All questions were answered. The patient knows to call the clinic with any problems, questions or concerns. We can certainly see the patient much sooner if necessary.   No orders of the defined types were placed in this encounter.   All questions were answered. The patient knows to call the clinic with any problems, questions or  concerns. We can certainly see the patient much sooner if necessary.  Mikey Bussing, NP 01/29/2017

## 2017-01-29 NOTE — Assessment & Plan Note (Signed)
This is a very pleasant 76 year old white male with unresectable a stage II a non-small cell lung cancer, adenocarcinoma presented with right hilar mass status post a course of concurrent chemoradiation with weekly carboplatin and paclitaxel. He has partial response to this treatment. The patient was placed on observation for several months. 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 PET scan that confirmed these findings with no other concerning findings for disease progression. His Guardant 360 molecular study showed no actionable mutations.  The patient is currently on palliative systemic chemotherapy was carboplatin for AUC of 5, Alimta 500 MG/M2 and Avastin 15 MG/KG every 3 weeks. The patient is status post 1 cycle. He developed mucositis following the first cycle has now resolved. He has viscous lidocaine and magic mouthwash at home if he needs it. Recommend that he proceed with cycle 2 of his chemotherapy as scheduled today.  The patient will continue to have weekly labs. Return visit will be in 3 weeks for evaluation prior to cycle 3 of chemotherapy.  He was advised to call immediately if he has any concerning symptoms in the interval. The patient voices understanding of current disease status and treatment options and is in agreement with the current care plan. All questions were answered. The patient knows to call the clinic with any problems, questions or concerns. We can certainly see the patient much sooner if necessary.

## 2017-01-29 NOTE — Addendum Note (Signed)
Addended by: Maryanna Shape on: 01/29/2017 01:42 PM   Modules accepted: Orders

## 2017-01-30 ENCOUNTER — Telehealth: Payer: Self-pay | Admitting: Internal Medicine

## 2017-01-30 NOTE — Telephone Encounter (Signed)
Mailed patient calendar of upcoming appointments.

## 2017-02-02 ENCOUNTER — Telehealth: Payer: Self-pay

## 2017-02-02 MED ORDER — NYSTATIN 100000 UNIT/GM EX POWD: Freq: Three times a day (TID) | CUTANEOUS | 0 refills | Status: DC

## 2017-02-02 NOTE — Telephone Encounter (Signed)
Wife called that pt has a rash between his legs near his groin.  Rash is on both legs, bright red and little pimples, it burns a lot and itches a little. Raw feeling. No drainage.  Started yesterday.  1st chemo 4 weeks ago had some and used an old rx in the house that helped. Clotrimazole and betamethasone dipropinate cream. And nystatin powder.   Avastin, alimta carbo on 10/1, next visit 10/22.  His air conditioner has gone out and he is hot and doesn't feel too good.   S/w Dr Julien Nordmann and he said to make sure pt is using his dex. OK to refill what he used before. S/w wife and he did take his dex. Pt said the powder worked better than the cream. Refilled nystatin powder.

## 2017-02-05 ENCOUNTER — Other Ambulatory Visit (HOSPITAL_BASED_OUTPATIENT_CLINIC_OR_DEPARTMENT_OTHER): Payer: Medicare Other

## 2017-02-05 DIAGNOSIS — C3401 Malignant neoplasm of right main bronchus: Secondary | ICD-10-CM

## 2017-02-05 DIAGNOSIS — C3491 Malignant neoplasm of unspecified part of right bronchus or lung: Secondary | ICD-10-CM

## 2017-02-05 DIAGNOSIS — Z5111 Encounter for antineoplastic chemotherapy: Secondary | ICD-10-CM

## 2017-02-05 LAB — CBC WITH DIFFERENTIAL/PLATELET
BASO%: 0.4 % (ref 0.0–2.0)
BASOS ABS: 0 10*3/uL (ref 0.0–0.1)
EOS ABS: 0.1 10*3/uL (ref 0.0–0.5)
EOS%: 3.4 % (ref 0.0–7.0)
HCT: 41 % (ref 38.4–49.9)
HEMOGLOBIN: 14.1 g/dL (ref 13.0–17.1)
LYMPH%: 17.5 % (ref 14.0–49.0)
MCH: 31.3 pg (ref 27.2–33.4)
MCHC: 34.4 g/dL (ref 32.0–36.0)
MCV: 90.9 fL (ref 79.3–98.0)
MONO#: 0.3 10*3/uL (ref 0.1–0.9)
MONO%: 9.2 % (ref 0.0–14.0)
NEUT%: 69.5 % (ref 39.0–75.0)
NEUTROS ABS: 2.4 10*3/uL (ref 1.5–6.5)
PLATELETS: 74 10*3/uL — AB (ref 140–400)
RBC: 4.51 10*6/uL (ref 4.20–5.82)
RDW: 14.3 % (ref 11.0–14.6)
WBC: 3.5 10*3/uL — ABNORMAL LOW (ref 4.0–10.3)
lymph#: 0.6 10*3/uL — ABNORMAL LOW (ref 0.9–3.3)

## 2017-02-05 LAB — COMPREHENSIVE METABOLIC PANEL
ALT: 34 U/L (ref 0–55)
AST: 37 U/L — AB (ref 5–34)
Albumin: 3.6 g/dL (ref 3.5–5.0)
Alkaline Phosphatase: 81 U/L (ref 40–150)
Anion Gap: 9 mEq/L (ref 3–11)
BUN: 18.7 mg/dL (ref 7.0–26.0)
CALCIUM: 9.2 mg/dL (ref 8.4–10.4)
CHLORIDE: 106 meq/L (ref 98–109)
CO2: 22 meq/L (ref 22–29)
CREATININE: 1.4 mg/dL — AB (ref 0.7–1.3)
EGFR: 47 mL/min/{1.73_m2} — ABNORMAL LOW (ref >90)
Glucose: 192 mg/dl — ABNORMAL HIGH (ref 70–140)
POTASSIUM: 5.5 meq/L — AB (ref 3.5–5.1)
SODIUM: 137 meq/L (ref 136–145)
Total Bilirubin: 0.47 mg/dL (ref 0.20–1.20)
Total Protein: 7.6 g/dL (ref 6.4–8.3)

## 2017-02-12 ENCOUNTER — Other Ambulatory Visit (HOSPITAL_BASED_OUTPATIENT_CLINIC_OR_DEPARTMENT_OTHER): Payer: Medicare Other

## 2017-02-12 DIAGNOSIS — C3401 Malignant neoplasm of right main bronchus: Secondary | ICD-10-CM

## 2017-02-12 DIAGNOSIS — C3491 Malignant neoplasm of unspecified part of right bronchus or lung: Secondary | ICD-10-CM

## 2017-02-12 DIAGNOSIS — Z5111 Encounter for antineoplastic chemotherapy: Secondary | ICD-10-CM

## 2017-02-12 LAB — COMPREHENSIVE METABOLIC PANEL
ALBUMIN: 3.6 g/dL (ref 3.5–5.0)
ALK PHOS: 83 U/L (ref 40–150)
ALT: 25 U/L (ref 0–55)
AST: 34 U/L (ref 5–34)
Anion Gap: 11 mEq/L (ref 3–11)
BILIRUBIN TOTAL: 0.46 mg/dL (ref 0.20–1.20)
BUN: 18.2 mg/dL (ref 7.0–26.0)
CO2: 21 meq/L — AB (ref 22–29)
Calcium: 8.2 mg/dL — ABNORMAL LOW (ref 8.4–10.4)
Chloride: 103 mEq/L (ref 98–109)
Creatinine: 1.6 mg/dL — ABNORMAL HIGH (ref 0.7–1.3)
EGFR: 41 mL/min/{1.73_m2} — ABNORMAL LOW (ref >60)
GLUCOSE: 167 mg/dL — AB (ref 70–140)
POTASSIUM: 4.7 meq/L (ref 3.5–5.1)
SODIUM: 135 meq/L — AB (ref 136–145)
TOTAL PROTEIN: 7.6 g/dL (ref 6.4–8.3)

## 2017-02-12 LAB — CBC WITH DIFFERENTIAL/PLATELET
BASO%: 0 % (ref 0.0–2.0)
BASOS ABS: 0 10*3/uL (ref 0.0–0.1)
EOS%: 0.4 % (ref 0.0–7.0)
Eosinophils Absolute: 0 10*3/uL (ref 0.0–0.5)
HEMATOCRIT: 38.2 % — AB (ref 38.4–49.9)
HGB: 13.3 g/dL (ref 13.0–17.1)
LYMPH#: 0.7 10*3/uL — AB (ref 0.9–3.3)
LYMPH%: 15.4 % (ref 14.0–49.0)
MCH: 31.1 pg (ref 27.2–33.4)
MCHC: 34.8 g/dL (ref 32.0–36.0)
MCV: 89.5 fL (ref 79.3–98.0)
MONO#: 0.7 10*3/uL (ref 0.1–0.9)
MONO%: 15.2 % — ABNORMAL HIGH (ref 0.0–14.0)
NEUT#: 3.3 10*3/uL (ref 1.5–6.5)
NEUT%: 69 % (ref 39.0–75.0)
PLATELETS: 73 10*3/uL — AB (ref 140–400)
RBC: 4.27 10*6/uL (ref 4.20–5.82)
RDW: 14.6 % (ref 11.0–14.6)
WBC: 4.8 10*3/uL (ref 4.0–10.3)

## 2017-02-19 ENCOUNTER — Other Ambulatory Visit: Payer: Medicare Other

## 2017-02-19 ENCOUNTER — Encounter: Payer: Self-pay | Admitting: Internal Medicine

## 2017-02-19 ENCOUNTER — Other Ambulatory Visit (HOSPITAL_BASED_OUTPATIENT_CLINIC_OR_DEPARTMENT_OTHER): Payer: Medicare Other

## 2017-02-19 ENCOUNTER — Telehealth: Payer: Self-pay | Admitting: Internal Medicine

## 2017-02-19 ENCOUNTER — Ambulatory Visit: Payer: Medicare Other

## 2017-02-19 ENCOUNTER — Ambulatory Visit (HOSPITAL_BASED_OUTPATIENT_CLINIC_OR_DEPARTMENT_OTHER): Payer: Medicare Other

## 2017-02-19 ENCOUNTER — Ambulatory Visit (HOSPITAL_BASED_OUTPATIENT_CLINIC_OR_DEPARTMENT_OTHER): Payer: Medicare Other | Admitting: Internal Medicine

## 2017-02-19 VITALS — BP 133/79 | HR 92 | Temp 97.5°F | Resp 20 | Ht 73.0 in | Wt 181.6 lb

## 2017-02-19 VITALS — BP 143/82

## 2017-02-19 DIAGNOSIS — C3490 Malignant neoplasm of unspecified part of unspecified bronchus or lung: Secondary | ICD-10-CM

## 2017-02-19 DIAGNOSIS — Z5111 Encounter for antineoplastic chemotherapy: Secondary | ICD-10-CM

## 2017-02-19 DIAGNOSIS — C3401 Malignant neoplasm of right main bronchus: Secondary | ICD-10-CM

## 2017-02-19 DIAGNOSIS — C3491 Malignant neoplasm of unspecified part of right bronchus or lung: Secondary | ICD-10-CM

## 2017-02-19 DIAGNOSIS — T451X5A Adverse effect of antineoplastic and immunosuppressive drugs, initial encounter: Secondary | ICD-10-CM

## 2017-02-19 DIAGNOSIS — R530 Neoplastic (malignant) related fatigue: Secondary | ICD-10-CM | POA: Diagnosis not present

## 2017-02-19 DIAGNOSIS — D6481 Anemia due to antineoplastic chemotherapy: Secondary | ICD-10-CM

## 2017-02-19 DIAGNOSIS — Z5112 Encounter for antineoplastic immunotherapy: Secondary | ICD-10-CM

## 2017-02-19 DIAGNOSIS — Z95828 Presence of other vascular implants and grafts: Secondary | ICD-10-CM

## 2017-02-19 LAB — CBC WITH DIFFERENTIAL/PLATELET
BASO%: 0.7 % (ref 0.0–2.0)
Basophils Absolute: 0.1 10*3/uL (ref 0.0–0.1)
EOS%: 0 % (ref 0.0–7.0)
Eosinophils Absolute: 0 10*3/uL (ref 0.0–0.5)
HCT: 36.4 % — ABNORMAL LOW (ref 38.4–49.9)
HGB: 12.8 g/dL — ABNORMAL LOW (ref 13.0–17.1)
LYMPH#: 0.8 10*3/uL — AB (ref 0.9–3.3)
LYMPH%: 10.3 % — AB (ref 14.0–49.0)
MCH: 31.7 pg (ref 27.2–33.4)
MCHC: 35.1 g/dL (ref 32.0–36.0)
MCV: 90.5 fL (ref 79.3–98.0)
MONO#: 0.4 10*3/uL (ref 0.1–0.9)
MONO%: 5.6 % (ref 0.0–14.0)
NEUT#: 6.2 10*3/uL (ref 1.5–6.5)
NEUT%: 83.4 % — AB (ref 39.0–75.0)
Platelets: 171 10*3/uL (ref 140–400)
RBC: 4.02 10*6/uL — AB (ref 4.20–5.82)
RDW: 15.5 % — ABNORMAL HIGH (ref 11.0–14.6)
WBC: 7.5 10*3/uL (ref 4.0–10.3)

## 2017-02-19 LAB — COMPREHENSIVE METABOLIC PANEL
ALBUMIN: 3.8 g/dL (ref 3.5–5.0)
ALK PHOS: 81 U/L (ref 40–150)
ALT: 21 U/L (ref 0–55)
AST: 24 U/L (ref 5–34)
Anion Gap: 13 mEq/L — ABNORMAL HIGH (ref 3–11)
BILIRUBIN TOTAL: 0.42 mg/dL (ref 0.20–1.20)
BUN: 18.2 mg/dL (ref 7.0–26.0)
CALCIUM: 8.7 mg/dL (ref 8.4–10.4)
CO2: 18 mEq/L — ABNORMAL LOW (ref 22–29)
Chloride: 105 mEq/L (ref 98–109)
Creatinine: 1.3 mg/dL (ref 0.7–1.3)
EGFR: 53 mL/min/{1.73_m2} — AB (ref >60)
GLUCOSE: 200 mg/dL — AB (ref 70–140)
Potassium: 4.8 mEq/L (ref 3.5–5.1)
SODIUM: 135 meq/L — AB (ref 136–145)
TOTAL PROTEIN: 8 g/dL (ref 6.4–8.3)

## 2017-02-19 MED ORDER — HEPARIN SOD (PORK) LOCK FLUSH 100 UNIT/ML IV SOLN
500.0000 [IU] | Freq: Once | INTRAVENOUS | Status: AC | PRN
  Administered 2017-02-19: 500 [IU]
  Filled 2017-02-19: qty 5

## 2017-02-19 MED ORDER — SODIUM CHLORIDE 0.9% FLUSH
10.0000 mL | INTRAVENOUS | Status: DC | PRN
  Administered 2017-02-19: 10 mL
  Filled 2017-02-19: qty 10

## 2017-02-19 MED ORDER — SODIUM CHLORIDE 0.9 % IV SOLN
370.0000 mg | Freq: Once | INTRAVENOUS | Status: AC
  Administered 2017-02-19: 370 mg via INTRAVENOUS
  Filled 2017-02-19: qty 37

## 2017-02-19 MED ORDER — PALONOSETRON HCL INJECTION 0.25 MG/5ML
0.2500 mg | Freq: Once | INTRAVENOUS | Status: AC
  Administered 2017-02-19: 0.25 mg via INTRAVENOUS

## 2017-02-19 MED ORDER — FOSAPREPITANT DIMEGLUMINE INJECTION 150 MG
Freq: Once | INTRAVENOUS | Status: AC
  Administered 2017-02-19: 09:00:00 via INTRAVENOUS
  Filled 2017-02-19: qty 5

## 2017-02-19 MED ORDER — SODIUM CHLORIDE 0.9 % IV SOLN
14.8000 mg/kg | Freq: Once | INTRAVENOUS | Status: AC
  Administered 2017-02-19: 1200 mg via INTRAVENOUS
  Filled 2017-02-19: qty 48

## 2017-02-19 MED ORDER — SODIUM CHLORIDE 0.9 % IV SOLN
490.0000 mg/m2 | Freq: Once | INTRAVENOUS | Status: AC
  Administered 2017-02-19: 1000 mg via INTRAVENOUS
  Filled 2017-02-19: qty 40

## 2017-02-19 MED ORDER — SODIUM CHLORIDE 0.9% FLUSH
10.0000 mL | Freq: Once | INTRAVENOUS | Status: AC
  Administered 2017-02-19: 10 mL
  Filled 2017-02-19: qty 10

## 2017-02-19 MED ORDER — PALONOSETRON HCL INJECTION 0.25 MG/5ML
INTRAVENOUS | Status: AC
  Filled 2017-02-19: qty 5

## 2017-02-19 MED ORDER — SODIUM CHLORIDE 0.9 % IV SOLN
Freq: Once | INTRAVENOUS | Status: AC
  Administered 2017-02-19: 09:00:00 via INTRAVENOUS

## 2017-02-19 NOTE — Progress Notes (Signed)
[facility] [phone]   [phone]  OFFICE PROGRESS NOTE  [name], MD [place] [address]  DIAGNOSIS: Metastatic non-small cell lung cancer, adenocarcinoma initially diagnosed as Unresectable stage IIA (T1a, N1, M0) non-small cell lung cancer, adenocarcinoma presented with a right hilar mass and right hilar adenopathy diagnosed in September 2017. Guardant 360: Negative for EGFR, ALK, ROS 1 and BRAF mutations.  PRIOR THERAPY: Status post concurrent chemoradiation with weekly carboplatin for AUC of 2 and paclitaxel 45 MG/M2 for 7 weeks.  CURRENT THERAPY: Systemic chemotherapy with carboplatin for AUC of 5, Alimta 500 MG/M2 and Avastin 15 MG/KG every 3 weeks. First dose 01/08/2017. Status post 2 cycles.  INTERVAL HISTORY: Mark Howell 76 y.o. male returns to the clinic today for follow-up visit accompanied by his wife. The patient is feeling fine today but he has to weeks of fatigue and weakness after the last cycle of chemotherapy. He also has occasional nosebleed, which is currently resolved. He denied having any nausea, vomiting, diarrhea or constipation. He denied having any fever or chills. He has no significant chest pain, shortness of breath, cough or hemoptysis. He is here today for evaluation before starting cycle #3 of his treatment.   MEDICAL HISTORY: Past Medical History:  Diagnosis Date  . Arthritis   . Complication of anesthesia    problems voiding post op  . Full dentures   . GERD (gastroesophageal reflux disease)   . History of radiation therapy 01/25/16-02/28/16   right lung 45 Gy  . History of radiation therapy 04/17/16-05/02/16   right lung boost 20 Gy in 10 fractions cumulative dose 65 gray  . HOH (hard of hearing)   . HTN (hypertension)   . Hyperlipidemia   . Lung mass 12/30/2015  . Odynophagia 04/06/2016  . Psoriatic arthritis ([place])   . Snores   . Wears glasses     ALLERGIES:  is allergic  to hydrocodone and oxycodone.  MEDICATIONS:  Current Outpatient Prescriptions  Medication Sig Dispense Refill  . acetaminophen (TYLENOL) 500 MG tablet Take 500-1,000 mg by mouth 2 (two) times daily as needed for mild pain.    Marland Kitchen aspirin EC 81 MG tablet Take 81 mg by mouth at bedtime.     Marland Kitchen aspirin-sod bicarb-citric acid (ALKA-SELTZER) 325 MG TBEF tablet Take 650 mg by mouth daily as needed (indigestion).     Marland Kitchen dexamethasone (DECADRON) 4 MG tablet 4 mg by mouth twice a day the day before, day of and day after chemotherapy every 3 weeks. 40 tablet 1  . diphenhydramine-acetaminophen (TYLENOL PM) 25-500 MG TABS Take 1 tablet by mouth at bedtime. For sleep    . folic acid (FOLVITE) 1 MG tablet Take 1 tablet (1 mg total) by mouth daily. 30 tablet 4  . Homeopathic Products (LEG CRAMP COMPLEX PO) Take 2 tablets by mouth at bedtime as needed (leg cramp).     . lidocaine (XYLOCAINE) 2 % solution Use as directed 20 mLs in the mouth or throat every 3 (three) hours as needed for mouth pain. 100 mL 2  . lidocaine-prilocaine (EMLA) cream Apply 1 application topically as needed. (Patient taking differently: Apply 1 application topically as needed (port access). ) 30 g 0  . Lutein 10 MG TABS Take 10 mg by mouth at bedtime.     . magic mouthwash SOLN Take 5 mLs by mouth 4 (four) times daily as needed for mouth pain. 240 mL 2  . Methylcobalamin (METHYL  B-12 PO) Take 1 tablet by mouth daily.    . Multiple Vitamin (MULITIVITAMIN WITH MINERALS) TABS Take 1 tablet by mouth daily.    Marland Kitchen nystatin (MYCOSTATIN/NYSTOP) powder Apply topically 3 (three) times daily. Apply to groin rash. 15 g 0  . pantoprazole (PROTONIX) 40 MG tablet Take 40 mg by mouth daily.     . pravastatin (PRAVACHOL) 40 MG tablet Take 40 mg by mouth at bedtime.     . predniSONE (DELTASONE) 10 MG tablet Take 5 mg by mouth daily.     . prochlorperazine (COMPAZINE) 10 MG tablet Take 1 tablet (10 mg total) by mouth every 6 (six) hours as needed for nausea or  vomiting. 30 tablet 0  . tamsulosin (FLOMAX) 0.4 MG CAPS capsule TAKE (1) CAPSULE DAILY, START 4 DAYS BEFORE PROCEDURE (Patient taking differently: take 1 capsule by mouth every morning) 30 capsule 2  . triamcinolone (NASACORT) 55 MCG/ACT AERO nasal inhaler Place 1 spray into the nose at bedtime.      No current facility-administered medications for this visit.     SURGICAL HISTORY:  Past Surgical History:  Procedure Laterality Date  . COLONOSCOPY    . HEMORRHOID SURGERY N/A 10/01/2013   Procedure: EXAM UNDER ANESTHESIA  AND EXCISIONAL HEMORRHOIDECTOMY WITH HEMORRHOID BANDING X 2;  Surgeon: Gayland Curry, MD;  Location: [facility];  Service: General;  Laterality: N/A;  . INGUINAL HERNIA REPAIR  01/04/2012   Procedure: LAPAROSCOPIC INGUINAL HERNIA;  Surgeon: Gayland Curry, MD,FACS;  Location: [facility];  Service: General;  Laterality: Right;  . IR FLUORO GUIDE PORT INSERTION RIGHT  01/02/2017  . IR US GUIDE VASC ACCESS RIGHT  01/02/2017  . VIDEO BRONCHOSCOPY WITH ENDOBRONCHIAL ULTRASOUND N/A 12/31/2015   Procedure: VIDEO BRONCHOSCOPY WITH ENDOBRONCHIAL ULTRASOUND transbronchial biopsy of node 10 R lymph node;  Surgeon: Grace Isaac, MD;  Location: [facility];  Service: Thoracic;  Laterality: N/A;    REVIEW OF SYSTEMS:  A comprehensive review of systems was negative except for: Constitutional: positive for fatigue Ears, nose, mouth, throat, and face: positive for epistaxis   PHYSICAL EXAMINATION: General appearance: alert, cooperative, fatigued and no distress Head: Normocephalic, without obvious abnormality, atraumatic Neck: no adenopathy, no JVD, supple, symmetrical, trachea midline and thyroid not enlarged, symmetric, no tenderness/mass/nodules Lymph nodes: Cervical, supraclavicular, and axillary nodes normal. Resp: clear to auscultation bilaterally Back: symmetric, no curvature. ROM normal. No CVA tenderness. Cardio: regular rate and rhythm, S1, S2 normal, no murmur, click, rub or  gallop GI: soft, non-tender; bowel sounds normal; no masses,  no organomegaly Extremities: extremities normal, atraumatic, no cyanosis or edema  ECOG PERFORMANCE STATUS: 1 - Symptomatic but completely ambulatory  Blood pressure 133/79, pulse 92, temperature (!) 97.5 F (36.4 C), temperature source Oral, resp. rate 20, height 6' 1" (1.854 m), weight 181 lb 9.6 oz (82.4 kg), SpO2 98 %.  LABORATORY DATA: Lab Results  Component Value Date   WBC 7.5 02/19/2017   HGB 12.8 (L) 02/19/2017   HCT 36.4 (L) 02/19/2017   MCV 90.5 02/19/2017   PLT 171 02/19/2017      Chemistry      Component Value Date/Time   NA 135 (L) 02/12/2017 1025   K 4.7 02/12/2017 1025   CL 103 01/02/2017 0731   CO2 21 (L) 02/12/2017 1025   BUN 18.2 02/12/2017 1025   CREATININE 1.6 (H) 02/12/2017 1025      Component Value Date/Time   CALCIUM 8.2 (L) 02/12/2017 1025   ALKPHOS 83 02/12/2017  1025   AST 34 02/12/2017 1025   ALT 25 02/12/2017 1025   BILITOT 0.46 02/12/2017 1025       RADIOGRAPHIC STUDIES: No results found.  ASSESSMENT AND PLAN:  This is a very pleasant 76 years old white male with unresectable a stage II a non-small cell lung cancer, adenocarcinoma presented with right hilar mass status post a course of concurrent chemoradiation with weekly carboplatin and paclitaxel. He has partial response to this treatment. The patient has been observation for the last few months. The patient developed disease progression and he was started on systemic chemotherapy with carboplatin, Alimta and Avastin status post 2 cycles. He continues to tolerate his treatment well except for prolonged fatigue. I recommended for the patient to proceed with cycle #3 today as a scheduled. I will see him back for follow-up visit in 3 weeks for evaluation after repeating CT scan of the chest, abdomen and pelvis for restaging of his disease. The patient was advised to call immediately if he has any concerning symptoms in the  interval. The patient voices understanding of current disease status and treatment options and is in agreement with the current care plan. All questions were answered. The patient knows to call the clinic with any problems, questions or concerns. We can certainly see the patient much sooner if necessary.  Disclaimer: This note was dictated with voice recognition software. Similar sounding words can inadvertently be transcribed and may not be corrected upon review.

## 2017-02-19 NOTE — Telephone Encounter (Signed)
Gave avs and calendar for November  °

## 2017-02-19 NOTE — Patient Instructions (Signed)
[facility] Discharge Instructions for Patients Receiving Chemotherapy  Today you received the following chemotherapy agents Avastin, Carboplatin, Alimta  To help prevent nausea and vomiting after your treatment, we encourage you to take your nausea medication as directed   If you develop nausea and vomiting that is not controlled by your nausea medication, call the clinic.   BELOW ARE SYMPTOMS THAT SHOULD BE REPORTED IMMEDIATELY:  *FEVER GREATER THAN 100.5 F  *CHILLS WITH OR WITHOUT FEVER  NAUSEA AND VOMITING THAT IS NOT CONTROLLED WITH YOUR NAUSEA MEDICATION  *UNUSUAL SHORTNESS OF BREATH  *UNUSUAL BRUISING OR BLEEDING  TENDERNESS IN MOUTH AND THROAT WITH OR WITHOUT PRESENCE OF ULCERS  *URINARY PROBLEMS  *BOWEL PROBLEMS  UNUSUAL RASH Items with * indicate a potential emergency and should be followed up as soon as possible.  Feel free to call the clinic should you have any questions or concerns. The clinic phone number is [phone].  Please show the [facility] at check-in to the Emergency Department and triage nurse.

## 2017-02-26 ENCOUNTER — Other Ambulatory Visit: Payer: Self-pay | Admitting: Medical Oncology

## 2017-02-26 ENCOUNTER — Telehealth: Payer: Self-pay | Admitting: *Deleted

## 2017-02-26 ENCOUNTER — Other Ambulatory Visit (HOSPITAL_BASED_OUTPATIENT_CLINIC_OR_DEPARTMENT_OTHER): Payer: Medicare Other

## 2017-02-26 DIAGNOSIS — Z5111 Encounter for antineoplastic chemotherapy: Secondary | ICD-10-CM

## 2017-02-26 DIAGNOSIS — C3401 Malignant neoplasm of right main bronchus: Secondary | ICD-10-CM | POA: Diagnosis not present

## 2017-02-26 DIAGNOSIS — C3491 Malignant neoplasm of unspecified part of right bronchus or lung: Secondary | ICD-10-CM

## 2017-02-26 LAB — CBC WITH DIFFERENTIAL/PLATELET
BASO%: 0.3 % (ref 0.0–2.0)
Basophils Absolute: 0 10*3/uL (ref 0.0–0.1)
EOS%: 3.7 % (ref 0.0–7.0)
Eosinophils Absolute: 0.2 10*3/uL (ref 0.0–0.5)
HEMATOCRIT: 40.9 % (ref 38.4–49.9)
HGB: 14 g/dL (ref 13.0–17.1)
LYMPH#: 1 10*3/uL (ref 0.9–3.3)
LYMPH%: 25.8 % (ref 14.0–49.0)
MCH: 31.1 pg (ref 27.2–33.4)
MCHC: 34.3 g/dL (ref 32.0–36.0)
MCV: 90.7 fL (ref 79.3–98.0)
MONO#: 0.3 10*3/uL (ref 0.1–0.9)
MONO%: 6.3 % (ref 0.0–14.0)
NEUT#: 2.6 10*3/uL (ref 1.5–6.5)
NEUT%: 63.9 % (ref 39.0–75.0)
Platelets: 99 10*3/uL — ABNORMAL LOW (ref 140–400)
RBC: 4.51 10*6/uL (ref 4.20–5.82)
RDW: 15.8 % — AB (ref 11.0–14.6)
WBC: 4 10*3/uL (ref 4.0–10.3)

## 2017-02-26 LAB — COMPREHENSIVE METABOLIC PANEL
ALT: 34 U/L (ref 0–55)
ANION GAP: 11 meq/L (ref 3–11)
AST: 39 U/L — ABNORMAL HIGH (ref 5–34)
Albumin: 3.9 g/dL (ref 3.5–5.0)
Alkaline Phosphatase: 79 U/L (ref 40–150)
BUN: 24.5 mg/dL (ref 7.0–26.0)
CHLORIDE: 106 meq/L (ref 98–109)
CO2: 18 meq/L — AB (ref 22–29)
Calcium: 9.3 mg/dL (ref 8.4–10.4)
Creatinine: 1.5 mg/dL — ABNORMAL HIGH (ref 0.7–1.3)
EGFR: 45 mL/min/{1.73_m2} — AB (ref >60)
Glucose: 167 mg/dl — ABNORMAL HIGH (ref 70–140)
POTASSIUM: 4.9 meq/L (ref 3.5–5.1)
Sodium: 135 mEq/L — ABNORMAL LOW (ref 136–145)
Total Bilirubin: 0.74 mg/dL (ref 0.20–1.20)
Total Protein: 8.2 g/dL (ref 6.4–8.3)

## 2017-02-26 NOTE — Telephone Encounter (Signed)
"  My husband was there today for labs and then saw his family doctor.  We took last weeks lab results which lok anemic to this appointment with Dr. Edrick Oh.  He's extremely exhausted, sleeps a lot, just tired.  The way he feels and acts I wonder does he need iron?    After chemotherapy he's constipated then within four days after chemotherapy has frequent small amounts, one fourth a cup each time of  diarrhea.  Today our family doctor said take Mira lax for constipation and fiber.  The fiber will also help the diarrhea.  Has not tried either yet.  One night he was up five to six times then all day the next day with diarrhea.  Two small stools today since 4:00 am.  Took imodium last night.    He doesn't eat much.  He tries, but it doesn't go down good.  Has hiccups every time he eats, he gets tired and stops trying.  No nausea or vomiting, he just gets tired and stops eating.    No pain except for gas pain, he does pas gas.  No tenderness or swelling to his abdomen.    Could he get dehydrated?  He keeps something to drink at all times No clue how many ounces he drinks but sure he gets enough.   This chemotherapy is harder on him.  He's not getting better after treatments but gets worse and worse. Please call [phone] with any orders or advice."          Routing call information to collaborative nurse and provider for review.  Further patient communication through collaborative nurse.

## 2017-02-26 NOTE — Telephone Encounter (Signed)
I confirmed ct appts with pt and confirmed with CS that ct c/a/p is scheduled together.

## 2017-03-05 ENCOUNTER — Other Ambulatory Visit (HOSPITAL_BASED_OUTPATIENT_CLINIC_OR_DEPARTMENT_OTHER): Payer: Medicare Other

## 2017-03-05 DIAGNOSIS — Z5111 Encounter for antineoplastic chemotherapy: Secondary | ICD-10-CM

## 2017-03-05 DIAGNOSIS — C3491 Malignant neoplasm of unspecified part of right bronchus or lung: Secondary | ICD-10-CM

## 2017-03-05 DIAGNOSIS — C3401 Malignant neoplasm of right main bronchus: Secondary | ICD-10-CM

## 2017-03-05 LAB — CBC WITH DIFFERENTIAL/PLATELET
BASO%: 0.2 % (ref 0.0–2.0)
BASOS ABS: 0 10*3/uL (ref 0.0–0.1)
EOS%: 1.8 % (ref 0.0–7.0)
Eosinophils Absolute: 0.1 10*3/uL (ref 0.0–0.5)
HCT: 37.1 % — ABNORMAL LOW (ref 38.4–49.9)
HEMOGLOBIN: 12.5 g/dL — AB (ref 13.0–17.1)
LYMPH%: 14.7 % (ref 14.0–49.0)
MCH: 30.9 pg (ref 27.2–33.4)
MCHC: 33.6 g/dL (ref 32.0–36.0)
MCV: 91.9 fL (ref 79.3–98.0)
MONO#: 0.6 10*3/uL (ref 0.1–0.9)
MONO%: 14.2 % — AB (ref 0.0–14.0)
NEUT#: 3.1 10*3/uL (ref 1.5–6.5)
NEUT%: 69.1 % (ref 39.0–75.0)
Platelets: 92 10*3/uL — ABNORMAL LOW (ref 140–400)
RBC: 4.04 10*6/uL — ABNORMAL LOW (ref 4.20–5.82)
RDW: 16.9 % — AB (ref 11.0–14.6)
WBC: 4.6 10*3/uL (ref 4.0–10.3)
lymph#: 0.7 10*3/uL — ABNORMAL LOW (ref 0.9–3.3)

## 2017-03-05 LAB — COMPREHENSIVE METABOLIC PANEL
ALBUMIN: 3.6 g/dL (ref 3.5–5.0)
ALK PHOS: 88 U/L (ref 40–150)
ALT: 24 U/L (ref 0–55)
AST: 30 U/L (ref 5–34)
Anion Gap: 11 mEq/L (ref 3–11)
BUN: 22.3 mg/dL (ref 7.0–26.0)
CHLORIDE: 108 meq/L (ref 98–109)
CO2: 19 mEq/L — ABNORMAL LOW (ref 22–29)
Calcium: 8.4 mg/dL (ref 8.4–10.4)
Creatinine: 1.4 mg/dL — ABNORMAL HIGH (ref 0.7–1.3)
EGFR: 49 mL/min/{1.73_m2} — ABNORMAL LOW (ref >60)
GLUCOSE: 128 mg/dL (ref 70–140)
POTASSIUM: 4.7 meq/L (ref 3.5–5.1)
SODIUM: 138 meq/L (ref 136–145)
Total Bilirubin: 0.44 mg/dL (ref 0.20–1.20)
Total Protein: 7.8 g/dL (ref 6.4–8.3)

## 2017-03-08 ENCOUNTER — Ambulatory Visit (HOSPITAL_COMMUNITY)
Admission: RE | Admit: 2017-03-08 | Discharge: 2017-03-08 | Disposition: A | Discharge status: Home / Self Care | Payer: Medicare Other | Source: Ambulatory Visit | Attending: Internal Medicine | Admitting: Internal Medicine

## 2017-03-08 DIAGNOSIS — K5730 Diverticulosis of large intestine without perforation or abscess without bleeding: Secondary | ICD-10-CM | POA: Diagnosis not present

## 2017-03-08 DIAGNOSIS — E279 Disorder of adrenal gland, unspecified: Secondary | ICD-10-CM | POA: Diagnosis not present

## 2017-03-08 DIAGNOSIS — R918 Other nonspecific abnormal finding of lung field: Secondary | ICD-10-CM | POA: Diagnosis not present

## 2017-03-08 DIAGNOSIS — C3490 Malignant neoplasm of unspecified part of unspecified bronchus or lung: Secondary | ICD-10-CM | POA: Diagnosis present

## 2017-03-08 MED ORDER — IOPAMIDOL (ISOVUE-300) INJECTION 61%
100.0000 mL | Freq: Once | INTRAVENOUS | Status: AC | PRN
  Administered 2017-03-08: 100 mL via INTRAVENOUS

## 2017-03-08 MED ORDER — IOPAMIDOL (ISOVUE-300) INJECTION 61%
INTRAVENOUS | Status: AC
  Filled 2017-03-08: qty 100

## 2017-03-12 ENCOUNTER — Ambulatory Visit (HOSPITAL_BASED_OUTPATIENT_CLINIC_OR_DEPARTMENT_OTHER): Payer: Medicare Other | Admitting: Oncology

## 2017-03-12 ENCOUNTER — Ambulatory Visit (HOSPITAL_BASED_OUTPATIENT_CLINIC_OR_DEPARTMENT_OTHER): Payer: Medicare Other

## 2017-03-12 ENCOUNTER — Ambulatory Visit: Payer: Medicare Other

## 2017-03-12 ENCOUNTER — Encounter: Payer: Self-pay | Admitting: Oncology

## 2017-03-12 ENCOUNTER — Encounter: Payer: Self-pay | Admitting: *Deleted

## 2017-03-12 ENCOUNTER — Other Ambulatory Visit (HOSPITAL_BASED_OUTPATIENT_CLINIC_OR_DEPARTMENT_OTHER): Payer: Medicare Other

## 2017-03-12 VITALS — BP 117/78 | HR 87

## 2017-03-12 VITALS — BP 146/78 | HR 80 | Temp 97.5°F | Resp 19 | Ht 73.0 in | Wt 180.1 lb

## 2017-03-12 DIAGNOSIS — M2550 Pain in unspecified joint: Secondary | ICD-10-CM

## 2017-03-12 DIAGNOSIS — Z5112 Encounter for antineoplastic immunotherapy: Secondary | ICD-10-CM

## 2017-03-12 DIAGNOSIS — R5383 Other fatigue: Secondary | ICD-10-CM

## 2017-03-12 DIAGNOSIS — E279 Disorder of adrenal gland, unspecified: Secondary | ICD-10-CM | POA: Diagnosis not present

## 2017-03-12 DIAGNOSIS — Z5111 Encounter for antineoplastic chemotherapy: Secondary | ICD-10-CM

## 2017-03-12 DIAGNOSIS — C3401 Malignant neoplasm of right main bronchus: Secondary | ICD-10-CM

## 2017-03-12 DIAGNOSIS — C3491 Malignant neoplasm of unspecified part of right bronchus or lung: Secondary | ICD-10-CM

## 2017-03-12 DIAGNOSIS — Z95828 Presence of other vascular implants and grafts: Secondary | ICD-10-CM

## 2017-03-12 LAB — COMPREHENSIVE METABOLIC PANEL
ALK PHOS: 85 U/L (ref 40–150)
ALT: 21 U/L (ref 0–55)
AST: 24 U/L (ref 5–34)
Albumin: 3.7 g/dL (ref 3.5–5.0)
Anion Gap: 13 mEq/L — ABNORMAL HIGH (ref 3–11)
BILIRUBIN TOTAL: 0.35 mg/dL (ref 0.20–1.20)
BUN: 21.2 mg/dL (ref 7.0–26.0)
CO2: 19 meq/L — AB (ref 22–29)
Calcium: 8 mg/dL — ABNORMAL LOW (ref 8.4–10.4)
Chloride: 105 mEq/L (ref 98–109)
Creatinine: 1.3 mg/dL (ref 0.7–1.3)
EGFR: 52 mL/min/{1.73_m2} — ABNORMAL LOW (ref >60)
Glucose: 246 mg/dl — ABNORMAL HIGH (ref 70–140)
Potassium: 4.3 mEq/L (ref 3.5–5.1)
Sodium: 136 mEq/L (ref 136–145)
TOTAL PROTEIN: 7.8 g/dL (ref 6.4–8.3)

## 2017-03-12 LAB — TECHNOLOGIST REVIEW

## 2017-03-12 LAB — CBC WITH DIFFERENTIAL/PLATELET
BASO%: 0.1 % (ref 0.0–2.0)
BASOS ABS: 0 10*3/uL (ref 0.0–0.1)
EOS ABS: 0 10*3/uL (ref 0.0–0.5)
EOS%: 0 % (ref 0.0–7.0)
HCT: 32.9 % — ABNORMAL LOW (ref 38.4–49.9)
HGB: 11.5 g/dL — ABNORMAL LOW (ref 13.0–17.1)
LYMPH#: 0.9 10*3/uL (ref 0.9–3.3)
LYMPH%: 13.1 % — AB (ref 14.0–49.0)
MCH: 32.5 pg (ref 27.2–33.4)
MCHC: 35 g/dL (ref 32.0–36.0)
MCV: 92.9 fL (ref 79.3–98.0)
MONO#: 0.6 10*3/uL (ref 0.1–0.9)
MONO%: 8.9 % (ref 0.0–14.0)
NEUT%: 77.9 % — AB (ref 39.0–75.0)
NEUTROS ABS: 5.6 10*3/uL (ref 1.5–6.5)
PLATELETS: 141 10*3/uL (ref 140–400)
RBC: 3.54 10*6/uL — AB (ref 4.20–5.82)
RDW: 17.7 % — AB (ref 11.0–14.6)
WBC: 7.2 10*3/uL (ref 4.0–10.3)

## 2017-03-12 LAB — UA PROTEIN, DIPSTICK - CHCC: Protein, ur: <30 mg/dL

## 2017-03-12 MED ORDER — CYANOCOBALAMIN 1000 MCG/ML IJ SOLN
1000.0000 ug | Freq: Once | INTRAMUSCULAR | Status: AC
  Administered 2017-03-12: 1000 ug via INTRAMUSCULAR

## 2017-03-12 MED ORDER — SODIUM CHLORIDE 0.9% FLUSH
10.0000 mL | INTRAVENOUS | Status: DC | PRN
  Administered 2017-03-12: 10 mL
  Filled 2017-03-12: qty 10

## 2017-03-12 MED ORDER — PALONOSETRON HCL INJECTION 0.25 MG/5ML
INTRAVENOUS | Status: AC
  Filled 2017-03-12: qty 5

## 2017-03-12 MED ORDER — SODIUM CHLORIDE 0.9 % IV SOLN
Freq: Once | INTRAVENOUS | Status: AC
  Administered 2017-03-12: 11:00:00 via INTRAVENOUS

## 2017-03-12 MED ORDER — PALONOSETRON HCL INJECTION 0.25 MG/5ML
0.2500 mg | Freq: Once | INTRAVENOUS | Status: AC
  Administered 2017-03-12: 0.25 mg via INTRAVENOUS

## 2017-03-12 MED ORDER — SODIUM CHLORIDE 0.9 % IV SOLN
495.0000 mg/m2 | Freq: Once | INTRAVENOUS | Status: AC
  Administered 2017-03-12: 1000 mg via INTRAVENOUS
  Filled 2017-03-12: qty 40

## 2017-03-12 MED ORDER — SODIUM CHLORIDE 0.9 % IV SOLN
370.0000 mg | Freq: Once | INTRAVENOUS | Status: AC
  Administered 2017-03-12: 370 mg via INTRAVENOUS
  Filled 2017-03-12: qty 37

## 2017-03-12 MED ORDER — SODIUM CHLORIDE 0.9 % IV SOLN
1200.0000 mg | Freq: Once | INTRAVENOUS | Status: AC
  Administered 2017-03-12: 1200 mg via INTRAVENOUS
  Filled 2017-03-12: qty 48

## 2017-03-12 MED ORDER — SODIUM CHLORIDE 0.9 % IV SOLN: 400.0000 mg | Freq: Once | INTRAVENOUS | Status: DC

## 2017-03-12 MED ORDER — SODIUM CHLORIDE 0.9% FLUSH
10.0000 mL | Freq: Once | INTRAVENOUS | Status: AC
  Administered 2017-03-12: 10 mL
  Filled 2017-03-12: qty 10

## 2017-03-12 MED ORDER — HEPARIN SOD (PORK) LOCK FLUSH 100 UNIT/ML IV SOLN
500.0000 [IU] | Freq: Once | INTRAVENOUS | Status: AC | PRN
  Administered 2017-03-12: 500 [IU]
  Filled 2017-03-12: qty 5

## 2017-03-12 MED ORDER — SODIUM CHLORIDE 0.9 % IV SOLN
Freq: Once | INTRAVENOUS | Status: AC
  Administered 2017-03-12: 11:00:00 via INTRAVENOUS
  Filled 2017-03-12: qty 5

## 2017-03-12 MED ORDER — CYANOCOBALAMIN 1000 MCG/ML IJ SOLN
INTRAMUSCULAR | Status: AC
  Filled 2017-03-12: qty 1

## 2017-03-12 NOTE — Assessment & Plan Note (Signed)
This is a very pleasant 77 year old white male with unresectable a stage II a non-small cell lung cancer, adenocarcinoma presented with right hilar mass status post a course of concurrent chemoradiation with weekly carboplatin and paclitaxel. He has partial response to this treatment. The patient was on observation for a few months. The patient developed disease progression and he was started on systemic chemotherapy with carboplatin, Alimta and Avastin status post 3 cycles. He continues to tolerate his treatment well except for prolonged fatigue.  The patient was seen with Dr. Julien Nordmann.  CT scan results were discussed with the patient and his wife which show an improvement in his right hilar mass in the left adrenal nodule.  Recommend that he continue his current treatment. He will proceed with cycle #4 today as a scheduled.  Follow-up visit will be in 3 weeks for evaluation prior to cycle #5 of his treatment.  For his arthralgias, he may continue to use prednisone as directed by his rheumatologist.  The patient was advised to call immediately if he has any concerning symptoms in the interval. The patient voices understanding of current disease status and treatment options and is in agreement with the current care plan. All questions were answered. The patient knows to call the clinic with any problems, questions or concerns. We can certainly see the patient much sooner if necessary.

## 2017-03-12 NOTE — Progress Notes (Signed)
[facility] OFFICE PROGRESS NOTE  [name], Mark Howell [place] [address]  DIAGNOSIS: Metastatic non-small cell lung cancer, adenocarcinoma initially diagnosed as Unresectable stage IIA (T1a, N1, M0) non-small cell lung cancer, adenocarcinoma presented with a right hilar mass and right hilar adenopathy diagnosed in September 2017. [facility] 360: Negative for EGFR, ALK, ROS 1 and BRAF mutations.  PRIOR THERAPY: Status post concurrent chemoradiation with weekly carboplatin for AUC of 2 and paclitaxel 45 MG/M2 for 7 weeks.  CURRENT THERAPY: Systemic chemotherapy with carboplatin for AUC of 5, Alimta 500 MG/M2 and Avastin 15 MG/KG every 3 weeks. First dose 01/08/2017. Status post 2 cycles.  INTERVAL HISTORY: Mark Howell 76 y.o. male returns for routine follow-up visit accompanied by his wife.  The patient is having more weakness and fatigue following his last cycle of chemotherapy.  He had some increased arthralgias and his rheumatologist put him on a prednisone taper.  His arthralgias have improved with the prednisone.  The patient denies fevers and chills.  He denies chest pain, shortness of breath, cough, hemoptysis.  The patient has intermittent diarrhea which is controlled with Imodium.  Denies nausea, vomiting, constipation.  The patient is here for evaluation prior to cycle #4 of his treatment and to review his restaging CT scan results.  MEDICAL HISTORY: Past Medical History:  Diagnosis Date  . Arthritis   . Complication of anesthesia    problems voiding post op  . Full dentures   . GERD (gastroesophageal reflux disease)   . History of radiation therapy 01/25/16-02/28/16   right lung 45 Gy  . History of radiation therapy 04/17/16-05/02/16   right lung boost 20 Gy in 10 fractions cumulative dose 65 gray  . HOH (hard of hearing)   . HTN (hypertension)   . Hyperlipidemia   . Lung mass 12/30/2015  . Odynophagia 04/06/2016  . Psoriatic arthritis ([place])    . Snores   . Wears glasses     ALLERGIES:  is allergic to hydrocodone and oxycodone.  MEDICATIONS:  Current Outpatient Medications  Medication Sig Dispense Refill  . acetaminophen (TYLENOL) 500 MG tablet Take 500-1,000 mg by mouth 2 (two) times daily as needed for mild pain.    Marland Kitchen aspirin EC 81 MG tablet Take 81 mg by mouth at bedtime.     Marland Kitchen aspirin-sod bicarb-citric acid (ALKA-SELTZER) 325 MG TBEF tablet Take 650 mg by mouth daily as needed (indigestion).     Marland Kitchen dexamethasone (DECADRON) 4 MG tablet 4 mg by mouth twice a day the day before, day of and day after chemotherapy every 3 weeks. 40 tablet 1  . diphenhydramine-acetaminophen (TYLENOL PM) 25-500 MG TABS Take 1 tablet by mouth at bedtime. For sleep    . folic acid (FOLVITE) 1 MG tablet Take 1 tablet (1 mg total) by mouth daily. 30 tablet 4  . Homeopathic Products (LEG CRAMP COMPLEX PO) Take 2 tablets by mouth at bedtime as needed (leg cramp).     . lidocaine (XYLOCAINE) 2 % solution Use as directed 20 mLs in the mouth or throat every 3 (three) hours as needed for mouth pain. 100 mL 2  . lidocaine-prilocaine (EMLA) cream Apply 1 application topically as needed. (Patient taking differently: Apply 1 application topically as needed (port access). ) 30 g 0  . Lutein 10 MG TABS Take 10 mg by mouth at bedtime.     . magic mouthwash SOLN Take 5 mLs by mouth 4 (four) times daily as needed for mouth pain.  240 mL 2  . Methylcobalamin (METHYL B-12 PO) Take 1 tablet by mouth daily.    . Multiple Vitamin (MULITIVITAMIN WITH MINERALS) TABS Take 1 tablet by mouth daily.    Marland Kitchen nystatin (MYCOSTATIN/NYSTOP) powder Apply topically 3 (three) times daily. Apply to groin rash. 15 g 0  . pantoprazole (PROTONIX) 40 MG tablet Take 40 mg by mouth daily.     . pravastatin (PRAVACHOL) 40 MG tablet Take 40 mg by mouth at bedtime.     . predniSONE (DELTASONE) 10 MG tablet Take 5 mg by mouth daily.     . prochlorperazine (COMPAZINE) 10 MG tablet Take 1 tablet (10 mg  total) by mouth every 6 (six) hours as needed for nausea or vomiting. 30 tablet 0  . tamsulosin (FLOMAX) 0.4 MG CAPS capsule TAKE (1) CAPSULE DAILY, START 4 DAYS BEFORE PROCEDURE (Patient taking differently: take 1 capsule by mouth every morning) 30 capsule 2  . triamcinolone (NASACORT) 55 MCG/ACT AERO nasal inhaler Place 1 spray into the nose at bedtime.      No current facility-administered medications for this visit.    Facility-Administered Medications Ordered in Other Visits  Medication Dose Route Frequency Provider Last Rate Last Dose  . bevacizumab (AVASTIN) 1,200 mg in sodium chloride 0.9 % 100 mL chemo infusion  1,200 mg Intravenous Once Curt Bears, Mark Howell      . CARBOplatin (PARAPLATIN) 370 mg in sodium chloride 0.9 % 250 mL chemo infusion  370 mg Intravenous Once Curt Bears, Mark Howell      . fosaprepitant (EMEND) 150 mg, dexamethasone (DECADRON) 12 mg in sodium chloride 0.9 % 145 mL IVPB   Intravenous Once Curt Bears, Mark Howell 454 mL/hr at 03/12/17 1053    . heparin lock flush 100 unit/mL  500 Units Intracatheter Once PRN Curt Bears, Mark Howell      . PEMEtrexed (ALIMTA) 1,000 mg in sodium chloride 0.9 % 100 mL chemo infusion  495 mg/m2 (Treatment Plan Recorded) Intravenous Once Curt Bears, Mark Howell      . sodium chloride flush (NS) 0.9 % injection 10 mL  10 mL Intracatheter PRN Curt Bears, Mark Howell        SURGICAL HISTORY:  Past Surgical History:  Procedure Laterality Date  . COLONOSCOPY    . IR FLUORO GUIDE PORT INSERTION RIGHT  01/02/2017  . IR US GUIDE VASC ACCESS RIGHT  01/02/2017    REVIEW OF SYSTEMS:   Review of Systems  Constitutional: Negative for appetite change, chills, fever and unexpected weight change. Positive for fatigue. HENT:   Negative for mouth sores, nosebleeds, sore throat and trouble swallowing.   Eyes: Negative for eye problems and icterus.  Respiratory: Negative for cough, hemoptysis, shortness of breath and wheezing.   Cardiovascular: Negative for chest  pain and leg swelling.  Gastrointestinal: Negative for abdominal pain, constipation, nausea and vomiting. Positive for diarrhea that is controlled with Imodium. Genitourinary: Negative for bladder incontinence, difficulty urinating, dysuria, frequency and hematuria.   Musculoskeletal: Negative for back pain, gait problem, neck pain and neck stiffness. Positive for arthralgias particularly in his shoulders.  He is currently on a prednisone taper with improvement. Skin: Negative for itching and rash.  Neurological: Negative for dizziness, extremity weakness, gait problem, headaches, light-headedness and seizures.  Hematological: Negative for adenopathy. Does not bruise/bleed easily.  Psychiatric/Behavioral: Negative for confusion, depression and sleep disturbance. The patient is not nervous/anxious.     PHYSICAL EXAMINATION:  Blood pressure (!) 146/78, pulse 80, temperature (!) 97.5 F (36.4 C), temperature source Oral, resp. rate  19, height _0  (1.854 m), weight 180 lb 1.6 oz (81.7 kg), SpO2 100 %.  ECOG PERFORMANCE STATUS: 1 - Symptomatic but completely ambulatory  Physical Exam  Constitutional: Oriented to person, place, and time and well-developed, well-nourished, and in no distress. No distress.  HENT:  Head: Normocephalic and atraumatic.  Mouth/Throat: Oropharynx is clear and moist. No oropharyngeal exudate.  Eyes: Conjunctivae are normal. Right eye exhibits no discharge. Left eye exhibits no discharge. No scleral icterus.  Neck: Normal range of motion. Neck supple.  Cardiovascular: Normal rate, regular rhythm, normal heart sounds and intact distal pulses.   Pulmonary/Chest: Effort normal and breath sounds normal. No respiratory distress. No wheezes. No rales.  Abdominal: Soft. Bowel sounds are normal. Exhibits no distension and no mass. There is no tenderness.  Musculoskeletal: Normal range of motion. Exhibits no edema.  Lymphadenopathy:    No cervical adenopathy.  Neurological:  Alert and oriented to person, place, and time. Exhibits normal muscle tone. Gait normal. Coordination normal.  Skin: Skin is warm and dry. No rash noted. Not diaphoretic. No erythema. No pallor.  Psychiatric: Mood, memory and judgment normal.  Vitals reviewed.  LABORATORY DATA: Lab Results  Component Value Date   WBC 7.2 03/12/2017   HGB 11.5 (L) 03/12/2017   HCT 32.9 (L) 03/12/2017   MCV 92.9 03/12/2017   PLT 141 03/12/2017      Chemistry      Component Value Date/Time   NA 136 03/12/2017 0858   K 4.3 03/12/2017 0858   CL 103 01/02/2017 0731   CO2 19 (L) 03/12/2017 0858   BUN 21.2 03/12/2017 0858   CREATININE 1.3 03/12/2017 0858      Component Value Date/Time   CALCIUM 8.0 (L) 03/12/2017 0858   ALKPHOS 85 03/12/2017 0858   AST 24 03/12/2017 0858   ALT 21 03/12/2017 0858   BILITOT 0.35 03/12/2017 0858       RADIOGRAPHIC STUDIES:  Ct Chest W Contrast  Result Date: 03/08/2017 CLINICAL DATA:  Patient with history of lung cancer. Follow-up evaluation. EXAM: CT CHEST, ABDOMEN, AND PELVIS WITH CONTRAST TECHNIQUE: Multidetector CT imaging of the chest, abdomen and pelvis was performed following the standard protocol during bolus administration of intravenous contrast. CONTRAST:  181m ISOVUE-300 IOPAMIDOL (ISOVUE-300) INJECTION 61% COMPARISON:  PET-CT 12/23/2016. FINDINGS: CT CHEST FINDINGS Cardiovascular: Right anterior chest wall Port-A-Cath is present with tip terminating in the superior vena cava. Normal heart size. No pericardial effusion. Mediastinum/Nodes: No enlarged axillary, mediastinal or hilar lymphadenopathy. Normal esophagus. Interval decrease in size of right hilar mass measuring 1.3 x 1.0 cm (image 26; series 2), previously 2.8 x 1.8 cm. Lungs/Pleura: Central airways are patent. Centrilobular and paraseptal emphysematous changes. Unchanged scarring within the left upper and left lower lobes. Grossly unchanged right paramediastinal post radiation changes.  Similar-appearing 8 mm subpleural right middle lobe nodule (image 82; series 4). Stable 3 mm right lower lobe nodule (image 87; series 4). No pleural effusion or pneumothorax. Musculoskeletal: Thoracic spine degenerative changes. No aggressive or acute appearing osseous lesions. CT ABDOMEN PELVIS FINDINGS Hepatobiliary: Liver is normal in size and contour. No focal lesion identified. Gallbladder is unremarkable. No intrahepatic or extrahepatic biliary ductal dilatation. Pancreas: Unremarkable Spleen: Unremarkable Adrenals/Urinary Tract: Interval decrease in size of left adrenal nodule measuring 2.6 cm (image 65; series 2), previously 3.2 cm. Grossly unchanged 1.7 cm right adrenal nodule (image 59; series 2). Kidneys enhance symmetrically with contrast. Bilateral nephrolithiasis. Focal atrophy superior pole right kidney. Simple cyst inferior pole left kidney. Mild  wall thickening in the urinary bladder, likely secondary to outlet obstruction. Stomach/Bowel: Descending and sigmoid colonic diverticulosis. Mild fat stranding about the sigmoid colon (image 107; series 2). Normal morphology of the stomach. No evidence for bowel obstruction. No free fluid or free intraperitoneal air. Vascular/Lymphatic: Infrarenal abdominal aortic ectasia. Peripheral calcified atherosclerotic plaque. No retroperitoneal lymphadenopathy. Marked narrowing at the origin of the superior mesenteric artery. Reproductive: Prostate is enlarged. Other: Bilateral fat containing inguinal hernias. Musculoskeletal: Bilateral hip joint degenerative changes. Lumbar spine degenerative changes. No aggressive or acute appearing osseous lesions. IMPRESSION: 1. Interval decrease in size of right hilar mass. 2. Interval decrease in size of left adrenal nodule. Stable right adrenal nodule. 3. Mild fat stranding about a sigmoid colonic diverticuli suggestive of mild acute diverticulitis. These results will be called to the ordering clinician or representative by  the Radiologist Assistant, and communication documented in the PACS or zVision Dashboard. Electronically Signed   By: Lovey Newcomer M.D.   On: 03/08/2017 17:21   Ct Abdomen Pelvis W Contrast  Result Date: 03/08/2017 CLINICAL DATA:  Patient with history of lung cancer. Follow-up evaluation. EXAM: CT CHEST, ABDOMEN, AND PELVIS WITH CONTRAST TECHNIQUE: Multidetector CT imaging of the chest, abdomen and pelvis was performed following the standard protocol during bolus administration of intravenous contrast. CONTRAST:  191m ISOVUE-300 IOPAMIDOL (ISOVUE-300) INJECTION 61% COMPARISON:  PET-CT 12/23/2016. FINDINGS: CT CHEST FINDINGS Cardiovascular: Right anterior chest wall Port-A-Cath is present with tip terminating in the superior vena cava. Normal heart size. No pericardial effusion. Mediastinum/Nodes: No enlarged axillary, mediastinal or hilar lymphadenopathy. Normal esophagus. Interval decrease in size of right hilar mass measuring 1.3 x 1.0 cm (image 26; series 2), previously 2.8 x 1.8 cm. Lungs/Pleura: Central airways are patent. Centrilobular and paraseptal emphysematous changes. Unchanged scarring within the left upper and left lower lobes. Grossly unchanged right paramediastinal post radiation changes. Similar-appearing 8 mm subpleural right middle lobe nodule (image 82; series 4). Stable 3 mm right lower lobe nodule (image 87; series 4). No pleural effusion or pneumothorax. Musculoskeletal: Thoracic spine degenerative changes. No aggressive or acute appearing osseous lesions. CT ABDOMEN PELVIS FINDINGS Hepatobiliary: Liver is normal in size and contour. No focal lesion identified. Gallbladder is unremarkable. No intrahepatic or extrahepatic biliary ductal dilatation. Pancreas: Unremarkable Spleen: Unremarkable Adrenals/Urinary Tract: Interval decrease in size of left adrenal nodule measuring 2.6 cm (image 65; series 2), previously 3.2 cm. Grossly unchanged 1.7 cm right adrenal nodule (image 59; series 2).  Kidneys enhance symmetrically with contrast. Bilateral nephrolithiasis. Focal atrophy superior pole right kidney. Simple cyst inferior pole left kidney. Mild wall thickening in the urinary bladder, likely secondary to outlet obstruction. Stomach/Bowel: Descending and sigmoid colonic diverticulosis. Mild fat stranding about the sigmoid colon (image 107; series 2). Normal morphology of the stomach. No evidence for bowel obstruction. No free fluid or free intraperitoneal air. Vascular/Lymphatic: Infrarenal abdominal aortic ectasia. Peripheral calcified atherosclerotic plaque. No retroperitoneal lymphadenopathy. Marked narrowing at the origin of the superior mesenteric artery. Reproductive: Prostate is enlarged. Other: Bilateral fat containing inguinal hernias. Musculoskeletal: Bilateral hip joint degenerative changes. Lumbar spine degenerative changes. No aggressive or acute appearing osseous lesions. IMPRESSION: 1. Interval decrease in size of right hilar mass. 2. Interval decrease in size of left adrenal nodule. Stable right adrenal nodule. 3. Mild fat stranding about a sigmoid colonic diverticuli suggestive of mild acute diverticulitis. These results will be called to the ordering clinician or representative by the Radiologist Assistant, and communication documented in the PACS or zVision Dashboard. Electronically Signed  By: Lovey Newcomer M.D.   On: 03/08/2017 17:21     ASSESSMENT/PLAN:  Adenocarcinoma of right lung, stage 4 (HCC) This is a very pleasant 76 year old white male with unresectable a stage II a non-small cell lung cancer, adenocarcinoma presented with right hilar mass status post a course of concurrent chemoradiation with weekly carboplatin and paclitaxel. He has partial response to this treatment. The patient was on observation for a few months. The patient developed disease progression and he was started on systemic chemotherapy with carboplatin, Alimta and Avastin status post 3 cycles. He  continues to tolerate his treatment well except for prolonged fatigue.  The patient was seen with Dr. Julien Nordmann.  CT scan results were discussed with the patient and his wife which show an improvement in his right hilar mass in the left adrenal nodule.  Recommend that he continue his current treatment. He will proceed with cycle #4 today as a scheduled.  Follow-up visit will be in 3 weeks for evaluation prior to cycle #5 of his treatment.  For his arthralgias, he may continue to use prednisone as directed by his rheumatologist.  The patient was advised to call immediately if he has any concerning symptoms in the interval. The patient voices understanding of current disease status and treatment options and is in agreement with the current care plan. All questions were answered. The patient knows to call the clinic with any problems, questions or concerns. We can certainly see the patient much sooner if necessary.  No orders of the defined types were placed in this encounter.  Mikey Bussing, DNP, AGPCNP-BC, AOCNP 03/12/17  ADDENDUM: Hematology/Oncology Attending: I had a face-to-face encounter with the patient today.  I recommended his care plan.  This is a very pleasant 76 years old white male with metastatic non-small cell lung cancer that was initially diagnosed as unresectable stage II a adenocarcinoma.  The patient is currently undergoing systemic chemotherapy with carboplatin, Alimta and Avastin status post 3 cycles.  He has been tolerating his treatment fairly well except for the increased arthralgia.  He is currently on treatment with prednisone by his rheumatologist for his arthritis. The patient had repeat CT scan of the chest, abdomen and pelvis performed recently.  I personally and independently reviewed the scans and discussed the results with the patient and his wife. He has a scan showed improvement of his disease with decrease in the size of the right hilar mass as well as the  adrenal nodule. I recommended for the patient to continue his current treatment with carboplatin, Alimta and Avastin and he will proceed with cycle #4 today. We will see the patient back for follow-up visit in 3 weeks for evaluation before starting cycle #5.  The patient was advised to call immediately if he has any concerning symptoms in the interval.  Disclaimer: This note was dictated with voice recognition software. Similar sounding words can inadvertently be transcribed and may be missed upon review. Eilleen Kempf, Mark Howell 03/12/17

## 2017-03-12 NOTE — Patient Instructions (Signed)
[facility] Discharge Instructions for Patients Receiving Chemotherapy  Today you received the following chemotherapy agents Avastin,Alimta Carboplatin  To help prevent nausea and vomiting after your treatment, we encourage you to take your nausea medication as directed If you develop nausea and vomiting that is not controlled by your nausea medication, call the clinic.   BELOW ARE SYMPTOMS THAT SHOULD BE REPORTED IMMEDIATELY:  *FEVER GREATER THAN 100.5 F  *CHILLS WITH OR WITHOUT FEVER  NAUSEA AND VOMITING THAT IS NOT CONTROLLED WITH YOUR NAUSEA MEDICATION  *UNUSUAL SHORTNESS OF BREATH  *UNUSUAL BRUISING OR BLEEDING  TENDERNESS IN MOUTH AND THROAT WITH OR WITHOUT PRESENCE OF ULCERS  *URINARY PROBLEMS  *BOWEL PROBLEMS  UNUSUAL RASH Items with * indicate a potential emergency and should be followed up as soon as possible.  Feel free to call the clinic should you have any questions or concerns. The clinic phone number is [phone].  Please show the [facility] at check-in to the Emergency Department and triage nurse.

## 2017-03-14 ENCOUNTER — Telehealth: Payer: Self-pay | Admitting: Internal Medicine

## 2017-03-14 NOTE — Telephone Encounter (Signed)
Called patient regarding current schedule °

## 2017-03-19 ENCOUNTER — Other Ambulatory Visit: Payer: Medicare Other

## 2017-03-19 DIAGNOSIS — C3491 Malignant neoplasm of unspecified part of right bronchus or lung: Secondary | ICD-10-CM

## 2017-03-19 LAB — RESEARCH LABS

## 2017-03-26 ENCOUNTER — Other Ambulatory Visit (HOSPITAL_BASED_OUTPATIENT_CLINIC_OR_DEPARTMENT_OTHER): Payer: Medicare Other

## 2017-03-26 DIAGNOSIS — C3401 Malignant neoplasm of right main bronchus: Secondary | ICD-10-CM | POA: Diagnosis not present

## 2017-03-26 DIAGNOSIS — Z5111 Encounter for antineoplastic chemotherapy: Secondary | ICD-10-CM

## 2017-03-26 DIAGNOSIS — C3491 Malignant neoplasm of unspecified part of right bronchus or lung: Secondary | ICD-10-CM

## 2017-03-26 LAB — CBC WITH DIFFERENTIAL/PLATELET
BASO%: 0 % (ref 0.0–2.0)
BASOS ABS: 0 10*3/uL (ref 0.0–0.1)
EOS ABS: 0 10*3/uL (ref 0.0–0.5)
EOS%: 0.6 % (ref 0.0–7.0)
HCT: 34.3 % — ABNORMAL LOW (ref 38.4–49.9)
HEMOGLOBIN: 11.7 g/dL — AB (ref 13.0–17.1)
LYMPH%: 12.6 % — AB (ref 14.0–49.0)
MCH: 32.5 pg (ref 27.2–33.4)
MCHC: 34.1 g/dL (ref 32.0–36.0)
MCV: 95.3 fL (ref 79.3–98.0)
MONO#: 0.5 10*3/uL (ref 0.1–0.9)
MONO%: 9 % (ref 0.0–14.0)
NEUT%: 77.8 % — ABNORMAL HIGH (ref 39.0–75.0)
NEUTROS ABS: 4.1 10*3/uL (ref 1.5–6.5)
PLATELETS: 62 10*3/uL — AB (ref 140–400)
RBC: 3.6 10*6/uL — AB (ref 4.20–5.82)
RDW: 18.6 % — ABNORMAL HIGH (ref 11.0–14.6)
WBC: 5.2 10*3/uL (ref 4.0–10.3)
lymph#: 0.7 10*3/uL — ABNORMAL LOW (ref 0.9–3.3)

## 2017-03-26 LAB — COMPREHENSIVE METABOLIC PANEL
ALT: 20 U/L (ref 0–55)
AST: 23 U/L (ref 5–34)
Albumin: 3.5 g/dL (ref 3.5–5.0)
Alkaline Phosphatase: 84 U/L (ref 40–150)
Anion Gap: 12 mEq/L — ABNORMAL HIGH (ref 3–11)
BUN: 24.8 mg/dL (ref 7.0–26.0)
CO2: 18 mEq/L — ABNORMAL LOW (ref 22–29)
Calcium: 8 mg/dL — ABNORMAL LOW (ref 8.4–10.4)
Chloride: 107 mEq/L (ref 98–109)
Creatinine: 1.4 mg/dL — ABNORMAL HIGH (ref 0.7–1.3)
EGFR: 50 mL/min/{1.73_m2} — ABNORMAL LOW (ref >60)
Glucose: 165 mg/dl — ABNORMAL HIGH (ref 70–140)
POTASSIUM: 4.9 meq/L (ref 3.5–5.1)
SODIUM: 137 meq/L (ref 136–145)
Total Bilirubin: 0.51 mg/dL (ref 0.20–1.20)
Total Protein: 7.8 g/dL (ref 6.4–8.3)

## 2017-04-02 ENCOUNTER — Ambulatory Visit (HOSPITAL_BASED_OUTPATIENT_CLINIC_OR_DEPARTMENT_OTHER): Payer: Medicare Other | Admitting: Internal Medicine

## 2017-04-02 ENCOUNTER — Ambulatory Visit (HOSPITAL_BASED_OUTPATIENT_CLINIC_OR_DEPARTMENT_OTHER): Payer: Medicare Other

## 2017-04-02 ENCOUNTER — Telehealth: Payer: Self-pay | Admitting: Oncology

## 2017-04-02 ENCOUNTER — Other Ambulatory Visit (HOSPITAL_BASED_OUTPATIENT_CLINIC_OR_DEPARTMENT_OTHER): Payer: Medicare Other

## 2017-04-02 ENCOUNTER — Ambulatory Visit: Payer: Medicare Other

## 2017-04-02 VITALS — BP 126/85

## 2017-04-02 VITALS — BP 142/97 | HR 98 | Temp 97.7°F | Resp 18 | Ht 73.0 in | Wt 179.2 lb

## 2017-04-02 DIAGNOSIS — Z5112 Encounter for antineoplastic immunotherapy: Secondary | ICD-10-CM

## 2017-04-02 DIAGNOSIS — Z5111 Encounter for antineoplastic chemotherapy: Secondary | ICD-10-CM

## 2017-04-02 DIAGNOSIS — R5383 Other fatigue: Secondary | ICD-10-CM

## 2017-04-02 DIAGNOSIS — C3491 Malignant neoplasm of unspecified part of right bronchus or lung: Secondary | ICD-10-CM

## 2017-04-02 DIAGNOSIS — C3401 Malignant neoplasm of right main bronchus: Secondary | ICD-10-CM

## 2017-04-02 DIAGNOSIS — Z95828 Presence of other vascular implants and grafts: Secondary | ICD-10-CM

## 2017-04-02 LAB — COMPREHENSIVE METABOLIC PANEL
ALBUMIN: 3.5 g/dL (ref 3.5–5.0)
ALK PHOS: 73 U/L (ref 40–150)
ALT: 24 U/L (ref 0–55)
AST: 27 U/L (ref 5–34)
Anion Gap: 12 mEq/L — ABNORMAL HIGH (ref 3–11)
BUN: 19.1 mg/dL (ref 7.0–26.0)
CALCIUM: 7.4 mg/dL — AB (ref 8.4–10.4)
CHLORIDE: 107 meq/L (ref 98–109)
CO2: 18 mEq/L — ABNORMAL LOW (ref 22–29)
CREATININE: 1.3 mg/dL (ref 0.7–1.3)
EGFR: 51 mL/min/{1.73_m2} — ABNORMAL LOW (ref >60)
Glucose: 198 mg/dl — ABNORMAL HIGH (ref 70–140)
Potassium: 4.8 mEq/L (ref 3.5–5.1)
Sodium: 137 mEq/L (ref 136–145)
TOTAL PROTEIN: 7.4 g/dL (ref 6.4–8.3)
Total Bilirubin: 0.41 mg/dL (ref 0.20–1.20)

## 2017-04-02 LAB — CBC WITH DIFFERENTIAL/PLATELET
BASO%: 0.6 % (ref 0.0–2.0)
Basophils Absolute: 0 10*3/uL (ref 0.0–0.1)
EOS%: 0.1 % (ref 0.0–7.0)
Eosinophils Absolute: 0 10*3/uL (ref 0.0–0.5)
HEMATOCRIT: 31.2 % — AB (ref 38.4–49.9)
HEMOGLOBIN: 10.6 g/dL — AB (ref 13.0–17.1)
LYMPH%: 11.1 % — ABNORMAL LOW (ref 14.0–49.0)
MCH: 32.9 pg (ref 27.2–33.4)
MCHC: 33.9 g/dL (ref 32.0–36.0)
MCV: 97.1 fL (ref 79.3–98.0)
MONO#: 0.4 10*3/uL (ref 0.1–0.9)
MONO%: 8 % (ref 0.0–14.0)
NEUT%: 80.2 % — ABNORMAL HIGH (ref 39.0–75.0)
NEUTROS ABS: 3.8 10*3/uL (ref 1.5–6.5)
Platelets: 125 10*3/uL — ABNORMAL LOW (ref 140–400)
RBC: 3.22 10*6/uL — ABNORMAL LOW (ref 4.20–5.82)
RDW: 20.7 % — ABNORMAL HIGH (ref 11.0–14.6)
WBC: 4.8 10*3/uL (ref 4.0–10.3)
lymph#: 0.5 10*3/uL — ABNORMAL LOW (ref 0.9–3.3)

## 2017-04-02 LAB — UA PROTEIN, DIPSTICK - CHCC: PROTEIN: 30 mg/dL

## 2017-04-02 MED ORDER — FAMOTIDINE IN NACL 20-0.9 MG/50ML-% IV SOLN
INTRAVENOUS | Status: AC
  Filled 2017-04-02: qty 50

## 2017-04-02 MED ORDER — CARBOPLATIN CHEMO INJECTION 600 MG/60ML
397.5000 mg | Freq: Once | INTRAVENOUS | Status: AC
  Administered 2017-04-02: 400 mg via INTRAVENOUS
  Filled 2017-04-02: qty 40

## 2017-04-02 MED ORDER — SODIUM CHLORIDE 0.9 % IV SOLN
14.8000 mg/kg | Freq: Once | INTRAVENOUS | Status: AC
  Administered 2017-04-02: 1200 mg via INTRAVENOUS
  Filled 2017-04-02: qty 48

## 2017-04-02 MED ORDER — SODIUM CHLORIDE 0.9% FLUSH
10.0000 mL | INTRAVENOUS | Status: DC | PRN
  Administered 2017-04-02: 10 mL
  Filled 2017-04-02: qty 10

## 2017-04-02 MED ORDER — FAMOTIDINE IN NACL 20-0.9 MG/50ML-% IV SOLN
20.0000 mg | Freq: Once | INTRAVENOUS | Status: AC
  Administered 2017-04-02: 20 mg via INTRAVENOUS

## 2017-04-02 MED ORDER — DIPHENHYDRAMINE HCL 50 MG/ML IJ SOLN
INTRAMUSCULAR | Status: AC
  Filled 2017-04-02: qty 1

## 2017-04-02 MED ORDER — PALONOSETRON HCL INJECTION 0.25 MG/5ML
0.2500 mg | Freq: Once | INTRAVENOUS | Status: AC
  Administered 2017-04-02: 0.25 mg via INTRAVENOUS

## 2017-04-02 MED ORDER — HEPARIN SOD (PORK) LOCK FLUSH 100 UNIT/ML IV SOLN
500.0000 [IU] | Freq: Once | INTRAVENOUS | Status: AC | PRN
  Administered 2017-04-02: 500 [IU]
  Filled 2017-04-02: qty 5

## 2017-04-02 MED ORDER — PALONOSETRON HCL INJECTION 0.25 MG/5ML
INTRAVENOUS | Status: AC
  Filled 2017-04-02: qty 5

## 2017-04-02 MED ORDER — SODIUM CHLORIDE 0.9 % IV SOLN
495.0000 mg/m2 | Freq: Once | INTRAVENOUS | Status: AC
  Administered 2017-04-02: 1000 mg via INTRAVENOUS
  Filled 2017-04-02: qty 40

## 2017-04-02 MED ORDER — SODIUM CHLORIDE 0.9 % IV SOLN
Freq: Once | INTRAVENOUS | Status: AC
  Administered 2017-04-02: 11:00:00 via INTRAVENOUS

## 2017-04-02 MED ORDER — SODIUM CHLORIDE 0.9% FLUSH
10.0000 mL | Freq: Once | INTRAVENOUS | Status: AC
  Administered 2017-04-02: 10 mL
  Filled 2017-04-02: qty 10

## 2017-04-02 MED ORDER — SODIUM CHLORIDE 0.9 % IV SOLN
Freq: Once | INTRAVENOUS | Status: AC
  Administered 2017-04-02: 11:00:00 via INTRAVENOUS
  Filled 2017-04-02: qty 5

## 2017-04-02 MED ORDER — DIPHENHYDRAMINE HCL 50 MG/ML IJ SOLN
50.0000 mg | Freq: Once | INTRAMUSCULAR | Status: AC
  Administered 2017-04-02: 50 mg via INTRAVENOUS

## 2017-04-02 NOTE — Patient Instructions (Signed)
[facility] Discharge Instructions for Patients Receiving Chemotherapy  Today you received the following chemotherapy agents Avastin,Alimta Carboplatin  To help prevent nausea and vomiting after your treatment, we encourage you to take your nausea medication as directed If you develop nausea and vomiting that is not controlled by your nausea medication, call the clinic.   BELOW ARE SYMPTOMS THAT SHOULD BE REPORTED IMMEDIATELY:  *FEVER GREATER THAN 100.5 F  *CHILLS WITH OR WITHOUT FEVER  NAUSEA AND VOMITING THAT IS NOT CONTROLLED WITH YOUR NAUSEA MEDICATION  *UNUSUAL SHORTNESS OF BREATH  *UNUSUAL BRUISING OR BLEEDING  TENDERNESS IN MOUTH AND THROAT WITH OR WITHOUT PRESENCE OF ULCERS  *URINARY PROBLEMS  *BOWEL PROBLEMS  UNUSUAL RASH Items with * indicate a potential emergency and should be followed up as soon as possible.  Feel free to call the clinic should you have any questions or concerns. The clinic phone number is [phone].  Please show the [facility] at check-in to the Emergency Department and triage nurse.

## 2017-04-02 NOTE — Patient Instructions (Signed)
Implanted Port Home Guide An implanted port is a type of central line that is placed under the skin. Central lines are used to provide IV access when treatment or nutrition needs to be given through a person's veins. Implanted ports are used for long-term IV access. An implanted port may be placed because:  You need IV medicine that would be irritating to the small veins in your hands or arms.  You need long-term IV medicines, such as antibiotics.  You need IV nutrition for a long period.  You need frequent blood draws for lab tests.  You need dialysis.  Implanted ports are usually placed in the chest area, but they can also be placed in the upper arm, the abdomen, or the leg. An implanted port has two main parts:  Reservoir. The reservoir is round and will appear as a small, raised area under your skin. The reservoir is the part where a needle is inserted to give medicines or draw blood.  Catheter. The catheter is a thin, flexible tube that extends from the reservoir. The catheter is placed into a large vein. Medicine that is inserted into the reservoir goes into the catheter and then into the vein.  How will I care for my incision site? Do not get the incision site wet. Bathe or shower as directed by your health care provider. How is my port accessed? Special steps must be taken to access the port:  Before the port is accessed, a numbing cream can be placed on the skin. This helps numb the skin over the port site.  Your health care provider uses a sterile technique to access the port. ? Your health care provider must put on a mask and sterile gloves. ? The skin over your port is cleaned carefully with an antiseptic and allowed to dry. ? The port is gently pinched between sterile gloves, and a needle is inserted into the port.  Only "non-coring" port needles should be used to access the port. Once the port is accessed, a blood return should be checked. This helps ensure that the port  is in the vein and is not clogged.  If your port needs to remain accessed for a constant infusion, a clear (transparent) bandage will be placed over the needle site. The bandage and needle will need to be changed every week, or as directed by your health care provider.  Keep the bandage covering the needle clean and dry. Do not get it wet. Follow your health care provider's instructions on how to take a shower or bath while the port is accessed.  If your port does not need to stay accessed, no bandage is needed over the port.  What is flushing? Flushing helps keep the port from getting clogged. Follow your health care provider's instructions on how and when to flush the port. Ports are usually flushed with saline solution or a medicine called heparin. The need for flushing will depend on how the port is used.  If the port is used for intermittent medicines or blood draws, the port will need to be flushed: ? After medicines have been given. ? After blood has been drawn. ? As part of routine maintenance.  If a constant infusion is running, the port may not need to be flushed.  How long will my port stay implanted? The port can stay in for as long as your health care provider thinks it is needed. When it is time for the port to come out, surgery will be   done to remove it. The procedure is similar to the one performed when the port was put in. When should I seek immediate medical care? When you have an implanted port, you should seek immediate medical care if:  You notice a bad smell coming from the incision site.  You have swelling, redness, or drainage at the incision site.  You have more swelling or pain at the port site or the surrounding area.  You have a fever that is not controlled with medicine.  This information is not intended to replace advice given to you by your health care provider. Make sure you discuss any questions you have with your health care provider. Document  Released: 04/17/2005 Document Revised: 09/23/2015 Document Reviewed: 12/23/2012 Elsevier Interactive Patient Education  2017 [employer] Inc[employer]  

## 2017-04-02 NOTE — Progress Notes (Signed)
[place] [facility] [phone]   [phone]  OFFICE PROGRESS NOTE  [name], MD [place] [address]  DIAGNOSIS: Metastatic non-small cell lung cancer, adenocarcinoma initially diagnosed as Unresectable stage IIA (T1a, N1, M0) non-small cell lung cancer, adenocarcinoma presented with a right hilar mass and right hilar adenopathy diagnosed in September 2017. [facility]ant 360: Negative for EGFR, ALK, ROS 1 and BRAF mutations.  PRIOR THERAPY: Status post concurrent chemoradiation with weekly carboplatin for AUC of 2 and paclitaxel 45 MG/M2 for 7 weeks.  CURRENT THERAPY: Systemic chemotherapy with carboplatin for AUC of 5, Alimta 500 MG/M2 and Avastin 15 MG/KG every 3 weeks. First dose 01/08/2017. Status post 4 cycles.  INTERVAL HISTORY: KAMARRION STFORT 76 y.o. male returns to the clinic today for follow-up visit accompanied by his wife.  The patient is feeling fine today with no specific complaints except for fatigue.  He continues to tolerate his systemic chemotherapy fairly except for the running eyes and nose as well as fatigue for several days after the treatment.  He denied having any current chest pain, shortness of breath, cough or hemoptysis.  He denied having any fever or chills.  He has no nausea, vomiting, diarrhea or constipation.  The patient is here today for evaluation before starting cycle #5.   MEDICAL HISTORY: Past Medical History:  Diagnosis Date  . Arthritis   . Complication of anesthesia    problems voiding post op  . Full dentures   . GERD (gastroesophageal reflux disease)   . History of radiation therapy 01/25/16-02/28/16   right lung 45 Gy  . History of radiation therapy 04/17/16-05/02/16   right lung boost 20 Gy in 10 fractions cumulative dose 65 gray  . HOH (hard of hearing)   . HTN (hypertension)   . Hyperlipidemia   . Lung mass 12/30/2015  . Odynophagia 04/06/2016  . Psoriatic arthritis ([place])   . Snores     . Wears glasses     ALLERGIES:  is allergic to hydrocodone and oxycodone.  MEDICATIONS:  Current Outpatient Medications  Medication Sig Dispense Refill  . acetaminophen (TYLENOL) 500 MG tablet Take 500-1,000 mg by mouth 2 (two) times daily as needed for mild pain.    Marland Kitchen aspirin EC 81 MG tablet Take 81 mg by mouth at bedtime.     Marland Kitchen aspirin-sod bicarb-citric acid (ALKA-SELTZER) 325 MG TBEF tablet Take 650 mg by mouth daily as needed (indigestion).     Marland Kitchen dexamethasone (DECADRON) 4 MG tablet 4 mg by mouth twice a day the day before, day of and day after chemotherapy every 3 weeks. 40 tablet 1  . diphenhydramine-acetaminophen (TYLENOL PM) 25-500 MG TABS Take 1 tablet by mouth at bedtime. For sleep    . folic acid (FOLVITE) 1 MG tablet Take 1 tablet (1 mg total) by mouth daily. 30 tablet 4  . Homeopathic Products (LEG CRAMP COMPLEX PO) Take 2 tablets by mouth at bedtime as needed (leg cramp).     . lidocaine (XYLOCAINE) 2 % solution Use as directed 20 mLs in the mouth or throat every 3 (three) hours as needed for mouth pain. 100 mL 2  . lidocaine-prilocaine (EMLA) cream Apply 1 application topically as needed. (Patient taking differently: Apply 1 application topically as needed (port access). ) 30 g 0  . Lutein 10 MG TABS Take 10 mg by mouth at bedtime.     . magic mouthwash SOLN Take 5 mLs by mouth 4 (four) times  daily as needed for mouth pain. 240 mL 2  . Methylcobalamin (METHYL B-12 PO) Take 1 tablet by mouth daily.    . Multiple Vitamin (MULITIVITAMIN WITH MINERALS) TABS Take 1 tablet by mouth daily.    Marland Kitchen nystatin (MYCOSTATIN/NYSTOP) powder Apply topically 3 (three) times daily. Apply to groin rash. 15 g 0  . pantoprazole (PROTONIX) 40 MG tablet Take 40 mg by mouth daily.     . pravastatin (PRAVACHOL) 40 MG tablet Take 40 mg by mouth at bedtime.     . predniSONE (DELTASONE) 10 MG tablet Take 5 mg by mouth daily.     . prochlorperazine (COMPAZINE) 10 MG tablet Take 1 tablet (10 mg total) by  mouth every 6 (six) hours as needed for nausea or vomiting. 30 tablet 0  . tamsulosin (FLOMAX) 0.4 MG CAPS capsule TAKE (1) CAPSULE DAILY, START 4 DAYS BEFORE PROCEDURE (Patient taking differently: take 1 capsule by mouth every morning) 30 capsule 2  . triamcinolone (NASACORT) 55 MCG/ACT AERO nasal inhaler Place 1 spray into the nose at bedtime.      No current facility-administered medications for this visit.     SURGICAL HISTORY:  Past Surgical History:  Procedure Laterality Date  . COLONOSCOPY    . HEMORRHOID SURGERY N/A 10/01/2013   Procedure: EXAM UNDER ANESTHESIA  AND EXCISIONAL HEMORRHOIDECTOMY WITH HEMORRHOID BANDING X 2;  Surgeon: Gayland Curry, MD;  Location: [facility];  Service: General;  Laterality: N/A;  . INGUINAL HERNIA REPAIR  01/04/2012   Procedure: LAPAROSCOPIC INGUINAL HERNIA;  Surgeon: Gayland Curry, MD,FACS;  Location: [facility];  Service: General;  Laterality: Right;  . IR FLUORO GUIDE PORT INSERTION RIGHT  01/02/2017  . IR US GUIDE VASC ACCESS RIGHT  01/02/2017  . VIDEO BRONCHOSCOPY WITH ENDOBRONCHIAL ULTRASOUND N/A 12/31/2015   Procedure: VIDEO BRONCHOSCOPY WITH ENDOBRONCHIAL ULTRASOUND transbronchial biopsy of node 10 R lymph node;  Surgeon: Grace Isaac, MD;  Location: [facility];  Service: Thoracic;  Laterality: N/A;    REVIEW OF SYSTEMS:  A comprehensive review of systems was negative except for: Constitutional: positive for fatigue Ears, nose, mouth, throat, and face: positive for nasal congestion Musculoskeletal: positive for arthralgias   PHYSICAL EXAMINATION: General appearance: alert, cooperative, fatigued and no distress Head: Normocephalic, without obvious abnormality, atraumatic Neck: no adenopathy, no JVD, supple, symmetrical, trachea midline and thyroid not enlarged, symmetric, no tenderness/mass/nodules Lymph nodes: Cervical, supraclavicular, and axillary nodes normal. Resp: clear to auscultation bilaterally Back: symmetric, no curvature. ROM  normal. No CVA tenderness. Cardio: regular rate and rhythm, S1, S2 normal, no murmur, click, rub or gallop GI: soft, non-tender; bowel sounds normal; no masses,  no organomegaly Extremities: extremities normal, atraumatic, no cyanosis or edema  ECOG PERFORMANCE STATUS: 1 - Symptomatic but completely ambulatory  Blood pressure (!) 142/97, pulse 98, temperature 97.7 F (36.5 C), temperature source Oral, resp. rate 18, height _0  (1.854 m), weight 179 lb 3.2 oz (81.3 kg), SpO2 100 %.  LABORATORY DATA: Lab Results  Component Value Date   WBC 4.8 04/02/2017   HGB 10.6 (L) 04/02/2017   HCT 31.2 (L) 04/02/2017   MCV 97.1 04/02/2017   PLT 125 (L) 04/02/2017      Chemistry      Component Value Date/Time   NA 137 04/02/2017 0858   K 4.8 04/02/2017 0858   CL 103 01/02/2017 0731   CO2 18 (L) 04/02/2017 0858   BUN 19.1 04/02/2017 0858   CREATININE 1.3 04/02/2017 4166  Component Value Date/Time   CALCIUM 7.4 (L) 04/02/2017 0858   ALKPHOS 73 04/02/2017 0858   AST 27 04/02/2017 0858   ALT 24 04/02/2017 0858   BILITOT 0.41 04/02/2017 0858       RADIOGRAPHIC STUDIES: Ct Chest W Contrast  Result Date: 03/08/2017 CLINICAL DATA:  Patient with history of lung cancer. Follow-up evaluation. EXAM: CT CHEST, ABDOMEN, AND PELVIS WITH CONTRAST TECHNIQUE: Multidetector CT imaging of the chest, abdomen and pelvis was performed following the standard protocol during bolus administration of intravenous contrast. CONTRAST:  1100m ISOVUE-300 IOPAMIDOL (ISOVUE-300) INJECTION 61% COMPARISON:  PET-CT 12/23/2016. FINDINGS: CT CHEST FINDINGS Cardiovascular: Right anterior chest wall Port-A-Cath is present with tip terminating in the superior vena cava. Normal heart size. No pericardial effusion. Mediastinum/Nodes: No enlarged axillary, mediastinal or hilar lymphadenopathy. Normal esophagus. Interval decrease in size of right hilar mass measuring 1.3 x 1.0 cm (image 26; series 2), previously 2.8 x 1.8 cm.  Lungs/Pleura: Central airways are patent. Centrilobular and paraseptal emphysematous changes. Unchanged scarring within the left upper and left lower lobes. Grossly unchanged right paramediastinal post radiation changes. Similar-appearing 8 mm subpleural right middle lobe nodule (image 82; series 4). Stable 3 mm right lower lobe nodule (image 87; series 4). No pleural effusion or pneumothorax. Musculoskeletal: Thoracic spine degenerative changes. No aggressive or acute appearing osseous lesions. CT ABDOMEN PELVIS FINDINGS Hepatobiliary: Liver is normal in size and contour. No focal lesion identified. Gallbladder is unremarkable. No intrahepatic or extrahepatic biliary ductal dilatation. Pancreas: Unremarkable Spleen: Unremarkable Adrenals/Urinary Tract: Interval decrease in size of left adrenal nodule measuring 2.6 cm (image 65; series 2), previously 3.2 cm. Grossly unchanged 1.7 cm right adrenal nodule (image 59; series 2). Kidneys enhance symmetrically with contrast. Bilateral nephrolithiasis. Focal atrophy superior pole right kidney. Simple cyst inferior pole left kidney. Mild wall thickening in the urinary bladder, likely secondary to outlet obstruction. Stomach/Bowel: Descending and sigmoid colonic diverticulosis. Mild fat stranding about the sigmoid colon (image 107; series 2). Normal morphology of the stomach. No evidence for bowel obstruction. No free fluid or free intraperitoneal air. Vascular/Lymphatic: Infrarenal abdominal aortic ectasia. Peripheral calcified atherosclerotic plaque. No retroperitoneal lymphadenopathy. Marked narrowing at the origin of the superior mesenteric artery. Reproductive: Prostate is enlarged. Other: Bilateral fat containing inguinal hernias. Musculoskeletal: Bilateral hip joint degenerative changes. Lumbar spine degenerative changes. No aggressive or acute appearing osseous lesions. IMPRESSION: 1. Interval decrease in size of right hilar mass. 2. Interval decrease in size of left  adrenal nodule. Stable right adrenal nodule. 3. Mild fat stranding about a sigmoid colonic diverticuli suggestive of mild acute diverticulitis. These results will be called to the ordering clinician or representative by the Radiologist Assistant, and communication documented in the PACS or zVision Dashboard. Electronically Signed   By: DLovey NewcomerM.D.   On: 03/08/2017 17:21   Ct Abdomen Pelvis W Contrast  Result Date: 03/08/2017 CLINICAL DATA:  Patient with history of lung cancer. Follow-up evaluation. EXAM: CT CHEST, ABDOMEN, AND PELVIS WITH CONTRAST TECHNIQUE: Multidetector CT imaging of the chest, abdomen and pelvis was performed following the standard protocol during bolus administration of intravenous contrast. CONTRAST:  1091mISOVUE-300 IOPAMIDOL (ISOVUE-300) INJECTION 61% COMPARISON:  PET-CT 12/23/2016. FINDINGS: CT CHEST FINDINGS Cardiovascular: Right anterior chest wall Port-A-Cath is present with tip terminating in the superior vena cava. Normal heart size. No pericardial effusion. Mediastinum/Nodes: No enlarged axillary, mediastinal or hilar lymphadenopathy. Normal esophagus. Interval decrease in size of right hilar mass measuring 1.3 x 1.0 cm (image 26; series 2), previously 2.8 x  1.8 cm. Lungs/Pleura: Central airways are patent. Centrilobular and paraseptal emphysematous changes. Unchanged scarring within the left upper and left lower lobes. Grossly unchanged right paramediastinal post radiation changes. Similar-appearing 8 mm subpleural right middle lobe nodule (image 82; series 4). Stable 3 mm right lower lobe nodule (image 87; series 4). No pleural effusion or pneumothorax. Musculoskeletal: Thoracic spine degenerative changes. No aggressive or acute appearing osseous lesions. CT ABDOMEN PELVIS FINDINGS Hepatobiliary: Liver is normal in size and contour. No focal lesion identified. Gallbladder is unremarkable. No intrahepatic or extrahepatic biliary ductal dilatation. Pancreas: Unremarkable  Spleen: Unremarkable Adrenals/Urinary Tract: Interval decrease in size of left adrenal nodule measuring 2.6 cm (image 65; series 2), previously 3.2 cm. Grossly unchanged 1.7 cm right adrenal nodule (image 59; series 2). Kidneys enhance symmetrically with contrast. Bilateral nephrolithiasis. Focal atrophy superior pole right kidney. Simple cyst inferior pole left kidney. Mild wall thickening in the urinary bladder, likely secondary to outlet obstruction. Stomach/Bowel: Descending and sigmoid colonic diverticulosis. Mild fat stranding about the sigmoid colon (image 107; series 2). Normal morphology of the stomach. No evidence for bowel obstruction. No free fluid or free intraperitoneal air. Vascular/Lymphatic: Infrarenal abdominal aortic ectasia. Peripheral calcified atherosclerotic plaque. No retroperitoneal lymphadenopathy. Marked narrowing at the origin of the superior mesenteric artery. Reproductive: Prostate is enlarged. Other: Bilateral fat containing inguinal hernias. Musculoskeletal: Bilateral hip joint degenerative changes. Lumbar spine degenerative changes. No aggressive or acute appearing osseous lesions. IMPRESSION: 1. Interval decrease in size of right hilar mass. 2. Interval decrease in size of left adrenal nodule. Stable right adrenal nodule. 3. Mild fat stranding about a sigmoid colonic diverticuli suggestive of mild acute diverticulitis. These results will be called to the ordering clinician or representative by the Radiologist Assistant, and communication documented in the PACS or zVision Dashboard. Electronically Signed   By: Lovey Newcomer M.D.   On: 03/08/2017 17:21    ASSESSMENT AND PLAN:  This is a very pleasant 76 years old white male with unresectable a stage II a non-small cell lung cancer, adenocarcinoma presented with right hilar mass status post a course of concurrent chemoradiation with weekly carboplatin and paclitaxel. He has partial response to this treatment. The patient has been  observation for the last few months. The patient developed disease progression and he was started on systemic chemotherapy with carboplatin, Alimta and Avastin status post 4 cycles. He tolerated the last cycle of his treatment fairly well. I recommended for the patient to proceed with cycle #5 today as a scheduled. I will see him back for follow-up visit in 3 weeks for evaluation before starting cycle #6. For the fatigue, I advised the patient to continue on Decadron 4 mg p.o. twice daily for 3-4 more days after the treatment. He was advised to call immediately if he has any concerning symptoms in the interval. The patient voices understanding of current disease status and treatment options and is in agreement with the current care plan. All questions were answered. The patient knows to call the clinic with any problems, questions or concerns. We can certainly see the patient much sooner if necessary.  Disclaimer: This note was dictated with voice recognition software. Similar sounding words can inadvertently be transcribed and may not be corrected upon review.

## 2017-04-02 NOTE — Telephone Encounter (Signed)
No additional appt to add per 12/3 - appts already scheduled.

## 2017-04-11 ENCOUNTER — Ambulatory Visit (HOSPITAL_BASED_OUTPATIENT_CLINIC_OR_DEPARTMENT_OTHER): Payer: Medicare Other | Admitting: Oncology

## 2017-04-11 ENCOUNTER — Other Ambulatory Visit (HOSPITAL_BASED_OUTPATIENT_CLINIC_OR_DEPARTMENT_OTHER): Payer: Medicare Other

## 2017-04-11 ENCOUNTER — Encounter: Payer: Self-pay | Admitting: Oncology

## 2017-04-11 VITALS — BP 135/81 | HR 112 | Temp 97.6°F | Resp 18 | Wt 175.5 lb

## 2017-04-11 DIAGNOSIS — C3401 Malignant neoplasm of right main bronchus: Secondary | ICD-10-CM

## 2017-04-11 DIAGNOSIS — C3491 Malignant neoplasm of unspecified part of right bronchus or lung: Secondary | ICD-10-CM

## 2017-04-11 DIAGNOSIS — Z5111 Encounter for antineoplastic chemotherapy: Secondary | ICD-10-CM

## 2017-04-11 DIAGNOSIS — F419 Anxiety disorder, unspecified: Secondary | ICD-10-CM | POA: Diagnosis not present

## 2017-04-11 DIAGNOSIS — G4700 Insomnia, unspecified: Secondary | ICD-10-CM

## 2017-04-11 DIAGNOSIS — D6959 Other secondary thrombocytopenia: Secondary | ICD-10-CM | POA: Diagnosis not present

## 2017-04-11 DIAGNOSIS — R531 Weakness: Secondary | ICD-10-CM | POA: Diagnosis not present

## 2017-04-11 DIAGNOSIS — R530 Neoplastic (malignant) related fatigue: Secondary | ICD-10-CM

## 2017-04-11 DIAGNOSIS — R0989 Other specified symptoms and signs involving the circulatory and respiratory systems: Secondary | ICD-10-CM | POA: Diagnosis not present

## 2017-04-11 LAB — COMPREHENSIVE METABOLIC PANEL
ALBUMIN: 3.6 g/dL (ref 3.5–5.0)
ALT: 26 U/L (ref 0–55)
ANION GAP: 14 meq/L — AB (ref 3–11)
AST: 30 U/L (ref 5–34)
Alkaline Phosphatase: 79 U/L (ref 40–150)
BUN: 20.7 mg/dL (ref 7.0–26.0)
CALCIUM: 7.6 mg/dL — AB (ref 8.4–10.4)
CO2: 20 mEq/L — ABNORMAL LOW (ref 22–29)
Chloride: 103 mEq/L (ref 98–109)
Creatinine: 1.2 mg/dL (ref 0.7–1.3)
EGFR: 57 mL/min/{1.73_m2} — AB (ref >60)
Glucose: 162 mg/dl — ABNORMAL HIGH (ref 70–140)
POTASSIUM: 4.3 meq/L (ref 3.5–5.1)
SODIUM: 137 meq/L (ref 136–145)
Total Bilirubin: 0.62 mg/dL (ref 0.20–1.20)
Total Protein: 7.4 g/dL (ref 6.4–8.3)

## 2017-04-11 LAB — CBC WITH DIFFERENTIAL/PLATELET
BASO%: 0 % (ref 0.0–2.0)
Basophils Absolute: 0 10*3/uL (ref 0.0–0.1)
EOS%: 0.7 % (ref 0.0–7.0)
Eosinophils Absolute: 0 10*3/uL (ref 0.0–0.5)
HCT: 32.5 % — ABNORMAL LOW (ref 38.4–49.9)
HEMOGLOBIN: 11.3 g/dL — AB (ref 13.0–17.1)
LYMPH%: 21.4 % (ref 14.0–49.0)
MCH: 33.8 pg — AB (ref 27.2–33.4)
MCHC: 34.8 g/dL (ref 32.0–36.0)
MCV: 97.3 fL (ref 79.3–98.0)
MONO#: 0.5 10*3/uL (ref 0.1–0.9)
MONO%: 11.4 % (ref 0.0–14.0)
NEUT#: 2.9 10*3/uL (ref 1.5–6.5)
NEUT%: 66.5 % (ref 39.0–75.0)
Platelets: 58 10*3/uL — ABNORMAL LOW (ref 140–400)
RBC: 3.34 10*6/uL — ABNORMAL LOW (ref 4.20–5.82)
RDW: 19.1 % — AB (ref 11.0–14.6)
WBC: 4.4 10*3/uL (ref 4.0–10.3)
lymph#: 0.9 10*3/uL (ref 0.9–3.3)

## 2017-04-11 MED ORDER — LORAZEPAM 0.5 MG PO TABS: 0.5000 mg | ORAL_TABLET | Freq: Two times a day (BID) | ORAL | 0 refills | Status: DC | PRN

## 2017-04-11 NOTE — Assessment & Plan Note (Signed)
This is a very pleasant 76 year old white male with unresectable a stage II a non-small cell lung cancer, adenocarcinoma presented with right hilar mass status post a course of concurrent chemoradiation with weekly carboplatin and paclitaxel. He has partial response to this treatment. The patient was placed on observation for rule months. The patient developed disease progression and he was started on systemic chemotherapy with carboplatin, Alimta and Avastin status post 5 cycles. He reports having more fatigue and weakness with each cycle of chemotherapy.  He continues to have a runny nose and runny eyes likely due to his chemotherapy.  He is now also reporting increased anxiety and difficulty sleeping.  Labs were reviewed with the patient and his wife.  Explained to him that he has thrombocytopenia due to his chemotherapy.  He is to monitor for any bleeding and contact us immediately.  He will continue to have weekly labs.  For the fatigue, he did not notice much of an improvement with the extra days of dexamethasone.  He does report that when he restarted prednisone by his rheumatologist he noted some improvement.  The patient states that he should be taking prednisone twice a day, but his wife states that she is only been putting 5 mg of prednisone daily into his pillbox.  I have encouraged patient to contact his rheumatologist to verify his dose.  For the runny nose and runny eyes, recommend that he use Zyrtec or Claritin and continue on the steroid nasal spray.  For the anxiety and difficulty sleeping, he was given of her discussion for Ativan 0.5 mg twice a day as needed.  Side effects were reviewed with the patient and his wife.  The patient will return in 2 weeks for evaluation prior to cycle #6 of his chemotherapy.  He was advised to call immediately if he has any concerning symptoms in the interval. The patient voices understanding of current disease status and treatment options and is in  agreement with the current care plan. All questions were answered. The patient knows to call the clinic with any problems, questions or concerns. We can certainly see the patient much sooner if necessary.

## 2017-04-11 NOTE — Progress Notes (Signed)
[facility] OFFICE PROGRESS NOTE  Nyland, Leonard, MD [address] [place] [address]  DIAGNOSIS: Metastatic non-small cell lung cancer, adenocarcinoma initially diagnosed as Unresectable stage IIA (T1a, N1, M0) non-small cell lung cancer, adenocarcinoma presented with a right hilar mass and right hilar adenopathy diagnosed in September 2017. Guardant 360: Negative for EGFR, ALK, ROS 1 and BRAF mutations.  PRIOR THERAPY: Status post concurrent chemoradiation with weekly carboplatin for AUC of 2 and paclitaxel 45 MG/M2 for 7 weeks.  CURRENT THERAPY: Systemic chemotherapy with carboplatin for AUC of 5, Alimta 500 MG/M2 and Avastin 15 MG/KG every 3 weeks. First dose 01/08/2017. Status post 5 cycles.  INTERVAL HISTORY: Mark Howell 76 y.o. male returns for routine follow-up visit accompanied by his wife.  The patient received his fifth cycle of chemotherapy last week.  The patient continues to complain of ongoing fatigue and weakness.  He also continues to report running eyes and nose.  He uses Nasacort and Zyrtec for this without much improvement.  The patient was instructed to take his dexamethasone for several days extra following his last cycle of chemotherapy to help with the fatigue.  He reports that he did not notice much difference.  The patient denies fevers and chills.  Denies chest pain, shortness of breath, cough, hemoptysis.  Denies nausea, vomiting, constipation, diarrhea.  The patient does report that he feels more shaky and anxious at times and has some difficulty sleeping.  The patient is here for repeat evaluation and lab work.  MEDICAL HISTORY: Past Medical History:  Diagnosis Date  . Arthritis   . Complication of anesthesia    problems voiding post op  . Full dentures   . GERD (gastroesophageal reflux disease)   . History of radiation therapy 01/25/16-02/28/16   right lung 45 Gy  . History of radiation therapy 04/17/16-05/02/16   right lung boost 20 Gy  in 10 fractions cumulative dose 65 gray  . HOH (hard of hearing)   . HTN (hypertension)   . Hyperlipidemia   . Lung mass 12/30/2015  . Odynophagia 04/06/2016  . Psoriatic arthritis (HCC)   . Snores   . Wears glasses     ALLERGIES:  is allergic to hydrocodone and oxycodone.  MEDICATIONS:  Current Outpatient Medications  Medication Sig Dispense Refill  . acetaminophen (TYLENOL) 500 MG tablet Take 500-1,000 mg by mouth 2 (two) times daily as needed for mild pain.    . aspirin EC 81 MG tablet Take 81 mg by mouth at bedtime.     . aspirin-sod bicarb-citric acid (ALKA-SELTZER) 325 MG TBEF tablet Take 650 mg by mouth daily as needed (indigestion).     . dexamethasone (DECADRON) 4 MG tablet 4 mg by mouth twice a day the day before, day of and day after chemotherapy every 3 weeks. 40 tablet 1  . diphenhydramine-acetaminophen (TYLENOL PM) 25-500 MG TABS Take 1 tablet by mouth at bedtime. For sleep    . folic acid (FOLVITE) 1 MG tablet Take 1 tablet (1 mg total) by mouth daily. 30 tablet 4  . Homeopathic Products (LEG CRAMP COMPLEX PO) Take 2 tablets by mouth at bedtime as needed (leg cramp).     . lidocaine (XYLOCAINE) 2 % solution Use as directed 20 mLs in the mouth or throat every 3 (three) hours as needed for mouth pain. 100 mL 2  . lidocaine-prilocaine (EMLA) cream Apply 1 application topically as needed. (Patient taking differently: Apply 1 application topically as needed (port access). ) 30 g   0  . LORazepam (ATIVAN) 0.5 MG tablet Take 1 tablet (0.5 mg total) by mouth 2 (two) times daily as needed for anxiety. 30 tablet 0  . Lutein 10 MG TABS Take 10 mg by mouth at bedtime.     . magic mouthwash SOLN Take 5 mLs by mouth 4 (four) times daily as needed for mouth pain. 240 mL 2  . Methylcobalamin (METHYL B-12 PO) Take 1 tablet by mouth daily.    . Multiple Vitamin (MULITIVITAMIN WITH MINERALS) TABS Take 1 tablet by mouth daily.    . nystatin (MYCOSTATIN/NYSTOP) powder Apply topically 3 (three)  times daily. Apply to groin rash. 15 g 0  . pantoprazole (PROTONIX) 40 MG tablet Take 40 mg by mouth daily.     . pravastatin (PRAVACHOL) 40 MG tablet Take 40 mg by mouth at bedtime.     . predniSONE (DELTASONE) 10 MG tablet Take 5 mg by mouth daily.     . prochlorperazine (COMPAZINE) 10 MG tablet Take 1 tablet (10 mg total) by mouth every 6 (six) hours as needed for nausea or vomiting. 30 tablet 0  . tamsulosin (FLOMAX) 0.4 MG CAPS capsule TAKE (1) CAPSULE DAILY, START 4 DAYS BEFORE PROCEDURE (Patient taking differently: take 1 capsule by mouth every morning) 30 capsule 2  . triamcinolone (NASACORT) 55 MCG/ACT AERO nasal inhaler Place 1 spray into the nose at bedtime.      No current facility-administered medications for this visit.     SURGICAL HISTORY:  Past Surgical History:  Procedure Laterality Date  . COLONOSCOPY    . HEMORRHOID SURGERY N/A 10/01/2013   Procedure: EXAM UNDER ANESTHESIA  AND EXCISIONAL HEMORRHOIDECTOMY WITH HEMORRHOID BANDING X 2;  Surgeon: Eric M Wilson, MD;  Location: [facility];  Service: General;  Laterality: N/A;  . INGUINAL HERNIA REPAIR  01/04/2012   Procedure: LAPAROSCOPIC INGUINAL HERNIA;  Surgeon: Eric M Wilson, MD,FACS;  Location: [facility];  Service: General;  Laterality: Right;  . IR FLUORO GUIDE PORT INSERTION RIGHT  01/02/2017  . IR US GUIDE VASC ACCESS RIGHT  01/02/2017  . VIDEO BRONCHOSCOPY WITH ENDOBRONCHIAL ULTRASOUND N/A 12/31/2015   Procedure: VIDEO BRONCHOSCOPY WITH ENDOBRONCHIAL ULTRASOUND transbronchial biopsy of node 10 R lymph node;  Surgeon: Edward B Gerhardt, MD;  Location: [facility];  Service: Thoracic;  Laterality: N/A;    REVIEW OF SYSTEMS:   Review of Systems  Constitutional: Negative for appetite change, chills, fever and unexpected weight change. Positive for fatigue. HENT:   Negative for mouth sores, nosebleeds, sore throat and trouble swallowing.  Positive for runny nose. Eyes: Negative for eye problems and icterus. Positive for  runny eyes. Respiratory: Negative for cough, hemoptysis, shortness of breath and wheezing.   Cardiovascular: Negative for chest pain and leg swelling.  Gastrointestinal: Negative for abdominal pain, constipation, diarrhea, nausea and vomiting.  Genitourinary: Negative for bladder incontinence, difficulty urinating, dysuria, frequency and hematuria.   Musculoskeletal: Negative for back pain, gait problem, neck pain and neck stiffness.  Skin: Negative for itching and rash.  Neurological: Negative for dizziness, extremity weakness, gait problem, headaches, light-headedness and seizures.  Hematological: Negative for adenopathy. Does not bruise/bleed easily.  Psychiatric/Behavioral: Negative for confusion, depression and sleep disturbance.  Positive for anxiety and difficulty sleeping.     PHYSICAL EXAMINATION:  Blood pressure 135/81, pulse (!) 112, temperature 97.6 F (36.4 C), temperature source Oral, resp. rate 18, weight 175 lb 8 oz (79.6 kg), SpO2 100 %.  ECOG PERFORMANCE STATUS: 1 - Symptomatic but completely ambulatory    Physical Exam  Constitutional: Oriented to person, place, and time and well-developed, well-nourished, and in no distress. No distress.  HENT:  Head: Normocephalic and atraumatic.  Mouth/Throat: Oropharynx is clear and moist. No oropharyngeal exudate.  Eyes: Conjunctivae are normal. Right eye exhibits no discharge. Left eye exhibits no discharge. No scleral icterus.  Neck: Normal range of motion. Neck supple.  Cardiovascular: Normal rate, regular rhythm, normal heart sounds and intact distal pulses.   Pulmonary/Chest: Effort normal and breath sounds normal. No respiratory distress. No wheezes. No rales.  Abdominal: Soft. Bowel sounds are normal. Exhibits no distension and no mass. There is no tenderness.  Musculoskeletal: Normal range of motion. Exhibits no edema.  Lymphadenopathy:    No cervical adenopathy.  Neurological: Alert and oriented to person, place, and  time. Exhibits normal muscle tone. Gait normal. Coordination normal.  Skin: Skin is warm and dry. No rash noted. Not diaphoretic. No erythema. No pallor.  Psychiatric: Mood, memory and judgment normal.  Vitals reviewed.  LABORATORY DATA: Lab Results  Component Value Date   WBC 4.4 04/11/2017   HGB 11.3 (L) 04/11/2017   HCT 32.5 (L) 04/11/2017   MCV 97.3 04/11/2017   PLT 58 (L) 04/11/2017      Chemistry      Component Value Date/Time   NA 137 04/11/2017 0813   K 4.3 04/11/2017 0813   CL 103 01/02/2017 0731   CO2 20 (L) 04/11/2017 0813   BUN 20.7 04/11/2017 0813   CREATININE 1.2 04/11/2017 0813      Component Value Date/Time   CALCIUM 7.6 (L) 04/11/2017 0813   ALKPHOS 79 04/11/2017 0813   AST 30 04/11/2017 0813   ALT 26 04/11/2017 0813   BILITOT 0.62 04/11/2017 0813       RADIOGRAPHIC STUDIES:  No results found.   ASSESSMENT/PLAN:  Adenocarcinoma of right lung, stage 4 (HCC) This is a very pleasant 76 year old white male with unresectable a stage II a non-small cell lung cancer, adenocarcinoma presented with right hilar mass status post a course of concurrent chemoradiation with weekly carboplatin and paclitaxel. He has partial response to this treatment. The patient was placed on observation for rule months. The patient developed disease progression and he was started on systemic chemotherapy with carboplatin, Alimta and Avastin status post 5 cycles. He reports having more fatigue and weakness with each cycle of chemotherapy.  He continues to have a runny nose and runny eyes likely due to his chemotherapy.  He is now also reporting increased anxiety and difficulty sleeping.  Labs were reviewed with the patient and his wife.  Explained to him that he has thrombocytopenia due to his chemotherapy.  He is to monitor for any bleeding and contact us immediately.  He will continue to have weekly labs.  For the fatigue, he did not notice much of an improvement with the extra  days of dexamethasone.  He does report that when he restarted prednisone by his rheumatologist he noted some improvement.  The patient states that he should be taking prednisone twice a day, but his wife states that she is only been putting 5 mg of prednisone daily into his pillbox.  I have encouraged patient to contact his rheumatologist to verify his dose.  For the runny nose and runny eyes, recommend that he use Zyrtec or Claritin and continue on the steroid nasal spray.  For the anxiety and difficulty sleeping, he was given of her discussion for Ativan 0.5 mg twice a day as needed.    Side effects were reviewed with the patient and his wife.  The patient will return in 2 weeks for evaluation prior to cycle #6 of his chemotherapy.  He was advised to call immediately if he has any concerning symptoms in the interval. The patient voices understanding of current disease status and treatment options and is in agreement with the current care plan. All questions were answered. The patient knows to call the clinic with any problems, questions or concerns. We can certainly see the patient much sooner if necessary.  No orders of the defined types were placed in this encounter.   Mikey Bussing, DNP, AGPCNP-BC, AOCNP 04/11/17

## 2017-04-17 ENCOUNTER — Telehealth: Payer: Self-pay | Admitting: Oncology

## 2017-04-17 NOTE — Telephone Encounter (Signed)
Per los already scheduled

## 2017-04-18 ENCOUNTER — Other Ambulatory Visit (HOSPITAL_BASED_OUTPATIENT_CLINIC_OR_DEPARTMENT_OTHER): Payer: Medicare Other

## 2017-04-18 DIAGNOSIS — Z5111 Encounter for antineoplastic chemotherapy: Secondary | ICD-10-CM

## 2017-04-18 DIAGNOSIS — C3401 Malignant neoplasm of right main bronchus: Secondary | ICD-10-CM

## 2017-04-18 DIAGNOSIS — C3491 Malignant neoplasm of unspecified part of right bronchus or lung: Secondary | ICD-10-CM

## 2017-04-18 LAB — CBC WITH DIFFERENTIAL/PLATELET
BASO%: 0.2 % (ref 0.0–2.0)
Basophils Absolute: 0 10*3/uL (ref 0.0–0.1)
EOS%: 0.1 % (ref 0.0–7.0)
Eosinophils Absolute: 0 10*3/uL (ref 0.0–0.5)
HCT: 30 % — ABNORMAL LOW (ref 38.4–49.9)
HGB: 10.1 g/dL — ABNORMAL LOW (ref 13.0–17.1)
LYMPH%: 9.6 % — AB (ref 14.0–49.0)
MCH: 33.7 pg — ABNORMAL HIGH (ref 27.2–33.4)
MCHC: 33.6 g/dL (ref 32.0–36.0)
MCV: 100.5 fL — ABNORMAL HIGH (ref 79.3–98.0)
MONO#: 0.8 10*3/uL (ref 0.1–0.9)
MONO%: 13.8 % (ref 0.0–14.0)
NEUT#: 4.5 10*3/uL (ref 1.5–6.5)
NEUT%: 76.3 % — AB (ref 39.0–75.0)
Platelets: 55 10*3/uL — ABNORMAL LOW (ref 140–400)
RBC: 2.98 10*6/uL — AB (ref 4.20–5.82)
RDW: 21.3 % — ABNORMAL HIGH (ref 11.0–14.6)
WBC: 6 10*3/uL (ref 4.0–10.3)
lymph#: 0.6 10*3/uL — ABNORMAL LOW (ref 0.9–3.3)

## 2017-04-18 LAB — COMPREHENSIVE METABOLIC PANEL
ALT: 37 U/L (ref 0–55)
AST: 36 U/L — AB (ref 5–34)
Albumin: 3.8 g/dL (ref 3.5–5.0)
Alkaline Phosphatase: 83 U/L (ref 40–150)
Anion Gap: 15 mEq/L — ABNORMAL HIGH (ref 3–11)
BUN: 19.7 mg/dL (ref 7.0–26.0)
CHLORIDE: 101 meq/L (ref 98–109)
CO2: 21 meq/L — AB (ref 22–29)
CREATININE: 1.4 mg/dL — AB (ref 0.7–1.3)
Calcium: 7.3 mg/dL — ABNORMAL LOW (ref 8.4–10.4)
EGFR: 50 mL/min/{1.73_m2} — ABNORMAL LOW (ref >60)
GLUCOSE: 197 mg/dL — AB (ref 70–140)
Potassium: 4.3 mEq/L (ref 3.5–5.1)
SODIUM: 137 meq/L (ref 136–145)
Total Bilirubin: 0.55 mg/dL (ref 0.20–1.20)
Total Protein: 7.6 g/dL (ref 6.4–8.3)

## 2017-04-23 ENCOUNTER — Ambulatory Visit: Payer: Medicare Other

## 2017-04-23 ENCOUNTER — Other Ambulatory Visit (HOSPITAL_BASED_OUTPATIENT_CLINIC_OR_DEPARTMENT_OTHER): Payer: Medicare Other

## 2017-04-23 ENCOUNTER — Encounter: Payer: Self-pay | Admitting: Internal Medicine

## 2017-04-23 ENCOUNTER — Ambulatory Visit (HOSPITAL_BASED_OUTPATIENT_CLINIC_OR_DEPARTMENT_OTHER): Payer: Medicare Other | Admitting: Internal Medicine

## 2017-04-23 ENCOUNTER — Telehealth: Payer: Self-pay | Admitting: Internal Medicine

## 2017-04-23 VITALS — BP 140/99 | HR 110 | Temp 97.6°F | Resp 20 | Ht 73.0 in | Wt 180.5 lb

## 2017-04-23 DIAGNOSIS — Z95828 Presence of other vascular implants and grafts: Secondary | ICD-10-CM

## 2017-04-23 DIAGNOSIS — R5383 Other fatigue: Secondary | ICD-10-CM | POA: Diagnosis not present

## 2017-04-23 DIAGNOSIS — C3491 Malignant neoplasm of unspecified part of right bronchus or lung: Secondary | ICD-10-CM

## 2017-04-23 DIAGNOSIS — C3401 Malignant neoplasm of right main bronchus: Secondary | ICD-10-CM

## 2017-04-23 DIAGNOSIS — Z5111 Encounter for antineoplastic chemotherapy: Secondary | ICD-10-CM

## 2017-04-23 DIAGNOSIS — C3490 Malignant neoplasm of unspecified part of unspecified bronchus or lung: Secondary | ICD-10-CM

## 2017-04-23 LAB — CBC WITH DIFFERENTIAL/PLATELET
BASO%: 0.9 % (ref 0.0–2.0)
Basophils Absolute: 0 10*3/uL (ref 0.0–0.1)
EOS%: 0 % (ref 0.0–7.0)
Eosinophils Absolute: 0 10*3/uL (ref 0.0–0.5)
HEMATOCRIT: 29.4 % — AB (ref 38.4–49.9)
HEMOGLOBIN: 10.1 g/dL — AB (ref 13.0–17.1)
LYMPH#: 0.5 10*3/uL — AB (ref 0.9–3.3)
LYMPH%: 10.2 % — ABNORMAL LOW (ref 14.0–49.0)
MCH: 34.9 pg — ABNORMAL HIGH (ref 27.2–33.4)
MCHC: 34.3 g/dL (ref 32.0–36.0)
MCV: 101.8 fL — ABNORMAL HIGH (ref 79.3–98.0)
MONO#: 0.3 10*3/uL (ref 0.1–0.9)
MONO%: 5.4 % (ref 0.0–14.0)
NEUT%: 83.5 % — ABNORMAL HIGH (ref 39.0–75.0)
NEUTROS ABS: 4.2 10*3/uL (ref 1.5–6.5)
Platelets: 80 10*3/uL — ABNORMAL LOW (ref 140–400)
RBC: 2.88 10*6/uL — ABNORMAL LOW (ref 4.20–5.82)
RDW: 21 % — AB (ref 11.0–14.6)
WBC: 5 10*3/uL (ref 4.0–10.3)

## 2017-04-23 LAB — COMPREHENSIVE METABOLIC PANEL
ALBUMIN: 3.7 g/dL (ref 3.5–5.0)
ALK PHOS: 88 U/L (ref 40–150)
ALT: 22 U/L (ref 0–55)
AST: 24 U/L (ref 5–34)
Anion Gap: 18 mEq/L — ABNORMAL HIGH (ref 3–11)
BILIRUBIN TOTAL: 0.5 mg/dL (ref 0.20–1.20)
BUN: 22.2 mg/dL (ref 7.0–26.0)
CO2: 17 mEq/L — ABNORMAL LOW (ref 22–29)
CREATININE: 1.3 mg/dL (ref 0.7–1.3)
Calcium: 6.7 mg/dL — ABNORMAL LOW (ref 8.4–10.4)
Chloride: 102 mEq/L (ref 98–109)
EGFR: 53 mL/min/{1.73_m2} — ABNORMAL LOW (ref >60)
GLUCOSE: 293 mg/dL — AB (ref 70–140)
Potassium: 4.8 mEq/L (ref 3.5–5.1)
SODIUM: 137 meq/L (ref 136–145)
TOTAL PROTEIN: 7.5 g/dL (ref 6.4–8.3)

## 2017-04-23 MED ORDER — SODIUM CHLORIDE 0.9% FLUSH
10.0000 mL | Freq: Once | INTRAVENOUS | Status: AC
  Administered 2017-04-23: 10 mL
  Filled 2017-04-23: qty 10

## 2017-04-23 NOTE — Telephone Encounter (Signed)
Gave avs and calendar for December and January  °

## 2017-04-23 NOTE — Progress Notes (Signed)
[facility] [phone]   [phone]  OFFICE PROGRESS NOTE  [name], MD [place] [address]  DIAGNOSIS: Metastatic non-small cell lung cancer, adenocarcinoma initially diagnosed as Unresectable stage IIA (T1a, N1, M0) non-small cell lung cancer, adenocarcinoma presented with a right hilar mass and right hilar adenopathy diagnosed in September 2017. Guardant 360: Negative for EGFR, ALK, ROS 1 and BRAF mutations.  PRIOR THERAPY: Status post concurrent chemoradiation with weekly carboplatin for AUC of 2 and paclitaxel 45 MG/M2 for 7 weeks.  CURRENT THERAPY: Systemic chemotherapy with carboplatin for AUC of 5, Alimta 500 MG/M2 and Avastin 15 MG/KG every 3 weeks. First dose 01/08/2017. Status post 5 cycles.  INTERVAL HISTORY: Mark Howell 76 y.o. male returns to the clinic today for follow-up visit accompanied by his wife.  The patient is feeling fine today with no specific complaints except for the generalized fatigue and watery eyes and runny nose.  He also has lack of appetite but he gained 4 pounds since his last visit.  He denied having any current chest pain but has shortness of breath with exertion with no cough or hemoptysis.  He denied having any fever or chills.  He has no nausea, vomiting, diarrhea or constipation.  The patient is here today for evaluation before starting cycle #6 of his chemotherapy.   MEDICAL HISTORY: Past Medical History:  Diagnosis Date  . Arthritis   . Complication of anesthesia    problems voiding post op  . Full dentures   . GERD (gastroesophageal reflux disease)   . History of radiation therapy 01/25/16-02/28/16   right lung 45 Gy  . History of radiation therapy 04/17/16-05/02/16   right lung boost 20 Gy in 10 fractions cumulative dose 65 gray  . HOH (hard of hearing)   . HTN (hypertension)   . Hyperlipidemia   . Lung mass 12/30/2015  . Odynophagia 04/06/2016  . Psoriatic arthritis  ([place])   . Snores   . Wears glasses     ALLERGIES:  is allergic to hydrocodone and oxycodone.  MEDICATIONS:  Current Outpatient Medications  Medication Sig Dispense Refill  . acetaminophen (TYLENOL) 500 MG tablet Take 500-1,000 mg by mouth 2 (two) times daily as needed for mild pain.    Marland Kitchen aspirin EC 81 MG tablet Take 81 mg by mouth at bedtime.     Marland Kitchen aspirin-sod bicarb-citric acid (ALKA-SELTZER) 325 MG TBEF tablet Take 650 mg by mouth daily as needed (indigestion).     Marland Kitchen dexamethasone (DECADRON) 4 MG tablet 4 mg by mouth twice a day the day before, day of and day after chemotherapy every 3 weeks. 40 tablet 1  . diphenhydramine-acetaminophen (TYLENOL PM) 25-500 MG TABS Take 1 tablet by mouth at bedtime. For sleep    . folic acid (FOLVITE) 1 MG tablet Take 1 tablet (1 mg total) by mouth daily. 30 tablet 4  . Homeopathic Products (LEG CRAMP COMPLEX PO) Take 2 tablets by mouth at bedtime as needed (leg cramp).     . lidocaine (XYLOCAINE) 2 % solution Use as directed 20 mLs in the mouth or throat every 3 (three) hours as needed for mouth pain. 100 mL 2  . lidocaine-prilocaine (EMLA) cream Apply 1 application topically as needed. (Patient taking differently: Apply 1 application topically as needed (port access). ) 30 g 0  . LORazepam (ATIVAN) 0.5 MG tablet Take 1 tablet (0.5 mg total) by mouth 2 (two) times daily as needed for anxiety.  30 tablet 0  . Lutein 10 MG TABS Take 10 mg by mouth at bedtime.     . magic mouthwash SOLN Take 5 mLs by mouth 4 (four) times daily as needed for mouth pain. 240 mL 2  . Methylcobalamin (METHYL B-12 PO) Take 1 tablet by mouth daily.    . Multiple Vitamin (MULITIVITAMIN WITH MINERALS) TABS Take 1 tablet by mouth daily.    Marland Kitchen nystatin (MYCOSTATIN/NYSTOP) powder Apply topically 3 (three) times daily. Apply to groin rash. 15 g 0  . pantoprazole (PROTONIX) 40 MG tablet Take 40 mg by mouth daily.     . pravastatin (PRAVACHOL) 40 MG tablet Take 40 mg by mouth at bedtime.      . predniSONE (DELTASONE) 10 MG tablet Take 5 mg by mouth daily.     . prochlorperazine (COMPAZINE) 10 MG tablet Take 1 tablet (10 mg total) by mouth every 6 (six) hours as needed for nausea or vomiting. 30 tablet 0  . tamsulosin (FLOMAX) 0.4 MG CAPS capsule TAKE (1) CAPSULE DAILY, START 4 DAYS BEFORE PROCEDURE (Patient taking differently: take 1 capsule by mouth every morning) 30 capsule 2  . triamcinolone (NASACORT) 55 MCG/ACT AERO nasal inhaler Place 1 spray into the nose at bedtime.      No current facility-administered medications for this visit.     SURGICAL HISTORY:  Past Surgical History:  Procedure Laterality Date  . COLONOSCOPY    . HEMORRHOID SURGERY N/A 10/01/2013   Procedure: EXAM UNDER ANESTHESIA  AND EXCISIONAL HEMORRHOIDECTOMY WITH HEMORRHOID BANDING X 2;  Surgeon: Gayland Curry, MD;  Location: [facility];  Service: General;  Laterality: N/A;  . INGUINAL HERNIA REPAIR  01/04/2012   Procedure: LAPAROSCOPIC INGUINAL HERNIA;  Surgeon: Gayland Curry, MD,FACS;  Location: [facility];  Service: General;  Laterality: Right;  . IR FLUORO GUIDE PORT INSERTION RIGHT  01/02/2017  . IR US GUIDE VASC ACCESS RIGHT  01/02/2017  . VIDEO BRONCHOSCOPY WITH ENDOBRONCHIAL ULTRASOUND N/A 12/31/2015   Procedure: VIDEO BRONCHOSCOPY WITH ENDOBRONCHIAL ULTRASOUND transbronchial biopsy of node 10 R lymph node;  Surgeon: Grace Isaac, MD;  Location: [facility];  Service: Thoracic;  Laterality: N/A;    REVIEW OF SYSTEMS:  A comprehensive review of systems was negative except for: Constitutional: positive for fatigue Ears, nose, mouth, throat, and face: positive for nasal congestion Musculoskeletal: positive for arthralgias   PHYSICAL EXAMINATION: General appearance: alert, cooperative, fatigued and no distress Head: Normocephalic, without obvious abnormality, atraumatic Neck: no adenopathy, no JVD, supple, symmetrical, trachea midline and thyroid not enlarged, symmetric, no  tenderness/mass/nodules Lymph nodes: Cervical, supraclavicular, and axillary nodes normal. Resp: clear to auscultation bilaterally Back: symmetric, no curvature. ROM normal. No CVA tenderness. Cardio: regular rate and rhythm, S1, S2 normal, no murmur, click, rub or gallop GI: soft, non-tender; bowel sounds normal; no masses,  no organomegaly Extremities: extremities normal, atraumatic, no cyanosis or edema  ECOG PERFORMANCE STATUS: 1 - Symptomatic but completely ambulatory  Blood pressure (!) 140/99, pulse (!) 110, temperature 97.6 F (36.4 C), temperature source Oral, resp. rate 20, height _0  (1.854 m), weight 180 lb 8 oz (81.9 kg), SpO2 100 %.  LABORATORY DATA: Lab Results  Component Value Date   WBC 6.0 04/18/2017   HGB 10.1 (L) 04/18/2017   HCT 30.0 (L) 04/18/2017   MCV 100.5 (H) 04/18/2017   PLT 55 (L) 04/18/2017      Chemistry      Component Value Date/Time   NA 137 04/18/2017 1048  K 4.3 04/18/2017 1048   CL 103 01/02/2017 0731   CO2 21 (L) 04/18/2017 1048   BUN 19.7 04/18/2017 1048   CREATININE 1.4 (H) 04/18/2017 1048      Component Value Date/Time   CALCIUM 7.3 (L) 04/18/2017 1048   ALKPHOS 83 04/18/2017 1048   AST 36 (H) 04/18/2017 1048   ALT 37 04/18/2017 1048   BILITOT 0.55 04/18/2017 1048       RADIOGRAPHIC STUDIES: No results found.  ASSESSMENT AND PLAN:  This is a very pleasant 76 years old white male with unresectable a stage II a non-small cell lung cancer, adenocarcinoma presented with right hilar mass status post a course of concurrent chemoradiation with weekly carboplatin and paclitaxel. He has partial response to this treatment. The patient has been observation for the last few months. The patient developed disease progression and he was started on systemic chemotherapy with carboplatin, Alimta and Avastin status post 5 cycles. The patient continues to tolerate this treatment fairly well but the platelets count are low today. I will delay  the start of cycle number 6 by 1 week until improvement of his platelets count. I will see him back for follow-up visit in 1 month for evaluation after repeating CT scan of the chest, abdomen and pelvis for restaging of his disease. For the fatigue, I advised the patient to continue on Decadron 4 mg p.o. twice daily for 3-4 more days after the treatment. The patient was advised to call immediately if he has any concerning symptoms in the interval. The patient voices understanding of current disease status and treatment options and is in agreement with the current care plan. All questions were answered. The patient knows to call the clinic with any problems, questions or concerns. We can certainly see the patient much sooner if necessary.  Disclaimer: This note was dictated with voice recognition software. Similar sounding words can inadvertently be transcribed and may not be corrected upon review.

## 2017-04-30 ENCOUNTER — Ambulatory Visit (HOSPITAL_BASED_OUTPATIENT_CLINIC_OR_DEPARTMENT_OTHER): Payer: Medicare Other

## 2017-04-30 ENCOUNTER — Other Ambulatory Visit: Payer: Self-pay | Admitting: *Deleted

## 2017-04-30 VITALS — BP 144/94 | HR 109 | Resp 19

## 2017-04-30 DIAGNOSIS — C3401 Malignant neoplasm of right main bronchus: Secondary | ICD-10-CM | POA: Diagnosis not present

## 2017-04-30 DIAGNOSIS — C3491 Malignant neoplasm of unspecified part of right bronchus or lung: Secondary | ICD-10-CM

## 2017-04-30 DIAGNOSIS — Z5112 Encounter for antineoplastic immunotherapy: Secondary | ICD-10-CM | POA: Diagnosis not present

## 2017-04-30 DIAGNOSIS — Z5111 Encounter for antineoplastic chemotherapy: Secondary | ICD-10-CM

## 2017-04-30 LAB — COMPREHENSIVE METABOLIC PANEL
ALT: 19 U/L (ref 0–55)
AST: 22 U/L (ref 5–34)
Albumin: 3.7 g/dL (ref 3.5–5.0)
Alkaline Phosphatase: 88 U/L (ref 40–150)
Anion Gap: 16 mEq/L — ABNORMAL HIGH (ref 3–11)
BUN: 28 mg/dL — AB (ref 7.0–26.0)
CHLORIDE: 101 meq/L (ref 98–109)
CO2: 19 mEq/L — ABNORMAL LOW (ref 22–29)
Calcium: 7.6 mg/dL — ABNORMAL LOW (ref 8.4–10.4)
Creatinine: 1.5 mg/dL — ABNORMAL HIGH (ref 0.7–1.3)
EGFR: 46 mL/min/{1.73_m2} — ABNORMAL LOW (ref >60)
GLUCOSE: 277 mg/dL — AB (ref 70–140)
POTASSIUM: 4.4 meq/L (ref 3.5–5.1)
SODIUM: 135 meq/L — AB (ref 136–145)
Total Bilirubin: 0.57 mg/dL (ref 0.20–1.20)
Total Protein: 7.7 g/dL (ref 6.4–8.3)

## 2017-04-30 LAB — CBC WITH DIFFERENTIAL/PLATELET
BASO%: 0.1 % (ref 0.0–2.0)
Basophils Absolute: 0 10*3/uL (ref 0.0–0.1)
EOS ABS: 0 10*3/uL (ref 0.0–0.5)
EOS%: 0 % (ref 0.0–7.0)
HEMATOCRIT: 32.7 % — AB (ref 38.4–49.9)
HGB: 10.9 g/dL — ABNORMAL LOW (ref 13.0–17.1)
LYMPH#: 1.3 10*3/uL (ref 0.9–3.3)
LYMPH%: 12.5 % — AB (ref 14.0–49.0)
MCH: 34 pg — ABNORMAL HIGH (ref 27.2–33.4)
MCHC: 33.3 g/dL (ref 32.0–36.0)
MCV: 101.9 fL — AB (ref 79.3–98.0)
MONO#: 1.2 10*3/uL — AB (ref 0.1–0.9)
MONO%: 11.5 % (ref 0.0–14.0)
NEUT%: 75.9 % — AB (ref 39.0–75.0)
NEUTROS ABS: 7.9 10*3/uL — AB (ref 1.5–6.5)
PLATELETS: 118 10*3/uL — AB (ref 140–400)
RBC: 3.21 10*6/uL — AB (ref 4.20–5.82)
RDW: 19.3 % — ABNORMAL HIGH (ref 11.0–14.6)
WBC: 10.5 10*3/uL — ABNORMAL HIGH (ref 4.0–10.3)

## 2017-04-30 LAB — UA PROTEIN, DIPSTICK - CHCC: Protein, ur: 100 mg/dL

## 2017-04-30 MED ORDER — DIPHENHYDRAMINE HCL 50 MG/ML IJ SOLN
50.0000 mg | Freq: Once | INTRAMUSCULAR | Status: AC
  Administered 2017-04-30: 50 mg via INTRAVENOUS

## 2017-04-30 MED ORDER — SODIUM CHLORIDE 0.9 % IV SOLN
14.9000 mg/kg | Freq: Once | INTRAVENOUS | Status: AC
  Administered 2017-04-30: 1200 mg via INTRAVENOUS
  Filled 2017-04-30: qty 48

## 2017-04-30 MED ORDER — DIPHENHYDRAMINE HCL 50 MG/ML IJ SOLN
INTRAMUSCULAR | Status: AC
  Filled 2017-04-30: qty 1

## 2017-04-30 MED ORDER — SODIUM CHLORIDE 0.9 % IV SOLN
Freq: Once | INTRAVENOUS | Status: AC
  Administered 2017-04-30: 08:00:00 via INTRAVENOUS

## 2017-04-30 MED ORDER — HEPARIN SOD (PORK) LOCK FLUSH 100 UNIT/ML IV SOLN
500.0000 [IU] | Freq: Once | INTRAVENOUS | Status: AC | PRN
  Administered 2017-04-30: 500 [IU]
  Filled 2017-04-30: qty 5

## 2017-04-30 MED ORDER — PALONOSETRON HCL INJECTION 0.25 MG/5ML
0.2500 mg | Freq: Once | INTRAVENOUS | Status: AC
  Administered 2017-04-30: 0.25 mg via INTRAVENOUS

## 2017-04-30 MED ORDER — SODIUM CHLORIDE 0.9 % IV SOLN
380.0000 mg | Freq: Once | INTRAVENOUS | Status: AC
  Administered 2017-04-30: 380 mg via INTRAVENOUS
  Filled 2017-04-30: qty 38

## 2017-04-30 MED ORDER — FAMOTIDINE IN NACL 20-0.9 MG/50ML-% IV SOLN
INTRAVENOUS | Status: AC
  Filled 2017-04-30: qty 50

## 2017-04-30 MED ORDER — FAMOTIDINE IN NACL 20-0.9 MG/50ML-% IV SOLN
20.0000 mg | Freq: Once | INTRAVENOUS | Status: AC
  Administered 2017-04-30: 20 mg via INTRAVENOUS

## 2017-04-30 MED ORDER — PALONOSETRON HCL INJECTION 0.25 MG/5ML
INTRAVENOUS | Status: AC
  Filled 2017-04-30: qty 5

## 2017-04-30 MED ORDER — SODIUM CHLORIDE 0.9 % IV SOLN
Freq: Once | INTRAVENOUS | Status: AC
  Administered 2017-04-30: 11:00:00 via INTRAVENOUS
  Filled 2017-04-30: qty 5

## 2017-04-30 MED ORDER — SODIUM CHLORIDE 0.9% FLUSH
10.0000 mL | INTRAVENOUS | Status: DC | PRN
  Administered 2017-04-30: 10 mL
  Filled 2017-04-30: qty 10

## 2017-04-30 MED ORDER — SODIUM CHLORIDE 0.9 % IV SOLN
495.0000 mg/m2 | Freq: Once | INTRAVENOUS | Status: AC
  Administered 2017-04-30: 1000 mg via INTRAVENOUS
  Filled 2017-04-30: qty 40

## 2017-04-30 NOTE — Patient Instructions (Signed)
[facility] Discharge Instructions for Patients Receiving Chemotherapy  Today you received the following chemotherapy agents Avastin, Alimta and Carboplatin  To help prevent nausea and vomiting after your treatment, we encourage you to take your nausea medication as directed  If you develop nausea and vomiting that is not controlled by your nausea medication, call the clinic.   BELOW ARE SYMPTOMS THAT SHOULD BE REPORTED IMMEDIATELY:  *FEVER GREATER THAN 100.5 F  *CHILLS WITH OR WITHOUT FEVER  NAUSEA AND VOMITING THAT IS NOT CONTROLLED WITH YOUR NAUSEA MEDICATION  *UNUSUAL SHORTNESS OF BREATH  *UNUSUAL BRUISING OR BLEEDING  TENDERNESS IN MOUTH AND THROAT WITH OR WITHOUT PRESENCE OF ULCERS  *URINARY PROBLEMS  *BOWEL PROBLEMS  UNUSUAL RASH Items with * indicate a potential emergency and should be followed up as soon as possible.  Feel free to call the clinic should you have any questions or concerns. The clinic phone number is [phone].  Please show the [facility] at check-in to the Emergency Department and triage nurse.

## 2017-04-30 NOTE — Progress Notes (Signed)
Dr. Julien Nordmann made aware of today's lab results and vital signs. Okay to treat per Dr. Julien Nordmann.

## 2017-05-02 ENCOUNTER — Other Ambulatory Visit: Payer: Medicare Other

## 2017-05-07 ENCOUNTER — Ambulatory Visit (HOSPITAL_COMMUNITY)
Admission: RE | Admit: 2017-05-07 | Discharge: 2017-05-07 | Disposition: A | Discharge status: Home / Self Care | Payer: Medicare Other | Source: Ambulatory Visit | Attending: Medical | Admitting: Medical

## 2017-05-07 ENCOUNTER — Other Ambulatory Visit: Payer: Self-pay

## 2017-05-07 ENCOUNTER — Other Ambulatory Visit: Payer: Self-pay | Admitting: Medical

## 2017-05-07 ENCOUNTER — Inpatient Hospital Stay (HOSPITAL_BASED_OUTPATIENT_CLINIC_OR_DEPARTMENT_OTHER): Payer: Medicare Other | Admitting: Medical

## 2017-05-07 ENCOUNTER — Inpatient Hospital Stay: Payer: Medicare Other | Attending: Internal Medicine

## 2017-05-07 VITALS — BP 125/88 | HR 144 | Temp 97.8°F | Resp 28 | Ht 73.0 in

## 2017-05-07 DIAGNOSIS — R197 Diarrhea, unspecified: Secondary | ICD-10-CM

## 2017-05-07 DIAGNOSIS — J069 Acute upper respiratory infection, unspecified: Secondary | ICD-10-CM

## 2017-05-07 DIAGNOSIS — C3490 Malignant neoplasm of unspecified part of unspecified bronchus or lung: Secondary | ICD-10-CM | POA: Diagnosis not present

## 2017-05-07 DIAGNOSIS — K1230 Oral mucositis (ulcerative), unspecified: Secondary | ICD-10-CM | POA: Insufficient documentation

## 2017-05-07 DIAGNOSIS — R059 Cough, unspecified: Secondary | ICD-10-CM

## 2017-05-07 DIAGNOSIS — R05 Cough: Secondary | ICD-10-CM | POA: Insufficient documentation

## 2017-05-07 DIAGNOSIS — J189 Pneumonia, unspecified organism: Secondary | ICD-10-CM

## 2017-05-07 DIAGNOSIS — R000 Tachycardia, unspecified: Secondary | ICD-10-CM

## 2017-05-07 DIAGNOSIS — C7971 Secondary malignant neoplasm of right adrenal gland: Secondary | ICD-10-CM | POA: Insufficient documentation

## 2017-05-07 DIAGNOSIS — J181 Lobar pneumonia, unspecified organism: Secondary | ICD-10-CM | POA: Diagnosis not present

## 2017-05-07 DIAGNOSIS — E860 Dehydration: Secondary | ICD-10-CM | POA: Insufficient documentation

## 2017-05-07 DIAGNOSIS — J449 Chronic obstructive pulmonary disease, unspecified: Secondary | ICD-10-CM | POA: Insufficient documentation

## 2017-05-07 DIAGNOSIS — R918 Other nonspecific abnormal finding of lung field: Secondary | ICD-10-CM | POA: Insufficient documentation

## 2017-05-07 DIAGNOSIS — C7972 Secondary malignant neoplasm of left adrenal gland: Secondary | ICD-10-CM | POA: Insufficient documentation

## 2017-05-07 DIAGNOSIS — R062 Wheezing: Secondary | ICD-10-CM | POA: Insufficient documentation

## 2017-05-07 DIAGNOSIS — Z923 Personal history of irradiation: Secondary | ICD-10-CM | POA: Insufficient documentation

## 2017-05-07 LAB — CBC WITH DIFFERENTIAL (CANCER CENTER ONLY)
Abs Granulocyte: 1.5 10*3/uL (ref 1.5–6.5)
BASOS ABS: 0 10*3/uL (ref 0.0–0.1)
Basophils Relative: 0 %
EOS ABS: 0 10*3/uL (ref 0.0–0.5)
EOS PCT: 1 %
HCT: 30.5 % — ABNORMAL LOW (ref 38.4–49.9)
Hemoglobin: 10.5 g/dL — ABNORMAL LOW (ref 13.0–17.1)
LYMPHS ABS: 0.8 10*3/uL — AB (ref 0.9–3.3)
Lymphocytes Relative: 30 %
MCH: 34.7 pg — AB (ref 27.2–33.4)
MCHC: 34.4 g/dL (ref 32.0–36.0)
MCV: 100.7 fL — ABNORMAL HIGH (ref 79.3–98.0)
MONO ABS: 0.3 10*3/uL (ref 0.1–0.9)
Monocytes Relative: 12 %
NEUTROS PCT: 57 %
Neutro Abs: 1.5 10*3/uL (ref 1.5–6.5)
PLATELETS: 76 10*3/uL — AB (ref 140–400)
RBC: 3.03 MIL/uL — ABNORMAL LOW (ref 4.20–5.82)
RDW: 17.1 % — ABNORMAL HIGH (ref 11.0–15.6)
WBC Count: 2.6 10*3/uL — ABNORMAL LOW (ref 4.0–10.3)

## 2017-05-07 LAB — CMP (CANCER CENTER ONLY)
ALK PHOS: 82 U/L (ref 40–150)
ALT: 52 U/L (ref 0–55)
AST: 55 U/L — ABNORMAL HIGH (ref 5–34)
Albumin: 3.5 g/dL (ref 3.5–5.0)
Anion gap: 13 — ABNORMAL HIGH (ref 3–11)
BUN: 28 mg/dL — ABNORMAL HIGH (ref 7–26)
CALCIUM: 8.6 mg/dL (ref 8.4–10.4)
CO2: 21 mmol/L — AB (ref 22–29)
CREATININE: 1.23 mg/dL (ref 0.70–1.30)
Chloride: 100 mmol/L (ref 98–109)
GFR, EST NON AFRICAN AMERICAN: 55 mL/min — AB (ref >60)
Glucose, Bld: 164 mg/dL — ABNORMAL HIGH (ref 70–140)
Potassium: 4.5 mmol/L (ref 3.5–5.1)
SODIUM: 134 mmol/L — AB (ref 136–145)
Total Bilirubin: 0.7 mg/dL (ref 0.2–1.2)
Total Protein: 8 g/dL (ref 6.4–8.3)

## 2017-05-07 LAB — MAGNESIUM: MAGNESIUM: 1 mg/dL — AB (ref 1.5–2.5)

## 2017-05-07 MED ORDER — HEPARIN SOD (PORK) LOCK FLUSH 100 UNIT/ML IV SOLN
500.0000 [IU] | Freq: Once | INTRAVENOUS | Status: AC
  Administered 2017-05-07: 500 [IU] via INTRAVENOUS
  Filled 2017-05-07: qty 5

## 2017-05-07 MED ORDER — SODIUM CHLORIDE 0.9% FLUSH
10.0000 mL | Freq: Once | INTRAVENOUS | Status: AC
  Administered 2017-05-07: 10 mL via INTRAVENOUS
  Filled 2017-05-07: qty 10

## 2017-05-07 MED ORDER — DIPHENOXYLATE-ATROPINE 2.5-0.025 MG PO TABS: 2.0000 | ORAL_TABLET | Freq: Four times a day (QID) | ORAL | 1 refills | Status: AC | PRN

## 2017-05-07 MED ORDER — LEVOFLOXACIN 500 MG PO TABS: 500.0000 mg | ORAL_TABLET | Freq: Every day | ORAL | 0 refills | Status: DC

## 2017-05-07 MED ORDER — GI COCKTAIL ~~LOC~~
30.0000 mL | Freq: Once | ORAL | Status: DC
  Filled 2017-05-07: qty 30

## 2017-05-07 MED ORDER — SODIUM CHLORIDE 0.9 % IV SOLN
4.0000 g | Freq: Once | INTRAVENOUS | Status: AC
  Administered 2017-05-07: 4 g via INTRAVENOUS
  Filled 2017-05-07: qty 8

## 2017-05-07 MED ORDER — BENZONATATE 100 MG PO CAPS: 200.0000 mg | ORAL_CAPSULE | Freq: Three times a day (TID) | ORAL | 0 refills | Status: DC | PRN

## 2017-05-07 MED ORDER — SODIUM CHLORIDE 0.9 % IV SOLN
Freq: Once | INTRAVENOUS | Status: AC
  Administered 2017-05-07: 14:00:00 via INTRAVENOUS

## 2017-05-07 MED ORDER — LEVOFLOXACIN IN D5W 500 MG/100ML IV SOLN
500.0000 mg | Freq: Once | INTRAVENOUS | Status: AC
  Administered 2017-05-07: 500 mg via INTRAVENOUS
  Filled 2017-05-07: qty 100

## 2017-05-07 NOTE — Patient Instructions (Signed)
Community-Acquired Pneumonia, Adult Pneumonia is an infection of the lungs. One type of pneumonia can happen while a person is in a hospital. A different type can happen when a person is not in a hospital (community-acquired pneumonia). It is easy for this kind to spread from person to person. It can spread to you if you breathe near an infected person who coughs or sneezes. Some symptoms include:  A dry cough.  A wet (productive) cough.  Fever.  Sweating.  Chest pain.  Follow these instructions at home:  Take over-the-counter and prescription medicines only as told by your doctor. ? Only take cough medicine if you are losing sleep. ? If you were prescribed an antibiotic medicine, take it as told by your doctor. Do not stop taking the antibiotic even if you start to feel better.  Sleep with your head and neck raised (elevated). You can do this by putting a few pillows under your head, or you can sleep in a recliner.  Do not use tobacco products. These include cigarettes, chewing tobacco, and e-cigarettes. If you need help quitting, ask your doctor.  Drink enough water to keep your pee (urine) clear or pale yellow. A shot (vaccine) can help prevent pneumonia. Shots are often suggested for:  People older than 78 years of age.  People older than 78 years of age: ? Who are having cancer treatment. ? Who have long-term (chronic) lung disease. ? Who have problems with their body's defense system (immune system).  You may also prevent pneumonia if you take these actions:  Get the flu (influenza) shot every year.  Go to the dentist as often as told.  Wash your hands often. If soap and water are not available, use hand sanitizer.  Contact a doctor if:  You have a fever.  You lose sleep because your cough medicine does not help. Get help right away if:  You are short of breath and it gets worse.  You have more chest pain.  Your sickness gets worse. This is very serious  if: ? You are an older adult. ? Your body's defense system is weak.  You cough up blood. This information is not intended to replace advice given to you by your health care provider. Make sure you discuss any questions you have with your health care provider. Document Released: 10/04/2007 Document Revised: 09/23/2015 Document Reviewed: 08/12/2014 Elsevier Interactive Patient Education  2018 [employer].    Diarrhea, Adult Diarrhea is frequent loose and watery bowel movements. Diarrhea can make you feel weak and cause you to become dehydrated. Dehydration can make you tired and thirsty, cause you to have a dry mouth, and decrease how often you urinate. Diarrhea typically lasts 2-3 days. However, it can last longer if it is a sign of something more serious. It is important to treat your diarrhea as told by your health care provider. Follow these instructions at home: Eating and drinking  Follow these recommendations as told by your health care provider:  Take an oral rehydration solution (ORS). This is a drink that is sold at pharmacies and retail stores.  Drink clear fluids, such as water, ice chips, diluted fruit juice, and low-calorie sports drinks.  Eat bland, easy-to-digest foods in small amounts as you are able. These foods include bananas, applesauce, rice, lean meats, toast, and crackers.  Avoid drinking fluids that contain a lot of sugar or caffeine, such as energy drinks, sports drinks, and soda.  Avoid alcohol.  Avoid spicy or fatty foods.  General instructions  Drink enough fluid to keep your urine clear or pale yellow.  Wash your hands often. If soap and water are not available, use hand sanitizer.  Make sure that all people in your household wash their hands well and often.  Take over-the-counter and prescription medicines only as told by your health care provider.  Rest at home while you recover.  Watch your condition for any changes.  Take a warm bath to  relieve any burning or pain from frequent diarrhea episodes.  Keep all follow-up visits as told by your health care provider. This is important. Contact a health care provider if:  You have a fever.  Your diarrhea gets worse.  You have new symptoms.  You cannot keep fluids down.  You feel light-headed or dizzy.  You have a headache  You have muscle cramps. Get help right away if:  You have chest pain.  You feel extremely weak or you faint.  You have bloody or black stools or stools that look like tar.  You have severe pain, cramping, or bloating in your abdomen.  You have trouble breathing or you are breathing very quickly.  Your heart is beating very quickly.  Your skin feels cold and clammy.  You feel confused.  You have signs of dehydration, such as: ? Dark urine, very little urine, or no urine. ? Cracked lips. ? Dry mouth. ? Sunken eyes. ? Sleepiness. ? Weakness. This information is not intended to replace advice given to you by your health care provider. Make sure you discuss any questions you have with your health care provider. Document Released: 04/07/2002 Document Revised: 08/26/2015 Document Reviewed: 12/22/2014 Elsevier Interactive Patient Education  2018 [employer].   Hypomagnesemia Hypomagnesemia is a condition in which the level of magnesium in the blood is low. Magnesium is a mineral that is found in many foods. It is used in many different processes in the body. Hypomagnesemia can affect every organ in the body. It can cause life-threatening problems. What are the causes? Causes of hypomagnesemia include:  Not getting enough magnesium in your diet.  Malnutrition.  Problems with absorbing magnesium from the intestines.  Dehydration.  Alcohol abuse.  Vomiting.  Severe diarrhea.  Some medicines, including medicines that make you urinate more.  Certain diseases, such as kidney disease, diabetes, and overactive thyroid.  What are  the signs or symptoms?  Involuntary shaking or trembling of a body part (tremor).  Confusion.  Muscle weakness.  Sensitivity to light, sound, and touch.  Psychiatric issues, such as depression, irritability, or psychosis.  Sudden tightening of muscles (muscle spasms).  Tingling in the arms and legs.  A feeling of fluttering of the heart. These symptoms are more severe if magnesium levels drop suddenly. How is this diagnosed? To make a diagnosis, your health care provider will do a physical exam and order blood and urine tests. How is this treated? Treatment will depend on the cause and the severity of your condition. It may involve:  A magnesium supplement. This can be taken in pill form. It can also be given through an IV tube. This is usually done if the condition is severe.  Changes to your diet. You may be directed to eat foods that have a lot of magnesium, such as green leafy vegetables, peas, beans, and nuts.  Eliminating alcohol from your diet.  Follow these instructions at home:  Include foods with magnesium in your diet. Foods that are rich in magnesium include green vegetables, beans,  nuts and seeds, and whole grains.  Take medicines only as directed by your health care provider.  Take magnesium supplements if your health care provider instructs you to do that. Take them as directed.  Have your magnesium levels monitored as directed by your health care provider.  When you are active, drink fluids that contain electrolytes.  Keep all follow-up visits as directed by your health care provider. This is important. Contact a health care provider if:  You get worse instead of better.  Your symptoms return. Get help right away if:  Your symptoms are severe. This information is not intended to replace advice given to you by your health care provider. Make sure you discuss any questions you have with your health care provider. Document Released: 01/11/2005 Document  Revised: 09/23/2015 Document Reviewed: 12/01/2013 Elsevier Interactive Patient Education  2018 [employer].

## 2017-05-08 ENCOUNTER — Telehealth: Payer: Self-pay | Admitting: *Deleted

## 2017-05-08 NOTE — Progress Notes (Signed)
[facility] Progress Note   GEARY RUFO [id number] 05-09-1940 77 y.o.  [name] is managed by Dr. Eilleen Kempf  Actively treated with chemotherapy: yes  Current Therapy:  Avastin, carboplatin, and Alimta  Last Treated:  04/30/2017 (cycle 6, day 1)  Assessment: Plan:    Tachycardia - Plan: EKG 12-Lead, sodium chloride flush (NS) 0.9 % injection 10 mL, heparin lock flush 100 unit/mL, 0.9 %  sodium chloride infusion  Hypomagnesemia - Plan: magnesium sulfate 4 g in sodium chloride 0.9 % 1,000 mL, sodium chloride flush (NS) 0.9 % injection 10 mL, heparin lock flush 100 unit/mL, 0.9 %  sodium chloride infusion  Mucositis oral - Plan: gi cocktail (Maalox,Lidocaine,Donnatal)  Cough in adult - Plan: benzonatate (TESSALON) 100 MG capsule  Diarrhea, unspecified type - Plan: diphenoxylate-atropine (LOMOTIL) 2.5-0.025 MG tablet  Pneumonia of left lower lobe due to infectious organism ([place]) - Plan: levofloxacin (LEVAQUIN) IVPB 500 mg, sodium chloride flush (NS) 0.9 % injection 10 mL, heparin lock flush 100 unit/mL, 0.9 %  sodium chloride infusion, DG Chest 2 View, levofloxacin (LEVAQUIN) 500 MG tablet   Tachycardia: An EKG returned showing sinus tachycardia with possible left atrial enlargement.  Hypomagnesemia: The patient was given 4 g of magnesium sulfate IV in 1000 mL's of normal saline.  Mucositis: The patient was given a GI cocktail to swish and swallow or spit so that he could eat today.  Cough: The patient was given a prescription for [employer]  Diarrhea: The patient was given a prescription for Lomotil 1-2 p.o. 4 times daily as needed.  Left lower lobe pneumonia: The patient was referred for a chest x-ray which returned showing: Left lower lobe pneumonia. Fairly stable right hilar mass. A follow up PA and lateral chest X-ray is recommended in 3-4 weeks following trial of antibiotic therapy.  Patient was given Levaquin 500 mg IV x1 and was given  a prescription for Levaquin 500 mg once daily for 7 days and is instructed to begin this tomorrow.  Please see After Visit Summary for patient specific instructions.  Future Appointments  Date Time Provider [place]  05/14/2017 11:30 AM [facility] LAB 2 [facility] None  05/21/2017  9:30 AM [facility] LAB 4 [facility] None  05/21/2017  9:45 AM [facility] PROCEDURE 2 [facility] None  05/21/2017 10:15 AM Curt Bears, MD [facility] None  05/21/2017 11:15 AM [facility] E17 [facility] None    Orders Placed This Encounter  Procedures  . DG Chest 2 View  . EKG 12-Lead       Subjective:   Patient ID:  RAHEEL KUNKLE is a 77 y.o. (DOB [birth year]) male.  Chief Complaint:  Chief Complaint  Patient presents with  . Fatigue    decrease appetite, dehydration    HPI GILLIS BOARDLEY is a 77 year old male with a history of a metastatic non-small cell lung cancer, adenocarcinoma which was initially diagnosed as an unresectable stage IIa (T1a, N1, M0) with a right hilar mass and right hilar adenopathy in September 2017.  The patient now has metastatic disease as documented on a PET scan completed on 12/23/2016 which showed a persistent hypermetabolic right hilar lesion, substantial interstitial and airspace disease in the right perihilar lung compatible with postradiation fibrosis and bilateral hypermetabolic adrenal lesions.  He was most recently treated with cycle 6-day 1 of Avastin, carboplatin, and Alimta on 04/30/2017.  He has been troubled with diarrhea during his treatment but has had a significant flare since his last treatment.  He  is only been taking around 1 Imodium per day.  He has anorexia, increased fatigue, oral tenderness, and a sensation of food tongue when he tries to swallow.  He has been drinking boost.  He also reports having a nonproductive cough.  He has been having chills but no fever.  Medications: I have reviewed the patient's current  medications.  Allergies:  Allergies  Allergen Reactions  . Hydrocodone Rash  . Oxycodone Rash    Past Medical History:  Diagnosis Date  . Arthritis   . Complication of anesthesia    problems voiding post op  . Full dentures   . GERD (gastroesophageal reflux disease)   . History of radiation therapy 01/25/16-02/28/16   right lung 45 Gy  . History of radiation therapy 04/17/16-05/02/16   right lung boost 20 Gy in 10 fractions cumulative dose 65 gray  . HOH (hard of hearing)   . HTN (hypertension)   . Hyperlipidemia   . Lung mass 12/30/2015  . Odynophagia 04/06/2016  . Psoriatic arthritis ([place])   . Snores   . Wears glasses     Past Surgical History:  Procedure Laterality Date  . COLONOSCOPY    . HEMORRHOID SURGERY N/A 10/01/2013   Procedure: EXAM UNDER ANESTHESIA  AND EXCISIONAL HEMORRHOIDECTOMY WITH HEMORRHOID BANDING X 2;  Surgeon: Gayland Curry, MD;  Location: [place];  Service: General;  Laterality: N/A;  . INGUINAL HERNIA REPAIR  01/04/2012   Procedure: LAPAROSCOPIC INGUINAL HERNIA;  Surgeon: Gayland Curry, MD,FACS;  Location: [facility];  Service: General;  Laterality: Right;  . IR FLUORO GUIDE PORT INSERTION RIGHT  01/02/2017  . IR US GUIDE VASC ACCESS RIGHT  01/02/2017  . VIDEO BRONCHOSCOPY WITH ENDOBRONCHIAL ULTRASOUND N/A 12/31/2015   Procedure: VIDEO BRONCHOSCOPY WITH ENDOBRONCHIAL ULTRASOUND transbronchial biopsy of node 10 R lymph node;  Surgeon: Grace Isaac, MD;  Location: [facility];  Service: Thoracic;  Laterality: N/A;    Family History  Problem Relation Age of Onset  . Stroke Father   . Diabetes Father   . Heart disease Father   . Cancer Brother        pancreatic    Social History   Socioeconomic History  . Marital status: Married    Spouse name: Not on file  . Number of children: Not on file  . Years of education: Not on file  . Highest education level: Not on file  Social Needs  . Financial resource strain: Not on file  . Food insecurity  - worry: Not on file  . Food insecurity - inability: Not on file  . Transportation needs - medical: Not on file  . Transportation needs - non-medical: Not on file  Occupational History  . Not on file  Tobacco Use  . Smoking status: Former Smoker    Packs/day: 0.50    Years: 50.00    Pack years: 25.00    Types: Cigarettes    Last attempt to quit: 11/30/2015    Years since quitting: 1.4  . Smokeless tobacco: Never Used  Substance and Sexual Activity  . Alcohol use: Yes    Comment: occassionally  . Drug use: No  . Sexual activity: Not Currently  Other Topics Concern  . Not on file  Social History Narrative  . Not on file    Past Medical History, Surgical history, Social history, and Family history were reviewed and updated as appropriate.   Please see review of systems for further details on the patient's review  from today.   Review of Systems:  Review of Systems  Constitutional: Positive for appetite change, chills and fatigue. Negative for diaphoresis and fever.  HENT: Positive for mouth sores and trouble swallowing (Sensation of food getting hung in his throat.).   Respiratory: Positive for cough and wheezing. Negative for choking and shortness of breath.   Cardiovascular: Positive for palpitations. Negative for chest pain and leg swelling.  Gastrointestinal: Positive for diarrhea. Negative for nausea and vomiting.  Neurological: Positive for weakness. Negative for dizziness and headaches.    Objective:   Physical Exam:  BP 125/88 (BP Location: Right Arm, Patient Position: Sitting)   Pulse (!) 144   Temp 97.8 F (36.6 C) (Oral)   Resp (!) 28   Ht 6\' 1"  (1.854 m)   SpO2 100%   BMI 23.81 kg/m   ECOG: 1  Physical Exam  Constitutional: No distress.  HENT:  Head: Normocephalic and atraumatic.  Mouth/Throat:    Eyes: Right eye exhibits no discharge. Left eye exhibits no discharge. No scleral icterus.  Neck: Normal range of motion. Neck supple.  Cardiovascular:  S1 normal and S2 normal. Tachycardia present.  Pulmonary/Chest: No respiratory distress. He has wheezes (Diffuse wheezing throughout all lung fields. ).  Abdominal: Soft. Bowel sounds are normal. He exhibits no distension. There is no tenderness. There is no rebound and no guarding.  Musculoskeletal: Normal range of motion. He exhibits no tenderness.  Lymphadenopathy:    He has no cervical adenopathy.  Neurological: He is alert.  Skin: Skin is warm and dry. He is not diaphoretic.  Psychiatric: He has a normal mood and affect. His behavior is normal. Judgment and thought content normal.    Lab Review:     Component Value Date/Time   NA 134 (L) 05/07/2017 1200   NA 135 (L) 04/30/2017 0824   K 4.5 05/07/2017 1200   K 4.4 04/30/2017 0824   CL 100 05/07/2017 1200   CO2 21 (L) 05/07/2017 1200   CO2 19 (L) 04/30/2017 0824   GLUCOSE 164 (H) 05/07/2017 1200   GLUCOSE 277 (H) 04/30/2017 0824   BUN 28 (H) 05/07/2017 1200   BUN 28.0 (H) 04/30/2017 0824   CREATININE 1.5 (H) 04/30/2017 0824   CALCIUM 8.6 05/07/2017 1200   CALCIUM 7.6 (L) 04/30/2017 0824   PROT 8.0 05/07/2017 1200   PROT 7.7 04/30/2017 0824   ALBUMIN 3.5 05/07/2017 1200   ALBUMIN 3.7 04/30/2017 0824   AST 55 (H) 05/07/2017 1200   AST 22 04/30/2017 0824   ALT 52 05/07/2017 1200   ALT 19 04/30/2017 0824   ALKPHOS 82 05/07/2017 1200   ALKPHOS 88 04/30/2017 0824   BILITOT 0.7 05/07/2017 1200   BILITOT 0.57 04/30/2017 0824   GFRNONAA 49 (L) 01/02/2017 0731   GFRAA 57 (L) 01/02/2017 0731       Component Value Date/Time   WBC 10.5 (H) 04/30/2017 0824   WBC 10.7 (H) 01/02/2017 0731   RBC 3.03 (L) 05/07/2017 1200   HGB 10.9 (L) 04/30/2017 0824   HCT 30.5 (L) 05/07/2017 1200   HCT 32.7 (L) 04/30/2017 0824   PLT 118 (L) 04/30/2017 0824   MCV 100.7 (H) 05/07/2017 1200   MCV 101.9 (H) 04/30/2017 0824   MCH 34.7 (H) 05/07/2017 1200   MCHC 34.4 05/07/2017 1200   RDW 17.1 (H) 05/07/2017 1200   RDW 19.3 (H) 04/30/2017 0824    LYMPHSABS 0.8 (L) 05/07/2017 1200   LYMPHSABS 1.3 04/30/2017 0824   MONOABS 0.3 05/07/2017 1200  MONOABS 1.2 (H) 04/30/2017 0824   EOSABS 0.0 05/07/2017 1200   EOSABS 0.0 04/30/2017 0824   BASOSABS 0.0 05/07/2017 1200   BASOSABS 0.0 04/30/2017 0824   -------------------------------  Imaging from last 24 hours (if applicable):  Radiology interpretation: Dg Chest 2 View  Result Date: 05/07/2017 CLINICAL DATA:  Cough, wheezing and shortness of breath. Currently on chemotherapy for lung malignancy. EXAM: CHEST  2 VIEW COMPARISON:  Chest x-ray of March 05, 2016 and chest CT scan of March 08, 2017 FINDINGS: A right hilar soft tissue mass persists. There is new patchy infiltrate in the left lower lobe. The heart and pulmonary vascularity are normal. The power port catheter tip projects over the junction of the middle and distal thirds of the SVC. IMPRESSION: Left lower lobe pneumonia. Fairly stable right hilar mass. Followup PA and lateral chest X-ray is recommended in 3-4 weeks following trial of antibiotic therapy to ensure resolution and exclude underlying malignancy. Electronically Signed   By: David  Martinique M.D.   On: 05/07/2017 16:43        This case was discussed with Dr. Julien Nordmann. He expressed agreement with my management of this patient.

## 2017-05-08 NOTE — Telephone Encounter (Signed)
TCT to follow up on his [facility] visit yesterday, 05/07/17. Spoke with patient. He states he feels better than he did yesterday. States his mouth is still sore but is using Magic Mouthwash as directed with relief. Drinking fluids and taking antibiotics as ordered. He voices understanding to call us if he feels worse at any point, fever >100.5, diarrhea or inability to keep up with his oral fluids. He voiced gratitude regarding care he received yesterday and follow up phone call today.

## 2017-05-09 ENCOUNTER — Encounter (HOSPITAL_COMMUNITY): Payer: Self-pay

## 2017-05-09 ENCOUNTER — Other Ambulatory Visit: Payer: Self-pay

## 2017-05-09 ENCOUNTER — Telehealth: Payer: Self-pay | Admitting: Medical Oncology

## 2017-05-09 ENCOUNTER — Emergency Department (HOSPITAL_COMMUNITY): Payer: Medicare Other

## 2017-05-09 ENCOUNTER — Inpatient Hospital Stay (HOSPITAL_COMMUNITY)
Admission: EM | Admit: 2017-05-09 | Discharge: 2017-05-12 | DRG: 871 | Disposition: A | Discharge status: Home / Self Care | Payer: Medicare Other | Attending: Internal Medicine | Admitting: Internal Medicine

## 2017-05-09 ENCOUNTER — Inpatient Hospital Stay (HOSPITAL_COMMUNITY): Payer: Medicare Other

## 2017-05-09 DIAGNOSIS — L4050 Arthropathic psoriasis, unspecified: Secondary | ICD-10-CM | POA: Diagnosis present

## 2017-05-09 DIAGNOSIS — J700 Acute pulmonary manifestations due to radiation: Secondary | ICD-10-CM | POA: Diagnosis present

## 2017-05-09 DIAGNOSIS — D61818 Other pancytopenia: Secondary | ICD-10-CM | POA: Diagnosis present

## 2017-05-09 DIAGNOSIS — N183 Chronic kidney disease, stage 3 (moderate): Secondary | ICD-10-CM | POA: Diagnosis present

## 2017-05-09 DIAGNOSIS — J181 Lobar pneumonia, unspecified organism: Secondary | ICD-10-CM | POA: Diagnosis present

## 2017-05-09 DIAGNOSIS — J440 Chronic obstructive pulmonary disease with acute lower respiratory infection: Secondary | ICD-10-CM | POA: Diagnosis present

## 2017-05-09 DIAGNOSIS — Z7982 Long term (current) use of aspirin: Secondary | ICD-10-CM | POA: Diagnosis not present

## 2017-05-09 DIAGNOSIS — R000 Tachycardia, unspecified: Secondary | ICD-10-CM

## 2017-05-09 DIAGNOSIS — Z87891 Personal history of nicotine dependence: Secondary | ICD-10-CM

## 2017-05-09 DIAGNOSIS — H9190 Unspecified hearing loss, unspecified ear: Secondary | ICD-10-CM | POA: Diagnosis present

## 2017-05-09 DIAGNOSIS — I129 Hypertensive chronic kidney disease with stage 1 through stage 4 chronic kidney disease, or unspecified chronic kidney disease: Secondary | ICD-10-CM | POA: Diagnosis present

## 2017-05-09 DIAGNOSIS — E871 Hypo-osmolality and hyponatremia: Secondary | ICD-10-CM | POA: Diagnosis present

## 2017-05-09 DIAGNOSIS — E860 Dehydration: Secondary | ICD-10-CM | POA: Diagnosis present

## 2017-05-09 DIAGNOSIS — E872 Acidosis: Secondary | ICD-10-CM | POA: Diagnosis present

## 2017-05-09 DIAGNOSIS — A419 Sepsis, unspecified organism: Secondary | ICD-10-CM | POA: Diagnosis present

## 2017-05-09 DIAGNOSIS — C7971 Secondary malignant neoplasm of right adrenal gland: Secondary | ICD-10-CM | POA: Diagnosis present

## 2017-05-09 DIAGNOSIS — Z79899 Other long term (current) drug therapy: Secondary | ICD-10-CM

## 2017-05-09 DIAGNOSIS — Z923 Personal history of irradiation: Secondary | ICD-10-CM | POA: Diagnosis not present

## 2017-05-09 DIAGNOSIS — C3491 Malignant neoplasm of unspecified part of right bronchus or lung: Secondary | ICD-10-CM | POA: Diagnosis present

## 2017-05-09 DIAGNOSIS — J189 Pneumonia, unspecified organism: Secondary | ICD-10-CM

## 2017-05-09 DIAGNOSIS — K219 Gastro-esophageal reflux disease without esophagitis: Secondary | ICD-10-CM | POA: Diagnosis present

## 2017-05-09 DIAGNOSIS — D899 Disorder involving the immune mechanism, unspecified: Secondary | ICD-10-CM | POA: Diagnosis not present

## 2017-05-09 DIAGNOSIS — J40 Bronchitis, not specified as acute or chronic: Secondary | ICD-10-CM | POA: Diagnosis present

## 2017-05-09 DIAGNOSIS — J210 Acute bronchiolitis due to respiratory syncytial virus: Secondary | ICD-10-CM | POA: Diagnosis not present

## 2017-05-09 DIAGNOSIS — E785 Hyperlipidemia, unspecified: Secondary | ICD-10-CM | POA: Diagnosis present

## 2017-05-09 DIAGNOSIS — J209 Acute bronchitis, unspecified: Secondary | ICD-10-CM | POA: Diagnosis not present

## 2017-05-09 LAB — COMPREHENSIVE METABOLIC PANEL
ALK PHOS: 78 U/L (ref 38–126)
ALT: 39 U/L (ref 17–63)
AST: 46 U/L — AB (ref 15–41)
Albumin: 3.5 g/dL (ref 3.5–5.0)
Anion gap: 13 (ref 5–15)
BUN: 22 mg/dL — AB (ref 6–20)
CHLORIDE: 100 mmol/L — AB (ref 101–111)
CO2: 17 mmol/L — ABNORMAL LOW (ref 22–32)
CREATININE: 1.28 mg/dL — AB (ref 0.61–1.24)
Calcium: 7.7 mg/dL — ABNORMAL LOW (ref 8.9–10.3)
GFR calc Af Amer: >60 mL/min (ref >60)
GFR, EST NON AFRICAN AMERICAN: 52 mL/min — AB (ref >60)
Glucose, Bld: 170 mg/dL — ABNORMAL HIGH (ref 65–99)
Potassium: 4.3 mmol/L (ref 3.5–5.1)
Sodium: 130 mmol/L — ABNORMAL LOW (ref 135–145)
Total Bilirubin: 0.8 mg/dL (ref 0.3–1.2)
Total Protein: 7.5 g/dL (ref 6.5–8.1)

## 2017-05-09 LAB — URINALYSIS, ROUTINE W REFLEX MICROSCOPIC
Bacteria, UA: NONE SEEN
Bilirubin Urine: NEGATIVE
Glucose, UA: NEGATIVE mg/dL
Ketones, ur: NEGATIVE mg/dL
Leukocytes, UA: NEGATIVE
Nitrite: NEGATIVE
Protein, ur: NEGATIVE mg/dL
SPECIFIC GRAVITY, URINE: 1.013 (ref 1.005–1.030)
SQUAMOUS EPITHELIAL / LPF: NONE SEEN
pH: 5 (ref 5.0–8.0)

## 2017-05-09 LAB — I-STAT CG4 LACTIC ACID, ED: LACTIC ACID, VENOUS: 1.76 mmol/L (ref 0.5–1.9)

## 2017-05-09 LAB — CBC WITH DIFFERENTIAL/PLATELET
BASOS PCT: 0 %
Basophils Absolute: 0 10*3/uL (ref 0.0–0.1)
EOS PCT: 0 %
Eosinophils Absolute: 0 10*3/uL (ref 0.0–0.7)
HCT: 24.6 % — ABNORMAL LOW (ref 39.0–52.0)
Hemoglobin: 8.7 g/dL — ABNORMAL LOW (ref 13.0–17.0)
LYMPHS ABS: 0.4 10*3/uL — AB (ref 0.7–4.0)
Lymphocytes Relative: 11 %
MCH: 35.2 pg — AB (ref 26.0–34.0)
MCHC: 35.4 g/dL (ref 30.0–36.0)
MCV: 99.6 fL (ref 78.0–100.0)
MONO ABS: 1.1 10*3/uL — AB (ref 0.1–1.0)
Monocytes Relative: 32 %
Neutro Abs: 1.8 10*3/uL (ref 1.7–7.7)
Neutrophils Relative %: 57 %
PLATELETS: 52 10*3/uL — AB (ref 150–400)
RBC: 2.47 MIL/uL — ABNORMAL LOW (ref 4.22–5.81)
RDW: 16.9 % — AB (ref 11.5–15.5)
WBC: 3.3 10*3/uL — AB (ref 4.0–10.5)

## 2017-05-09 LAB — BLOOD GAS, VENOUS
ACID-BASE DEFICIT: 4.5 mmol/L — AB (ref 0.0–2.0)
BICARBONATE: 19.2 mmol/L — AB (ref 20.0–28.0)
O2 Saturation: 49.2 %
PATIENT TEMPERATURE: 98.6
pCO2, Ven: 32.7 mmHg — ABNORMAL LOW (ref 44.0–60.0)
pH, Ven: 7.386 (ref 7.250–7.430)

## 2017-05-09 LAB — PROTIME-INR
INR: 1.21
PROTHROMBIN TIME: 15.2 s (ref 11.4–15.2)

## 2017-05-09 LAB — INFLUENZA PANEL BY PCR (TYPE A & B)
Influenza A By PCR: NEGATIVE
Influenza B By PCR: NEGATIVE

## 2017-05-09 LAB — I-STAT TROPONIN, ED: TROPONIN I, POC: 0.02 ng/mL (ref 0.00–0.08)

## 2017-05-09 LAB — PROCALCITONIN: Procalcitonin: 0.76 ng/mL

## 2017-05-09 LAB — LACTIC ACID, PLASMA: LACTIC ACID, VENOUS: 1.3 mmol/L (ref 0.5–1.9)

## 2017-05-09 LAB — APTT: APTT: 39 s — AB (ref 24–36)

## 2017-05-09 MED ORDER — SODIUM CHLORIDE 0.9 % IV BOLUS (SEPSIS)
2000.0000 mL | Freq: Once | INTRAVENOUS | Status: AC
  Administered 2017-05-09: 2000 mL via INTRAVENOUS

## 2017-05-09 MED ORDER — PREDNISONE 5 MG PO TABS
5.0000 mg | ORAL_TABLET | Freq: Two times a day (BID) | ORAL | Status: DC
  Administered 2017-05-10: 5 mg via ORAL
  Filled 2017-05-09: qty 1

## 2017-05-09 MED ORDER — TAMSULOSIN HCL 0.4 MG PO CAPS
0.4000 mg | ORAL_CAPSULE | Freq: Every morning | ORAL | Status: DC
  Administered 2017-05-10 – 2017-05-12 (×3): 0.4 mg via ORAL
  Filled 2017-05-09 (×3): qty 1

## 2017-05-09 MED ORDER — VANCOMYCIN HCL IN DEXTROSE 1-5 GM/200ML-% IV SOLN
1000.0000 mg | Freq: Once | INTRAVENOUS | Status: AC
  Administered 2017-05-09: 1000 mg via INTRAVENOUS
  Filled 2017-05-09: qty 200

## 2017-05-09 MED ORDER — MAGIC MOUTHWASH W/LIDOCAINE
5.0000 mL | Freq: Four times a day (QID) | ORAL | Status: DC | PRN
  Administered 2017-05-11: 5 mL via ORAL
  Filled 2017-05-09 (×2): qty 5

## 2017-05-09 MED ORDER — DM-GUAIFENESIN ER 30-600 MG PO TB12: 1.0000 | ORAL_TABLET | Freq: Two times a day (BID) | ORAL | Status: DC

## 2017-05-09 MED ORDER — LEVALBUTEROL HCL 1.25 MG/0.5ML IN NEBU: 1.2500 mg | INHALATION_SOLUTION | Freq: Three times a day (TID) | RESPIRATORY_TRACT | Status: DC

## 2017-05-09 MED ORDER — IOPAMIDOL (ISOVUE-370) INJECTION 76%
100.0000 mL | Freq: Once | INTRAVENOUS | Status: AC | PRN
  Administered 2017-05-09: 100 mL via INTRAVENOUS

## 2017-05-09 MED ORDER — VANCOMYCIN HCL 10 G IV SOLR
1250.0000 mg | INTRAVENOUS | Status: DC
  Administered 2017-05-10 – 2017-05-12 (×3): 1250 mg via INTRAVENOUS
  Filled 2017-05-09 (×4): qty 1250

## 2017-05-09 MED ORDER — SODIUM CHLORIDE 0.9 % IV SOLN
INTRAVENOUS | Status: AC
  Administered 2017-05-10: via INTRAVENOUS

## 2017-05-09 MED ORDER — IOPAMIDOL (ISOVUE-370) INJECTION 76%
INTRAVENOUS | Status: AC
  Filled 2017-05-09: qty 100

## 2017-05-09 MED ORDER — BENZONATATE 100 MG PO CAPS: 100.0000 mg | ORAL_CAPSULE | Freq: Three times a day (TID) | ORAL | Status: DC | PRN

## 2017-05-09 MED ORDER — IPRATROPIUM BROMIDE 0.02 % IN SOLN
0.5000 mg | Freq: Four times a day (QID) | RESPIRATORY_TRACT | Status: DC
  Administered 2017-05-09 – 2017-05-10 (×3): 0.5 mg via RESPIRATORY_TRACT
  Filled 2017-05-09 (×2): qty 2.5

## 2017-05-09 MED ORDER — FOLIC ACID 1 MG PO TABS
1.0000 mg | ORAL_TABLET | Freq: Every day | ORAL | Status: DC
  Administered 2017-05-10 – 2017-05-12 (×4): 1 mg via ORAL
  Filled 2017-05-09 (×4): qty 1

## 2017-05-09 MED ORDER — IPRATROPIUM BROMIDE 0.02 % IN SOLN: 0.5000 mg | Freq: Three times a day (TID) | RESPIRATORY_TRACT | Status: DC

## 2017-05-09 MED ORDER — LIDOCAINE-PRILOCAINE 2.5-2.5 % EX CREA
1.0000 "application " | TOPICAL_CREAM | CUTANEOUS | Status: DC | PRN
  Filled 2017-05-09: qty 5

## 2017-05-09 MED ORDER — TRIAMCINOLONE ACETONIDE 55 MCG/ACT NA AERO
1.0000 | INHALATION_SPRAY | Freq: Every day | NASAL | Status: DC
  Administered 2017-05-10 – 2017-05-11 (×3): 1 via NASAL
  Filled 2017-05-09: qty 10.8

## 2017-05-09 MED ORDER — CEFEPIME HCL 1 G IJ SOLR
1.0000 g | Freq: Two times a day (BID) | INTRAMUSCULAR | Status: DC
  Administered 2017-05-10 – 2017-05-11 (×4): 1 g via INTRAVENOUS
  Filled 2017-05-09 (×6): qty 1

## 2017-05-09 MED ORDER — GUAIFENESIN ER 600 MG PO TB12
600.0000 mg | ORAL_TABLET | Freq: Two times a day (BID) | ORAL | Status: DC
  Administered 2017-05-10 – 2017-05-12 (×6): 600 mg via ORAL
  Filled 2017-05-09 (×6): qty 1

## 2017-05-09 MED ORDER — PANTOPRAZOLE SODIUM 40 MG PO TBEC
40.0000 mg | DELAYED_RELEASE_TABLET | Freq: Every day | ORAL | Status: DC
  Administered 2017-05-10 – 2017-05-12 (×3): 40 mg via ORAL
  Filled 2017-05-09 (×3): qty 1

## 2017-05-09 MED ORDER — LEVALBUTEROL HCL 1.25 MG/0.5ML IN NEBU
1.2500 mg | INHALATION_SOLUTION | Freq: Four times a day (QID) | RESPIRATORY_TRACT | Status: DC
  Administered 2017-05-09 – 2017-05-10 (×3): 1.25 mg via RESPIRATORY_TRACT
  Filled 2017-05-09 (×3): qty 0.5

## 2017-05-09 MED ORDER — DEXTROSE 5 % IV SOLN
2.0000 g | Freq: Once | INTRAVENOUS | Status: AC
  Administered 2017-05-09: 2 g via INTRAVENOUS
  Filled 2017-05-09: qty 2

## 2017-05-09 NOTE — ED Provider Notes (Signed)
[facility] DEPT Provider Note   CSN: [id number] Arrival date & time: 05/09/17  1110     History   Chief Complaint Chief Complaint  Patient presents with  . Pneumonia  . Shortness of Breath  . Fever    HPI Mark Howell is a 77 y.o. male.  HPI  With 77 year old with history of metastatic non-small cell lung cancer on chemotherapy comes in with chief complaint of fevers, weakness, shortness of breath. Patient reports that he has had upper respiratory infection-like symptoms for the past 4 weeks.  After Christmas patient started developing a cough, which is mostly nonproductive.  After New Year's patient has been having worsening shortness of breath with exertion and weakness.  Recently patient is started developing fever, T-max was 100.8.  Patient was seen by PCP and started on pneumonia medications, but pt is getting worse.  Last chemo session was about a week ago.  Past Medical History:  Diagnosis Date  . Arthritis   . Complication of anesthesia    problems voiding post op  . Full dentures   . GERD (gastroesophageal reflux disease)   . History of radiation therapy 01/25/16-02/28/16   right lung 45 Gy  . History of radiation therapy 04/17/16-05/02/16   right lung boost 20 Gy in 10 fractions cumulative dose 65 gray  . HOH (hard of hearing)   . HTN (hypertension)   . Hyperlipidemia   . Lung mass 12/30/2015  . Odynophagia 04/06/2016  . Psoriatic arthritis ([place])   . Snores   . Wears glasses     Patient Active Problem List   Diagnosis Date Noted  . Sepsis ([place]) 05/09/2017  . Anxiety 04/11/2017  . Port-A-Cath in place 02/19/2017  . 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 Goals of care, counseling/discussion 12/27/2016  . Odynophagia 04/06/2016  . Antineoplastic chemotherapy induced anemia 04/06/2016  . 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 Adenocarcinoma of right lung, stage 2 ([place]) 01/13/2016  . Encounter for  antineoplastic chemotherapy 01/13/2016  . Snoring 03/19/2012    Past Surgical History:  Procedure Laterality Date  . COLONOSCOPY    . HEMORRHOID SURGERY N/A 10/01/2013   Procedure: EXAM UNDER ANESTHESIA  AND EXCISIONAL HEMORRHOIDECTOMY WITH HEMORRHOID BANDING X 2;  Surgeon: Gayland Curry, MD;  Location: [facility];  Service: General;  Laterality: N/A;  . INGUINAL HERNIA REPAIR  01/04/2012   Procedure: LAPAROSCOPIC INGUINAL HERNIA;  Surgeon: Gayland Curry, MD,FACS;  Location: [facility];  Service: General;  Laterality: Right;  . IR FLUORO GUIDE PORT INSERTION RIGHT  01/02/2017  . IR US GUIDE VASC ACCESS RIGHT  01/02/2017  . VIDEO BRONCHOSCOPY WITH ENDOBRONCHIAL ULTRASOUND N/A 12/31/2015   Procedure: VIDEO BRONCHOSCOPY WITH ENDOBRONCHIAL ULTRASOUND transbronchial biopsy of node 10 R lymph node;  Surgeon: Grace Isaac, MD;  Location: [facility];  Service: Thoracic;  Laterality: N/A;       Home Medications    Prior to Admission medications   Medication Sig Start Date End Date Taking? Authorizing Provider  acetaminophen (TYLENOL) 500 MG tablet Take 500-1,000 mg by mouth 2 (two) times daily as needed for mild pain.   Yes [provider]  albuterol (VENTOLIN HFA) 108 (90 Base) MCG/ACT inhaler Inhale into the lungs. 02/20/17  Yes [provider]  aspirin EC 81 MG tablet Take 81 mg by mouth at bedtime.    Yes [provider]  aspirin-sod bicarb-citric acid (ALKA-SELTZER) 325 MG TBEF tablet Take 650 mg by  mouth daily as needed (indigestion).    Yes [provider]  benzonatate (TESSALON) 100 MG capsule Take 2 capsules (200 mg total) by mouth 3 (three) times daily as needed for cough. 05/07/17  Yes Tanner, Lyndon Code., PA-C  calcium carbonate (TUMS - DOSED IN MG ELEMENTAL CALCIUM) 500 MG chewable tablet Chew 1 tablet by mouth daily.   Yes [provider]  Cetirizine HCl 10 MG TBDP Take by mouth. 02/26/17  Yes [provider]  Coenzyme Q10 (CO Q 10  PO) Take 1 tablet by mouth daily.   Yes [provider]  dexamethasone (DECADRON) 4 MG tablet 4 mg by mouth twice a day the day before, day of and day after chemotherapy every 3 weeks. 12/27/16  Yes Curt Bears, MD  diphenhydramine-acetaminophen (TYLENOL PM) 25-500 MG TABS Take 1 tablet by mouth at bedtime. For sleep   Yes [provider]  diphenoxylate-atropine (LOMOTIL) 2.5-0.025 MG tablet Take 2 tablets by mouth 4 (four) times daily as needed for diarrhea or loose stools. 05/07/17  Yes Tanner, Lyndon Code., PA-C  folic acid (FOLVITE) 1 MG tablet Take 1 tablet (1 mg total) by mouth daily. 12/27/16  Yes Curt Bears, MD  levofloxacin (LEVAQUIN) 500 MG tablet Take 1 tablet (500 mg total) by mouth daily. 05/07/17  Yes Tanner, Lyndon Code., PA-C  lidocaine (XYLOCAINE) 2 % solution Use as directed 20 mLs in the mouth or throat every 3 (three) hours as needed for mouth pain. 01/22/17  Yes Tanner, Lyndon Code., PA-C  lidocaine-prilocaine (EMLA) cream Apply 1 application topically as needed. Patient taking differently: Apply 1 application topically as needed (port access).  12/27/16  Yes Curt Bears, MD  loperamide (IMODIUM A-D) 2 MG tablet Take 2 mg by mouth daily as needed for diarrhea or loose stools.   Yes [provider]  LORazepam (ATIVAN) 0.5 MG tablet Take 1 tablet (0.5 mg total) by mouth 2 (two) times daily as needed for anxiety. 04/11/17  Yes Curcio, Roselie Awkward, NP  Lutein 10 MG TABS Take 10 mg by mouth at bedtime.    Yes [provider]  magic mouthwash SOLN Take 5 mLs by mouth 4 (four) times daily as needed for mouth pain. Patient taking differently: Take 5 mLs by mouth 4 (four) times daily as needed for mouth pain. SWISH AND SPIT OUT 01/22/17  Yes Tanner, Lyndon Code., PA-C  Methylcobalamin (METHYL B-12 PO) Take 1 tablet by mouth daily.   Yes [provider]  Multiple Vitamin (MULITIVITAMIN WITH MINERALS) TABS Take 1 tablet by mouth daily.   Yes [provider]   nystatin (MYCOSTATIN/NYSTOP) powder Apply topically 3 (three) times daily. Apply to groin rash. 02/02/17  Yes Curt Bears, MD  pantoprazole (PROTONIX) 40 MG tablet Take 40 mg by mouth daily.    Yes [provider]  pravastatin (PRAVACHOL) 40 MG tablet Take 40 mg by mouth at bedtime.  10/17/13  Yes [provider]  predniSONE (DELTASONE) 10 MG tablet Take 10 mg by mouth 2 (two) times daily as needed. 04/16/17  Yes [provider]  predniSONE (DELTASONE) 5 MG tablet Take 5 mg by mouth 2 (two) times daily with a meal.   Yes [provider]  prochlorperazine (COMPAZINE) 10 MG tablet Take 1 tablet (10 mg total) by mouth every 6 (six) hours as needed for nausea or vomiting. 12/27/16  Yes Curt Bears, MD  tamsulosin (FLOMAX) 0.4 MG CAPS capsule TAKE (1) CAPSULE DAILY, START 4 DAYS BEFORE PROCEDURE Patient taking differently:  take 1 capsule by mouth every morning 11/20/13  Yes Greer Pickerel, MD  triamcinolone (NASACORT) 55 MCG/ACT AERO nasal inhaler Place 1 spray into the nose at bedtime.  08/30/16 08/30/17 Yes [provider]  Etanercept 25 MG/0.5ML SOSY Inject into the skin.    [provider]  predniSONE (DELTASONE) 10 MG tablet Take 5 mg by mouth daily.     [provider]    Family History Family History  Problem Relation Age of Onset  . Stroke Father   . Diabetes Father   . Heart disease Father   . Cancer Brother        pancreatic    Social History Social History   Tobacco Use  . Smoking status: Former Smoker    Packs/day: 0.50    Years: 50.00    Pack years: 25.00    Types: Cigarettes    Last attempt to quit: 11/30/2015    Years since quitting: 1.4  . Smokeless tobacco: Never Used  Substance Use Topics  . Alcohol use: Yes    Comment: occassionally  . Drug use: No     Allergies   Hydrocodone and Oxycodone   Review of Systems Review of Systems  Constitutional: Positive for activity change.  Respiratory:  Positive for shortness of breath.   Cardiovascular: Positive for chest pain.  Neurological: Positive for weakness.  All other systems reviewed and are negative.    Physical Exam Updated Vital Signs BP 117/70   Pulse (!) 117   Temp 98.7 F (37.1 C) (Oral)   Resp (!) 36   Ht 6\' 1"  (1.854 m)   Wt 79.4 kg (175 lb)   SpO2 98%   BMI 23.09 kg/m   Physical Exam  Constitutional: He is oriented to person, place, and time. He appears well-developed.  HENT:  Head: Atraumatic.  Neck: Neck supple.  Cardiovascular:  tachycardia  Pulmonary/Chest: Effort normal. He has rhonchi in the right upper field and the right middle field.  Neurological: He is alert and oriented to person, place, and time.  Skin: Skin is warm.  Nursing note and vitals reviewed.    ED Treatments / Results  Labs (all labs ordered are listed, but only abnormal results are displayed) Labs Reviewed  COMPREHENSIVE METABOLIC PANEL - Abnormal; Notable for the following components:      Result Value   Sodium 130 (*)    Chloride 100 (*)    CO2 17 (*)    Glucose, Bld 170 (*)    BUN 22 (*)    Creatinine, Ser 1.28 (*)    Calcium 7.7 (*)    AST 46 (*)    GFR calc non Af Amer 52 (*)    All other components within normal limits  CBC WITH DIFFERENTIAL/PLATELET - Abnormal; Notable for the following components:   WBC 3.3 (*)    RBC 2.47 (*)    Hemoglobin 8.7 (*)    HCT 24.6 (*)    MCH 35.2 (*)    RDW 16.9 (*)    Platelets 52 (*)    Lymphs Abs 0.4 (*)    Monocytes Absolute 1.1 (*)    All other components within normal limits  URINALYSIS, ROUTINE W REFLEX MICROSCOPIC - Abnormal; Notable for the following components:   Hgb urine dipstick SMALL (*)    All other components within normal limits  APTT - Abnormal; Notable for the following components:   aPTT 39 (*)    All other components within normal limits  BLOOD GAS,  VENOUS - Abnormal; Notable for the following components:   pCO2, Ven 32.7 (*)    Bicarbonate 19.2  (*)    Acid-base deficit 4.5 (*)    All other components within normal limits  CULTURE, BLOOD (ROUTINE X 2)  CULTURE, BLOOD (ROUTINE X 2)  MRSA PCR SCREENING  RESPIRATORY PANEL BY PCR  CULTURE, EXPECTORATED SPUTUM-ASSESSMENT  PROTIME-INR  INFLUENZA PANEL BY PCR (TYPE A & B)  I-STAT CG4 LACTIC ACID, ED  I-STAT VENOUS BLOOD GAS, ED  I-STAT TROPONIN, ED    EKG  EKG Interpretation  Date/Time:  Wednesday May 09 2017 12:48:05 EST Ventricular Rate:  120 PR Interval:    QRS Duration: 93 QT Interval:  314 QTC Calculation: 444 R Axis:   11 Text Interpretation:  Sinus tachycardia No acute changes No significant change since last tracing Confirmed by Varney Biles ([id number]) on 05/09/2017 1:06:56 PM       Radiology Dg Chest 2 View  Result Date: 05/09/2017 CLINICAL DATA:  Cough and fever.  Lung cancer. EXAM: CHEST  2 VIEW COMPARISON:  05/07/2017 FINDINGS: Progressive enlargement of the right hilum. Given the time frame, this is most likely pneumonia or radiation pneumonitis rather than tumor progression. Left lung remains clear. No heart failure or effusion. Port-A-Cath tip in the lower SVC unchanged. IMPRESSION: Progressive enlargement of the right hilum which may be due to pneumonia or radiation change. Electronically Signed   By: Franchot Gallo M.D.   On: 05/09/2017 15:35    Procedures Procedures (including critical care time)  CRITICAL CARE Performed by: Jorgen Wolfinger   Total critical care time: 48 minutes  Critical care time was exclusive of separately billable procedures and treating other patients.  Critical care was necessary to treat or prevent imminent or life-threatening deterioration.  Critical care was time spent personally by me on the following activities: development of treatment plan with patient and/or surrogate as well as nursing, discussions with consultants, evaluation of patient's response to treatment, examination of patient, obtaining history from patient  or surrogate, ordering and performing treatments and interventions, ordering and review of laboratory studies, ordering and review of radiographic studies, pulse oximetry and re-evaluation of patient's condition.   Medications Ordered in ED Medications  sodium chloride 0.9 % bolus 2,000 mL (0 mLs Intravenous Stopped 05/09/17 1457)  ceFEPIme (MAXIPIME) 2 g in dextrose 5 % 50 mL IVPB (0 g Intravenous Stopped 05/09/17 1330)  vancomycin (VANCOCIN) IVPB 1000 mg/200 mL premix (0 mg Intravenous Stopped 05/09/17 1406)     Initial Impression / Assessment and Plan / ED Course  I have reviewed the triage vital signs and the nursing notes.  Pertinent labs & imaging results that were available during my care of the patient were reviewed by me and considered in my medical decision making (see chart for details).  Clinical Course as of May 09 1712  Wed May 09, 2017  1712 Patient is requesting BiPAP -mainly for the work of breathing.  Spoke with the admitting team, they would appreciate a CT scan given the possibility of PE and postobstructive pneumonia.  CT PE ordered at this time, Dr. Erlinda Hong you will follow-up  [AN]    Clinical Course User Index [AN] Varney Biles, MD   Patient comes in with chief complaint of fevers, cough, shortness of breath.  Patient has known history of non-small cell carcinoma of the lung, on chemotherapy.  On exam patient has rhonchorous breath sounds on the right side of the lungs, however that is the same side  as a cancer.  We will initiate the sepsis workup given patient's tachycardia and tachypnea.  Differential diagnosis outside of pneumonia includes postobstructive -pneumonitis, pleural effusions, PE, influenza.  Final Clinical Impressions(s) / ED Diagnoses   Final diagnoses:  Community acquired pneumonia of right lung, unspecified part of lung  Malignant neoplasm of right lung, unspecified part of lung ([place])  Tachycardia    ED Discharge Orders    None       Varney Biles, MD 05/09/17 1714

## 2017-05-09 NOTE — ED Notes (Signed)
Patient transported to CT 

## 2017-05-09 NOTE — Progress Notes (Signed)
Pharmacy Antibiotic Note  Mark Howell is a 77 y.o. male admitted on 05/09/2017 with sepsis.  Pharmacy has been consulted for vancomycin and cefepime dosing.  Patient received vancomycin 1g and cefepime 2g one time doses in the ED.  Plan: Vancomycin 1250 mg IV q24h for AUC goal 400-500. Cefepime 1g IV q12h for CrCl<60. Daily SCr.  Height: 6\' 1"  (185.4 cm) Weight: 175 lb (79.4 kg) IBW/kg (Calculated) : 79.9  Temp (24hrs), Avg:98.7 F (37.1 C), Min:98.7 F (37.1 C), Max:98.7 F (37.1 C)  Recent Labs  Lab 05/09/17 1217 05/09/17 1225  WBC 3.3*  --   CREATININE 1.28*  --   LATICACIDVEN  --  1.76    Estimated Creatinine Clearance: 54.3 mL/min (A) (by C-G formula based on SCr of 1.28 mg/dL (H)).    Allergies  Allergen Reactions  . Hydrocodone Rash  . Oxycodone Rash    Antimicrobials this admission: 1/9 vancomycin >>  1/9 cefepime >>   Dose adjustments this admission:  Microbiology results: 1/9 BCx:   Thank you for allowing pharmacy to be a part of this patient's care.  Hershal Coria 05/09/2017 5:43 PM

## 2017-05-09 NOTE — Telephone Encounter (Signed)
Temp 100.8, cough is worse and rapid breathing. Pneumonia dx 05/07/17 ,  Levaquin yesterday after receiving IV antibiotic here. Per Julien Nordmann pt needs to go to ED -wife notified.

## 2017-05-09 NOTE — ED Triage Notes (Signed)
Pt resides at home.Lung Cancer. Dx pneumonia on Monday. Antibiotic tx started. Shortness of breath and fever.

## 2017-05-09 NOTE — ED Notes (Signed)
Patient transported to X-ray 

## 2017-05-09 NOTE — ED Notes (Signed)
FAMILY AND PT STATES EDP STATES PT CAN EAT. EDP NANAVATI PT CAN EAT. PT WILL GO ON BIPAP FOR WORK OF BREATHING. THIS WRITER VERIFIED ORDER. RRT CALLED. DEE RESPONDED. AWARE OF ORDER FOR BIPAP. FEEDING PT AT PRESENT BEFORE BIPAP STARTED.

## 2017-05-09 NOTE — H&P (Signed)
History and Physical  CHESLEY VEASEY GTX:[id number] DOB: 1941-04-30 DOA: 05/09/2017  Referring physician: EDP PCP: [name], MD   Chief Complaint: sob, tachycardia, fever, cough  HPI: Mark Howell is a 77 y.o. male   Metastatic non-small cell lung cancer, last chemo on 12/31 with  Pemetrexad, carboplatin and avastin, he started to have nonproductive cough two weeks ago, he was diagnosed with left lower lobe pneumonia at oncology clinic on 1/7 and was treated with levaquin. He did not get better, has become progressive sob, not able to walk without being sob. He denies chest pain, reports cough is nonproductive. He report fever of 100.8 at home.   ED Course: He has no fever no hypoxia in the ED he does has tachypnea respiration rate ranged from 26-36 he also has sinus tachycardia heart rate ranged from 114-124, blood pressure stable. WBC 3.3 with toxic granulation, hemoglobin 8.7 platelet 52, sodium 130 BUN 22 creatinine 1.28.  Lactic acid 1.76, clonidine 0.02.   UA no acute findings.  EKg sinus tachycardia,  cxr in the ED "Progressive enlargement of the right hilum. Given the time frame, this is most likely pneumonia or radiation pneumonitis rather than tumor progression. Left lung remains clear. No heart failure or effusion. Port-A-Cath tip in the lower SVC unchanged"   Blood culture obtained.  He received NS at 30cc/kgx1,  Started on Vanco and cefepime in the ED. hospitalist called to admit the patient.   Review of Systems:  Detail per HPI, Review of systems are otherwise negative  Past Medical History:  Diagnosis Date  . Arthritis   . Complication of anesthesia    problems voiding post op  . Full dentures   . GERD (gastroesophageal reflux disease)   . History of radiation therapy 01/25/16-02/28/16   right lung 45 Gy  . History of radiation therapy 04/17/16-05/02/16   right lung boost 20 Gy in 10 fractions cumulative dose 65 gray  . HOH (hard of hearing)   . HTN  (hypertension)   . Hyperlipidemia   . Lung mass 12/30/2015  . Odynophagia 04/06/2016  . Psoriatic arthritis ([place])   . Snores   . Wears glasses    Past Surgical History:  Procedure Laterality Date  . COLONOSCOPY    . HEMORRHOID SURGERY N/A 10/01/2013   Procedure: EXAM UNDER ANESTHESIA  AND EXCISIONAL HEMORRHOIDECTOMY WITH HEMORRHOID BANDING X 2;  Surgeon: Gayland Curry, MD;  Location: [facility];  Service: General;  Laterality: N/A;  . INGUINAL HERNIA REPAIR  01/04/2012   Procedure: LAPAROSCOPIC INGUINAL HERNIA;  Surgeon: Gayland Curry, MD,FACS;  Location: [facility];  Service: General;  Laterality: Right;  . IR FLUORO GUIDE PORT INSERTION RIGHT  01/02/2017  . IR US GUIDE VASC ACCESS RIGHT  01/02/2017  . VIDEO BRONCHOSCOPY WITH ENDOBRONCHIAL ULTRASOUND N/A 12/31/2015   Procedure: VIDEO BRONCHOSCOPY WITH ENDOBRONCHIAL ULTRASOUND transbronchial biopsy of node 10 R lymph node;  Surgeon: Grace Isaac, MD;  Location: [facility];  Service: Thoracic;  Laterality: N/A;   Social History:  reports that he quit smoking about 17 months ago. His smoking use included cigarettes. He has a 25.00 pack-year smoking history. he has never used smokeless tobacco. He reports that he drinks alcohol. He reports that he does not use drugs. Patient lives at home & is able to participate in activities of daily living independently   Allergies  Allergen Reactions  . Hydrocodone Rash  . Oxycodone Rash    Family History  Problem Relation Age  of Onset  . Stroke Father   . Diabetes Father   . Heart disease Father   . Cancer Brother        pancreatic      Prior to Admission medications   Medication Sig Start Date End Date Taking? Authorizing Provider  acetaminophen (TYLENOL) 500 MG tablet Take 500-1,000 mg by mouth 2 (two) times daily as needed for mild pain.   Yes [provider]  albuterol (VENTOLIN HFA) 108 (90 Base) MCG/ACT inhaler Inhale into the lungs. 02/20/17  Yes [provider]    aspirin EC 81 MG tablet Take 81 mg by mouth at bedtime.    Yes [provider]  aspirin-sod bicarb-citric acid (ALKA-SELTZER) 325 MG TBEF tablet Take 650 mg by mouth daily as needed (indigestion).    Yes [provider]  benzonatate (TESSALON) 100 MG capsule Take 2 capsules (200 mg total) by mouth 3 (three) times daily as needed for cough. 05/07/17  Yes Tanner, Lyndon Code., PA-C  calcium carbonate (TUMS - DOSED IN MG ELEMENTAL CALCIUM) 500 MG chewable tablet Chew 1 tablet by mouth daily.   Yes [provider]  Cetirizine HCl 10 MG TBDP Take by mouth. 02/26/17  Yes [provider]  Coenzyme Q10 (CO Q 10 PO) Take 1 tablet by mouth daily.   Yes [provider]  dexamethasone (DECADRON) 4 MG tablet 4 mg by mouth twice a day the day before, day of and day after chemotherapy every 3 weeks. 12/27/16  Yes Curt Bears, MD  diphenhydramine-acetaminophen (TYLENOL PM) 25-500 MG TABS Take 1 tablet by mouth at bedtime. For sleep   Yes [provider]  diphenoxylate-atropine (LOMOTIL) 2.5-0.025 MG tablet Take 2 tablets by mouth 4 (four) times daily as needed for diarrhea or loose stools. 05/07/17  Yes Tanner, Lyndon Code., PA-C  folic acid (FOLVITE) 1 MG tablet Take 1 tablet (1 mg total) by mouth daily. 12/27/16  Yes Curt Bears, MD  levofloxacin (LEVAQUIN) 500 MG tablet Take 1 tablet (500 mg total) by mouth daily. 05/07/17  Yes Tanner, Lyndon Code., PA-C  lidocaine (XYLOCAINE) 2 % solution Use as directed 20 mLs in the mouth or throat every 3 (three) hours as needed for mouth pain. 01/22/17  Yes Tanner, Lyndon Code., PA-C  lidocaine-prilocaine (EMLA) cream Apply 1 application topically as needed. Patient taking differently: Apply 1 application topically as needed (port access).  12/27/16  Yes Curt Bears, MD  loperamide (IMODIUM A-D) 2 MG tablet Take 2 mg by mouth daily as needed for diarrhea or loose stools.   Yes [provider]  LORazepam (ATIVAN) 0.5 MG tablet Take  1 tablet (0.5 mg total) by mouth 2 (two) times daily as needed for anxiety. 04/11/17  Yes Curcio, Roselie Awkward, NP  Lutein 10 MG TABS Take 10 mg by mouth at bedtime.    Yes [provider]  magic mouthwash SOLN Take 5 mLs by mouth 4 (four) times daily as needed for mouth pain. Patient taking differently: Take 5 mLs by mouth 4 (four) times daily as needed for mouth pain. SWISH AND SPIT OUT 01/22/17  Yes Tanner, Lyndon Code., PA-C  Methylcobalamin (METHYL B-12 PO) Take 1 tablet by mouth daily.   Yes [provider]  Multiple Vitamin (MULITIVITAMIN WITH MINERALS) TABS Take 1 tablet by mouth daily.   Yes [provider]  nystatin (MYCOSTATIN/NYSTOP) powder Apply topically 3 (three) times daily. Apply to groin rash. 02/02/17  Yes Curt Bears, MD  pantoprazole (PROTONIX) 40 MG tablet  Take 40 mg by mouth daily.    Yes [provider]  pravastatin (PRAVACHOL) 40 MG tablet Take 40 mg by mouth at bedtime.  10/17/13  Yes [provider]  predniSONE (DELTASONE) 10 MG tablet Take 10 mg by mouth 2 (two) times daily as needed. 04/16/17  Yes [provider]  predniSONE (DELTASONE) 5 MG tablet Take 5 mg by mouth 2 (two) times daily with a meal.   Yes [provider]  prochlorperazine (COMPAZINE) 10 MG tablet Take 1 tablet (10 mg total) by mouth every 6 (six) hours as needed for nausea or vomiting. 12/27/16  Yes Curt Bears, MD  tamsulosin (FLOMAX) 0.4 MG CAPS capsule TAKE (1) CAPSULE DAILY, START 4 DAYS BEFORE PROCEDURE Patient taking differently: take 1 capsule by mouth every morning 11/20/13  Yes Greer Pickerel, MD  triamcinolone (NASACORT) 55 MCG/ACT AERO nasal inhaler Place 1 spray into the nose at bedtime.  08/30/16 08/30/17 Yes [provider]  Etanercept 25 MG/0.5ML SOSY Inject into the skin.    [provider]  predniSONE (DELTASONE) 10 MG tablet Take 5 mg by mouth daily.     [provider]    Physical Exam: BP 117/70    Pulse (!) 117   Temp 98.7 F (37.1 C) (Oral)   Resp (!) 36   Ht 6\' 1"  (1.854 m)   Wt 79.4 kg (175 lb)   SpO2 98%   BMI 23.09 kg/m   General:  Pale, look sick Eyes: PERRL ENT: unremarkable Neck: supple, no JVD Cardiovascular: tachyccardia Respiratory: + rhonchi, scattered intermittent wheezing Abdomen: soft/NT/ND, positive bowel sounds Skin: no rash Musculoskeletal:  No edema Psychiatric: calm/cooperative Neurologic: no focal findings            Labs on Admission:  Basic Metabolic Panel: Recent Labs  Lab 05/07/17 1200 05/09/17 1217  NA 134* 130*  K 4.5 4.3  CL 100 100*  CO2 21* 17*  GLUCOSE 164* 170*  BUN 28* 22*  CREATININE  --  1.28*  CALCIUM 8.6 7.7*  MG 1.0*  --    Liver Function Tests: Recent Labs  Lab 05/07/17 1200 05/09/17 1217  AST 55* 46*  ALT 52 39  ALKPHOS 82 78  BILITOT 0.7 0.8  PROT 8.0 7.5  ALBUMIN 3.5 3.5   No results for input(s): LIPASE, AMYLASE in the last 168 hours. No results for input(s): AMMONIA in the last 168 hours. CBC: Recent Labs  Lab 05/07/17 1200 05/09/17 1217  WBC  --  3.3*  NEUTROABS 1.5 1.8  HGB  --  8.7*  HCT 30.5* 24.6*  MCV 100.7* 99.6  PLT  --  52*   Cardiac Enzymes: No results for input(s): CKTOTAL, CKMB, CKMBINDEX, TROPONINI in the last 168 hours.  BNP (last 3 results) No results for input(s): BNP in the last 8760 hours.  ProBNP (last 3 results) No results for input(s): PROBNP in the last 8760 hours.  CBG: No results for input(s): GLUCAP in the last 168 hours.  Radiological Exams on Admission: Dg Chest 2 View  Result Date: 05/09/2017 CLINICAL DATA:  Cough and fever.  Lung cancer. EXAM: CHEST  2 VIEW COMPARISON:  05/07/2017 FINDINGS: Progressive enlargement of the right hilum. Given the time frame, this is most likely pneumonia or radiation pneumonitis rather than tumor progression. Left lung remains clear. No heart failure or effusion. Port-A-Cath tip in the lower SVC unchanged. IMPRESSION:  Progressive enlargement of the right hilum which may be due to pneumonia or radiation change. Electronically  Signed   By: Franchot Gallo M.D.   On: 05/09/2017 15:35   Dg Chest 2 View  Result Date: 05/07/2017 CLINICAL DATA:  Cough, wheezing and shortness of breath. Currently on chemotherapy for lung malignancy. EXAM: CHEST  2 VIEW COMPARISON:  Chest x-ray of March 05, 2016 and chest CT scan of March 08, 2017 FINDINGS: A right hilar soft tissue mass persists. There is new patchy infiltrate in the left lower lobe. The heart and pulmonary vascularity are normal. The power port catheter tip projects over the junction of the middle and distal thirds of the SVC. IMPRESSION: Left lower lobe pneumonia. Fairly stable right hilar mass. Followup PA and lateral chest X-ray is recommended in 3-4 weeks following trial of antibiotic therapy to ensure resolution and exclude underlying malignancy. Electronically Signed   By: David  Martinique M.D.   On: 05/07/2017 16:43     Assessment/Plan Present on Admission: **None**   Sepsis, pneumonia, sinus tachycardia, tachypnea, though no hypoxia CTA chest pending,  Admit to stepdown, continue vanc/cefepime, start nebs/mucinex. Respiratory viral panel pending.  Hyponatremia: mild, clinically dehydrated, gentle hydration. Repeat lab in am  Pancytopenia, last chemo on 12/31, also concerns for infection hemoglobin 8.7 platelet 52, wbc 3.3 with toxic granulations Monitor, supportive care.   Metastatic non-small cell lung cancer, Last chemo on 12/31 Defer to oncology, Dr Julien Nordmann is put on care team through epic.   CKDIII, ua no acute findings. Cr close to baseline, renal dosing meds.  H/o psoriatic arthritis:  Has been off enbrel since cancer diagnosis. He has been on prednisone 5mg  bid which is continued.  DVT prophylaxis: scd for now, he has thrombocytopenia, awaiting cta chest,   Consultants: Dr Julien Nordmann  Code Status: full   Family Communication:  Patient  and wife at bedside, [phone] Disposition Plan: admit to stepdown  Time spent: 19mins  Florencia Reasons MD, PhD [facility] Pager [phone] If 7PM-7AM, please contact night-coverage at [email or web address], password [facility]

## 2017-05-09 NOTE — ED Notes (Signed)
RRT PRESENT

## 2017-05-09 NOTE — ED Notes (Signed)
ED Provider at bedside.EDP NANAVATI AT BEDSIDE UPDATING FAMILY AND PT

## 2017-05-09 NOTE — Progress Notes (Signed)
A consult was received from an ED physician for Vancomycin and Cefepime per pharmacy dosing.  The patient's profile has been reviewed for ht/wt/allergies/indication/available labs. A one time order has been placed for the above antibiotics.  Further antibiotics/pharmacy consults should be ordered by admitting physician if indicated.                       Thank you, Reuel Boom, PharmD, BCPS Pager: [phone] 05/09/2017, 12:31 PM

## 2017-05-09 NOTE — Progress Notes (Signed)
Pt removed BIPAP and stated that he wanted to leave it off.  Pt placed on 2 LPM [place] and tolerating well at this time.  RT to monitor and assess as needed.

## 2017-05-09 NOTE — ED Notes (Signed)
PT URINATED HOWEVER DID NOT KEEP SAMPLE. INSSTRUTED TO KEEP SAMPLE FOR COLLECTION

## 2017-05-10 ENCOUNTER — Other Ambulatory Visit: Payer: Self-pay | Admitting: Medical

## 2017-05-10 DIAGNOSIS — J209 Acute bronchitis, unspecified: Secondary | ICD-10-CM

## 2017-05-10 DIAGNOSIS — K1231 Oral mucositis (ulcerative) due to antineoplastic therapy: Secondary | ICD-10-CM

## 2017-05-10 DIAGNOSIS — J440 Chronic obstructive pulmonary disease with acute lower respiratory infection: Secondary | ICD-10-CM

## 2017-05-10 LAB — CBC WITH DIFFERENTIAL/PLATELET
Basophils Absolute: 0 10*3/uL (ref 0.0–0.1)
Basophils Relative: 0 %
Eosinophils Absolute: 0 10*3/uL (ref 0.0–0.7)
Eosinophils Relative: 0 %
HCT: 22.1 % — ABNORMAL LOW (ref 39.0–52.0)
HEMOGLOBIN: 7.6 g/dL — AB (ref 13.0–17.0)
LYMPHS ABS: 0.7 10*3/uL (ref 0.7–4.0)
LYMPHS PCT: 20 %
MCH: 34.4 pg — AB (ref 26.0–34.0)
MCHC: 34.4 g/dL (ref 30.0–36.0)
MCV: 100 fL (ref 78.0–100.0)
MONO ABS: 0.8 10*3/uL (ref 0.1–1.0)
MONOS PCT: 22 %
NEUTROS ABS: 2 10*3/uL (ref 1.7–7.7)
Neutrophils Relative %: 57 %
Platelets: 34 10*3/uL — ABNORMAL LOW (ref 150–400)
RBC: 2.21 MIL/uL — ABNORMAL LOW (ref 4.22–5.81)
RDW: 17.2 % — ABNORMAL HIGH (ref 11.5–15.5)
WBC: 3.2 10*3/uL — ABNORMAL LOW (ref 4.0–10.5)

## 2017-05-10 LAB — RESPIRATORY PANEL BY PCR
ADENOVIRUS-RVPPCR: NOT DETECTED
Bordetella pertussis: NOT DETECTED
CORONAVIRUS 229E-RVPPCR: NOT DETECTED
CORONAVIRUS NL63-RVPPCR: NOT DETECTED
CORONAVIRUS OC43-RVPPCR: NOT DETECTED
Chlamydophila pneumoniae: NOT DETECTED
Coronavirus HKU1: NOT DETECTED
Influenza A: NOT DETECTED
Influenza B: NOT DETECTED
MYCOPLASMA PNEUMONIAE-RVPPCR: NOT DETECTED
Metapneumovirus: NOT DETECTED
PARAINFLUENZA VIRUS 1-RVPPCR: NOT DETECTED
Parainfluenza Virus 2: NOT DETECTED
Parainfluenza Virus 3: NOT DETECTED
Parainfluenza Virus 4: NOT DETECTED
Respiratory Syncytial Virus: DETECTED — AB
Rhinovirus / Enterovirus: NOT DETECTED

## 2017-05-10 LAB — STREP PNEUMONIAE URINARY ANTIGEN: Strep Pneumo Urinary Antigen: NEGATIVE

## 2017-05-10 LAB — C DIFFICILE QUICK SCREEN W PCR REFLEX
C DIFFICILE (CDIFF) INTERP: NOT DETECTED
C DIFFICILE (CDIFF) TOXIN: NEGATIVE
C Diff antigen: NEGATIVE

## 2017-05-10 LAB — COMPREHENSIVE METABOLIC PANEL
ALT: 29 U/L (ref 17–63)
ANION GAP: 9 (ref 5–15)
AST: 39 U/L (ref 15–41)
Albumin: 2.8 g/dL — ABNORMAL LOW (ref 3.5–5.0)
Alkaline Phosphatase: 67 U/L (ref 38–126)
BUN: 16 mg/dL (ref 6–20)
CHLORIDE: 104 mmol/L (ref 101–111)
CO2: 19 mmol/L — ABNORMAL LOW (ref 22–32)
Calcium: 7.1 mg/dL — ABNORMAL LOW (ref 8.9–10.3)
Creatinine, Ser: 1.15 mg/dL (ref 0.61–1.24)
GFR, EST NON AFRICAN AMERICAN: 60 mL/min — AB (ref >60)
Glucose, Bld: 121 mg/dL — ABNORMAL HIGH (ref 65–99)
POTASSIUM: 4.2 mmol/L (ref 3.5–5.1)
Sodium: 132 mmol/L — ABNORMAL LOW (ref 135–145)
Total Bilirubin: 1.1 mg/dL (ref 0.3–1.2)
Total Protein: 6.4 g/dL — ABNORMAL LOW (ref 6.5–8.1)

## 2017-05-10 LAB — MRSA PCR SCREENING: MRSA BY PCR: POSITIVE — AB

## 2017-05-10 LAB — OCCULT BLOOD X 1 CARD TO LAB, STOOL: Fecal Occult Bld: NEGATIVE

## 2017-05-10 LAB — PREPARE RBC (CROSSMATCH)

## 2017-05-10 LAB — ABO/RH: ABO/RH(D): O POS

## 2017-05-10 MED ORDER — LEVALBUTEROL HCL 1.25 MG/0.5ML IN NEBU: 1.2500 mg | INHALATION_SOLUTION | Freq: Four times a day (QID) | RESPIRATORY_TRACT | Status: DC | PRN

## 2017-05-10 MED ORDER — LOPERAMIDE HCL 2 MG PO CAPS
2.0000 mg | ORAL_CAPSULE | Freq: Four times a day (QID) | ORAL | Status: DC | PRN
  Administered 2017-05-10 (×2): 2 mg via ORAL
  Filled 2017-05-10 (×2): qty 1

## 2017-05-10 MED ORDER — ACETAMINOPHEN 325 MG PO TABS
650.0000 mg | ORAL_TABLET | Freq: Four times a day (QID) | ORAL | Status: DC | PRN
  Administered 2017-05-10: 650 mg via ORAL
  Filled 2017-05-10: qty 2

## 2017-05-10 MED ORDER — MUPIROCIN 2 % EX OINT
1.0000 "application " | TOPICAL_OINTMENT | Freq: Two times a day (BID) | CUTANEOUS | Status: DC
  Administered 2017-05-11 – 2017-05-12 (×4): 1 via NASAL
  Filled 2017-05-10 (×2): qty 22

## 2017-05-10 MED ORDER — IPRATROPIUM BROMIDE 0.02 % IN SOLN: 0.5000 mg | Freq: Two times a day (BID) | RESPIRATORY_TRACT | Status: DC

## 2017-05-10 MED ORDER — CHLORHEXIDINE GLUCONATE CLOTH 2 % EX PADS
6.0000 | MEDICATED_PAD | Freq: Every day | CUTANEOUS | Status: DC
  Administered 2017-05-11: 6 via TOPICAL

## 2017-05-10 MED ORDER — CLONAZEPAM 0.5 MG PO TABS: 0.2500 mg | ORAL_TABLET | Freq: Two times a day (BID) | ORAL | Status: DC | PRN

## 2017-05-10 MED ORDER — IPRATROPIUM BROMIDE 0.02 % IN SOLN
0.5000 mg | Freq: Three times a day (TID) | RESPIRATORY_TRACT | Status: DC
  Administered 2017-05-11 – 2017-05-12 (×3): 0.5 mg via RESPIRATORY_TRACT
  Filled 2017-05-10 (×3): qty 2.5

## 2017-05-10 MED ORDER — IPRATROPIUM BROMIDE 0.02 % IN SOLN
0.5000 mg | Freq: Four times a day (QID) | RESPIRATORY_TRACT | Status: DC
  Administered 2017-05-10 (×2): 0.5 mg via RESPIRATORY_TRACT
  Filled 2017-05-10 (×2): qty 2.5

## 2017-05-10 MED ORDER — LEVALBUTEROL HCL 1.25 MG/0.5ML IN NEBU
1.2500 mg | INHALATION_SOLUTION | Freq: Three times a day (TID) | RESPIRATORY_TRACT | Status: DC
  Administered 2017-05-11 – 2017-05-12 (×3): 1.25 mg via RESPIRATORY_TRACT
  Filled 2017-05-10 (×3): qty 0.5

## 2017-05-10 MED ORDER — SODIUM CHLORIDE 0.9 % IV SOLN: Freq: Once | INTRAVENOUS | Status: DC

## 2017-05-10 MED ORDER — LEVALBUTEROL HCL 1.25 MG/0.5ML IN NEBU
1.2500 mg | INHALATION_SOLUTION | Freq: Four times a day (QID) | RESPIRATORY_TRACT | Status: DC
  Administered 2017-05-10 (×2): 1.25 mg via RESPIRATORY_TRACT
  Filled 2017-05-10 (×2): qty 0.5

## 2017-05-10 MED ORDER — LEVALBUTEROL HCL 1.25 MG/0.5ML IN NEBU: 1.2500 mg | INHALATION_SOLUTION | Freq: Two times a day (BID) | RESPIRATORY_TRACT | Status: DC

## 2017-05-10 MED ORDER — METHYLPREDNISOLONE SODIUM SUCC 125 MG IJ SOLR
60.0000 mg | Freq: Three times a day (TID) | INTRAMUSCULAR | Status: DC
  Administered 2017-05-10 – 2017-05-12 (×6): 60 mg via INTRAVENOUS
  Filled 2017-05-10 (×6): qty 2

## 2017-05-10 NOTE — Progress Notes (Addendum)
PROGRESS NOTE  Mark Howell ZDG:[id number] DOB: April 03, 1941 DOA: 05/09/2017 PCP: [name], MD  HPI/Recap of past 24 hours: Fever 102.6 on 2liter oxygen, remain tachypnea and sinus tachycardia, rr range from 22-35, congested nonproductive cough Continue have sinus tachycardia, heart rate range from 110's to 120's No fever He reports having loose stool, no abdominal pain, poor appetite but no n/v. Wife at bedside  Assessment/Plan: Active Problems:   Sepsis ([place])  Sepsis, pneumonia/bronchitis, sinus tachycardia, tachypnea, though no hypoxia -CTA chest no PE, + radiation related changes, fibrosis, bronchitis, copd,  -Remain in  stepdown, continue vanc/cefepime, start nebs/mucinex. Respiratory viral panel in process. Urine strep antigen and legionella antigen pending collection, mrsa screening pending collection. -Remain tachycardia, tachypnea hgb dropped , will transfuse for symptomatic anemia, increase steroids and nebs due to symptom not improving.  Addendum: respiratory viral panel + RSV.  Hyponatremia: mild, clinically dehydrated, gentle hydration. Sodium improved, continue hydration. Repeat lab in am  Pancytopenia, last chemo on 12/31, also concerns for infection hemoglobin 8.7 platelet 52, wbc 3.3 with toxic granulations on admission hgb and plt dropping, supportive transfusion Monitor, supportive care.   Metastatic non-small cell lung cancer, Last chemo on 12/31 Defer to oncology, Dr Julien Nordmann is put on care team through epic.   Diarrhea: will get c diff   CKDIII, Bun/cr 22/1.28 on admission, bun/cr 16/1.15 after hydration, ua no acute findings. Cr close to baseline, renal dosing meds.  H/o psoriatic arthritis:  Has been off enbrel since cancer diagnosis. He has been on prednisone 5mg  bid at home. Now on iv solumedrol  DVT prophylaxis: scd for now, he has thrombocytopenia,   Consultants: Dr Julien Nordmann  Code Status: full   Family Communication:   Patient and wife at bedside, [phone] Disposition Plan: admit to stepdown   Procedures:  prbc transfusion on 1/10  Antibiotics:  Vanc/cefepime    Objective: BP 129/82   Pulse (!) 112   Temp 98.7 F (37.1 C) (Oral)   Resp (!) 22   Ht 6\' 1"  (1.854 m)   Wt 79.4 kg (175 lb)   SpO2 100%   BMI 23.09 kg/m   Intake/Output Summary (Last 24 hours) at 05/10/2017 0737 Last data filed at 05/09/2017 1548 Gross per 24 hour  Intake 4500 ml  Output 300 ml  Net 4200 ml   Filed Weights   05/09/17 1157  Weight: 79.4 kg (175 lb)    Exam: Patient is examined daily including today on 05/10/2017, exams remain the same as of yesterday except that has changed    General:  Pale, tachypnea   Cardiovascular: sinus tachycardia   Respiratory: coarse breath sounds, right worse than left.  Abdomen: Soft/ND/NT, positive BS  Musculoskeletal: No Edema  Neuro: alert, oriented   Data Reviewed: Basic Metabolic Panel: Recent Labs  Lab 05/07/17 1200 05/09/17 1217 05/10/17 0521  NA 134* 130* 132*  K 4.5 4.3 4.2  CL 100 100* 104  CO2 21* 17* 19*  GLUCOSE 164* 170* 121*  BUN 28* 22* 16  CREATININE  --  1.28* 1.15  CALCIUM 8.6 7.7* 7.1*  MG 1.0*  --   --    Liver Function Tests: Recent Labs  Lab 05/07/17 1200 05/09/17 1217 05/10/17 0521  AST 55* 46* 39  ALT 52 39 29  ALKPHOS 82 78 67  BILITOT 0.7 0.8 1.1  PROT 8.0 7.5 6.4*  ALBUMIN 3.5 3.5 2.8*   No results for input(s): LIPASE, AMYLASE in the last 168 hours. No results for input(s):  AMMONIA in the last 168 hours. CBC: Recent Labs  Lab 05/07/17 1200 05/09/17 1217 05/10/17 0556  WBC  --  3.3* 3.2*  NEUTROABS 1.5 1.8 2.0  HGB  --  8.7* 7.6*  HCT 30.5* 24.6* 22.1*  MCV 100.7* 99.6 100.0  PLT  --  52* 34*   Cardiac Enzymes:   No results for input(s): CKTOTAL, CKMB, CKMBINDEX, TROPONINI in the last 168 hours. BNP (last 3 results) No results for input(s): BNP in the last 8760 hours.  ProBNP (last 3 results) No  results for input(s): PROBNP in the last 8760 hours.  CBG: No results for input(s): GLUCAP in the last 168 hours.  No results found for this or any previous visit (from the past 240 hour(s)).   Studies: Dg Chest 2 View  Result Date: 05/09/2017 CLINICAL DATA:  Cough and fever.  Lung cancer. EXAM: CHEST  2 VIEW COMPARISON:  05/07/2017 FINDINGS: Progressive enlargement of the right hilum. Given the time frame, this is most likely pneumonia or radiation pneumonitis rather than tumor progression. Left lung remains clear. No heart failure or effusion. Port-A-Cath tip in the lower SVC unchanged. IMPRESSION: Progressive enlargement of the right hilum which may be due to pneumonia or radiation change. Electronically Signed   By: Franchot Gallo M.D.   On: 05/09/2017 15:35   Ct Angio Chest Pe W And/or Wo Contrast  Result Date: 05/09/2017 CLINICAL DATA:  77 year old male with history of metastatic non-small cell lung cancer on chemotherapy presenting with history of fever, weakness and shortness of breath for the past 4 weeks. EXAM: CT ANGIOGRAPHY CHEST WITH CONTRAST TECHNIQUE: Multidetector CT imaging of the chest was performed using the standard protocol during bolus administration of intravenous contrast. Multiplanar CT image reconstructions and MIPs were obtained to evaluate the vascular anatomy. CONTRAST:  168mL ISOVUE-370 IOPAMIDOL (ISOVUE-370) INJECTION 76% COMPARISON:  Chest CT 03/08/2017. FINDINGS: Cardiovascular: Today's study is limited by considerable patient respiratory motion. With this limitation in mind, there is no central, lobar or proximal segmental sized pulmonary embolism. More distal segmental or subsegmental sized emboli cannot be entirely excluded secondary to respiratory motion. Heart size is normal. There is no significant pericardial fluid, thickening or pericardial calcification. There is aortic atherosclerosis, as well as atherosclerosis of the great vessels of the mediastinum and the  coronary arteries, including calcified atherosclerotic plaque in the left anterior descending coronary artery. Calcification of the aortic valve. Right internal jugular single-lumen porta cath with tip terminating at the superior cavoatrial junction. Mediastinum/Nodes: Extensive soft tissue thickening in the right hilar region likely related to prior radiation therapy and similar to the prior examination. No pathologically enlarged mediastinal or left hilar lymph nodes. Esophagus is unremarkable in appearance. No axillary lymphadenopathy. Lungs/Pleura: Increasing mass-like architectural distortion throughout the medial aspect of the right lung emanating outward from the hilar region, likely to reflect progressive postradiation mass-like fibrosis. No definite suspicious appearing pulmonary nodule or mass to strongly suggest recurrent or metastatic disease in the lungs. Several nodular areas of architectural distortion in the periphery of the left lung appear stable compared to prior examinations, most compatible with areas of post infectious or inflammatory scarring. No acute consolidative airspace disease. No pleural effusions. Diffuse bronchial wall thickening with mild centrilobular and paraseptal emphysema. Upper Abdomen: Aortic atherosclerosis. 1.8 cm right adrenal nodule is unchanged. Left adrenal mass has clearly enlarged, currently measuring 3.3 cm in diameter. Musculoskeletal: There are no aggressive appearing lytic or blastic lesions noted in the visualized portions of the skeleton. Review of  the MIP images confirms the above findings. IMPRESSION: 1. Although today's study is limited by considerable patient respiratory motion there is no evidence of central, lobar or proximal segmental sized pulmonary embolism. 2. Evolving chronic postradiation changes of mass-like fibrosis throughout the medial aspect of the right lung. 3. 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 4. Aortic atherosclerosis, in addition to left anterior descending coronary artery disease. 5. There are calcifications of the aortic valve and mitral annulus. Echocardiographic correlation for evaluation of potential valvular dysfunction may be warranted if clinically indicated. 6. Mild diffuse bronchial thickening with mild centrilobular and paraseptal emphysema; imaging findings suggestive of underlying COPD. Aortic Atherosclerosis (ICD10-I70.0) and Emphysema (ICD10-J43.9). Electronically Signed   By: Vinnie Langton M.D.   On: 05/09/2017 18:57    Scheduled Meds: . folic acid  1 mg Oral Daily  . guaiFENesin  600 mg Oral BID  . ipratropium  0.5 mg Nebulization Q6H  . levalbuterol  1.25 mg Nebulization Q6H  . pantoprazole  40 mg Oral Daily  . predniSONE  5 mg Oral BID WC  . tamsulosin  0.4 mg Oral q morning - 10a  . triamcinolone  1 spray Nasal QHS    Continuous Infusions: . sodium chloride 75 mL/hr at 05/10/17 0010  . sodium chloride    . ceFEPime (MAXIPIME) IV Stopped (05/10/17 0451)  . vancomycin Stopped (05/10/17 0205)     Time spent: 35 mins I have personally reviewed and interpreted on  05/10/2017 daily labs, tele strips, imagings as discussed above under date review session and assessment and plans.  I reviewed all nursing notes, pharmacy notes,  vitals, pertinent old records  I have discussed plan of care as described above with RN , patient and family on 05/10/2017   Florencia Reasons MD, PhD  [facility] Pager [phone]. If 7PM-7AM, please contact night-coverage at [email or web address], password [facility] 05/10/2017, 7:37 AM  LOS: 1 day

## 2017-05-10 NOTE — ED Notes (Signed)
Bed: PB35 Expected date:  Expected time:  Means of arrival:  Comments: Hold for RES

## 2017-05-10 NOTE — ED Notes (Signed)
RN notified of abnormal lab 

## 2017-05-10 NOTE — ED Notes (Signed)
Bed: WA17 Expected date:  Expected time:  Means of arrival:  Comments: Hold for RES

## 2017-05-10 NOTE — ED Notes (Signed)
Upon arrival to shift this morning pt stated he had been laying in feces for hours. Writer and Day Shift RN changed the patients bed linen and cleaned patient from head to toe including all of pts cords. Pt was placed in a breif along with chuck pads and warm blankets. Pt was given a cup of ice water and his call bell. Pt is currently resting comfortably.

## 2017-05-11 ENCOUNTER — Other Ambulatory Visit: Payer: Self-pay | Admitting: *Deleted

## 2017-05-11 DIAGNOSIS — K1231 Oral mucositis (ulcerative) due to antineoplastic therapy: Secondary | ICD-10-CM

## 2017-05-11 DIAGNOSIS — Z5111 Encounter for antineoplastic chemotherapy: Secondary | ICD-10-CM

## 2017-05-11 DIAGNOSIS — C3491 Malignant neoplasm of unspecified part of right bronchus or lung: Secondary | ICD-10-CM

## 2017-05-11 LAB — TSH: TSH: 1.759 u[IU]/mL (ref 0.350–4.500)

## 2017-05-11 LAB — BPAM RBC
BLOOD PRODUCT EXPIRATION DATE: 201902092359
ISSUE DATE / TIME: 201901101323
Unit Type and Rh: 5100

## 2017-05-11 LAB — BASIC METABOLIC PANEL
Anion gap: 9 (ref 5–15)
BUN: 18 mg/dL (ref 6–20)
CHLORIDE: 104 mmol/L (ref 101–111)
CO2: 19 mmol/L — AB (ref 22–32)
Calcium: 7.2 mg/dL — ABNORMAL LOW (ref 8.9–10.3)
Creatinine, Ser: 1 mg/dL (ref 0.61–1.24)
GFR calc Af Amer: >60 mL/min (ref >60)
GFR calc non Af Amer: >60 mL/min (ref >60)
GLUCOSE: 221 mg/dL — AB (ref 65–99)
POTASSIUM: 5 mmol/L (ref 3.5–5.1)
Sodium: 132 mmol/L — ABNORMAL LOW (ref 135–145)

## 2017-05-11 LAB — TYPE AND SCREEN
ABO/RH(D): O POS
ANTIBODY SCREEN: NEGATIVE
Unit division: 0

## 2017-05-11 LAB — CBC WITH DIFFERENTIAL/PLATELET
BASOS PCT: 0 %
Basophils Absolute: 0 10*3/uL (ref 0.0–0.1)
EOS PCT: 0 %
Eosinophils Absolute: 0 10*3/uL (ref 0.0–0.7)
HEMATOCRIT: 23.4 % — AB (ref 39.0–52.0)
Hemoglobin: 8.2 g/dL — ABNORMAL LOW (ref 13.0–17.0)
Lymphocytes Relative: 8 %
Lymphs Abs: 0.2 10*3/uL — ABNORMAL LOW (ref 0.7–4.0)
MCH: 33.5 pg (ref 26.0–34.0)
MCHC: 35 g/dL (ref 30.0–36.0)
MCV: 95.5 fL (ref 78.0–100.0)
MONOS PCT: 4 %
Monocytes Absolute: 0.1 10*3/uL (ref 0.1–1.0)
NEUTROS PCT: 88 %
Neutro Abs: 2.2 10*3/uL (ref 1.7–7.7)
Platelets: 33 10*3/uL — ABNORMAL LOW (ref 150–400)
RBC: 2.45 MIL/uL — AB (ref 4.22–5.81)
RDW: 19.1 % — ABNORMAL HIGH (ref 11.5–15.5)
WBC: 2.5 10*3/uL — AB (ref 4.0–10.5)

## 2017-05-11 MED ORDER — SODIUM BICARBONATE 650 MG PO TABS
650.0000 mg | ORAL_TABLET | Freq: Two times a day (BID) | ORAL | Status: DC
  Administered 2017-05-11 – 2017-05-12 (×3): 650 mg via ORAL
  Filled 2017-05-11 (×3): qty 1

## 2017-05-11 MED ORDER — METOPROLOL TARTRATE 25 MG PO TABS
12.5000 mg | ORAL_TABLET | Freq: Two times a day (BID) | ORAL | Status: DC
  Administered 2017-05-11 – 2017-05-12 (×3): 12.5 mg via ORAL
  Filled 2017-05-11 (×3): qty 1

## 2017-05-11 MED ORDER — LIDOCAINE VISCOUS 2 % MT SOLN: 20.0000 mL | OROMUCOSAL | 2 refills | Status: AC | PRN

## 2017-05-11 NOTE — Progress Notes (Signed)
PROGRESS NOTE  Mark Howell TGG:[id number] DOB: 10-24-1940 DOA: 05/09/2017 PCP: [name], MD  HPI/Recap of past 24 hours:  Fever resolved, less tachypnea, less tachycardia , less cough,  states " something is working" He is on on 2liter oxygen,  Report diarrhea has resolved  no abdominal pain,  He want diet to be advanced. Wife at bedside  Assessment/Plan: Active Problems:   Sepsis ([place])  Sepsis, pneumonia/bronchitis, sinus tachycardia, tachypnea, though no hypoxia -CTA chest no PE, + radiation related changes, fibrosis, diffuse bronchitis, copd,  -Respiratory viral panel +RSV. Urine strep antigen negative and legionella antigen in process, mrsa screening + , sputum sample pending collection -he is started on vanc/cefepime since admission and admitted to stepdown, on steroids/ nebs/mucinex.  -s/p 1prbc for symptomatic anemia -he is improving, transfer to med surg   Hyponatremia: mild, clinically dehydrated, gentle hydration. Sodium improved Advance diet  Pancytopenia,  last chemo on 12/31, also concerns for infection hemoglobin 8.7 platelet 52, wbc 3.3 with toxic granulations on admission hgb dropped to 7.6, s/p 1prbc on 1/10 plt seems has nadir around 33-34 Monitor, supportive care.   Metastatic non-small cell lung cancer, Last chemo on 12/31 Defer to oncology, Dr Julien Nordmann is put on care team through epic.   Diarrhea: c diff negative Diarrhea seems slowed down  CKDIII, Bun/cr 22/1.28 on admission, bun/cr 18/1.00 on 1/11, ua no acute findings. Cr close to baseline, renal dosing meds.  H/o psoriatic arthritis:  Has been off enbrel since cancer diagnosis. He has been on prednisone 5mg  bid at home. Now on iv solumedrol   mrsa colonization: contact precaution, decolinization  DVT prophylaxis: scd for now, he has thrombocytopenia,   Consultants: Dr Julien Nordmann  Code Status: full   Family Communication:  Patient and wife at bedside, [phone]  [phone] Disposition Plan: improving, transfer to med surg   Procedures:  prbc transfusion on 1/10  Antibiotics:  Vanc/cefepime    Objective: BP 123/75   Pulse 96   Temp 98.4 F (36.9 C) (Oral)   Resp (!) 27   Ht 6\' 1"  (1.854 m)   Wt 79.4 kg (175 lb)   SpO2 99%   BMI 23.09 kg/m   Intake/Output Summary (Last 24 hours) at 05/11/2017 0745 Last data filed at 05/11/2017 0500 Gross per 24 hour  Intake 615 ml  Output 1400 ml  Net -785 ml   Filed Weights   05/09/17 1157  Weight: 79.4 kg (175 lb)    Exam: Patient is examined daily including today on 05/11/2017, exams remain the same as of yesterday except that has changed    General:  Pale, looks better  Cardiovascular: less sinus tachycardia   Respiratory: coarse breath sounds on the right, left lung sounds has much improved  Abdomen: Soft/ND/NT, positive BS  Musculoskeletal: No Edema  Neuro: alert, oriented   Data Reviewed: Basic Metabolic Panel: Recent Labs  Lab 05/07/17 1200 05/09/17 1217 05/10/17 0521 05/11/17 0305  NA 134* 130* 132* 132*  K 4.5 4.3 4.2 5.0  CL 100 100* 104 104  CO2 21* 17* 19* 19*  GLUCOSE 164* 170* 121* 221*  BUN 28* 22* 16 18  CREATININE  --  1.28* 1.15 1.00  CALCIUM 8.6 7.7* 7.1* 7.2*  MG 1.0*  --   --   --    Liver Function Tests: Recent Labs  Lab 05/07/17 1200 05/09/17 1217 05/10/17 0521  AST 55* 46* 39  ALT 52 39 29  ALKPHOS 82 78 67  BILITOT 0.7 0.8 1.1  PROT 8.0 7.5 6.4*  ALBUMIN 3.5 3.5 2.8*   No results for input(s): LIPASE, AMYLASE in the last 168 hours. No results for input(s): AMMONIA in the last 168 hours. CBC: Recent Labs  Lab 05/07/17 1200 05/09/17 1217 05/10/17 0556 05/11/17 0305  WBC  --  3.3* 3.2* 2.5*  NEUTROABS 1.5 1.8 2.0 2.2  HGB  --  8.7* 7.6* 8.2*  HCT 30.5* 24.6* 22.1* 23.4*  MCV 100.7* 99.6 100.0 95.5  PLT  --  52* 34* 33*   Cardiac Enzymes:   No results for input(s): CKTOTAL, CKMB, CKMBINDEX, TROPONINI in the last 168  hours. BNP (last 3 results) No results for input(s): BNP in the last 8760 hours.  ProBNP (last 3 results) No results for input(s): PROBNP in the last 8760 hours.  CBG: No results for input(s): GLUCAP in the last 168 hours.  Recent Results (from the past 240 hour(s))  Blood Culture (routine x 2)     Status: None (Preliminary result)   Collection Time: 05/09/17 12:17 PM  Result Value Ref Range Status   Specimen Description BLOOD LEFT FOREARM  Final   Special Requests   Final    BOTTLES DRAWN AEROBIC AND ANAEROBIC Blood Culture adequate volume   Culture   Final    NO GROWTH < 24 HOURS Performed at [facility], [address], [place], [place] [address]    Report Status PENDING  Incomplete  Blood Culture (routine x 2)     Status: None (Preliminary result)   Collection Time: 05/09/17 12:41 PM  Result Value Ref Range Status   Specimen Description BLOOD LEFT FOREARM  Final   Special Requests   Final    BOTTLES DRAWN AEROBIC AND ANAEROBIC Blood Culture adequate volume   Culture   Final    NO GROWTH < 24 HOURS Performed at [facility], [place] [address], [place], [place] [address]    Report Status PENDING  Incomplete  Respiratory Panel by PCR     Status: Abnormal   Collection Time: 05/09/17  3:42 PM  Result Value Ref Range Status   Adenovirus NOT DETECTED NOT DETECTED Final   Coronavirus 229E NOT DETECTED NOT DETECTED Final   Coronavirus HKU1 NOT DETECTED NOT DETECTED Final   Coronavirus NL63 NOT DETECTED NOT DETECTED Final   Coronavirus OC43 NOT DETECTED NOT DETECTED Final   Metapneumovirus NOT DETECTED NOT DETECTED Final   Rhinovirus / Enterovirus NOT DETECTED NOT DETECTED Final   Influenza A NOT DETECTED NOT DETECTED Final   Influenza B NOT DETECTED NOT DETECTED Final   Parainfluenza Virus 1 NOT DETECTED NOT DETECTED Final   Parainfluenza Virus 2 NOT DETECTED NOT DETECTED Final   Parainfluenza Virus 3 NOT DETECTED NOT DETECTED Final   Parainfluenza Virus 4 NOT  DETECTED NOT DETECTED Final   Respiratory Syncytial Virus DETECTED (A) NOT DETECTED Final    Comment: CRITICAL RESULT CALLED TO, READ BACK BY AND VERIFIED WITH: Kathi Simpers RN 10:35 05/10/17 ([email or web address])    Bordetella pertussis NOT DETECTED NOT DETECTED Final   Chlamydophila pneumoniae NOT DETECTED NOT DETECTED Final   Mycoplasma pneumoniae NOT DETECTED NOT DETECTED Final  C difficile quick scan w PCR reflex     Status: None   Collection Time: 05/10/17 10:35 AM  Result Value Ref Range Status   C Diff antigen NEGATIVE NEGATIVE Final   C Diff toxin NEGATIVE NEGATIVE Final   C Diff interpretation No C. difficile detected.  Final  MRSA PCR Screening  Status: Abnormal   Collection Time: 05/10/17  5:31 PM  Result Value Ref Range Status   MRSA by PCR POSITIVE (A) NEGATIVE Final    Comment:        The GeneXpert MRSA Assay (FDA approved for NASAL specimens only), is one component of a comprehensive MRSA colonization surveillance program. It is not intended to diagnose MRSA infection nor to guide or monitor treatment for MRSA infections. RESULT CALLED TO, READ BACK BY AND VERIFIED WITH: Alvera Novel [id number] @ 2232 BY J SCOTTON      Studies: No results found.  Scheduled Meds: . Chlorhexidine Gluconate Cloth  6 each Topical Q0600  . folic acid  1 mg Oral Daily  . guaiFENesin  600 mg Oral BID  . ipratropium  0.5 mg Nebulization TID  . levalbuterol  1.25 mg Nebulization TID  . methylPREDNISolone (SOLU-MEDROL) injection  60 mg Intravenous Q8H  . mupirocin ointment  1 application Nasal BID  . pantoprazole  40 mg Oral Daily  . tamsulosin  0.4 mg Oral q morning - 10a  . triamcinolone  1 spray Nasal QHS    Continuous Infusions: . sodium chloride    . ceFEPime (MAXIPIME) IV 1 g (05/11/17 0744)  . vancomycin Stopped (05/10/17 2215)     Time spent: 35 mins I have personally reviewed and interpreted on  05/11/2017 daily labs, tele strips, imagings as discussed above under date review  session and assessment and plans.  I reviewed all nursing notes, pharmacy notes,  vitals, pertinent old records  I have discussed plan of care as described above with RN , patient and family on 05/11/2017   Florencia Reasons MD, PhD  [facility] Pager [phone]. If 7PM-7AM, please contact night-coverage at [email or web address], password [facility] 05/11/2017, 7:45 AM  LOS: 2 days

## 2017-05-12 DIAGNOSIS — J189 Pneumonia, unspecified organism: Secondary | ICD-10-CM

## 2017-05-12 DIAGNOSIS — E872 Acidosis: Secondary | ICD-10-CM

## 2017-05-12 DIAGNOSIS — J210 Acute bronchiolitis due to respiratory syncytial virus: Secondary | ICD-10-CM

## 2017-05-12 LAB — BASIC METABOLIC PANEL
Anion gap: 11 (ref 5–15)
BUN: 32 mg/dL — AB (ref 6–20)
CO2: 18 mmol/L — ABNORMAL LOW (ref 22–32)
CREATININE: 1.16 mg/dL (ref 0.61–1.24)
Calcium: 7.4 mg/dL — ABNORMAL LOW (ref 8.9–10.3)
Chloride: 104 mmol/L (ref 101–111)
GFR calc Af Amer: >60 mL/min (ref >60)
GFR calc non Af Amer: 59 mL/min — ABNORMAL LOW (ref >60)
Glucose, Bld: 294 mg/dL — ABNORMAL HIGH (ref 65–99)
Potassium: 4.8 mmol/L (ref 3.5–5.1)
SODIUM: 133 mmol/L — AB (ref 135–145)

## 2017-05-12 LAB — CBC WITH DIFFERENTIAL/PLATELET
Basophils Absolute: 0 10*3/uL (ref 0.0–0.1)
Basophils Relative: 0 %
EOS ABS: 0 10*3/uL (ref 0.0–0.7)
EOS PCT: 0 %
HCT: 23.7 % — ABNORMAL LOW (ref 39.0–52.0)
Hemoglobin: 8.2 g/dL — ABNORMAL LOW (ref 13.0–17.0)
LYMPHS ABS: 0.2 10*3/uL — AB (ref 0.7–4.0)
Lymphocytes Relative: 5 %
MCH: 33.2 pg (ref 26.0–34.0)
MCHC: 34.6 g/dL (ref 30.0–36.0)
MCV: 96 fL (ref 78.0–100.0)
MONO ABS: 0.5 10*3/uL (ref 0.1–1.0)
MONOS PCT: 11 %
Neutro Abs: 3.3 10*3/uL (ref 1.7–7.7)
Neutrophils Relative %: 84 %
PLATELETS: 41 10*3/uL — AB (ref 150–400)
RBC: 2.47 MIL/uL — ABNORMAL LOW (ref 4.22–5.81)
RDW: 18.8 % — ABNORMAL HIGH (ref 11.5–15.5)
WBC: 4 10*3/uL (ref 4.0–10.5)

## 2017-05-12 LAB — LEGIONELLA PNEUMOPHILA SEROGP 1 UR AG: L. PNEUMOPHILA SEROGP 1 UR AG: NEGATIVE

## 2017-05-12 MED ORDER — PREDNISONE 10 MG PO TABS: 10.0000 mg | ORAL_TABLET | Freq: Every day | ORAL | 0 refills | Status: DC

## 2017-05-12 MED ORDER — METOPROLOL TARTRATE 25 MG PO TABS: 12.5000 mg | ORAL_TABLET | Freq: Two times a day (BID) | ORAL | 0 refills | Status: DC

## 2017-05-12 MED ORDER — PREDNISONE 10 MG PO TABS: ORAL_TABLET | ORAL | 0 refills | Status: DC

## 2017-05-12 MED ORDER — DOXYCYCLINE HYCLATE 100 MG PO CAPS: 100.0000 mg | ORAL_CAPSULE | Freq: Two times a day (BID) | ORAL | 0 refills | Status: AC

## 2017-05-12 MED ORDER — PREDNISONE 5 MG PO TABS: 5.0000 mg | ORAL_TABLET | Freq: Two times a day (BID) | ORAL | 0 refills | Status: DC

## 2017-05-12 MED ORDER — SODIUM BICARBONATE 650 MG PO TABS: 650.0000 mg | ORAL_TABLET | Freq: Every day | ORAL | 0 refills | Status: DC

## 2017-05-12 MED ORDER — GUAIFENESIN ER 600 MG PO TB12: 600.0000 mg | ORAL_TABLET | Freq: Two times a day (BID) | ORAL | 0 refills | Status: DC

## 2017-05-12 NOTE — Progress Notes (Signed)
Patient discharged to home, all discharge medications and instructions reviewed and questions answered.  Patient to be assisted to vehicle by wheelchair.  

## 2017-05-12 NOTE — Discharge Summary (Addendum)
Discharge Summary  Mark Howell:[id number] DOB: 1941/03/17  PCP: [name], MD  Admit date: 05/09/2017 Discharge date: 05/12/2017  Time spent: >73mins, more than 50% time spent on coordination of care.  Recommendations for Outpatient Follow-up:  1. F/u with PMD within a week  for hospital discharge follow up, repeat cbc/bmp at follow up 2. F/u with oncology Dr Julien Nordmann   Discharge Diagnoses:  Active Hospital Problems   Diagnosis Date Noted  . Sepsis ([place]) 05/09/2017    Resolved Hospital Problems  No resolved problems to display.    Discharge Condition: stable  Diet recommendation: regular diet  Filed Weights   05/09/17 1157  Weight: 79.4 kg (175 lb)    History of present illness:  PCP: [name], MD   Chief Complaint: sob, tachycardia, fever, cough  HPI: Mark Howell is a 77 y.o. male   Metastatic non-small cell lung cancer, last chemo on 12/31 with  Pemetrexad, carboplatin and avastin, he started to have nonproductive cough two weeks ago, he was diagnosed with left lower lobe pneumonia at oncology clinic on 1/7 and was treated with levaquin. He did not get better, has become progressive sob, not able to walk without being sob. He denies chest pain, reports cough is nonproductive. He report fever of 100.8 at home.   ED Course: He has no fever no hypoxia in the ED he does has tachypnea respiration rate ranged from 26-36 he also has sinus tachycardia heart rate ranged from 114-124, blood pressure stable. WBC 3.3 with toxic granulation, hemoglobin 8.7 platelet 52, sodium 130 BUN 22 creatinine 1.28.  Lactic acid 1.76, clonidine 0.02.   UA no acute findings.  EKg sinus tachycardia,  cxr in the ED "Progressive enlargement of the right hilum. Given the time frame, this is most likely pneumonia or radiation pneumonitis rather than tumor progression. Left lung remains clear. No heart failure or effusion. Port-A-Cath tip in the lower SVC unchanged"   Blood culture obtained.  He received NS at 30cc/kgx1,  Started on Vanco and cefepime in the ED. hospitalist called to admit the patient.    Hospital Course:  Active Problems:   Sepsis ([place])   Sepsis, pneumonia/bronchitis,fever, sinus tachycardia, tachypnea, though no hypoxia -CTA chest no PE, + radiation related changes, fibrosis on the right side, diffuse bronchitis, copd. -Respiratory viral panel +RSV. mrsa screening + . Urine strep antigen negative and legionella antigen in process, , sputum sample not colleted.  -he is started on vanc/cefepime since admission and admitted to stepdown, on steroids/ nebs/mucinex.  -s/p 1prbc for symptomatic anemia -he is improving, fever resolved, respiration rate and heart rate normalized.  he wants to go home, he is discharged on doxycycline, steroids taper. Close follow up with pmd and oncology.  sinus tachycardia/HTN: He is stared on low dose lopressor. bp and heart rate stable at discharge.  Outpatient follow up for meds titration.  Hyponatremia: mild, clinically dehydrated, gentle hydration. Sodium improved Advance diet  Pancytopenia, last chemo on 12/31, also concerns for infection hemoglobin 8.7 platelet 52, wbc 3.3 with toxic granulations on admission hgb dropped to 7.6, s/p 1prbc on 1/10 plt seems has nadir around 33-34 Cbc improving at discharge, patient is to repeat cbc early next week.  Metastatic non-small cell lung cancer, Last chemo on 12/31 Defer to oncology, Dr Julien Nordmann.  Diarrhea: c diff negative Diarrhea seems slowed down He does has mild metabolic acidosis, likely from the diarrhea, started on sodium bicarb supplement.  CKDIII, Bun/cr 22/1.28 on admission, bun/cr 18/1.00 on  1/11, ua no acute findings. Cr close to baseline, renal dosing meds.  H/o psoriatic arthritis:  Has been off enbrel since cancer diagnosis. He has been on prednisone 5mg  bid at home. Continue home meds.   mrsa colonization:  contact precaution, decolinization  DVT prophylaxis:scd for now, he has thrombocytopenia,   Consultants:Dr [name]  Code Status:full   Family Communication:Patient and wife at bedside, [phone] Disposition Plan:d/c home   Procedures:  prbc transfusion on 1/10  Antibiotics:  Vanc/cefepime from admission   Discharge Exam: BP 122/76 (BP Location: Right Arm)   Pulse 92   Temp 97.6 F (36.4 C) (Oral)   Resp 20   Ht 6\' 1"  (1.854 m)   Wt 79.4 kg (175 lb)   SpO2 96%   BMI 23.09 kg/m   General: NAD Cardiovascular: rrr Respiratory: coarse breath sounds on the right , left side has much improved. No wheezing  Discharge Instructions You were cared for by a hospitalist during your hospital stay. If you have any questions about your discharge medications or the care you received while you were in the hospital after you are discharged, you can call the unit and asked to speak with the hospitalist on call if the hospitalist that took care of you is not available. Once you are discharged, your primary care physician will handle any further medical issues. Please note that NO REFILLS for any discharge medications will be authorized once you are discharged, as it is imperative that you return to your primary care physician (or establish a relationship with a primary care physician if you do not have one) for your aftercare needs so that they can reassess your need for medications and monitor your lab values.  Discharge Instructions    Diet general   Complete by:  As directed    Increase activity slowly   Complete by:  As directed      Allergies as of 05/12/2017      Reactions   Hydrocodone Rash   Oxycodone Rash      Medication List    STOP taking these medications   Etanercept 25 MG/0.5ML Sosy   levofloxacin 500 MG tablet Commonly known as:  LEVAQUIN     TAKE these medications   acetaminophen 500 MG tablet Commonly known as:  TYLENOL Take 500-1,000 mg by  mouth 2 (two) times daily as needed for mild pain.   aspirin EC 81 MG tablet Take 81 mg by mouth at bedtime.   aspirin-sod bicarb-citric acid 325 MG Tbef tablet Commonly known as:  ALKA-SELTZER Take 650 mg by mouth daily as needed (indigestion).   benzonatate 100 MG capsule Commonly known as:  TESSALON Take 2 capsules (200 mg total) by mouth 3 (three) times daily as needed for cough.   calcium carbonate 500 MG chewable tablet Commonly known as:  TUMS - dosed in mg elemental calcium Chew 1 tablet by mouth daily.   Cetirizine HCl 10 MG Tbdp Take by mouth.   CO Q 10 PO Take 1 tablet by mouth daily.   dexamethasone 4 MG tablet Commonly known as:  DECADRON 4 mg by mouth twice a day the day before, day of and day after chemotherapy every 3 weeks.   diphenhydramine-acetaminophen 25-500 MG Tabs tablet Commonly known as:  TYLENOL PM Take 1 tablet by mouth at bedtime. For sleep   diphenoxylate-atropine 2.5-0.025 MG tablet Commonly known as:  LOMOTIL Take 2 tablets by mouth 4 (four) times daily as needed for diarrhea or loose stools.  doxycycline 100 MG capsule Commonly known as:  VIBRAMYCIN Take 1 capsule (100 mg total) by mouth 2 (two) times daily for 3 days.   folic acid 1 MG tablet Commonly known as:  FOLVITE Take 1 tablet (1 mg total) by mouth daily.   guaiFENesin 600 MG 12 hr tablet Commonly known as:  MUCINEX Take 1 tablet (600 mg total) by mouth 2 (two) times daily.   IMODIUM A-D 2 MG tablet Generic drug:  loperamide Take 2 mg by mouth daily as needed for diarrhea or loose stools.   lidocaine 2 % solution Commonly known as:  XYLOCAINE Use as directed 20 mLs in the mouth or throat every 3 (three) hours as needed for mouth pain.   lidocaine-prilocaine cream Commonly known as:  EMLA Apply 1 application topically as needed. What changed:  reasons to take this   LORazepam 0.5 MG tablet Commonly known as:  ATIVAN Take 1 tablet (0.5 mg total) by mouth 2 (two)  times daily as needed for anxiety.   Lutein 10 MG Tabs Take 10 mg by mouth at bedtime.   magic mouthwash Soln Take 5 mLs by mouth 4 (four) times daily as needed for mouth pain. What changed:  additional instructions   METHYL B-12 PO Take 1 tablet by mouth daily.   metoprolol tartrate 25 MG tablet Commonly known as:  LOPRESSOR Take 0.5 tablets (12.5 mg total) by mouth 2 (two) times daily.   multivitamin with minerals Tabs tablet Take 1 tablet by mouth daily.   nystatin powder Commonly known as:  MYCOSTATIN/NYSTOP Apply topically 3 (three) times daily. Apply to groin rash.   pantoprazole 40 MG tablet Commonly known as:  PROTONIX Take 40 mg by mouth daily.   pravastatin 40 MG tablet Commonly known as:  PRAVACHOL Take 40 mg by mouth at bedtime.   predniSONE 10 MG tablet Commonly known as:  DELTASONE Label  & dispense according to the schedule below. 6 Pills PO on day one then, 5 Pills PO on day two, 4 Pills PO on day three, 3Pills PO on day four, 2 Pills PO on day five,  1 Pills PO on day six,  then go back on your previous prednisone regimen. Total of 21 tabs What changed:    how much to take  how to take this  when to take this  additional instructions  Another medication with the same name was removed. Continue taking this medication, and follow the directions you see here.   predniSONE 5 MG tablet Commonly known as:  DELTASONE Take 1 tablet (5 mg total) by mouth 2 (two) times daily with a meal. Start after 6 days of steroids taper. Start taking on:  05/18/2017 What changed:    additional instructions  These instructions start on 05/18/2017. If you are unsure what to do until then, ask your doctor or other care provider.  Another medication with the same name was removed. Continue taking this medication, and follow the directions you see here.   prochlorperazine 10 MG tablet Commonly known as:  COMPAZINE Take 1 tablet (10 mg total) by mouth every 6 (six)  hours as needed for nausea or vomiting.   sodium bicarbonate 650 MG tablet Take 1 tablet (650 mg total) by mouth daily.   tamsulosin 0.4 MG Caps capsule Commonly known as:  FLOMAX TAKE (1) CAPSULE DAILY, START 4 DAYS BEFORE PROCEDURE What changed:  See the new instructions.   triamcinolone 55 MCG/ACT Aero nasal inhaler Commonly known as:  NASACORT Place  1 spray into the nose at bedtime.   VENTOLIN HFA 108 (90 Base) MCG/ACT inhaler Generic drug:  albuterol Inhale into the lungs.      Allergies  Allergen Reactions  . Hydrocodone Rash  . Oxycodone Rash   Follow-up Information    [name], MD Follow up in 1 week(s).   Specialty:  Family Medicine Why:  hospital discharge follow up, repeat labs including cbc/bmp at hospital follow up. Contact information: [address] [place] [address] [phone]            The results of significant diagnostics from this hospitalization (including imaging, microbiology, ancillary and laboratory) are listed below for reference.    Significant Diagnostic Studies: Dg Chest 2 View  Result Date: 05/09/2017 CLINICAL DATA:  Cough and fever.  Lung cancer. EXAM: CHEST  2 VIEW COMPARISON:  05/07/2017 FINDINGS: Progressive enlargement of the right hilum. Given the time frame, this is most likely pneumonia or radiation pneumonitis rather than tumor progression. Left lung remains clear. No heart failure or effusion. Port-A-Cath tip in the lower SVC unchanged. IMPRESSION: Progressive enlargement of the right hilum which may be due to pneumonia or radiation change. Electronically Signed   By: Franchot Gallo M.D.   On: 05/09/2017 15:35   Dg Chest 2 View  Result Date: 05/07/2017 CLINICAL DATA:  Cough, wheezing and shortness of breath. Currently on chemotherapy for lung malignancy. EXAM: CHEST  2 VIEW COMPARISON:  Chest x-ray of March 05, 2016 and chest CT scan of March 08, 2017 FINDINGS: A right hilar soft tissue mass persists. There is new  patchy infiltrate in the left lower lobe. The heart and pulmonary vascularity are normal. The power port catheter tip projects over the junction of the middle and distal thirds of the SVC. IMPRESSION: Left lower lobe pneumonia. Fairly stable right hilar mass. Followup PA and lateral chest X-ray is recommended in 3-4 weeks following trial of antibiotic therapy to ensure resolution and exclude underlying malignancy. Electronically Signed   By: David  Martinique M.D.   On: 05/07/2017 16:43   Ct Angio Chest Pe W And/or Wo Contrast  Result Date: 05/09/2017 CLINICAL DATA:  77 year old male with history of metastatic non-small cell lung cancer on chemotherapy presenting with history of fever, weakness and shortness of breath for the past 4 weeks. EXAM: CT ANGIOGRAPHY CHEST WITH CONTRAST TECHNIQUE: Multidetector CT imaging of the chest was performed using the standard protocol during bolus administration of intravenous contrast. Multiplanar CT image reconstructions and MIPs were obtained to evaluate the vascular anatomy. CONTRAST:  113mL ISOVUE-370 IOPAMIDOL (ISOVUE-370) INJECTION 76% COMPARISON:  Chest CT 03/08/2017. FINDINGS: Cardiovascular: Today's study is limited by considerable patient respiratory motion. With this limitation in mind, there is no central, lobar or proximal segmental sized pulmonary embolism. More distal segmental or subsegmental sized emboli cannot be entirely excluded secondary to respiratory motion. Heart size is normal. There is no significant pericardial fluid, thickening or pericardial calcification. There is aortic atherosclerosis, as well as atherosclerosis of the great vessels of the mediastinum and the coronary arteries, including calcified atherosclerotic plaque in the left anterior descending coronary artery. Calcification of the aortic valve. Right internal jugular single-lumen porta cath with tip terminating at the superior cavoatrial junction. Mediastinum/Nodes: Extensive soft tissue  thickening in the right hilar region likely related to prior radiation therapy and similar to the prior examination. No pathologically enlarged mediastinal or left hilar lymph nodes. Esophagus is unremarkable in appearance. No axillary lymphadenopathy. Lungs/Pleura: Increasing mass-like architectural distortion throughout the medial  aspect of the right lung emanating outward from the hilar region, likely to reflect progressive postradiation mass-like fibrosis. No definite suspicious appearing pulmonary nodule or mass to strongly suggest recurrent or metastatic disease in the lungs. Several nodular areas of architectural distortion in the periphery of the left lung appear stable compared to prior examinations, most compatible with areas of post infectious or inflammatory scarring. No acute consolidative airspace disease. No pleural effusions. Diffuse bronchial wall thickening with mild centrilobular and paraseptal emphysema. Upper Abdomen: Aortic atherosclerosis. 1.8 cm right adrenal nodule is unchanged. Left adrenal mass has clearly enlarged, currently measuring 3.3 cm in diameter. Musculoskeletal: There are no aggressive appearing lytic or blastic lesions noted in the visualized portions of the skeleton. Review of the MIP images confirms the above findings. IMPRESSION: 1. Although today's study is limited by considerable patient respiratory motion there is no evidence of central, lobar or proximal segmental sized pulmonary embolism. 2. Evolving chronic postradiation changes of mass-like fibrosis throughout the medial aspect of the right lung. 3. 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 4. Aortic atherosclerosis, in addition to left anterior descending coronary artery disease. 5. There are calcifications of the aortic valve and mitral annulus. Echocardiographic correlation for evaluation of potential valvular dysfunction may be warranted if clinically indicated. 6.  Mild diffuse bronchial thickening with mild centrilobular and paraseptal emphysema; imaging findings suggestive of underlying COPD. Aortic Atherosclerosis (ICD10-I70.0) and Emphysema (ICD10-J43.9). Electronically Signed   By: Vinnie Langton M.D.   On: 05/09/2017 18:57    Microbiology: Recent Results (from the past 240 hour(s))  Blood Culture (routine x 2)     Status: None (Preliminary result)   Collection Time: 05/09/17 12:17 PM  Result Value Ref Range Status   Specimen Description BLOOD LEFT FOREARM  Final   Special Requests   Final    BOTTLES DRAWN AEROBIC AND ANAEROBIC Blood Culture adequate volume   Culture   Final    NO GROWTH 2 DAYS Performed at [facility], [address], [place], [place] [address]    Report Status PENDING  Incomplete  Blood Culture (routine x 2)     Status: None (Preliminary result)   Collection Time: 05/09/17 12:41 PM  Result Value Ref Range Status   Specimen Description BLOOD LEFT FOREARM  Final   Special Requests   Final    BOTTLES DRAWN AEROBIC AND ANAEROBIC Blood Culture adequate volume   Culture   Final    NO GROWTH 2 DAYS Performed at [facility], [place] [address], [place], [place] [address]    Report Status PENDING  Incomplete  Respiratory Panel by PCR     Status: Abnormal   Collection Time: 05/09/17  3:42 PM  Result Value Ref Range Status   Adenovirus NOT DETECTED NOT DETECTED Final   Coronavirus 229E NOT DETECTED NOT DETECTED Final   Coronavirus HKU1 NOT DETECTED NOT DETECTED Final   Coronavirus NL63 NOT DETECTED NOT DETECTED Final   Coronavirus OC43 NOT DETECTED NOT DETECTED Final   Metapneumovirus NOT DETECTED NOT DETECTED Final   Rhinovirus / Enterovirus NOT DETECTED NOT DETECTED Final   Influenza A NOT DETECTED NOT DETECTED Final   Influenza B NOT DETECTED NOT DETECTED Final   Parainfluenza Virus 1 NOT DETECTED NOT DETECTED Final   Parainfluenza Virus 2 NOT DETECTED NOT DETECTED Final   Parainfluenza Virus 3 NOT DETECTED  NOT DETECTED Final   Parainfluenza Virus 4 NOT DETECTED NOT DETECTED Final  Respiratory Syncytial Virus DETECTED (A) NOT DETECTED Final    Comment: CRITICAL RESULT CALLED TO, READ BACK BY AND VERIFIED WITH: Kathi Simpers RN 10:35 05/10/17 ([email or web address])    Bordetella pertussis NOT DETECTED NOT DETECTED Final   Chlamydophila pneumoniae NOT DETECTED NOT DETECTED Final   Mycoplasma pneumoniae NOT DETECTED NOT DETECTED Final  C difficile quick scan w PCR reflex     Status: None   Collection Time: 05/10/17 10:35 AM  Result Value Ref Range Status   C Diff antigen NEGATIVE NEGATIVE Final   C Diff toxin NEGATIVE NEGATIVE Final   C Diff interpretation No C. difficile detected.  Final  MRSA PCR Screening     Status: Abnormal   Collection Time: 05/10/17  5:31 PM  Result Value Ref Range Status   MRSA by PCR POSITIVE (A) NEGATIVE Final    Comment:        The GeneXpert MRSA Assay (FDA approved for NASAL specimens only), is one component of a comprehensive MRSA colonization surveillance program. It is not intended to diagnose MRSA infection nor to guide or monitor treatment for MRSA infections. RESULT CALLED TO, READ BACK BY AND VERIFIED WITH: ASHLEY SAWYERS,RN [id number] @ 2232 BY J SCOTTON      Labs: Basic Metabolic Panel: Recent Labs  Lab 05/07/17 1200 05/09/17 1217 05/10/17 0521 05/11/17 0305 05/12/17 0403  NA 134* 130* 132* 132* 133*  K 4.5 4.3 4.2 5.0 4.8  CL 100 100* 104 104 104  CO2 21* 17* 19* 19* 18*  GLUCOSE 164* 170* 121* 221* 294*  BUN 28* 22* 16 18 32*  CREATININE  --  1.28* 1.15 1.00 1.16  CALCIUM 8.6 7.7* 7.1* 7.2* 7.4*  MG 1.0*  --   --   --   --    Liver Function Tests: Recent Labs  Lab 05/07/17 1200 05/09/17 1217 05/10/17 0521  AST 55* 46* 39  ALT 52 39 29  ALKPHOS 82 78 67  BILITOT 0.7 0.8 1.1  PROT 8.0 7.5 6.4*  ALBUMIN 3.5 3.5 2.8*   No results for input(s): LIPASE, AMYLASE in the last 168 hours. No results for input(s): AMMONIA in the last 168  hours. CBC: Recent Labs  Lab 05/07/17 1200 05/09/17 1217 05/10/17 0556 05/11/17 0305 05/12/17 0403  WBC  --  3.3* 3.2* 2.5* 4.0  NEUTROABS 1.5 1.8 2.0 2.2 3.3  HGB  --  8.7* 7.6* 8.2* 8.2*  HCT 30.5* 24.6* 22.1* 23.4* 23.7*  MCV 100.7* 99.6 100.0 95.5 96.0  PLT  --  52* 34* 33* 41*   Cardiac Enzymes: No results for input(s): CKTOTAL, CKMB, CKMBINDEX, TROPONINI in the last 168 hours. BNP: BNP (last 3 results) No results for input(s): BNP in the last 8760 hours.  ProBNP (last 3 results) No results for input(s): PROBNP in the last 8760 hours.  CBG: No results for input(s): GLUCAP in the last 168 hours.     Signed:  Florencia Reasons MD, PhD  [facility] 05/12/2017, 9:45 AM

## 2017-05-14 ENCOUNTER — Other Ambulatory Visit: Payer: Medicare Other

## 2017-05-14 LAB — CULTURE, BLOOD (ROUTINE X 2)
CULTURE: NO GROWTH
Culture: NO GROWTH
Special Requests: ADEQUATE
Special Requests: ADEQUATE

## 2017-05-17 ENCOUNTER — Ambulatory Visit (HOSPITAL_COMMUNITY)
Admission: RE | Admit: 2017-05-17 | Discharge: 2017-05-17 | Disposition: A | Discharge status: Home / Self Care | Payer: Medicare Other | Source: Ambulatory Visit | Attending: Internal Medicine | Admitting: Internal Medicine

## 2017-05-17 DIAGNOSIS — C3490 Malignant neoplasm of unspecified part of unspecified bronchus or lung: Secondary | ICD-10-CM | POA: Diagnosis present

## 2017-05-17 DIAGNOSIS — C7972 Secondary malignant neoplasm of left adrenal gland: Secondary | ICD-10-CM | POA: Insufficient documentation

## 2017-05-17 DIAGNOSIS — N400 Enlarged prostate without lower urinary tract symptoms: Secondary | ICD-10-CM | POA: Insufficient documentation

## 2017-05-17 DIAGNOSIS — I723 Aneurysm of iliac artery: Secondary | ICD-10-CM | POA: Insufficient documentation

## 2017-05-17 DIAGNOSIS — K5730 Diverticulosis of large intestine without perforation or abscess without bleeding: Secondary | ICD-10-CM | POA: Diagnosis not present

## 2017-05-17 DIAGNOSIS — I2510 Atherosclerotic heart disease of native coronary artery without angina pectoris: Secondary | ICD-10-CM | POA: Insufficient documentation

## 2017-05-17 DIAGNOSIS — C7971 Secondary malignant neoplasm of right adrenal gland: Secondary | ICD-10-CM | POA: Insufficient documentation

## 2017-05-17 DIAGNOSIS — I700 Atherosclerosis of aorta: Secondary | ICD-10-CM | POA: Diagnosis not present

## 2017-05-17 MED ORDER — IOPAMIDOL (ISOVUE-300) INJECTION 61%
INTRAVENOUS | Status: AC
  Filled 2017-05-17: qty 100

## 2017-05-17 MED ORDER — IOPAMIDOL (ISOVUE-300) INJECTION 61%
100.0000 mL | Freq: Once | INTRAVENOUS | Status: AC | PRN
  Administered 2017-05-17: 100 mL via INTRAVENOUS

## 2017-05-21 ENCOUNTER — Telehealth: Payer: Self-pay | Admitting: Oncology

## 2017-05-21 ENCOUNTER — Inpatient Hospital Stay: Payer: Medicare Other

## 2017-05-21 ENCOUNTER — Inpatient Hospital Stay (HOSPITAL_BASED_OUTPATIENT_CLINIC_OR_DEPARTMENT_OTHER): Payer: Medicare Other | Admitting: Internal Medicine

## 2017-05-21 ENCOUNTER — Encounter: Payer: Self-pay | Admitting: Internal Medicine

## 2017-05-21 VITALS — BP 141/95 | HR 120 | Temp 97.4°F | Resp 16 | Ht 73.0 in | Wt 164.6 lb

## 2017-05-21 DIAGNOSIS — C7971 Secondary malignant neoplasm of right adrenal gland: Secondary | ICD-10-CM | POA: Diagnosis not present

## 2017-05-21 DIAGNOSIS — Z95828 Presence of other vascular implants and grafts: Secondary | ICD-10-CM

## 2017-05-21 DIAGNOSIS — C3491 Malignant neoplasm of unspecified part of right bronchus or lung: Secondary | ICD-10-CM

## 2017-05-21 DIAGNOSIS — E860 Dehydration: Secondary | ICD-10-CM

## 2017-05-21 DIAGNOSIS — Z923 Personal history of irradiation: Secondary | ICD-10-CM

## 2017-05-21 DIAGNOSIS — C3490 Malignant neoplasm of unspecified part of unspecified bronchus or lung: Secondary | ICD-10-CM | POA: Diagnosis not present

## 2017-05-21 DIAGNOSIS — Z5111 Encounter for antineoplastic chemotherapy: Secondary | ICD-10-CM

## 2017-05-21 DIAGNOSIS — C7972 Secondary malignant neoplasm of left adrenal gland: Secondary | ICD-10-CM | POA: Diagnosis not present

## 2017-05-21 DIAGNOSIS — R000 Tachycardia, unspecified: Secondary | ICD-10-CM | POA: Diagnosis not present

## 2017-05-21 DIAGNOSIS — T451X5A Adverse effect of antineoplastic and immunosuppressive drugs, initial encounter: Secondary | ICD-10-CM

## 2017-05-21 DIAGNOSIS — J449 Chronic obstructive pulmonary disease, unspecified: Secondary | ICD-10-CM | POA: Diagnosis not present

## 2017-05-21 DIAGNOSIS — K1230 Oral mucositis (ulcerative), unspecified: Secondary | ICD-10-CM | POA: Diagnosis not present

## 2017-05-21 DIAGNOSIS — R05 Cough: Secondary | ICD-10-CM | POA: Diagnosis not present

## 2017-05-21 DIAGNOSIS — R062 Wheezing: Secondary | ICD-10-CM | POA: Diagnosis not present

## 2017-05-21 DIAGNOSIS — D6481 Anemia due to antineoplastic chemotherapy: Secondary | ICD-10-CM

## 2017-05-21 DIAGNOSIS — J181 Lobar pneumonia, unspecified organism: Secondary | ICD-10-CM | POA: Diagnosis present

## 2017-05-21 DIAGNOSIS — R197 Diarrhea, unspecified: Secondary | ICD-10-CM | POA: Diagnosis not present

## 2017-05-21 LAB — COMPREHENSIVE METABOLIC PANEL
ALT: 24 U/L (ref 0–55)
ANION GAP: 14 — AB (ref 3–11)
AST: 28 U/L (ref 5–34)
Albumin: 3.2 g/dL — ABNORMAL LOW (ref 3.5–5.0)
Alkaline Phosphatase: 104 U/L (ref 40–150)
BUN: 17 mg/dL (ref 7–26)
CHLORIDE: 99 mmol/L (ref 98–109)
CO2: 22 mmol/L (ref 22–29)
CREATININE: 1.16 mg/dL (ref 0.70–1.30)
Calcium: 8.5 mg/dL (ref 8.4–10.4)
GFR, EST NON AFRICAN AMERICAN: 59 mL/min — AB (ref >60)
Glucose, Bld: 251 mg/dL — ABNORMAL HIGH (ref 70–140)
POTASSIUM: 4 mmol/L (ref 3.5–5.1)
SODIUM: 135 mmol/L — AB (ref 136–145)
Total Bilirubin: 0.5 mg/dL (ref 0.2–1.2)
Total Protein: 7.2 g/dL (ref 6.4–8.3)

## 2017-05-21 LAB — CBC WITH DIFFERENTIAL/PLATELET
BASOS ABS: 0.1 10*3/uL (ref 0.0–0.1)
Basophils Relative: 1 %
Eosinophils Absolute: 0 10*3/uL (ref 0.0–0.5)
Eosinophils Relative: 1 %
HEMATOCRIT: 31.6 % — AB (ref 38.4–49.9)
Hemoglobin: 10.7 g/dL — ABNORMAL LOW (ref 13.0–17.1)
LYMPHS ABS: 0.9 10*3/uL (ref 0.9–3.3)
LYMPHS PCT: 10 %
MCH: 33.5 pg — ABNORMAL HIGH (ref 27.2–33.4)
MCHC: 33.9 g/dL (ref 32.0–36.0)
MCV: 98.7 fL — AB (ref 79.3–98.0)
Monocytes Absolute: 1.8 10*3/uL — ABNORMAL HIGH (ref 0.1–0.9)
Monocytes Relative: 20 %
NEUTROS ABS: 6 10*3/uL (ref 1.5–6.5)
Neutrophils Relative %: 68 %
Platelets: 112 10*3/uL — ABNORMAL LOW (ref 140–400)
RBC: 3.2 MIL/uL — AB (ref 4.20–5.82)
RDW: 20.9 % — ABNORMAL HIGH (ref 11.0–15.6)
WBC: 8.8 10*3/uL (ref 4.0–10.3)

## 2017-05-21 MED ORDER — SODIUM CHLORIDE 0.9 % IV SOLN
INTRAVENOUS | Status: AC
  Administered 2017-05-21: 11:00:00 via INTRAVENOUS

## 2017-05-21 MED ORDER — SODIUM CHLORIDE 0.9% FLUSH
10.0000 mL | Freq: Once | INTRAVENOUS | Status: AC
  Administered 2017-05-21: 10 mL
  Filled 2017-05-21: qty 10

## 2017-05-21 MED ORDER — HEPARIN SOD (PORK) LOCK FLUSH 100 UNIT/ML IV SOLN
500.0000 [IU] | Freq: Once | INTRAVENOUS | Status: AC
  Administered 2017-05-21: 500 [IU]
  Filled 2017-05-21: qty 5

## 2017-05-21 NOTE — Progress Notes (Signed)
DISCONTINUE ON PATHWAY REGIMEN - Non-Small Cell Lung     A cycle is every 21 days:     Carboplatin      Pemetrexed      Bevacizumab   **Always confirm dose/schedule in your pharmacy ordering system**    REASON: Other Reason PRIOR TREATMENT: [id number]: Carboplatin AUC=5 + Pemetrexed 500 mg/m2 + Bevacizumab 15 mg/kg q21 Days x 4 Cycles TREATMENT RESPONSE: Stable Disease (SD)  START ON PATHWAY REGIMEN - Non-Small Cell Lung     A cycle is every 21 days:     Pemetrexed   **Always confirm dose/schedule in your pharmacy ordering system**    Patient Characteristics: Stage IV Metastatic, Nonsquamous, Maintenance - Chemotherapy/Immunotherapy, PS = 0, 1, Initial Paclitaxel + Carboplatin or Initial Pemetrexed + Platinum Agent AJCC T Category: T1a Current Disease Status: Distant Metastases AJCC N Category: N1 AJCC M Category: M1c AJCC 8 Stage Grouping: IVB Histology: Nonsquamous Cell ROS1 Rearrangement Status: Negative T790M Mutation Status: Not Applicable - EGFR Mutation Negative/Unknown Other Mutations/Biomarkers: No Other Actionable Mutations PD-L1 Expression Status: Quantity Not Sufficient Chemotherapy/Immunotherapy LOT: Maintenance Chemotherapy/Immunotherapy Molecular Targeted Therapy: Not Appropriate ALK Translocation Status: Negative Would you be surprised if this patient died  in the next year<= I would NOT be surprised if this patient died in the next year EGFR Mutation Status: Negative/Wild Type BRAF V600E Mutation Status: Negative Performance Status: PS = 0, 1 Intent of Therapy: Non-Curative / Palliative Intent, Discussed with Patient

## 2017-05-21 NOTE — Progress Notes (Signed)
[place] [phone]   [phone]  OFFICE PROGRESS NOTE  [name], MD [place] [address]  DIAGNOSIS: Metastatic non-small cell lung cancer, adenocarcinoma initially diagnosed as Unresectable stage IIA (T1a, N1, M0) non-small cell lung cancer, adenocarcinoma presented with a right hilar mass and right hilar adenopathy diagnosed in September 2017. [facility]: Negative for EGFR, ALK, ROS 1 and BRAF mutations.  PRIOR THERAPY:  1) Status post concurrent chemoradiation with weekly carboplatin for AUC of 2 and paclitaxel 45 MG/M2 for 7 weeks. 2) Systemic chemotherapy with carboplatin for AUC of 5, Alimta 500 MG/M2 and Avastin 15 MG/KG every 3 weeks. First dose 01/08/2017. Status post 6 cycles.  Last dose was given April 30, 2002 stable disease.   CURRENT THERAPY: Maintenance treatment with Alimta 500 mg/M2 and Avastin 15 mg/KG every 3 weeks.  First dose June 11, 2017.  INTERVAL HISTORY: Mark Howell 77 y.o. male returns to the clinic today for follow-up visit accompanied by his wife and son.  The patient continues to complain of increasing fatigue and weakness.  He was recently admitted to [facility] with pneumonia and treated with a course of antibiotics as well as a taper dose of prednisone.  He is currently on prednisone 5 mg p.o. twice daily.  He still have chest congestion and not eating well.  He also has cough productive of yellowish sputum.  He denied having any current chest pain but has shortness breath with exertion with no hemoptysis.  He denied having any fever or chills.  He had repeat CT scan of the chest, abdomen and pelvis performed recently and is here for evaluation and discussion of his discuss results.   MEDICAL HISTORY: Past Medical History:  Diagnosis Date  . Arthritis   . Complication of anesthesia    problems voiding post op  . Full dentures   . GERD (gastroesophageal reflux  disease)   . History of radiation therapy 01/25/16-02/28/16   right lung 45 Gy  . History of radiation therapy 04/17/16-05/02/16   right lung boost 20 Gy in 10 fractions cumulative dose 65 gray  . HOH (hard of hearing)   . HTN (hypertension)   . Hyperlipidemia   . Lung mass 12/30/2015  . Odynophagia 04/06/2016  . Psoriatic arthritis ([place])   . Snores   . Wears glasses     ALLERGIES:  is allergic to hydrocodone and oxycodone.  MEDICATIONS:  Current Outpatient Medications  Medication Sig Dispense Refill  . acetaminophen (TYLENOL) 500 MG tablet Take 500-1,000 mg by mouth 2 (two) times daily as needed for mild pain.    Marland Kitchen albuterol (VENTOLIN HFA) 108 (90 Base) MCG/ACT inhaler Inhale into the lungs.    Marland Kitchen aspirin EC 81 MG tablet Take 81 mg by mouth at bedtime.     Marland Kitchen aspirin-sod bicarb-citric acid (ALKA-SELTZER) 325 MG TBEF tablet Take 650 mg by mouth daily as needed (indigestion).     . benzonatate (TESSALON) 100 MG capsule Take 2 capsules (200 mg total) by mouth 3 (three) times daily as needed for cough. 45 capsule 0  . calcium carbonate (TUMS - DOSED IN MG ELEMENTAL CALCIUM) 500 MG chewable tablet Chew 1 tablet by mouth daily.    . Cetirizine HCl 10 MG TBDP Take by mouth.    . Coenzyme Q10 (CO Q 10 PO) Take 1 tablet by mouth daily.    Marland Kitchen dexamethasone (DECADRON) 4 MG tablet 4 mg by mouth twice a  day the day before, day of and day after chemotherapy every 3 weeks. 40 tablet 1  . diphenhydramine-acetaminophen (TYLENOL PM) 25-500 MG TABS Take 1 tablet by mouth at bedtime. For sleep    . diphenoxylate-atropine (LOMOTIL) 2.5-0.025 MG tablet Take 2 tablets by mouth 4 (four) times daily as needed for diarrhea or loose stools. 40 tablet 1  . folic acid (FOLVITE) 1 MG tablet Take 1 tablet (1 mg total) by mouth daily. 30 tablet 4  . guaiFENesin (MUCINEX) 600 MG 12 hr tablet Take 1 tablet (600 mg total) by mouth 2 (two) times daily. 60 tablet 0  . lidocaine (XYLOCAINE) 2 % solution Use as directed 20 mLs  in the mouth or throat every 3 (three) hours as needed for mouth pain. 100 mL 2  . lidocaine-prilocaine (EMLA) cream Apply 1 application topically as needed. (Patient taking differently: Apply 1 application topically as needed (port access). ) 30 g 0  . loperamide (IMODIUM A-D) 2 MG tablet Take 2 mg by mouth daily as needed for diarrhea or loose stools.    Marland Kitchen LORazepam (ATIVAN) 0.5 MG tablet Take 1 tablet (0.5 mg total) by mouth 2 (two) times daily as needed for anxiety. 30 tablet 0  . Lutein 10 MG TABS Take 10 mg by mouth at bedtime.     . magic mouthwash SOLN Take 5 mLs by mouth 4 (four) times daily as needed for mouth pain. (Patient taking differently: Take 5 mLs by mouth 4 (four) times daily as needed for mouth pain. SWISH AND SPIT OUT) 240 mL 2  . Methylcobalamin (METHYL B-12 PO) Take 1 tablet by mouth daily.    . metoprolol tartrate (LOPRESSOR) 25 MG tablet Take 0.5 tablets (12.5 mg total) by mouth 2 (two) times daily. 60 tablet 0  . Multiple Vitamin (MULITIVITAMIN WITH MINERALS) TABS Take 1 tablet by mouth daily.    Marland Kitchen nystatin (MYCOSTATIN/NYSTOP) powder Apply topically 3 (three) times daily. Apply to groin rash. 15 g 0  . pantoprazole (PROTONIX) 40 MG tablet Take 40 mg by mouth daily.     . pravastatin (PRAVACHOL) 40 MG tablet Take 40 mg by mouth at bedtime.     . predniSONE (DELTASONE) 10 MG tablet Label  & dispense according to the schedule below. 6 Pills PO on day one then, 5 Pills PO on day two, 4 Pills PO on day three, 3Pills PO on day four, 2 Pills PO on day five,  1 Pills PO on day six,  then go back on your previous prednisone regimen. Total of 21 tabs 21 tablet 0  . predniSONE (DELTASONE) 5 MG tablet Take 1 tablet (5 mg total) by mouth 2 (two) times daily with a meal. Start after 6 days of steroids taper. 30 tablet 0  . prochlorperazine (COMPAZINE) 10 MG tablet Take 1 tablet (10 mg total) by mouth every 6 (six) hours as needed for nausea or vomiting. 30 tablet 0  . sodium bicarbonate  650 MG tablet Take 1 tablet (650 mg total) by mouth daily. 30 tablet 0  . tamsulosin (FLOMAX) 0.4 MG CAPS capsule TAKE (1) CAPSULE DAILY, START 4 DAYS BEFORE PROCEDURE (Patient taking differently: take 1 capsule by mouth every morning) 30 capsule 2  . triamcinolone (NASACORT) 55 MCG/ACT AERO nasal inhaler Place 1 spray into the nose at bedtime.      Current Facility-Administered Medications  Medication Dose Route Frequency Provider Last Rate Last Dose  . gi cocktail (Maalox,Lidocaine,Donnatal)  30 mL Oral Once Harle Stanford.,  PA-C        SURGICAL HISTORY:  Past Surgical History:  Procedure Laterality Date  . COLONOSCOPY    . HEMORRHOID SURGERY N/A 10/01/2013   Procedure: EXAM UNDER ANESTHESIA  AND EXCISIONAL HEMORRHOIDECTOMY WITH HEMORRHOID BANDING X 2;  Surgeon: Gayland Curry, MD;  Location: [place];  Service: General;  Laterality: N/A;  . INGUINAL HERNIA REPAIR  01/04/2012   Procedure: LAPAROSCOPIC INGUINAL HERNIA;  Surgeon: Gayland Curry, MD,FACS;  Location: [facility];  Service: General;  Laterality: Right;  . IR FLUORO GUIDE PORT INSERTION RIGHT  01/02/2017  . IR US GUIDE VASC ACCESS RIGHT  01/02/2017  . VIDEO BRONCHOSCOPY WITH ENDOBRONCHIAL ULTRASOUND N/A 12/31/2015   Procedure: VIDEO BRONCHOSCOPY WITH ENDOBRONCHIAL ULTRASOUND transbronchial biopsy of node 10 R lymph node;  Surgeon: Grace Isaac, MD;  Location: [place];  Service: Thoracic;  Laterality: N/A;    REVIEW OF SYSTEMS:  Constitutional: positive for anorexia and fatigue Eyes: negative Ears, nose, mouth, throat, and face: negative Respiratory: positive for cough, dyspnea on exertion and wheezing Cardiovascular: negative Gastrointestinal: negative Genitourinary:negative Integument/breast: negative Hematologic/lymphatic: negative Musculoskeletal:positive for arthralgias and muscle weakness Neurological: negative Behavioral/Psych: negative Endocrine: negative Allergic/Immunologic: negative   PHYSICAL  EXAMINATION: General appearance: alert, cooperative, fatigued and no distress Head: Normocephalic, without obvious abnormality, atraumatic Neck: no adenopathy, no JVD, supple, symmetrical, trachea midline and thyroid not enlarged, symmetric, no tenderness/mass/nodules Lymph nodes: Cervical, supraclavicular, and axillary nodes normal. Resp: wheezes bilaterally Back: symmetric, no curvature. ROM normal. No CVA tenderness. Cardio: regular rate and rhythm, S1, S2 normal, no murmur, click, rub or gallop GI: soft, non-tender; bowel sounds normal; no masses,  no organomegaly Extremities: extremities normal, atraumatic, no cyanosis or edema Neurologic: Alert and oriented X 3, normal strength and tone. Normal symmetric reflexes. Normal coordination and gait  ECOG PERFORMANCE STATUS: 1 - Symptomatic but completely ambulatory  Blood pressure (!) 141/95, pulse (!) 120, temperature (!) 97.4 F (36.3 C), temperature source Axillary, resp. rate 16, height '6\' 1"'$  (1.854 m), weight 164 lb 9.6 oz (74.7 kg), SpO2 100 %.  LABORATORY DATA: Lab Results  Component Value Date   WBC 8.8 05/21/2017   HGB 10.7 (L) 05/21/2017   HCT 31.6 (L) 05/21/2017   MCV 98.7 (H) 05/21/2017   PLT 112 (L) 05/21/2017      Chemistry      Component Value Date/Time   NA 135 (L) 05/21/2017 0919   NA 135 (L) 04/30/2017 0824   K 4.0 05/21/2017 0919   K 4.4 04/30/2017 0824   CL 99 05/21/2017 0919   CO2 22 05/21/2017 0919   CO2 19 (L) 04/30/2017 0824   BUN 17 05/21/2017 0919   BUN 28.0 (H) 04/30/2017 0824   CREATININE 1.16 05/21/2017 0919   CREATININE 1.5 (H) 04/30/2017 0824      Component Value Date/Time   CALCIUM 8.5 05/21/2017 0919   CALCIUM 7.6 (L) 04/30/2017 0824   ALKPHOS 104 05/21/2017 0919   ALKPHOS 88 04/30/2017 0824   AST 28 05/21/2017 0919   AST 55 (H) 05/07/2017 1200   AST 22 04/30/2017 0824   ALT 24 05/21/2017 0919   ALT 52 05/07/2017 1200   ALT 19 04/30/2017 0824   BILITOT 0.5 05/21/2017 0919   BILITOT  0.7 05/07/2017 1200   BILITOT 0.57 04/30/2017 0824       RADIOGRAPHIC STUDIES: Dg Chest 2 View  Result Date: 05/09/2017 CLINICAL DATA:  Cough and fever.  Lung cancer. EXAM: CHEST  2 VIEW COMPARISON:  05/07/2017 FINDINGS: Progressive enlargement of the right hilum. Given the time frame, this is most likely pneumonia or radiation pneumonitis rather than tumor progression. Left lung remains clear. No heart failure or effusion. Port-A-Cath tip in the lower SVC unchanged. IMPRESSION: Progressive enlargement of the right hilum which may be due to pneumonia or radiation change. Electronically Signed   By: Franchot Gallo M.D.   On: 05/09/2017 15:35   Dg Chest 2 View  Result Date: 05/07/2017 CLINICAL DATA:  Cough, wheezing and shortness of breath. Currently on chemotherapy for lung malignancy. EXAM: CHEST  2 VIEW COMPARISON:  Chest x-ray of March 05, 2016 and chest CT scan of March 08, 2017 FINDINGS: A right hilar soft tissue mass persists. There is new patchy infiltrate in the left lower lobe. The heart and pulmonary vascularity are normal. The power port catheter tip projects over the junction of the middle and distal thirds of the SVC. IMPRESSION: Left lower lobe pneumonia. Fairly stable right hilar mass. Followup PA and lateral chest X-ray is recommended in 3-4 weeks following trial of antibiotic therapy to ensure resolution and exclude underlying malignancy. Electronically Signed   By: David  Martinique M.D.   On: 05/07/2017 16:43   Ct Angio Chest Pe W And/or Wo Contrast  Result Date: 05/09/2017 CLINICAL DATA:  77 year old male with history of metastatic non-small cell lung cancer on chemotherapy presenting with history of fever, weakness and shortness of breath for the past 4 weeks. EXAM: CT ANGIOGRAPHY CHEST WITH CONTRAST TECHNIQUE: Multidetector CT imaging of the chest was performed using the standard protocol during bolus administration of intravenous contrast. Multiplanar CT image reconstructions and  MIPs were obtained to evaluate the vascular anatomy. CONTRAST:  144m ISOVUE-370 IOPAMIDOL (ISOVUE-370) INJECTION 76% COMPARISON:  Chest CT 03/08/2017. FINDINGS: Cardiovascular: Today's study is limited by considerable patient respiratory motion. With this limitation in mind, there is no central, lobar or proximal segmental sized pulmonary embolism. More distal segmental or subsegmental sized emboli cannot be entirely excluded secondary to respiratory motion. Heart size is normal. There is no significant pericardial fluid, thickening or pericardial calcification. There is aortic atherosclerosis, as well as atherosclerosis of the great vessels of the mediastinum and the coronary arteries, including calcified atherosclerotic plaque in the left anterior descending coronary artery. Calcification of the aortic valve. Right internal jugular single-lumen porta cath with tip terminating at the superior cavoatrial junction. Mediastinum/Nodes: Extensive soft tissue thickening in the right hilar region likely related to prior radiation therapy and similar to the prior examination. No pathologically enlarged mediastinal or left hilar lymph nodes. Esophagus is unremarkable in appearance. No axillary lymphadenopathy. Lungs/Pleura: Increasing mass-like architectural distortion throughout the medial aspect of the right lung emanating outward from the hilar region, likely to reflect progressive postradiation mass-like fibrosis. No definite suspicious appearing pulmonary nodule or mass to strongly suggest recurrent or metastatic disease in the lungs. Several nodular areas of architectural distortion in the periphery of the left lung appear stable compared to prior examinations, most compatible with areas of post infectious or inflammatory scarring. No acute consolidative airspace disease. No pleural effusions. Diffuse bronchial wall thickening with mild centrilobular and paraseptal emphysema. Upper Abdomen: Aortic atherosclerosis.  1.8 cm right adrenal nodule is unchanged. Left adrenal mass has clearly enlarged, currently measuring 3.3 cm in diameter. Musculoskeletal: There are no aggressive appearing lytic or blastic lesions noted in the visualized portions of the skeleton. Review of the MIP images confirms the above findings. IMPRESSION: 1. Although today's study is limited by considerable patient respiratory motion  there is no evidence of central, lobar or proximal segmental sized pulmonary embolism. 2. Evolving chronic postradiation changes of mass-like fibrosis throughout the medial aspect of the right lung. 3. 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 4. Aortic atherosclerosis, in addition to left anterior descending coronary artery disease. 5. There are calcifications of the aortic valve and mitral annulus. Echocardiographic correlation for evaluation of potential valvular dysfunction may be warranted if clinically indicated. 6. Mild diffuse bronchial thickening with mild centrilobular and paraseptal emphysema; imaging findings suggestive of underlying COPD. Aortic Atherosclerosis (ICD10-I70.0) and Emphysema (ICD10-J43.9). Electronically Signed   By: Vinnie Langton M.D.   On: 05/09/2017 18:57   Ct Abdomen Pelvis W Contrast  Result Date: 05/17/2017 CLINICAL DATA:  77 year old male with history of lung cancer diagnosed in 2017 status post chemotherapy and radiation therapy now completed. Restaging examination. EXAM: CT ABDOMEN AND PELVIS WITH CONTRAST TECHNIQUE: Multidetector CT imaging of the abdomen and pelvis was performed using the standard protocol following bolus administration of intravenous contrast. CONTRAST:  132m ISOVUE-300 IOPAMIDOL (ISOVUE-300) INJECTION 61% COMPARISON:  CT of the abdomen and pelvis 03/08/2017. FINDINGS: Lower chest: Atherosclerotic calcifications in the left anterior descending and right coronary arteries. Calcifications of the aortic valve.  Postradiation changes in the medial aspect of the right lung, similar to recent chest CT. Hepatobiliary: No cystic or solid hepatic lesions. No intra or extrahepatic biliary ductal dilatation. Gallbladder is normal in appearance. Pancreas: No pancreatic mass. No pancreatic ductal dilatation. No pancreatic or peripancreatic fluid or inflammatory changes. Spleen: Unremarkable. 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 Multiple nonobstructive calculi are noted within the collecting systems of both kidneys, measuring up to 6 mm in the interpolar region of the right kidney. Multifocal cortical thinning, compatible with chronic scarring in both kidneys. 13 mm simple cyst in the lower pole of the left kidney. No hydroureteronephrosis. Urinary bladder is partially decompressed, but is otherwise unremarkable in appearance. Stomach/Bowel: Normal appearance of the stomach. No pathologic dilatation of small bowel or colon. Numerous colonic diverticulae are noted, without surrounding inflammatory changes to suggest an acute diverticulitis at this time. Normal appendix. 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 No lymphadenopathy noted in the abdomen or pelvis. Reproductive: Prostate gland is enlarged and heterogeneous in appearance with median lobe hypertrophy measuring 3.9 x 5.3 x 5.5 cm. Seminal vesicles are unremarkable in appearance. Other: No significant volume of ascites.  No pneumoperitoneum. Musculoskeletal: There are no aggressive appearing lytic or blastic lesions noted in the visualized portions of the skeleton. IMPRESSION: 1. Bilateral adrenal metastases redemonstrated, as above. 2. No new signs of metastatic disease elsewhere in the abdomen or pelvis. 3. Aortic atherosclerosis with coronary artery disease and  aneurysmal dilatation of the common iliac arteries measuring up to 17 mm in diameter bilaterally. 4. Colonic diverticulosis without evidence of acute diverticulitis at this time. 5. Prostatomegaly with median lobe hypertrophy of the prostate gland and mild thickening of the urinary bladder, which could suggest chronic bladder outlet obstruction. 6. There are calcifications of the aortic valve. Echocardiographic correlation for evaluation of potential valvular dysfunction may be warranted if clinically indicated. 7. Additional incidental findings, as above. Aortic Atherosclerosis (ICD10-I70.0). Electronically Signed   By: DVinnie LangtonM.D.   On: 05/17/2017 15:10    ASSESSMENT AND PLAN:  This  is a very pleasant 77 years old white male with unresectable a stage II a non-small cell lung cancer, adenocarcinoma presented with right hilar mass status post a course of concurrent chemoradiation with weekly carboplatin and paclitaxel. He has partial response to this treatment. The patient has been observation for the last few months. The patient developed disease progression and he was started on systemic chemotherapy with carboplatin, Alimta and Avastin status post 6 cycles. The patient tolerated the previous treatment fairly well except for increasing fatigue. He was recently admitted to [facility] and treated for pneumonia.  He is recovering slowly from his recent admission. Repeat CT scan of the chest, abdomen and pelvis showed a stable disease.  He has no metastatic or progressive lesions. I personally and independently reviewed the scan images and discussed the results with the patient and his family. I recommended for the patient to consider treatment with maintenance systemic chemotherapy with Alimta 500 mg/M2 and Avastin 15 mg/KG after this induction phase of treatment.  He was also given the option of observation. The patient is interested in the maintenance therapy but we will start the  first cycle of this treatment in 3 weeks to give him more time to recover from his recent hospitalization with pneumonia. For the dehydration and poor appetite, I will arrange for the patient to receive 1 L of normal saline today. He will come back for follow-up visit in 3 weeks before starting the next cycle of his treatment. The patient was advised to call immediately if he has any concerning symptoms in the interval. The patient voices understanding of current disease status and treatment options and is in agreement with the current care plan. All questions were answered. The patient knows to call the clinic with any problems, questions or concerns. We can certainly see the patient much sooner if necessary.  Disclaimer: This note was dictated with voice recognition software. Similar sounding words can inadvertently be transcribed and may not be corrected upon review.

## 2017-05-21 NOTE — Telephone Encounter (Signed)
Gave patient avs and calendar with appts per 1/21 los.  °

## 2017-05-21 NOTE — Patient Instructions (Addendum)
Dehydration, Adult Dehydration is when there is not enough fluid or water in your body. This happens when you lose more fluids than you take in. Dehydration can range from mild to very bad. It should be treated right away to keep it from getting very bad. Symptoms of mild dehydration may include:  Thirst.  Dry lips.  Slightly dry mouth.  Dry, warm skin.  Dizziness. Symptoms of moderate dehydration may include:  Very dry mouth.  Muscle cramps.  Dark pee (urine). Pee may be the color of tea.  Your body making less pee.  Your eyes making fewer tears.  Heartbeat that is uneven or faster than normal (palpitations).  Headache.  Light-headedness, especially when you stand up from sitting.  Fainting (syncope). Symptoms of very bad dehydration may include:  Changes in skin, such as: ? Cold and clammy skin. ? Blotchy (mottled) or pale skin. ? Skin that does not quickly return to normal after being lightly pinched and let go (poor skin turgor).  Changes in body fluids, such as: ? Feeling very thirsty. ? Your eyes making fewer tears. ? Not sweating when body temperature is high, such as in hot weather. ? Your body making very little pee.  Changes in vital signs, such as: ? Weak pulse. ? Pulse that is more than 100 beats a minute when you are sitting still. ? Fast breathing. ? Low blood pressure.  Other changes, such as: ? Sunken eyes. ? Cold hands and feet. ? Confusion. ? Lack of energy (lethargy). ? Trouble waking up from sleep. ? Short-term weight loss. ? Unconsciousness. Follow these instructions at home:  If told by your doctor, drink an ORS: ? Make an ORS by using instructions on the package. ? Start by drinking small amounts, about  cup (120 mL) every 5-10 minutes. ? Slowly drink more until you have had the amount that your doctor said to have.  Drink enough clear fluid to keep your pee clear or pale yellow. If you were told to drink an ORS, finish the ORS  first, then start slowly drinking clear fluids. Drink fluids such as: ? Water. Do not drink only water by itself. Doing that can make the salt (sodium) level in your body get too low (hyponatremia). ? Ice chips. ? Fruit juice that you have added water to (diluted). ? Low-calorie sports drinks.  Avoid: ? Alcohol. ? Drinks that have a lot of sugar. These include high-calorie sports drinks, fruit juice that does not have water added, and soda. ? Caffeine. ? Foods that are greasy or have a lot of fat or sugar.  Take over-the-counter and prescription medicines only as told by your doctor.  Do not take salt tablets. Doing that can make the salt level in your body get too high (hypernatremia).  Eat foods that have minerals (electrolytes). Examples include bananas, oranges, potatoes, tomatoes, and spinach.  Keep all follow-up visits as told by your doctor. This is important. Contact a doctor if:  You have belly (abdominal) pain that: ? Gets worse. ? Stays in one area (localizes).  You have a rash.  You have a stiff neck.  You get angry or annoyed more easily than normal (irritability).  You are more sleepy than normal.  You have a harder time waking up than normal.  You feel: ? Weak. ? Dizzy. ? Very thirsty.  You have peed (urinated) only a small amount of very dark pee during 6-8 hours. Get help right away if:  You have symptoms of   very bad dehydration.  You cannot drink fluids without throwing up (vomiting).  Your symptoms get worse with treatment.  You have a fever.  You have a very bad headache.  You are throwing up or having watery poop (diarrhea) and it: ? Gets worse. ? Does not go away.  You have blood or something green (bile) in your throw-up.  You have blood in your poop (stool). This may cause poop to look black and tarry.  You have not peed in 6-8 hours.  You pass out (faint).  Your heart rate when you are sitting still is more than 100 beats a  minute.  You have trouble breathing. This information is not intended to replace advice given to you by your health care provider. Make sure you discuss any questions you have with your health care provider. Document Released: 02/11/2009 Document Revised: 11/05/2015 Document Reviewed: 06/11/2015 Elsevier Interactive Patient Education  2018 [employer].  Dehydration, Adult Dehydration is a condition in which there is not enough fluid or water in the body. This happens when you lose more fluids than you take in. Important organs, such as the kidneys, brain, and heart, cannot function without a proper amount of fluids. Any loss of fluids from the body can lead to dehydration. Dehydration can range from mild to severe. This condition should be treated right away to prevent it from becoming severe. What are the causes? This condition may be caused by:  Vomiting.  Diarrhea.  Excessive sweating, such as from heat exposure or exercise.  Not drinking enough fluid, especially: ? When ill. ? While doing activity that requires a lot of energy.  Excessive urination.  Fever.  Infection.  Certain medicines, such as medicines that cause the body to lose excess fluid (diuretics).  Inability to access safe drinking water.  Reduced physical ability to get adequate water and food.  What increases the risk? This condition is more likely to develop in people:  Who have a poorly controlled long-term (chronic) illness, such as diabetes, heart disease, or kidney disease.  Who are age 95 or older.  Who are disabled.  Who live in a place with high altitude.  Who play endurance sports.  What are the signs or symptoms? Symptoms of mild dehydration may include:  Thirst.  Dry lips.  Slightly dry mouth.  Dry, warm skin.  Dizziness. Symptoms of moderate dehydration may include:  Very dry mouth.  Muscle cramps.  Dark urine. Urine may be the color of tea.  Decreased urine  production.  Decreased tear production.  Heartbeat that is irregular or faster than normal (palpitations).  Headache.  Light-headedness, especially when you stand up from a sitting position.  Fainting (syncope). Symptoms of severe dehydration may include:  Changes in skin, such as: ? Cold and clammy skin. ? Blotchy (mottled) or pale skin. ? Skin that does not quickly return to normal after being lightly pinched and released (poor skin turgor).  Changes in body fluids, such as: ? Extreme thirst. ? No tear production. ? Inability to sweat when body temperature is high, such as in hot weather. ? Very little urine production.  Changes in vital signs, such as: ? Weak pulse. ? Pulse that is more than 100 beats a minute when sitting still. ? Rapid breathing. ? Low blood pressure.  Other changes, such as: ? Sunken eyes. ? Cold hands and feet. ? Confusion. ? Lack of energy (lethargy). ? Difficulty waking up from sleep. ? Short-term weight loss. ? Unconsciousness. How is  this diagnosed? This condition is diagnosed based on your symptoms and a physical exam. Blood and urine tests may be done to help confirm the diagnosis. How is this treated? Treatment for this condition depends on the severity. Mild or moderate dehydration can often be treated at home. Treatment should be started right away. Do not wait until dehydration becomes severe. Severe dehydration is an emergency and it needs to be treated in a hospital. Treatment for mild dehydration may include:  Drinking more fluids.  Replacing salts and minerals in your blood (electrolytes) that you may have lost. Treatment for moderate dehydration may include:  Drinking an oral rehydration solution (ORS). This is a drink that helps you replace fluids and electrolytes (rehydrate). It can be found at pharmacies and retail stores. Treatment for severe dehydration may include:  Receiving fluids through an IV tube.  Receiving an  electrolyte solution through a feeding tube that is passed through your nose and into your stomach (nasogastric tube, or NG tube).  Correcting any abnormalities in electrolytes.  Treating the underlying cause of dehydration. Follow these instructions at home:  If directed by your health care provider, drink an ORS: ? Make an ORS by following instructions on the package. ? Start by drinking small amounts, about  cup (120 mL) every 5-10 minutes. ? Slowly increase how much you drink until you have taken the amount recommended by your health care provider.  Drink enough clear fluid to keep your urine clear or pale yellow. If you were told to drink an ORS, finish the ORS first, then start slowly drinking other clear fluids. Drink fluids such as: ? Water. Do not drink only water. Doing that can lead to having too little salt (sodium) in the body (hyponatremia). ? Ice chips. ? Fruit juice that you have added water to (diluted fruit juice). ? Low-calorie sports drinks.  Avoid: ? Alcohol. ? Drinks that contain a lot of sugar. These include high-calorie sports drinks, fruit juice that is not diluted, and soda. ? Caffeine. ? Foods that are greasy or contain a lot of fat or sugar.  Take over-the-counter and prescription medicines only as told by your health care provider.  Do not take sodium tablets. This can lead to having too much sodium in the body (hypernatremia).  Eat foods that contain a healthy balance of electrolytes, such as bananas, oranges, potatoes, tomatoes, and spinach.  Keep all follow-up visits as told by your health care provider. This is important. Contact a health care provider if:  You have abdominal pain that: ? Gets worse. ? Stays in one area (localizes).  You have a rash.  You have a stiff neck.  You are more irritable than usual.  You are sleepier or more difficult to wake up than usual.  You feel weak or dizzy.  You feel very thirsty.  You have urinated  only a small amount of very dark urine over 6-8 hours. Get help right away if:  You have symptoms of severe dehydration.  You cannot drink fluids without vomiting.  Your symptoms get worse with treatment.  You have a fever.  You have a severe headache.  You have vomiting or diarrhea that: ? Gets worse. ? Does not go away.  You have blood or green matter (bile) in your vomit.  You have blood in your stool. This may cause stool to look black and tarry.  You have not urinated in 6-8 hours.  You faint.  Your heart rate while sitting still is  over 100 beats a minute.  You have trouble breathing. This information is not intended to replace advice given to you by your health care provider. Make sure you discuss any questions you have with your health care provider. Document Released: 04/17/2005 Document Revised: 11/12/2015 Document Reviewed: 06/11/2015 Elsevier Interactive Patient Education  [employer].

## 2017-05-22 ENCOUNTER — Other Ambulatory Visit: Payer: Self-pay | Admitting: Internal Medicine

## 2017-05-22 DIAGNOSIS — Z5111 Encounter for antineoplastic chemotherapy: Secondary | ICD-10-CM

## 2017-05-22 DIAGNOSIS — C3491 Malignant neoplasm of unspecified part of right bronchus or lung: Secondary | ICD-10-CM

## 2017-05-23 ENCOUNTER — Other Ambulatory Visit: Payer: Self-pay | Admitting: Medical Oncology

## 2017-05-23 DIAGNOSIS — F419 Anxiety disorder, unspecified: Secondary | ICD-10-CM

## 2017-05-23 DIAGNOSIS — Z5111 Encounter for antineoplastic chemotherapy: Secondary | ICD-10-CM

## 2017-05-23 DIAGNOSIS — C3491 Malignant neoplasm of unspecified part of right bronchus or lung: Secondary | ICD-10-CM

## 2017-05-23 MED ORDER — LORAZEPAM 0.5 MG PO TABS: 0.5000 mg | ORAL_TABLET | Freq: Two times a day (BID) | ORAL | 0 refills | Status: DC | PRN

## 2017-05-23 MED ORDER — FOLIC ACID 1 MG PO TABS: 1.0000 mg | ORAL_TABLET | Freq: Every day | ORAL | 0 refills | Status: DC

## 2017-05-28 ENCOUNTER — Telehealth: Payer: Self-pay | Admitting: Medical Oncology

## 2017-05-28 NOTE — Telephone Encounter (Signed)
OK for Lucianne Lei to see him. He may need IVF

## 2017-05-28 NOTE — Telephone Encounter (Signed)
appts made for tomorrow.

## 2017-05-28 NOTE — Telephone Encounter (Signed)
Pt very weak, no appetite, sore mouth. He was dizzy and light headed today after trying to stand up and had to sit back down. Next appt feb 11th.

## 2017-05-29 ENCOUNTER — Inpatient Hospital Stay: Payer: Medicare Other

## 2017-05-29 ENCOUNTER — Inpatient Hospital Stay (HOSPITAL_BASED_OUTPATIENT_CLINIC_OR_DEPARTMENT_OTHER): Payer: Medicare Other | Admitting: Medical

## 2017-05-29 VITALS — BP 117/96 | HR 114 | Temp 97.6°F | Resp 18 | Ht 73.0 in | Wt 164.3 lb

## 2017-05-29 DIAGNOSIS — Z95828 Presence of other vascular implants and grafts: Secondary | ICD-10-CM

## 2017-05-29 DIAGNOSIS — C3401 Malignant neoplasm of right main bronchus: Secondary | ICD-10-CM | POA: Diagnosis not present

## 2017-05-29 DIAGNOSIS — R062 Wheezing: Secondary | ICD-10-CM

## 2017-05-29 DIAGNOSIS — J449 Chronic obstructive pulmonary disease, unspecified: Secondary | ICD-10-CM | POA: Diagnosis not present

## 2017-05-29 DIAGNOSIS — C3491 Malignant neoplasm of unspecified part of right bronchus or lung: Secondary | ICD-10-CM

## 2017-05-29 DIAGNOSIS — Z5111 Encounter for antineoplastic chemotherapy: Secondary | ICD-10-CM

## 2017-05-29 DIAGNOSIS — R000 Tachycardia, unspecified: Secondary | ICD-10-CM | POA: Diagnosis not present

## 2017-05-29 DIAGNOSIS — R5381 Other malaise: Secondary | ICD-10-CM

## 2017-05-29 LAB — CBC WITH DIFFERENTIAL/PLATELET
Basophils Absolute: 0.1 10*3/uL (ref 0.0–0.1)
Basophils Relative: 1 %
EOS PCT: 1 %
Eosinophils Absolute: 0.1 10*3/uL (ref 0.0–0.5)
HEMATOCRIT: 29.9 % — AB (ref 38.4–49.9)
Hemoglobin: 10.2 g/dL — ABNORMAL LOW (ref 13.0–17.1)
LYMPHS ABS: 0.9 10*3/uL (ref 0.9–3.3)
LYMPHS PCT: 11 %
MCH: 33.5 pg — AB (ref 27.2–33.4)
MCHC: 33.9 g/dL (ref 32.0–36.0)
MCV: 98.7 fL — AB (ref 79.3–98.0)
MONO ABS: 1.3 10*3/uL — AB (ref 0.1–0.9)
MONOS PCT: 16 %
Neutro Abs: 5.7 10*3/uL (ref 1.5–6.5)
Neutrophils Relative %: 71 %
PLATELETS: 138 10*3/uL — AB (ref 140–400)
RBC: 3.03 MIL/uL — ABNORMAL LOW (ref 4.20–5.82)
RDW: 20.4 % — AB (ref 11.0–15.6)
WBC: 8 10*3/uL (ref 4.0–10.3)

## 2017-05-29 LAB — COMPREHENSIVE METABOLIC PANEL
ALT: 16 U/L (ref 0–55)
AST: 29 U/L (ref 5–34)
Albumin: 3.2 g/dL — ABNORMAL LOW (ref 3.5–5.0)
Alkaline Phosphatase: 83 U/L (ref 40–150)
Anion gap: 13 — ABNORMAL HIGH (ref 3–11)
BILIRUBIN TOTAL: 0.5 mg/dL (ref 0.2–1.2)
BUN: 21 mg/dL (ref 7–26)
CHLORIDE: 101 mmol/L (ref 98–109)
CO2: 22 mmol/L (ref 22–29)
CREATININE: 1.15 mg/dL (ref 0.70–1.30)
Calcium: 7.8 mg/dL — ABNORMAL LOW (ref 8.4–10.4)
GFR, EST NON AFRICAN AMERICAN: 60 mL/min — AB (ref >60)
Glucose, Bld: 201 mg/dL — ABNORMAL HIGH (ref 70–140)
POTASSIUM: 4.7 mmol/L (ref 3.5–5.1)
Sodium: 136 mmol/L (ref 136–145)
TOTAL PROTEIN: 7.7 g/dL (ref 6.4–8.3)

## 2017-05-29 MED ORDER — FLUTICASONE-SALMETEROL 115-21 MCG/ACT IN AERO: 2.0000 | INHALATION_SPRAY | Freq: Two times a day (BID) | RESPIRATORY_TRACT | 12 refills | Status: AC

## 2017-05-29 MED ORDER — HEPARIN SOD (PORK) LOCK FLUSH 100 UNIT/ML IV SOLN
500.0000 [IU] | Freq: Once | INTRAVENOUS | Status: DC
  Administered 2017-05-29: 500 [IU]
  Filled 2017-05-29: qty 5

## 2017-05-29 MED ORDER — SODIUM CHLORIDE 0.9% FLUSH
10.0000 mL | Freq: Once | INTRAVENOUS | Status: AC
  Administered 2017-05-29: 10 mL
  Filled 2017-05-29: qty 10

## 2017-05-29 MED ORDER — ALBUTEROL SULFATE (2.5 MG/3ML) 0.083% IN NEBU
INHALATION_SOLUTION | RESPIRATORY_TRACT | Status: AC
  Filled 2017-05-29: qty 3

## 2017-05-29 MED ORDER — PREDNISONE 5 MG PO TABS: ORAL_TABLET | ORAL | 0 refills | Status: DC

## 2017-05-29 MED ORDER — ALBUTEROL SULFATE (2.5 MG/3ML) 0.083% IN NEBU
2.5000 mg | INHALATION_SOLUTION | Freq: Once | RESPIRATORY_TRACT | Status: AC
  Administered 2017-05-29: 2.5 mg via RESPIRATORY_TRACT
  Filled 2017-05-29: qty 3

## 2017-05-29 NOTE — Progress Notes (Signed)
Ambulated pt around back half of [facility] to check 02 sats. Lowest was 94% with heart rate in the 119-120 during entire ambulation time.  @ 1145 pt received albuterol neb. Given instructions on how to do. Pt doing well with treatment and stated he felt better after the treatment was done.

## 2017-05-30 NOTE — Progress Notes (Signed)
[facility] Progress Note   Mark Howell [id number] June 09, 1940 77 y.o.  Mark Howell is managed by Dr. Eilleen Howell  Actively treated with chemotherapy: no  Current Therapy: Observation. Plans are to begin maintenance Alimta on 06/11/2017.  This will be dose every 3 weeks.  Assessment: Plan:    Chronic obstructive pulmonary disease, unspecified COPD type ([place]) - Plan: fluticasone-salmeterol (ADVAIR HFA) 115-21 MCG/ACT inhaler, predniSONE (DELTASONE) 5 MG tablet  Wheezing - Plan: albuterol (PROVENTIL) (2.5 MG/3ML) 0.083% nebulizer solution 2.5 mg  Physical deconditioning - Plan: Home Health, Face-to-face encounter (required for Medicare/Medicaid patients)  Port-A-Cath in place - Plan: sodium chloride flush (NS) 0.9 % injection 10 mL  Adenocarcinoma of right lung, stage 4 (HCC) - Plan: sodium chloride flush (NS) 0.9 % injection 10 mL   Chronic obstructive pulmonary disease with wheezing: Mark Howell was given an albuterol nebulizer treatment.  He was given a prescription for an Advair inhaler.  He was instructed to continue using his albuterol inhaler as needed.  The patient was given a prednisone taper.  He will revert back to his prednisone at 5 mg p.o. twice daily when his taper is completed.  He is on this dose of prednisone for his arthritis.  Physical deconditioning: A referral for home physical therapy was made.  Stage IV adenocarcinoma of the right lung: Mark Howell continues to be managed with observation at this time.  He will follow-up with Dr. Julien Howell on 06/11/2017.  Plans are to begin maintenance Alimta patient's return on 06/11/2017.  This will be dosed every 3 weeks.  Please see After Visit Summary for patient specific instructions.  Future Appointments  Date Time Provider [place]  06/11/2017 10:15 AM [facility] LAB 1 [facility] None  06/11/2017 10:30 AM [facility] PROCEDURE 1 [facility] None  06/11/2017 11:00 AM Mark Howell, Mark Awkward,  NP [facility] None  06/11/2017  1:15 PM [facility] G23 [facility] None    Orders Placed This Encounter  Procedures  . Home Health  . Face-to-face encounter (required for Medicare/Medicaid patients)       Subjective:   Patient ID:  Mark Howell is a 77 y.o. (DOB [birth year]) male.  Chief Complaint:  Chief Complaint  Patient presents with  . Fatigue    HPI Mark Howell is a 77 year old male with small cell lung cancer, adenocarcinoma which was initially diagnosed as an unresectable stage II a (T1a, N1, M0) non-small cell lung cancer, adenocarcinoma with a right hilar mass and right hilar adenopathy in September 2017. He was treated with concurrent chemoradiation with weekly carboplatin and paclitaxel for 7 weeks. This was followed by systemic chemotherapy with carboplatin, Alimta, and Avastin.  Mr. Mark Howell was last seen by Dr. Julien Howell on 1/21/2019after having restaging CT scans completed which showed stable disease.  He is scheduled to follow-up with Dr. Julien Howell on 05/21/2017 for start of maintenance Alimta.  He presents to the office today with a report of progressive weakness, fatigue, anorexia, shortness of breath with activity, and a cough with whitish to yellow sputum.  He has an albuterol inhaler which he uses at home.  He states that he has been getting short of breath simply when getting up to walk across his bedroom to go to the bathroom.  His oxygen saturation at rest on room air today is at 100% with his oxygen saturation only dropping to 94% on room air with activity despite his shortness of breath.  He continues on prednisone 5 mg p.o. twice daily for  his arthritis.  He denies fevers, chills, or sweats.  Medications: I have reviewed the patient's current medications.  Allergies:  Allergies  Allergen Reactions  . Hydrocodone Rash  . Oxycodone Rash    Past Medical History:  Diagnosis Date  . Arthritis   . Complication of anesthesia    problems voiding post op  .  Full dentures   . GERD (gastroesophageal reflux disease)   . History of radiation therapy 01/25/16-02/28/16   right lung 45 Gy  . History of radiation therapy 04/17/16-05/02/16   right lung boost 20 Gy in 10 fractions cumulative dose 65 gray  . HOH (hard of hearing)   . HTN (hypertension)   . Hyperlipidemia   . Lung mass 12/30/2015  . Odynophagia 04/06/2016  . Psoriatic arthritis ([place])   . Snores   . Wears glasses     Past Surgical History:  Procedure Laterality Date  . COLONOSCOPY    . HEMORRHOID SURGERY N/A 10/01/2013   Procedure: EXAM UNDER ANESTHESIA  AND EXCISIONAL HEMORRHOIDECTOMY WITH HEMORRHOID BANDING X 2;  Surgeon: Gayland Curry, MD;  Location: [place] [facility];  Service: General;  Laterality: N/A;  . INGUINAL HERNIA REPAIR  01/04/2012   Procedure: LAPAROSCOPIC INGUINAL HERNIA;  Surgeon: Gayland Curry, MD,FACS;  Location: [facility];  Service: General;  Laterality: Right;  . IR FLUORO GUIDE PORT INSERTION RIGHT  01/02/2017  . IR US GUIDE VASC ACCESS RIGHT  01/02/2017  . VIDEO BRONCHOSCOPY WITH ENDOBRONCHIAL ULTRASOUND N/A 12/31/2015   Procedure: VIDEO BRONCHOSCOPY WITH ENDOBRONCHIAL ULTRASOUND transbronchial biopsy of node 10 R lymph node;  Surgeon: Grace Isaac, MD;  Location: [facility] OR;  Service: Thoracic;  Laterality: N/A;    Family History  Problem Relation Age of Onset  . Stroke Father   . Diabetes Father   . Heart disease Father   . Cancer Brother        pancreatic    Social History   Socioeconomic History  . Marital status: Married    Spouse name: Not on file  . Number of children: Not on file  . Years of education: Not on file  . Highest education level: Not on file  Social Needs  . Financial resource strain: Not on file  . Food insecurity - worry: Not on file  . Food insecurity - inability: Not on file  . Transportation needs - medical: Not on file  . Transportation needs - non-medical: Not on file  Occupational History  . Not on file  Tobacco Use  .  Smoking status: Former Smoker    Packs/day: 0.50    Years: 50.00    Pack years: 25.00    Types: Cigarettes    Last attempt to quit: 11/30/2015    Years since quitting: 1.4  . Smokeless tobacco: Never Used  Substance and Sexual Activity  . Alcohol use: Yes    Comment: occassionally  . Drug use: No  . Sexual activity: Not Currently  Other Topics Concern  . Not on file  Social History Narrative  . Not on file    Past Medical History, Surgical history, Social history, and Family history were reviewed and updated as appropriate.   Please see review of systems for further details on the patient's review from today.   Review of Systems:  Review of Systems  Constitutional: Positive for appetite change and fatigue. Negative for chills, diaphoresis and unexpected weight change.  HENT: Negative for congestion.   Respiratory: Positive for cough, shortness of breath and  wheezing. Negative for choking and chest tightness.   Cardiovascular: Negative for chest pain and palpitations.  Gastrointestinal: Positive for diarrhea (Mild daily diarrhea). Negative for constipation, nausea and vomiting.  Neurological: Positive for weakness.    Objective:   Physical Exam:  BP (!) 117/96 (BP Location: Right Arm, Patient Position: Sitting) Comment: The nurse Beth is aware of the bp.  Pulse (!) 114 Comment: The nurse Beth is aware of the heart rate  Temp 97.6 F (36.4 C) (Oral)   Resp 18   Ht 6\' 1"  (1.854 m)   Wt 164 lb 4.8 oz (74.5 kg)   SpO2 100%   BMI 21.68 kg/m  ECOG: 1  Physical Exam  HENT:  Head: Normocephalic and atraumatic.  Mouth/Throat: Oropharynx is clear and moist. No oropharyngeal exudate.  Cardiovascular: S1 normal and S2 normal. Tachycardia present.  Pulmonary/Chest: Effort normal. No respiratory distress. He has wheezes (Diffuse inspiratory wheezing throughout the right lung.). He has no rales.  Neurological: He is alert. Coordination (Patient's gait is slightly  unstable.)  abnormal.  Skin: Skin is warm and dry. He is not diaphoretic.  Psychiatric: He has a normal mood and affect. His behavior is normal. Judgment and thought content normal.   Pre-albuterol exam: See above physical exam  Post-albuterol exam: Wheezing within the right lung field cleared after an albuterol nebulizer treatment.  Lab Review:     Component Value Date/Time   NA 136 05/29/2017 1019   NA 135 (L) 04/30/2017 0824   K 4.7 05/29/2017 1019   K 4.4 04/30/2017 0824   CL 101 05/29/2017 1019   CO2 22 05/29/2017 1019   CO2 19 (L) 04/30/2017 0824   GLUCOSE 201 (H) 05/29/2017 1019   GLUCOSE 277 (H) 04/30/2017 0824   BUN 21 05/29/2017 1019   BUN 28.0 (H) 04/30/2017 0824   CREATININE 1.15 05/29/2017 1019   CREATININE 1.5 (H) 04/30/2017 0824   CALCIUM 7.8 (L) 05/29/2017 1019   CALCIUM 7.6 (L) 04/30/2017 0824   PROT 7.7 05/29/2017 1019   PROT 7.7 04/30/2017 0824   ALBUMIN 3.2 (L) 05/29/2017 1019   ALBUMIN 3.7 04/30/2017 0824   AST 29 05/29/2017 1019   AST 55 (H) 05/07/2017 1200   AST 22 04/30/2017 0824   ALT 16 05/29/2017 1019   ALT 52 05/07/2017 1200   ALT 19 04/30/2017 0824   ALKPHOS 83 05/29/2017 1019   ALKPHOS 88 04/30/2017 0824   BILITOT 0.5 05/29/2017 1019   BILITOT 0.7 05/07/2017 1200   BILITOT 0.57 04/30/2017 0824   GFRNONAA 60 (L) 05/29/2017 1019   GFRNONAA 55 (L) 05/07/2017 1200   GFRAA >60 05/29/2017 1019   GFRAA >60 05/07/2017 1200       Component Value Date/Time   WBC 8.0 05/29/2017 1019   RBC 3.03 (L) 05/29/2017 1019   HGB 10.2 (L) 05/29/2017 1019   HGB 10.9 (L) 04/30/2017 0824   HCT 29.9 (L) 05/29/2017 1019   HCT 32.7 (L) 04/30/2017 0824   PLT 138 (L) 05/29/2017 1019   PLT 76 (L) 05/07/2017 1200   PLT 118 (L) 04/30/2017 0824   MCV 98.7 (H) 05/29/2017 1019   MCV 101.9 (H) 04/30/2017 0824   MCH 33.5 (H) 05/29/2017 1019   MCHC 33.9 05/29/2017 1019   RDW 20.4 (H) 05/29/2017 1019   RDW 19.3 (H) 04/30/2017 0824   LYMPHSABS 0.9 05/29/2017 1019   LYMPHSABS  1.3 04/30/2017 0824   MONOABS 1.3 (H) 05/29/2017 1019   MONOABS 1.2 (H) 04/30/2017 0824   EOSABS  0.1 05/29/2017 1019   EOSABS 0.0 04/30/2017 0824   BASOSABS 0.1 05/29/2017 1019   BASOSABS 0.0 04/30/2017 0824   -------------------------------  Imaging from last 24 hours (if applicable):  Radiology interpretation: Dg Chest 2 View  Result Date: 05/09/2017 CLINICAL DATA:  Cough and fever.  Lung cancer. EXAM: CHEST  2 VIEW COMPARISON:  05/07/2017 FINDINGS: Progressive enlargement of the right hilum. Given the time frame, this is most likely pneumonia or radiation pneumonitis rather than tumor progression. Left lung remains clear. No heart failure or effusion. Port-A-Cath tip in the lower SVC unchanged. IMPRESSION: Progressive enlargement of the right hilum which may be due to pneumonia or radiation change. Electronically Signed   By: Franchot Gallo M.D.   On: 05/09/2017 15:35   Dg Chest 2 View  Result Date: 05/07/2017 CLINICAL DATA:  Cough, wheezing and shortness of breath. Currently on chemotherapy for lung malignancy. EXAM: CHEST  2 VIEW COMPARISON:  Chest x-ray of March 05, 2016 and chest CT scan of March 08, 2017 FINDINGS: A right hilar soft tissue mass persists. There is new patchy infiltrate in the left lower lobe. The heart and pulmonary vascularity are normal. The power port catheter tip projects over the junction of the middle and distal thirds of the SVC. IMPRESSION: Left lower lobe pneumonia. Fairly stable right hilar mass. Followup PA and lateral chest X-ray is recommended in 3-4 weeks following trial of antibiotic therapy to ensure resolution and exclude underlying malignancy. Electronically Signed   By: David  Martinique M.D.   On: 05/07/2017 16:43   Ct Angio Chest Pe W And/or Wo Contrast  Result Date: 05/09/2017 CLINICAL DATA:  77 year old male with history of metastatic non-small cell lung cancer on chemotherapy presenting with history of fever, weakness and shortness of breath for the  past 4 weeks. EXAM: CT ANGIOGRAPHY CHEST WITH CONTRAST TECHNIQUE: Multidetector CT imaging of the chest was performed using the standard protocol during bolus administration of intravenous contrast. Multiplanar CT image reconstructions and MIPs were obtained to evaluate the vascular anatomy. CONTRAST:  135mL ISOVUE-370 IOPAMIDOL (ISOVUE-370) INJECTION 76% COMPARISON:  Chest CT 03/08/2017. FINDINGS: Cardiovascular: Today's study is limited by considerable patient respiratory motion. With this limitation in mind, there is no central, lobar or proximal segmental sized pulmonary embolism. More distal segmental or subsegmental sized emboli cannot be entirely excluded secondary to respiratory motion. Heart size is normal. There is no significant pericardial fluid, thickening or pericardial calcification. There is aortic atherosclerosis, as well as atherosclerosis of the great vessels of the mediastinum and the coronary arteries, including calcified atherosclerotic plaque in the left anterior descending coronary artery. Calcification of the aortic valve. Right internal jugular single-lumen porta cath with tip terminating at the superior cavoatrial junction. Mediastinum/Nodes: Extensive soft tissue thickening in the right hilar region likely related to prior radiation therapy and similar to the prior examination. No pathologically enlarged mediastinal or left hilar lymph nodes. Esophagus is unremarkable in appearance. No axillary lymphadenopathy. Lungs/Pleura: Increasing mass-like architectural distortion throughout the medial aspect of the right lung emanating outward from the hilar region, likely to reflect progressive postradiation mass-like fibrosis. No definite suspicious appearing pulmonary nodule or mass to strongly suggest recurrent or metastatic disease in the lungs. Several nodular areas of architectural distortion in the periphery of the left lung appear stable compared to prior examinations, most compatible with  areas of post infectious or inflammatory scarring. No acute consolidative airspace disease. No pleural effusions. Diffuse bronchial wall thickening with mild centrilobular and paraseptal emphysema. Upper Abdomen: Aortic  atherosclerosis. 1.8 cm right adrenal nodule is unchanged. Left adrenal mass has clearly enlarged, currently measuring 3.3 cm in diameter. Musculoskeletal: There are no aggressive appearing lytic or blastic lesions noted in the visualized portions of the skeleton. Review of the MIP images confirms the above findings. IMPRESSION: 1. Although today's study is limited by considerable patient respiratory motion there is no evidence of central, lobar or proximal segmental sized pulmonary embolism. 2. Evolving chronic postradiation changes of mass-like fibrosis throughout the medial aspect of the right lung. 3. 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 4. Aortic atherosclerosis, in addition to left anterior descending coronary artery disease. 5. There are calcifications of the aortic valve and mitral annulus. Echocardiographic correlation for evaluation of potential valvular dysfunction may be warranted if clinically indicated. 6. Mild diffuse bronchial thickening with mild centrilobular and paraseptal emphysema; imaging findings suggestive of underlying COPD. Aortic Atherosclerosis (ICD10-I70.0) and Emphysema (ICD10-J43.9). Electronically Signed   By: Vinnie Langton M.D.   On: 05/09/2017 18:57   Ct Abdomen Pelvis W Contrast  Result Date: 05/17/2017 CLINICAL DATA:  77 year old male with history of lung cancer diagnosed in 2017 status post chemotherapy and radiation therapy now completed. Restaging examination. EXAM: CT ABDOMEN AND PELVIS WITH CONTRAST TECHNIQUE: Multidetector CT imaging of the abdomen and pelvis was performed using the standard protocol following bolus administration of intravenous contrast. CONTRAST:  179mL ISOVUE-300 IOPAMIDOL  (ISOVUE-300) INJECTION 61% COMPARISON:  CT of the abdomen and pelvis 03/08/2017. FINDINGS: Lower chest: Atherosclerotic calcifications in the left anterior descending and right coronary arteries. Calcifications of the aortic valve. Postradiation changes in the medial aspect of the right lung, similar to recent chest CT. Hepatobiliary: No cystic or solid hepatic lesions. No intra or extrahepatic biliary ductal dilatation. Gallbladder is normal in appearance. Pancreas: No pancreatic mass. No pancreatic ductal dilatation. No pancreatic or peripancreatic fluid or inflammatory changes. Spleen: Unremarkable. 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 Multiple nonobstructive calculi are noted within the collecting systems of both kidneys, measuring up to 6 mm in the interpolar region of the right kidney. Multifocal cortical thinning, compatible with chronic scarring in both kidneys. 13 mm simple cyst in the lower pole of the left kidney. No hydroureteronephrosis. Urinary bladder is partially decompressed, but is otherwise unremarkable in appearance. Stomach/Bowel: Normal appearance of the stomach. No pathologic dilatation of small bowel or colon. Numerous colonic diverticulae are noted, without surrounding inflammatory changes to suggest an acute diverticulitis at this time. Normal appendix. 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 No lymphadenopathy noted in the abdomen or pelvis. Reproductive: Prostate gland is enlarged and heterogeneous in appearance with median lobe hypertrophy measuring 3.9 x 5.3 x 5.5 cm. Seminal vesicles are unremarkable in appearance. Other: No significant volume of ascites.  No pneumoperitoneum. Musculoskeletal: There are no aggressive appearing lytic or blastic lesions noted in the visualized  portions of the skeleton. IMPRESSION: 1. Bilateral adrenal metastases redemonstrated, as above. 2. No new signs of metastatic disease elsewhere in the abdomen or pelvis. 3. Aortic atherosclerosis with coronary artery disease and aneurysmal dilatation of the common iliac arteries measuring up to 17 mm in diameter bilaterally. 4. Colonic diverticulosis without evidence of acute diverticulitis at this time. 5. Prostatomegaly with median lobe hypertrophy of the prostate gland and mild thickening of the urinary bladder, which  could suggest chronic bladder outlet obstruction. 6. There are calcifications of the aortic valve. Echocardiographic correlation for evaluation of potential valvular dysfunction may be warranted if clinically indicated. 7. Additional incidental findings, as above. Aortic Atherosclerosis (ICD10-I70.0). Electronically Signed   By: Vinnie Langton M.D.   On: 05/17/2017 15:10

## 2017-05-31 ENCOUNTER — Telehealth: Payer: Self-pay | Admitting: Medical

## 2017-05-31 NOTE — Telephone Encounter (Signed)
No 1/29 los

## 2017-06-06 ENCOUNTER — Other Ambulatory Visit: Payer: Self-pay

## 2017-06-06 ENCOUNTER — Inpatient Hospital Stay (HOSPITAL_BASED_OUTPATIENT_CLINIC_OR_DEPARTMENT_OTHER): Payer: Medicare Other | Admitting: Medical

## 2017-06-06 ENCOUNTER — Encounter (HOSPITAL_COMMUNITY): Payer: Self-pay | Admitting: Emergency Medicine

## 2017-06-06 ENCOUNTER — Inpatient Hospital Stay (HOSPITAL_COMMUNITY)
Admission: EM | Admit: 2017-06-06 | Discharge: 2017-06-12 | DRG: 637 | Disposition: A | Discharge status: Home Health | Payer: Medicare Other | Attending: Internal Medicine | Admitting: Internal Medicine

## 2017-06-06 ENCOUNTER — Inpatient Hospital Stay: Payer: Medicare Other | Admitting: Medical

## 2017-06-06 ENCOUNTER — Telehealth: Payer: Self-pay | Admitting: Medical Oncology

## 2017-06-06 ENCOUNTER — Inpatient Hospital Stay: Payer: Medicare Other | Attending: Internal Medicine

## 2017-06-06 ENCOUNTER — Ambulatory Visit (HOSPITAL_COMMUNITY)
Admission: RE | Admit: 2017-06-06 | Discharge: 2017-06-06 | Disposition: A | Discharge status: Home / Self Care | Payer: Medicare Other | Source: Ambulatory Visit | Attending: Medical | Admitting: Medical

## 2017-06-06 VITALS — BP 132/85 | HR 132 | Temp 97.7°F | Resp 19 | Ht 73.0 in | Wt 163.3 lb

## 2017-06-06 DIAGNOSIS — D631 Anemia in chronic kidney disease: Secondary | ICD-10-CM | POA: Diagnosis present

## 2017-06-06 DIAGNOSIS — R0600 Dyspnea, unspecified: Secondary | ICD-10-CM

## 2017-06-06 DIAGNOSIS — K219 Gastro-esophageal reflux disease without esophagitis: Secondary | ICD-10-CM | POA: Diagnosis present

## 2017-06-06 DIAGNOSIS — N390 Urinary tract infection, site not specified: Secondary | ICD-10-CM | POA: Diagnosis not present

## 2017-06-06 DIAGNOSIS — C3491 Malignant neoplasm of unspecified part of right bronchus or lung: Secondary | ICD-10-CM

## 2017-06-06 DIAGNOSIS — Z7952 Long term (current) use of systemic steroids: Secondary | ICD-10-CM

## 2017-06-06 DIAGNOSIS — R631 Polydipsia: Secondary | ICD-10-CM | POA: Diagnosis not present

## 2017-06-06 DIAGNOSIS — Z5112 Encounter for antineoplastic immunotherapy: Secondary | ICD-10-CM | POA: Insufficient documentation

## 2017-06-06 DIAGNOSIS — E1110 Type 2 diabetes mellitus with ketoacidosis without coma: Secondary | ICD-10-CM | POA: Diagnosis not present

## 2017-06-06 DIAGNOSIS — Z79899 Other long term (current) drug therapy: Secondary | ICD-10-CM | POA: Insufficient documentation

## 2017-06-06 DIAGNOSIS — R000 Tachycardia, unspecified: Secondary | ICD-10-CM

## 2017-06-06 DIAGNOSIS — L4050 Arthropathic psoriasis, unspecified: Secondary | ICD-10-CM | POA: Diagnosis present

## 2017-06-06 DIAGNOSIS — C3490 Malignant neoplasm of unspecified part of unspecified bronchus or lung: Secondary | ICD-10-CM | POA: Diagnosis not present

## 2017-06-06 DIAGNOSIS — T415X5A Adverse effect of therapeutic gases, initial encounter: Secondary | ICD-10-CM | POA: Diagnosis not present

## 2017-06-06 DIAGNOSIS — Z85118 Personal history of other malignant neoplasm of bronchus and lung: Secondary | ICD-10-CM

## 2017-06-06 DIAGNOSIS — N183 Chronic kidney disease, stage 3 (moderate): Secondary | ICD-10-CM | POA: Diagnosis present

## 2017-06-06 DIAGNOSIS — I129 Hypertensive chronic kidney disease with stage 1 through stage 4 chronic kidney disease, or unspecified chronic kidney disease: Secondary | ICD-10-CM | POA: Diagnosis present

## 2017-06-06 DIAGNOSIS — C7972 Secondary malignant neoplasm of left adrenal gland: Secondary | ICD-10-CM | POA: Diagnosis not present

## 2017-06-06 DIAGNOSIS — R531 Weakness: Secondary | ICD-10-CM | POA: Diagnosis not present

## 2017-06-06 DIAGNOSIS — R040 Epistaxis: Secondary | ICD-10-CM | POA: Diagnosis not present

## 2017-06-06 DIAGNOSIS — Z95828 Presence of other vascular implants and grafts: Secondary | ICD-10-CM

## 2017-06-06 DIAGNOSIS — E785 Hyperlipidemia, unspecified: Secondary | ICD-10-CM | POA: Diagnosis present

## 2017-06-06 DIAGNOSIS — R059 Cough, unspecified: Secondary | ICD-10-CM

## 2017-06-06 DIAGNOSIS — R05 Cough: Secondary | ICD-10-CM | POA: Diagnosis not present

## 2017-06-06 DIAGNOSIS — Z923 Personal history of irradiation: Secondary | ICD-10-CM | POA: Insufficient documentation

## 2017-06-06 DIAGNOSIS — Z833 Family history of diabetes mellitus: Secondary | ICD-10-CM

## 2017-06-06 DIAGNOSIS — E872 Acidosis: Secondary | ICD-10-CM | POA: Diagnosis present

## 2017-06-06 DIAGNOSIS — J9601 Acute respiratory failure with hypoxia: Secondary | ICD-10-CM | POA: Diagnosis not present

## 2017-06-06 DIAGNOSIS — C7971 Secondary malignant neoplasm of right adrenal gland: Secondary | ICD-10-CM | POA: Diagnosis not present

## 2017-06-06 DIAGNOSIS — E1165 Type 2 diabetes mellitus with hyperglycemia: Principal | ICD-10-CM | POA: Diagnosis present

## 2017-06-06 DIAGNOSIS — Z7982 Long term (current) use of aspirin: Secondary | ICD-10-CM

## 2017-06-06 DIAGNOSIS — R739 Hyperglycemia, unspecified: Secondary | ICD-10-CM

## 2017-06-06 DIAGNOSIS — D630 Anemia in neoplastic disease: Secondary | ICD-10-CM | POA: Diagnosis present

## 2017-06-06 DIAGNOSIS — H9190 Unspecified hearing loss, unspecified ear: Secondary | ICD-10-CM | POA: Diagnosis present

## 2017-06-06 DIAGNOSIS — T451X5A Adverse effect of antineoplastic and immunosuppressive drugs, initial encounter: Secondary | ICD-10-CM | POA: Diagnosis present

## 2017-06-06 DIAGNOSIS — Z5111 Encounter for antineoplastic chemotherapy: Secondary | ICD-10-CM | POA: Insufficient documentation

## 2017-06-06 DIAGNOSIS — E860 Dehydration: Secondary | ICD-10-CM | POA: Diagnosis present

## 2017-06-06 DIAGNOSIS — J439 Emphysema, unspecified: Secondary | ICD-10-CM | POA: Diagnosis present

## 2017-06-06 DIAGNOSIS — Z7951 Long term (current) use of inhaled steroids: Secondary | ICD-10-CM

## 2017-06-06 DIAGNOSIS — E119 Type 2 diabetes mellitus without complications: Secondary | ICD-10-CM | POA: Insufficient documentation

## 2017-06-06 DIAGNOSIS — D6481 Anemia due to antineoplastic chemotherapy: Secondary | ICD-10-CM | POA: Diagnosis present

## 2017-06-06 DIAGNOSIS — Z87891 Personal history of nicotine dependence: Secondary | ICD-10-CM

## 2017-06-06 DIAGNOSIS — Z8249 Family history of ischemic heart disease and other diseases of the circulatory system: Secondary | ICD-10-CM

## 2017-06-06 DIAGNOSIS — C3401 Malignant neoplasm of right main bronchus: Secondary | ICD-10-CM | POA: Diagnosis not present

## 2017-06-06 DIAGNOSIS — E1122 Type 2 diabetes mellitus with diabetic chronic kidney disease: Secondary | ICD-10-CM | POA: Diagnosis present

## 2017-06-06 DIAGNOSIS — I700 Atherosclerosis of aorta: Secondary | ICD-10-CM | POA: Insufficient documentation

## 2017-06-06 DIAGNOSIS — T380X5A Adverse effect of glucocorticoids and synthetic analogues, initial encounter: Secondary | ICD-10-CM | POA: Diagnosis present

## 2017-06-06 DIAGNOSIS — J189 Pneumonia, unspecified organism: Secondary | ICD-10-CM | POA: Diagnosis present

## 2017-06-06 DIAGNOSIS — Z885 Allergy status to narcotic agent status: Secondary | ICD-10-CM

## 2017-06-06 DIAGNOSIS — Y95 Nosocomial condition: Secondary | ICD-10-CM | POA: Diagnosis present

## 2017-06-06 DIAGNOSIS — E871 Hypo-osmolality and hyponatremia: Secondary | ICD-10-CM | POA: Diagnosis present

## 2017-06-06 DIAGNOSIS — Z800 Family history of malignant neoplasm of digestive organs: Secondary | ICD-10-CM

## 2017-06-06 DIAGNOSIS — D696 Thrombocytopenia, unspecified: Secondary | ICD-10-CM | POA: Diagnosis present

## 2017-06-06 DIAGNOSIS — N179 Acute kidney failure, unspecified: Secondary | ICD-10-CM | POA: Diagnosis present

## 2017-06-06 LAB — URINALYSIS, ROUTINE W REFLEX MICROSCOPIC
BACTERIA UA: NONE SEEN
Bilirubin Urine: NEGATIVE
Glucose, UA: NEGATIVE mg/dL
KETONES UR: NEGATIVE mg/dL
Leukocytes, UA: NEGATIVE
NITRITE: NEGATIVE
PROTEIN: 100 mg/dL — AB
Specific Gravity, Urine: 1.021 (ref 1.005–1.030)
pH: 5 (ref 5.0–8.0)

## 2017-06-06 LAB — CBC WITH DIFFERENTIAL/PLATELET
Basophils Absolute: 0.2 10*3/uL — ABNORMAL HIGH (ref 0.0–0.1)
Basophils Relative: 2 %
EOS PCT: 2 %
Eosinophils Absolute: 0.2 10*3/uL (ref 0.0–0.5)
HEMATOCRIT: 33.2 % — AB (ref 38.4–49.9)
Hemoglobin: 11.1 g/dL — ABNORMAL LOW (ref 13.0–17.1)
LYMPHS ABS: 1 10*3/uL (ref 0.9–3.3)
LYMPHS PCT: 9 %
MCH: 32.7 pg (ref 27.2–33.4)
MCHC: 33.4 g/dL (ref 32.0–36.0)
MCV: 97.9 fL (ref 79.3–98.0)
MONO ABS: 1.3 10*3/uL — AB (ref 0.1–0.9)
Monocytes Relative: 13 %
NEUTROS ABS: 7.6 10*3/uL — AB (ref 1.5–6.5)
Neutrophils Relative %: 74 %
Platelets: 158 10*3/uL (ref 140–400)
RBC: 3.39 MIL/uL — AB (ref 4.20–5.82)
RDW: 20.6 % — AB (ref 11.0–14.6)
Smear Review: 2
WBC: 10.2 10*3/uL (ref 4.0–10.3)

## 2017-06-06 LAB — COMPREHENSIVE METABOLIC PANEL
ALT: 17 U/L (ref 0–55)
AST: 27 U/L (ref 5–34)
Albumin: 3.2 g/dL — ABNORMAL LOW (ref 3.5–5.0)
Alkaline Phosphatase: 97 U/L (ref 40–150)
Anion gap: 15 — ABNORMAL HIGH (ref 3–11)
BILIRUBIN TOTAL: 0.7 mg/dL (ref 0.2–1.2)
BUN: 31 mg/dL — AB (ref 7–26)
CO2: 19 mmol/L — ABNORMAL LOW (ref 22–29)
CREATININE: 1.36 mg/dL — AB (ref 0.70–1.30)
Calcium: 9 mg/dL (ref 8.4–10.4)
Chloride: 96 mmol/L — ABNORMAL LOW (ref 98–109)
GFR calc non Af Amer: 49 mL/min — ABNORMAL LOW (ref >60)
GFR, EST AFRICAN AMERICAN: 56 mL/min — AB (ref >60)
Glucose, Bld: 212 mg/dL — ABNORMAL HIGH (ref 70–140)
Potassium: 4.3 mmol/L (ref 3.5–5.1)
Sodium: 130 mmol/L — ABNORMAL LOW (ref 136–145)
TOTAL PROTEIN: 7.8 g/dL (ref 6.4–8.3)

## 2017-06-06 LAB — URINALYSIS, COMPLETE (UACMP) WITH MICROSCOPIC
Bilirubin Urine: NEGATIVE
GLUCOSE, UA: 50 mg/dL — AB
Ketones, ur: NEGATIVE mg/dL
Leukocytes, UA: NEGATIVE
NITRITE: NEGATIVE
PH: 5 (ref 5.0–8.0)
Protein, ur: 100 mg/dL — AB
SPECIFIC GRAVITY, URINE: 1.02 (ref 1.005–1.030)

## 2017-06-06 LAB — CBC
HCT: 28.5 % — ABNORMAL LOW (ref 39.0–52.0)
HEMOGLOBIN: 9.5 g/dL — AB (ref 13.0–17.0)
MCH: 32.3 pg (ref 26.0–34.0)
MCHC: 33.3 g/dL (ref 30.0–36.0)
MCV: 96.9 fL (ref 78.0–100.0)
Platelets: 111 10*3/uL — ABNORMAL LOW (ref 150–400)
RBC: 2.94 MIL/uL — ABNORMAL LOW (ref 4.22–5.81)
RDW: 18.7 % — AB (ref 11.5–15.5)
WBC: 7.9 10*3/uL (ref 4.0–10.5)

## 2017-06-06 LAB — HEMOGLOBIN A1C
Hgb A1c MFr Bld: 7.4 % — ABNORMAL HIGH (ref 4.8–5.6)
MEAN PLASMA GLUCOSE: 165.68 mg/dL

## 2017-06-06 LAB — CBG MONITORING, ED
GLUCOSE-CAPILLARY: 166 mg/dL — AB (ref 65–99)
GLUCOSE-CAPILLARY: 219 mg/dL — AB (ref 65–99)

## 2017-06-06 LAB — CREATININE, SERUM
Creatinine, Ser: 1.2 mg/dL (ref 0.61–1.24)
GFR, EST NON AFRICAN AMERICAN: 57 mL/min — AB (ref >60)

## 2017-06-06 LAB — MRSA PCR SCREENING: MRSA by PCR: POSITIVE — AB

## 2017-06-06 LAB — I-STAT CG4 LACTIC ACID, ED
Lactic Acid, Venous: 1.44 mmol/L (ref 0.5–1.9)
Lactic Acid, Venous: 1.14 mmol/L (ref 0.5–1.9)

## 2017-06-06 MED ORDER — IPRATROPIUM BROMIDE 0.02 % IN SOLN
0.5000 mg | Freq: Two times a day (BID) | RESPIRATORY_TRACT | Status: DC
  Administered 2017-06-07 – 2017-06-12 (×9): 0.5 mg via RESPIRATORY_TRACT
  Filled 2017-06-06 (×10): qty 2.5

## 2017-06-06 MED ORDER — SODIUM BICARBONATE 650 MG PO TABS
650.0000 mg | ORAL_TABLET | Freq: Three times a day (TID) | ORAL | Status: DC
  Administered 2017-06-06 – 2017-06-12 (×17): 650 mg via ORAL
  Filled 2017-06-06 (×18): qty 1

## 2017-06-06 MED ORDER — SODIUM CHLORIDE 0.9% FLUSH
10.0000 mL | INTRAVENOUS | Status: DC | PRN
  Administered 2017-06-07 – 2017-06-12 (×2): 10 mL
  Filled 2017-06-06 (×2): qty 40

## 2017-06-06 MED ORDER — FOLIC ACID 1 MG PO TABS
1.0000 mg | ORAL_TABLET | Freq: Every day | ORAL | Status: DC
  Administered 2017-06-07 – 2017-06-12 (×5): 1 mg via ORAL
  Filled 2017-06-06 (×5): qty 1

## 2017-06-06 MED ORDER — PANTOPRAZOLE SODIUM 40 MG PO TBEC
40.0000 mg | DELAYED_RELEASE_TABLET | Freq: Every day | ORAL | Status: DC
  Administered 2017-06-07 – 2017-06-12 (×5): 40 mg via ORAL
  Filled 2017-06-06 (×5): qty 1

## 2017-06-06 MED ORDER — DIPHENHYDRAMINE HCL 25 MG PO CAPS
25.0000 mg | ORAL_CAPSULE | Freq: Every evening | ORAL | Status: DC | PRN
  Administered 2017-06-10 – 2017-06-11 (×2): 25 mg via ORAL
  Filled 2017-06-06 (×3): qty 1

## 2017-06-06 MED ORDER — INSULIN ASPART 100 UNIT/ML ~~LOC~~ SOLN
0.0000 [IU] | Freq: Three times a day (TID) | SUBCUTANEOUS | Status: DC
Start: 2017-06-07 — End: 2017-06-08
  Administered 2017-06-07 (×3): 2 [IU] via SUBCUTANEOUS
  Administered 2017-06-08: 3 [IU] via SUBCUTANEOUS
  Administered 2017-06-08 (×2): 2 [IU] via SUBCUTANEOUS

## 2017-06-06 MED ORDER — ACETAMINOPHEN 500 MG PO TABS
500.0000 mg | ORAL_TABLET | Freq: Every evening | ORAL | Status: DC | PRN
  Administered 2017-06-11: 500 mg via ORAL
  Filled 2017-06-06 (×2): qty 1

## 2017-06-06 MED ORDER — PRAVASTATIN SODIUM 40 MG PO TABS
40.0000 mg | ORAL_TABLET | Freq: Every day | ORAL | Status: DC
  Administered 2017-06-06 – 2017-06-11 (×6): 40 mg via ORAL
  Filled 2017-06-06 (×5): qty 1
  Filled 2017-06-06 (×2): qty 2

## 2017-06-06 MED ORDER — MOMETASONE FURO-FORMOTEROL FUM 200-5 MCG/ACT IN AERO
2.0000 | INHALATION_SPRAY | Freq: Two times a day (BID) | RESPIRATORY_TRACT | Status: DC
Start: 2017-06-06 — End: 2017-06-12
  Administered 2017-06-06 – 2017-06-12 (×8): 2 via RESPIRATORY_TRACT
  Filled 2017-06-06: qty 8.8

## 2017-06-06 MED ORDER — LOPERAMIDE HCL 2 MG PO CAPS: 2.0000 mg | ORAL_CAPSULE | Freq: Every day | ORAL | Status: DC | PRN

## 2017-06-06 MED ORDER — DIPHENHYDRAMINE-APAP (SLEEP) 25-500 MG PO TABS: 1.0000 | ORAL_TABLET | Freq: Every evening | ORAL | Status: DC | PRN

## 2017-06-06 MED ORDER — LEVALBUTEROL HCL 1.25 MG/0.5ML IN NEBU
1.2500 mg | INHALATION_SOLUTION | Freq: Three times a day (TID) | RESPIRATORY_TRACT | Status: DC
  Administered 2017-06-06: 1.25 mg via RESPIRATORY_TRACT
  Filled 2017-06-06: qty 0.5

## 2017-06-06 MED ORDER — METOPROLOL TARTRATE 25 MG PO TABS
12.5000 mg | ORAL_TABLET | Freq: Two times a day (BID) | ORAL | Status: DC
  Administered 2017-06-06 – 2017-06-12 (×10): 12.5 mg via ORAL
  Filled 2017-06-06 (×11): qty 1

## 2017-06-06 MED ORDER — SODIUM CHLORIDE 0.9 % IV BOLUS (SEPSIS)
1000.0000 mL | Freq: Once | INTRAVENOUS | Status: AC
  Administered 2017-06-06: 1000 mL via INTRAVENOUS

## 2017-06-06 MED ORDER — LEVALBUTEROL HCL 1.25 MG/0.5ML IN NEBU
1.2500 mg | INHALATION_SOLUTION | Freq: Two times a day (BID) | RESPIRATORY_TRACT | Status: DC
  Administered 2017-06-07 – 2017-06-12 (×9): 1.25 mg via RESPIRATORY_TRACT
  Filled 2017-06-06 (×10): qty 0.5

## 2017-06-06 MED ORDER — ENOXAPARIN SODIUM 40 MG/0.4ML ~~LOC~~ SOLN
40.0000 mg | SUBCUTANEOUS | Status: DC
  Administered 2017-06-06 – 2017-06-07 (×2): 40 mg via SUBCUTANEOUS
  Filled 2017-06-06 (×2): qty 0.4

## 2017-06-06 MED ORDER — METOPROLOL TARTRATE 5 MG/5ML IV SOLN
2.5000 mg | Freq: Once | INTRAVENOUS | Status: AC
  Administered 2017-06-06: 2.5 mg via INTRAVENOUS
  Filled 2017-06-06: qty 5

## 2017-06-06 MED ORDER — MAGIC MOUTHWASH
5.0000 mL | Freq: Four times a day (QID) | ORAL | Status: DC | PRN
  Filled 2017-06-06: qty 5

## 2017-06-06 MED ORDER — TAMSULOSIN HCL 0.4 MG PO CAPS
0.4000 mg | ORAL_CAPSULE | Freq: Every morning | ORAL | Status: DC
  Administered 2017-06-07 – 2017-06-12 (×6): 0.4 mg via ORAL
  Filled 2017-06-06 (×6): qty 1

## 2017-06-06 MED ORDER — IPRATROPIUM BROMIDE 0.02 % IN SOLN
0.5000 mg | Freq: Three times a day (TID) | RESPIRATORY_TRACT | Status: DC
  Administered 2017-06-06: 0.5 mg via RESPIRATORY_TRACT
  Filled 2017-06-06: qty 2.5

## 2017-06-06 MED ORDER — LUTEIN 10 MG PO TABS: 10.0000 mg | ORAL_TABLET | Freq: Every day | ORAL | Status: DC

## 2017-06-06 MED ORDER — LORAZEPAM 0.5 MG PO TABS
0.5000 mg | ORAL_TABLET | Freq: Two times a day (BID) | ORAL | Status: DC | PRN
  Administered 2017-06-06 – 2017-06-11 (×5): 0.5 mg via ORAL
  Filled 2017-06-06 (×5): qty 1

## 2017-06-06 MED ORDER — SODIUM CHLORIDE 0.9% FLUSH
10.0000 mL | Freq: Once | INTRAVENOUS | Status: AC
  Administered 2017-06-06: 10 mL
  Filled 2017-06-06: qty 10

## 2017-06-06 MED ORDER — LIDOCAINE-PRILOCAINE 2.5-2.5 % EX CREA
1.0000 "application " | TOPICAL_CREAM | CUTANEOUS | Status: DC | PRN
  Filled 2017-06-06: qty 5

## 2017-06-06 MED ORDER — TRIAMCINOLONE ACETONIDE 55 MCG/ACT NA AERO
1.0000 | INHALATION_SPRAY | Freq: Every day | NASAL | Status: DC
  Administered 2017-06-07 – 2017-06-11 (×4): 1 via NASAL
  Filled 2017-06-06 (×2): qty 10.8

## 2017-06-06 MED ORDER — PROCHLORPERAZINE MALEATE 10 MG PO TABS
10.0000 mg | ORAL_TABLET | Freq: Four times a day (QID) | ORAL | Status: DC | PRN
  Filled 2017-06-06: qty 1

## 2017-06-06 MED ORDER — CALCIUM CARBONATE ANTACID 500 MG PO CHEW
1.0000 | CHEWABLE_TABLET | Freq: Every day | ORAL | Status: DC
  Administered 2017-06-07 – 2017-06-12 (×5): 200 mg via ORAL
  Filled 2017-06-06 (×5): qty 1

## 2017-06-06 MED ORDER — GUAIFENESIN ER 600 MG PO TB12
600.0000 mg | ORAL_TABLET | Freq: Two times a day (BID) | ORAL | Status: DC
  Administered 2017-06-06 – 2017-06-12 (×11): 600 mg via ORAL
  Filled 2017-06-06 (×11): qty 1

## 2017-06-06 MED ORDER — ASPIRIN EC 81 MG PO TBEC
81.0000 mg | DELAYED_RELEASE_TABLET | Freq: Every day | ORAL | Status: DC
  Administered 2017-06-07 – 2017-06-10 (×4): 81 mg via ORAL
  Filled 2017-06-06 (×4): qty 1

## 2017-06-06 MED ORDER — LORATADINE 10 MG PO TABS
10.0000 mg | ORAL_TABLET | Freq: Every day | ORAL | Status: DC
  Administered 2017-06-07 – 2017-06-12 (×5): 10 mg via ORAL
  Filled 2017-06-06 (×5): qty 1

## 2017-06-06 MED ORDER — SODIUM CHLORIDE 0.9 % IV SOLN
INTRAVENOUS | Status: AC
  Administered 2017-06-06 – 2017-06-07 (×2): via INTRAVENOUS

## 2017-06-06 MED ORDER — LIDOCAINE VISCOUS 2 % MT SOLN
20.0000 mL | OROMUCOSAL | Status: DC | PRN
  Filled 2017-06-06: qty 30

## 2017-06-06 MED ORDER — DIPHENOXYLATE-ATROPINE 2.5-0.025 MG PO TABS
2.0000 | ORAL_TABLET | Freq: Four times a day (QID) | ORAL | Status: DC | PRN
  Administered 2017-06-07 – 2017-06-11 (×3): 2 via ORAL
  Filled 2017-06-06 (×3): qty 2

## 2017-06-06 MED ORDER — HEPARIN SOD (PORK) LOCK FLUSH 100 UNIT/ML IV SOLN
250.0000 [IU] | Freq: Once | INTRAVENOUS | Status: AC
  Administered 2017-06-06: 12:00:00
  Filled 2017-06-06: qty 5

## 2017-06-06 MED ORDER — ADULT MULTIVITAMIN W/MINERALS CH
1.0000 | ORAL_TABLET | Freq: Every day | ORAL | Status: DC
  Administered 2017-06-07 – 2017-06-12 (×5): 1 via ORAL
  Filled 2017-06-06 (×5): qty 1

## 2017-06-06 NOTE — Progress Notes (Signed)
PHARMACIST - PHYSICIAN ORDER COMMUNICATION  CONCERNING: P&T Medication Policy on Herbal Medications  DESCRIPTION:  This patient's order for:  lutein  has been noted.  This product(s) is classified as an "herbal" or natural product. Due to a lack of definitive safety studies or FDA approval, nonstandard manufacturing practices, plus the potential risk of unknown drug-drug interactions while on inpatient medications, the Pharmacy and Therapeutics Committee does not permit the use of "herbal" or natural products of this type within [facility].   ACTION TAKEN: The pharmacy department is unable to verify this order at this time and the order has been discontinued. Please reevaluate patient's clinical condition at discharge and address if the herbal or natural product(s) should be resumed at that time.  Royetta Asal, PharmD, BCPS Pager [phone] 06/06/2017 8:31 PM

## 2017-06-06 NOTE — ED Provider Notes (Addendum)
[place] DEPT Provider Note   CSN: [id number] Arrival date & time: 06/06/17  1425     History   Chief Complaint Chief Complaint  Patient presents with  . Polydipsia  . Tachycardia    HPI Mark Howell is a 77 y.o. male.  HPI   77 year old male with past medical history as below including stage IV lung cancer, currently on chemo, as well as rheumatoid arthritis recently on prednisone, who presents with generalized weakness.  The patient states that over the last 1-2 weeks, has had progressive worsening polydipsia, polyuria, and fatigue.  He said little to no appetite.  His last chemotherapy was at [facility], and he has not improved since then.  He endorses the sensation of always having to drink water and he has been urinating every 30-45 minutes.  He has had associated extreme fatigue.  He endorses extreme lightheadedness upon standing.  No fevers.  No abdominal pain.  No rash.  Denies any other complaints.  Of note, he recently completed a high-dose prednisone taper for his arthritis.  Past Medical History:  Diagnosis Date  . Arthritis   . Complication of anesthesia    problems voiding post op  . Full dentures   . GERD (gastroesophageal reflux disease)   . History of radiation therapy 01/25/16-02/28/16   right lung 45 Gy  . History of radiation therapy 04/17/16-05/02/16   right lung boost 20 Gy in 10 fractions cumulative dose 65 gray  . HOH (hard of hearing)   . HTN (hypertension)   . Hyperlipidemia   . Lung mass 12/30/2015  . Odynophagia 04/06/2016  . Psoriatic arthritis ([place])   . Snores   . Wears glasses     Patient Active Problem List   Diagnosis Date Noted  . Sepsis ([place]) 05/09/2017  . Anxiety 04/11/2017  . Port-A-Cath in place 02/19/2017  . Adenocarcinoma of right lung, stage 4 ([place]) 12/27/2016  . Goals of care, counseling/discussion 12/27/2016  . Odynophagia 04/06/2016  . Antineoplastic chemotherapy induced anemia 04/06/2016  .  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 Adenocarcinoma of right lung, stage 2 ([place]) 01/13/2016  . Encounter for antineoplastic chemotherapy 01/13/2016  . Snoring 03/19/2012    Past Surgical History:  Procedure Laterality Date  . COLONOSCOPY    . HEMORRHOID SURGERY N/A 10/01/2013   Procedure: EXAM UNDER ANESTHESIA  AND EXCISIONAL HEMORRHOIDECTOMY WITH HEMORRHOID BANDING X 2;  Surgeon: Gayland Curry, MD;  Location: [facility];  Service: General;  Laterality: N/A;  . INGUINAL HERNIA REPAIR  01/04/2012   Procedure: LAPAROSCOPIC INGUINAL HERNIA;  Surgeon: Gayland Curry, MD,FACS;  Location: [facility];  Service: General;  Laterality: Right;  . IR FLUORO GUIDE PORT INSERTION RIGHT  01/02/2017  . IR US GUIDE VASC ACCESS RIGHT  01/02/2017  . VIDEO BRONCHOSCOPY WITH ENDOBRONCHIAL ULTRASOUND N/A 12/31/2015   Procedure: VIDEO BRONCHOSCOPY WITH ENDOBRONCHIAL ULTRASOUND transbronchial biopsy of node 10 R lymph node;  Surgeon: Grace Isaac, MD;  Location: [facility];  Service: Thoracic;  Laterality: N/A;       Home Medications    Prior to Admission medications   Medication Sig Start Date End Date Taking? Authorizing Provider  aspirin EC 81 MG tablet Take 81 mg by mouth at bedtime.    Yes [provider]  calcium carbonate (TUMS - DOSED IN MG ELEMENTAL CALCIUM) 500 MG chewable tablet Chew 1 tablet by mouth daily.   Yes [provider]  Cetirizine HCl 10 MG TBDP Take  1 tablet by mouth daily.  02/26/17  Yes [provider]  Coenzyme Q10 (CO Q 10 PO) Take 1 tablet by mouth daily.   Yes [provider]  dexamethasone (DECADRON) 4 MG tablet 4 mg by mouth twice a day the day before, day of and day after chemotherapy every 3 weeks. 12/27/16  Yes Curt Bears, MD  Dextromethorphan-Guaifenesin (DELSYM COUGH/CHEST CONGEST DM PO) Take 30 mLs by mouth daily as needed (cold symptoms).   Yes [provider]  diphenhydramine-acetaminophen (TYLENOL PM) 25-500 MG  TABS tablet Take 1 tablet by mouth at bedtime as needed (sleep).   Yes [provider]  diphenoxylate-atropine (LOMOTIL) 2.5-0.025 MG tablet Take 2 tablets by mouth 4 (four) times daily as needed for diarrhea or loose stools. 05/07/17  Yes Tanner, Lyndon Code., PA-C  fluticasone-salmeterol (ADVAIR HFA) 115-21 MCG/ACT inhaler Inhale 2 puffs into the lungs 2 (two) times daily. 05/29/17  Yes Tanner, Lyndon Code., PA-C  folic acid (FOLVITE) 1 MG tablet Take 1 tablet (1 mg total) by mouth daily. 05/23/17  Yes Curt Bears, MD  guaiFENesin (MUCINEX) 600 MG 12 hr tablet Take 1 tablet (600 mg total) by mouth 2 (two) times daily. 05/12/17  Yes Florencia Reasons, MD  lidocaine (XYLOCAINE) 2 % solution Use as directed 20 mLs in the mouth or throat every 3 (three) hours as needed for mouth pain. 05/11/17  Yes Tanner, Lyndon Code., PA-C  lidocaine-prilocaine (EMLA) cream Apply 1 application topically as needed. Patient taking differently: Apply 1 application topically as needed (port access).  12/27/16  Yes Curt Bears, MD  LORazepam (ATIVAN) 0.5 MG tablet Take 1 tablet (0.5 mg total) by mouth 2 (two) times daily as needed for anxiety. 05/23/17  Yes Curt Bears, MD  Lutein 10 MG TABS Take 10 mg by mouth at bedtime.    Yes [provider]  magic mouthwash SOLN Take 5 mLs by mouth 4 (four) times daily as needed for mouth pain. Patient taking differently: Take 5 mLs by mouth 4 (four) times daily as needed for mouth pain. SWISH AND SPIT OUT 01/22/17  Yes Tanner, Lyndon Code., PA-C  Methylcobalamin (METHYL B-12 PO) Take 1 tablet by mouth daily.   Yes [provider]  metoprolol tartrate (LOPRESSOR) 25 MG tablet Take 0.5 tablets (12.5 mg total) by mouth 2 (two) times daily. 05/12/17  Yes Florencia Reasons, MD  Multiple Vitamin (MULITIVITAMIN WITH MINERALS) TABS Take 1 tablet by mouth daily.   Yes [provider]  pantoprazole (PROTONIX) 40 MG tablet Take 40 mg by mouth daily.    Yes [provider]  pravastatin  (PRAVACHOL) 40 MG tablet Take 40 mg by mouth at bedtime.  10/17/13  Yes [provider]  tamsulosin (FLOMAX) 0.4 MG CAPS capsule TAKE (1) CAPSULE DAILY, START 4 DAYS BEFORE PROCEDURE Patient taking differently: take 1 capsule by mouth every morning 11/20/13  Yes Greer Pickerel, MD  triamcinolone (NASACORT) 55 MCG/ACT AERO nasal inhaler Place 1 spray into the nose at bedtime.  08/30/16 08/30/17 Yes [provider]  albuterol (VENTOLIN HFA) 108 (90 Base) MCG/ACT inhaler Inhale 2 puffs into the lungs every 6 (six) hours as needed for wheezing or shortness of breath.  02/20/17   [provider]  benzonatate (TESSALON) 100 MG capsule Take 2 capsules (200 mg total) by mouth 3 (three) times daily as needed for cough. Patient not taking: Reported on 06/06/2017 05/07/17   Sandi Mealy E., PA-C  diphenhydramine-acetaminophen (TYLENOL PM) 25-500 MG TABS Take 1 tablet by  mouth at bedtime. For sleep    [provider]  loperamide (IMODIUM A-D) 2 MG tablet Take 2 mg by mouth daily as needed for diarrhea or loose stools.    [provider]  nystatin (MYCOSTATIN/NYSTOP) powder Apply topically 3 (three) times daily. Apply to groin rash. Patient not taking: Reported on 06/06/2017 02/02/17   Curt Bears, MD  predniSONE (DELTASONE) 5 MG tablet 8 tab x 2 days, 6 tab x 2 days, 4 tab x 2 day, 2 tab x 2 days, then restart previous Rx of 1 tab PO BID Patient not taking: Reported on 06/06/2017 05/29/17   Harle Stanford., PA-C  prochlorperazine (COMPAZINE) 10 MG tablet Take 1 tablet (10 mg total) by mouth every 6 (six) hours as needed for nausea or vomiting. 12/27/16   Curt Bears, MD    Family History Family History  Problem Relation Age of Onset  . Stroke Father   . Diabetes Father   . Heart disease Father   . Cancer Brother        pancreatic    Social History Social History   Tobacco Use  . Smoking status: Former Smoker    Packs/day: 0.50    Years: 50.00    Pack years:  25.00    Types: Cigarettes    Last attempt to quit: 11/30/2015    Years since quitting: 1.5  . Smokeless tobacco: Never Used  Substance Use Topics  . Alcohol use: Yes    Comment: occassionally  . Drug use: No     Allergies   Hydrocodone and Oxycodone   Review of Systems Review of Systems  Constitutional: Positive for appetite change and fatigue.  Gastrointestinal: Positive for nausea.  Endocrine: Positive for polydipsia, polyphagia and polyuria.  Neurological: Positive for weakness.  All other systems reviewed and are negative.    Physical Exam Updated Vital Signs BP 122/78 (BP Location: Right Arm)   Pulse (!) 133   Temp 98.3 F (36.8 C) (Oral)   Resp 20   Ht 6\' 1"  (1.854 m)   Wt 73.9 kg (163 lb)   SpO2 99%   BMI 21.51 kg/m   Physical Exam  Constitutional: He is oriented to person, place, and time. He appears well-developed and well-nourished. No distress.  HENT:  Head: Normocephalic and atraumatic.  Dry mucous membranes  Eyes: Conjunctivae are normal.  Neck: Neck supple.  Cardiovascular: Normal rate, regular rhythm and normal heart sounds. Exam reveals no friction rub.  No murmur heard. Pulmonary/Chest: Effort normal. No respiratory distress. He has no wheezes. He has no rales.  Scattered rhonchi, clear with coughing.  Port site clean, dry, and intact.  Abdominal: Soft. He exhibits no distension.  Musculoskeletal: He exhibits no edema.  Neurological: He is alert and oriented to person, place, and time. He exhibits normal muscle tone.  Skin: Skin is warm. Capillary refill takes less than 2 seconds.  Psychiatric: He has a normal mood and affect.  Nursing note and vitals reviewed.    ED Treatments / Results  Labs (all labs ordered are listed, but only abnormal results are displayed) Labs Reviewed  URINALYSIS, ROUTINE W REFLEX MICROSCOPIC - Abnormal; Notable for the following components:      Result Value   Hgb urine dipstick SMALL (*)    Protein, ur 100  (*)    Squamous Epithelial / LPF 0-5 (*)    All other components within normal limits  CBG MONITORING, ED - Abnormal; Notable for the following components:   Glucose-Capillary  166 (*)    All other components within normal limits  CULTURE, BLOOD (ROUTINE X 2)  CULTURE, BLOOD (ROUTINE X 2)  URINE CULTURE  I-STAT CG4 LACTIC ACID, ED  I-STAT CG4 LACTIC ACID, ED    EKG  EKG Interpretation  Date/Time:  Wednesday June 06 2017 17:07:17 EST Ventricular Rate:  121 PR Interval:    QRS Duration: 86 QT Interval:  291 QTC Calculation: 413 R Axis:   -27 Text Interpretation:  Pacemaker spikes or artifacts Sinus tachycardia Borderline left axis deviation Low voltage, precordial leads No significant change since last tracing Confirmed by Duffy Bruce [phone]) on 06/06/2017 5:20:38 PM       Radiology Dg Chest 2 View  Result Date: 06/06/2017 CLINICAL DATA:  Known lung carcinoma with shortness of Breath EXAM: CHEST  2 VIEW COMPARISON:  05/09/2017 FINDINGS: Cardiac shadow is within normal limits. Aortic calcifications are again seen. Right chest wall port is noted in satisfactory position. Fullness in the right hilar region is again seen slightly less than that seen on the prior exam consistent with post radiation change. No acute infiltrate is seen. Some scarring is noted in the left lung. No acute bony abnormality is noted. IMPRESSION: No significant change from the prior exam. Post radiation changes on the right are noted. Electronically Signed   By: Inez Catalina M.D.   On: 06/06/2017 14:10    Procedures Procedures (including critical care time)  Medications Ordered in ED Medications  sodium chloride 0.9 % bolus 1,000 mL (not administered)  sodium chloride 0.9 % bolus 1,000 mL (1,000 mLs Intravenous New Bag/Given 06/06/17 1515)     Initial Impression / Assessment and Plan / ED Course  I have reviewed the triage vital signs and the nursing notes.  Pertinent labs & imaging results that  were available during my care of the patient were reviewed by me and considered in my medical decision making (see chart for details).     77 year old male with history of stage IV lung cancer currently on chemotherapy here with polydipsia, polyuria, and general fatigue.  On arrival, the patient is tachycardic.  His lab work from clinic today shows bicarb of 19, blood sugar in the 200s, and anion gap of 15.  He has no ketonuria, however, which is reassuring.  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  Given his absence of ketones and otherwise well appearance, and absence of glucosuria, feel this can be managed with fluids and without insulin drip.  Plan to admit to medicine for rehydration and further monitoring.  Final Clinical Impressions(s) / ED Diagnoses   Final diagnoses:  Dehydration  Tachycardia    ED Discharge Orders    None       Duffy Bruce, MD 06/06/17 1652    Duffy Bruce, MD 06/06/17 1721

## 2017-06-06 NOTE — ED Notes (Signed)
ED TO INPATIENT HANDOFF REPORT  Name/Age/Gender Mark Howell 77 y.o. male  Code Status Code Status History    Date Active Date Inactive Code Status Order ID Comments User Context   05/09/2017 18:09 05/12/2017 14:08 Full Code [id number]  Florencia Reasons, MD ED      Home/SNF/Other Home  Chief Complaint cancer center  Level of Care/Admitting Diagnosis ED Disposition    ED Disposition Condition [place]: [facility] [[id number]]  Level of Care: Med-Surg [16]  Diagnosis: Dehydration [276.51.ICD-9-CM]  Admitting Physician: Florencia Reasons [[id number]]  Attending Physician: Florencia Reasons [[id number]]  PT Class (Do Not Modify): Observation [104]  PT Acc Code (Do Not Modify): Observation [10022]       Medical History Past Medical History:  Diagnosis Date  . Arthritis   . Complication of anesthesia    problems voiding post op  . Full dentures   . GERD (gastroesophageal reflux disease)   . History of radiation therapy 01/25/16-02/28/16   right lung 45 Gy  . History of radiation therapy 04/17/16-05/02/16   right lung boost 20 Gy in 10 fractions cumulative dose 65 gray  . HOH (hard of hearing)   . HTN (hypertension)   . Hyperlipidemia   . Lung mass 12/30/2015  . Odynophagia 04/06/2016  . Psoriatic arthritis ([place])   . Snores   . Wears glasses     Allergies Allergies  Allergen Reactions  . Hydrocodone Rash  . Oxycodone Rash    IV Location/Drains/Wounds Patient Lines/Drains/Airways Status   Active Line/Drains/Airways    Name:   Placement date:   Placement time:   Site:   Days:   Implanted Port 05/21/17   05/21/17    -    -   16          Labs/Imaging Results for orders placed or performed during the hospital encounter of 06/06/17 (from the past 48 hour(s))  Urinalysis, Routine w reflex microscopic     Status: Abnormal   Collection Time: 06/06/17  3:08 PM  Result Value Ref Range   Color, Urine YELLOW YELLOW   APPearance CLEAR CLEAR   Specific  Gravity, Urine 1.021 1.005 - 1.030   pH 5.0 5.0 - 8.0   Glucose, UA NEGATIVE NEGATIVE mg/dL   Hgb urine dipstick SMALL (A) NEGATIVE   Bilirubin Urine NEGATIVE NEGATIVE   Ketones, ur NEGATIVE NEGATIVE mg/dL   Protein, ur 100 (A) NEGATIVE mg/dL   Nitrite NEGATIVE NEGATIVE   Leukocytes, UA NEGATIVE NEGATIVE   RBC / HPF 6-30 0 - 5 RBC/hpf   WBC, UA 0-5 0 - 5 WBC/hpf   Bacteria, UA NONE SEEN NONE SEEN   Squamous Epithelial / LPF 0-5 (A) NONE SEEN   Mucus PRESENT     Comment: Performed at [facility], [place] [address] [place],  [address]  I-Stat CG4 Lactic Acid, ED     Status: None   Collection Time: 06/06/17  3:21 PM  Result Value Ref Range   Lactic Acid, Venous 1.44 0.5 - 1.9 mmol/L  POC CBG, ED     Status: Abnormal   Collection Time: 06/06/17  4:44 PM  Result Value Ref Range   Glucose-Capillary 166 (H) 65 - 99 mg/dL  I-Stat CG4 Lactic Acid, ED     Status: None   Collection Time: 06/06/17  5:37 PM  Result Value Ref Range   Lactic Acid, Venous 1.14 0.5 - 1.9 mmol/L  CBG monitoring, ED  Status: Abnormal   Collection Time: 06/06/17  8:55 PM  Result Value Ref Range   Glucose-Capillary 219 (H) 65 - 99 mg/dL   Dg Chest 2 View  Result Date: 06/06/2017 CLINICAL DATA:  Known lung carcinoma with shortness of Breath EXAM: CHEST  2 VIEW COMPARISON:  05/09/2017 FINDINGS: Cardiac shadow is within normal limits. Aortic calcifications are again seen. Right chest wall port is noted in satisfactory position. Fullness in the right hilar region is again seen slightly less than that seen on the prior exam consistent with post radiation change. No acute infiltrate is seen. Some scarring is noted in the left lung. No acute bony abnormality is noted. IMPRESSION: No significant change from the prior exam. Post radiation changes on the right are noted. Electronically Signed   By: Inez Catalina M.D.   On: 06/06/2017 14:10    Pending Labs Unresulted Labs (From admission, onward)    Start     Ordered   06/07/17 0500  CBC with Differential/Platelet  Tomorrow morning,   R     06/06/17 1830   06/07/17 1660  Basic metabolic panel  Tomorrow morning,   R     06/06/17 1830   06/06/17 1851  MRSA PCR Screening  Once,   R     06/06/17 1850   06/06/17 1803  Hemoglobin A1c  Add-on,   R     06/06/17 1802   06/06/17 1509  Blood culture (routine x 2)  BLOOD CULTURE X 2,   STAT    Question:  Patient immune status  Answer:  Normal   06/06/17 1508   06/06/17 1509  Urine culture  STAT,   STAT    Question:  Patient immune status  Answer:  Normal   06/06/17 1508   Signed and Held  CBC  (enoxaparin (LOVENOX)    CrCl >/= 30 ml/min)  Once,   R    Comments:  Baseline for enoxaparin therapy IF NOT ALREADY DRAWN.  Notify MD if PLT < 100 K.    Signed and Held   Signed and Held  Creatinine, serum  (enoxaparin (LOVENOX)    CrCl >/= 30 ml/min)  Once,   R    Comments:  Baseline for enoxaparin therapy IF NOT ALREADY DRAWN.    Signed and Held   Signed and Held  Creatinine, serum  (enoxaparin (LOVENOX)    CrCl >/= 30 ml/min)  Weekly,   R    Comments:  while on enoxaparin therapy    Signed and Held      Vitals/Pain Today's Vitals   06/06/17 1815 06/06/17 1900 06/06/17 1945 06/06/17 2030  BP: 112/78 124/72 100/71 114/69  Pulse: (!) 127 (!) 116 (!) 121 (!) 121  Resp: (!) 22 (!) 27 (!) 24 (!) 27  Temp:      TempSrc:      SpO2: 97% 94% 98% 92%  Weight:      Height:      PainSc:        Isolation Precautions No active isolations  Medications Medications  0.9 %  sodium chloride infusion ( Intravenous New Bag/Given 06/06/17 1909)  sodium bicarbonate tablet 650 mg (650 mg Oral Given 06/06/17 1907)  insulin aspart (novoLOG) injection 0-15 Units (not administered)  levalbuterol (XOPENEX) nebulizer solution 1.25 mg (not administered)  ipratropium (ATROVENT) nebulizer solution 0.5 mg (not administered)  aspirin EC tablet 81 mg (not administered)  calcium carbonate (TUMS - dosed in mg  elemental calcium) chewable tablet 200 mg of elemental  calcium (not administered)  loratadine (CLARITIN) tablet 10 mg (not administered)  diphenoxylate-atropine (LOMOTIL) 2.5-0.025 MG per tablet 2 tablet (not administered)  mometasone-formoterol (DULERA) 200-5 MCG/ACT inhaler 2 puff (not administered)  folic acid (FOLVITE) tablet 1 mg (not administered)  guaiFENesin (MUCINEX) 12 hr tablet 600 mg (not administered)  lidocaine (XYLOCAINE) 2 % viscous mouth solution 20 mL (not administered)  lidocaine-prilocaine (EMLA) cream 1 application (not administered)  loperamide (IMODIUM) capsule 2 mg (not administered)  LORazepam (ATIVAN) tablet 0.5 mg (not administered)  magic mouthwash (not administered)  metoprolol tartrate (LOPRESSOR) tablet 12.5 mg (not administered)  multivitamin with minerals tablet 1 tablet (not administered)  pantoprazole (PROTONIX) EC tablet 40 mg (not administered)  pravastatin (PRAVACHOL) tablet 40 mg (not administered)  prochlorperazine (COMPAZINE) tablet 10 mg (not administered)  tamsulosin (FLOMAX) capsule 0.4 mg (not administered)  triamcinolone (NASACORT) nasal inhaler 1 spray (not administered)  diphenhydrAMINE (BENADRYL) capsule 25 mg (not administered)    And  acetaminophen (TYLENOL) tablet 500 mg (not administered)  sodium chloride 0.9 % bolus 1,000 mL (0 mLs Intravenous Stopped 06/06/17 1910)  sodium chloride 0.9 % bolus 1,000 mL (0 mLs Intravenous Stopped 06/06/17 1615)  metoprolol tartrate (LOPRESSOR) injection 2.5 mg (2.5 mg Intravenous Given 06/06/17 2039)    Mobility walks with device

## 2017-06-06 NOTE — Telephone Encounter (Signed)
"  Weak. Urinating a lot breathing heavy". Labs ordered. Denies fever , dysuria. Per [facility] today. Appt given to wife.

## 2017-06-06 NOTE — ED Triage Notes (Addendum)
Patient from cancer center, where he is being treated for stage 4 lung cancer. Patient c/o polyuria and polydipsia today. Takes prednisone BID, prescribed by rheumatology. CBG 212 and gap 15. Last treatment 1/21. Wife at bedside.

## 2017-06-06 NOTE — H&P (Signed)
History and Physical  ROSCOE Howell Howell:[id number] DOB: 11-16-1940 DOA: 06/06/2017  Referring physician: EDP PCP: [name], MD   Chief Complaint: polydipsia, polyuria   HPI: Mark Howell is a 77 y.o. male   H/o psoriatic arthritis on chronic prednisone, metastatic lung cancer last chemo on 12/31, he was recently admitted to the hospital from January 9 to the 12th due to RSV, bronchitis/pneumonia.  He was seen in oncology clinic on June 21 he is given IV fluids due to dehydration.  He was seen on the 29th in oncology clinic due to wheezing he was started on steroid taper.  He presented to oncology office today due to polydipsia polyuria and dehydration.  He is sent to the ED for further evaluation.  ED course: He has sinus tachycardia heart rate ranged from 120-130, no fever no hypoxia blood pressure stable.  WBC 10.2 hemoglobin 11.  Sodium 130 BUN 31 creatinine 1.36, glucose 212.  Concentric UA, no infection.  He is found to be dehydrated, he received 2 L of normal saline heart rate has improved, hospitalist called to further manage the patient.   Review of Systems:  Detail per HPI, Review of systems are otherwise negative  Past Medical History:  Diagnosis Date  . Arthritis   . Complication of anesthesia    problems voiding post op  . Full dentures   . GERD (gastroesophageal reflux disease)   . History of radiation therapy 01/25/16-02/28/16   right lung 45 Gy  . History of radiation therapy 04/17/16-05/02/16   right lung boost 20 Gy in 10 fractions cumulative dose 65 gray  . HOH (hard of hearing)   . HTN (hypertension)   . Hyperlipidemia   . Lung mass 12/30/2015  . Odynophagia 04/06/2016  . Psoriatic arthritis ([place])   . Snores   . Wears glasses    Past Surgical History:  Procedure Laterality Date  . COLONOSCOPY    . HEMORRHOID SURGERY N/A 10/01/2013   Procedure: EXAM UNDER ANESTHESIA  AND EXCISIONAL HEMORRHOIDECTOMY WITH HEMORRHOID BANDING X 2;  Surgeon: Gayland Curry, MD;   Location: [facility];  Service: General;  Laterality: N/A;  . INGUINAL HERNIA REPAIR  01/04/2012   Procedure: LAPAROSCOPIC INGUINAL HERNIA;  Surgeon: Gayland Curry, MD,FACS;  Location: [facility];  Service: General;  Laterality: Right;  . IR FLUORO GUIDE PORT INSERTION RIGHT  01/02/2017  . IR US GUIDE VASC ACCESS RIGHT  01/02/2017  . VIDEO BRONCHOSCOPY WITH ENDOBRONCHIAL ULTRASOUND N/A 12/31/2015   Procedure: VIDEO BRONCHOSCOPY WITH ENDOBRONCHIAL ULTRASOUND transbronchial biopsy of node 10 R lymph node;  Surgeon: Grace Isaac, MD;  Location: [facility];  Service: Thoracic;  Laterality: N/A;   Social History:  reports that he quit smoking about 18 months ago. His smoking use included cigarettes. He has a 25.00 pack-year smoking history. he has never used smokeless tobacco. He reports that he drinks alcohol. He reports that he does not use drugs. Patient lives at home & is able to participate in activities of daily living independently   Allergies  Allergen Reactions  . Hydrocodone Rash  . Oxycodone Rash    Family History  Problem Relation Age of Onset  . Stroke Father   . Diabetes Father   . Heart disease Father   . Cancer Brother        pancreatic      Prior to Admission medications   Medication Sig Start Date End Date Taking? Authorizing Provider  aspirin EC 81 MG tablet  Take 81 mg by mouth at bedtime.    Yes [provider]  calcium carbonate (TUMS - DOSED IN MG ELEMENTAL CALCIUM) 500 MG chewable tablet Chew 1 tablet by mouth daily.   Yes [provider]  Cetirizine HCl 10 MG TBDP Take 1 tablet by mouth daily.  02/26/17  Yes [provider]  Coenzyme Q10 (CO Q 10 PO) Take 1 tablet by mouth daily.   Yes [provider]  dexamethasone (DECADRON) 4 MG tablet 4 mg by mouth twice a day the day before, day of and day after chemotherapy every 3 weeks. 12/27/16  Yes Curt Bears, MD  Dextromethorphan-Guaifenesin (DELSYM COUGH/CHEST CONGEST DM PO)  Take 30 mLs by mouth daily as needed (cold symptoms).   Yes [provider]  diphenhydramine-acetaminophen (TYLENOL PM) 25-500 MG TABS tablet Take 1 tablet by mouth at bedtime as needed (sleep).   Yes [provider]  diphenoxylate-atropine (LOMOTIL) 2.5-0.025 MG tablet Take 2 tablets by mouth 4 (four) times daily as needed for diarrhea or loose stools. 05/07/17  Yes Tanner, Lyndon Code., PA-C  fluticasone-salmeterol (ADVAIR HFA) 115-21 MCG/ACT inhaler Inhale 2 puffs into the lungs 2 (two) times daily. 05/29/17  Yes Tanner, Lyndon Code., PA-C  folic acid (FOLVITE) 1 MG tablet Take 1 tablet (1 mg total) by mouth daily. 05/23/17  Yes Curt Bears, MD  guaiFENesin (MUCINEX) 600 MG 12 hr tablet Take 1 tablet (600 mg total) by mouth 2 (two) times daily. 05/12/17  Yes Florencia Reasons, MD  lidocaine (XYLOCAINE) 2 % solution Use as directed 20 mLs in the mouth or throat every 3 (three) hours as needed for mouth pain. 05/11/17  Yes Tanner, Lyndon Code., PA-C  lidocaine-prilocaine (EMLA) cream Apply 1 application topically as needed. Patient taking differently: Apply 1 application topically as needed (port access).  12/27/16  Yes Curt Bears, MD  LORazepam (ATIVAN) 0.5 MG tablet Take 1 tablet (0.5 mg total) by mouth 2 (two) times daily as needed for anxiety. 05/23/17  Yes Curt Bears, MD  Lutein 10 MG TABS Take 10 mg by mouth at bedtime.    Yes [provider]  magic mouthwash SOLN Take 5 mLs by mouth 4 (four) times daily as needed for mouth pain. Patient taking differently: Take 5 mLs by mouth 4 (four) times daily as needed for mouth pain. SWISH AND SPIT OUT 01/22/17  Yes Tanner, Lyndon Code., PA-C  Methylcobalamin (METHYL B-12 PO) Take 1 tablet by mouth daily.   Yes [provider]  metoprolol tartrate (LOPRESSOR) 25 MG tablet Take 0.5 tablets (12.5 mg total) by mouth 2 (two) times daily. 05/12/17  Yes Florencia Reasons, MD  Multiple Vitamin (MULITIVITAMIN WITH MINERALS) TABS Take 1 tablet by mouth daily.    Yes [provider]  pantoprazole (PROTONIX) 40 MG tablet Take 40 mg by mouth daily.    Yes [provider]  pravastatin (PRAVACHOL) 40 MG tablet Take 40 mg by mouth at bedtime.  10/17/13  Yes [provider]  tamsulosin (FLOMAX) 0.4 MG CAPS capsule TAKE (1) CAPSULE DAILY, START 4 DAYS BEFORE PROCEDURE Patient taking differently: take 1 capsule by mouth every morning 11/20/13  Yes Greer Pickerel, MD  triamcinolone (NASACORT) 55 MCG/ACT AERO nasal inhaler Place 1 spray into the nose at bedtime.  08/30/16 08/30/17 Yes [provider]  albuterol (VENTOLIN HFA) 108 (90 Base) MCG/ACT inhaler Inhale 2 puffs into the lungs every 6 (six) hours as needed for wheezing or shortness of breath.  02/20/17   [provider]  benzonatate (TESSALON) 100 MG capsule Take 2 capsules (200 mg total) by mouth 3 (three) times daily as needed for cough. Patient not taking: Reported on 06/06/2017 05/07/17   Sandi Mealy E., PA-C  diphenhydramine-acetaminophen (TYLENOL PM) 25-500 MG TABS Take 1 tablet by mouth at bedtime. For sleep    [provider]  loperamide (IMODIUM A-D) 2 MG tablet Take 2 mg by mouth daily as needed for diarrhea or loose stools.    [provider]  nystatin (MYCOSTATIN/NYSTOP) powder Apply topically 3 (three) times daily. Apply to groin rash. Patient not taking: Reported on 06/06/2017 02/02/17   Curt Bears, MD  predniSONE (DELTASONE) 5 MG tablet 8 tab x 2 days, 6 tab x 2 days, 4 tab x 2 day, 2 tab x 2 days, then restart previous Rx of 1 tab PO BID Patient not taking: Reported on 06/06/2017 05/29/17   Harle Stanford., PA-C  prochlorperazine (COMPAZINE) 10 MG tablet Take 1 tablet (10 mg total) by mouth every 6 (six) hours as needed for nausea or vomiting. 12/27/16   Curt Bears, MD    Physical Exam: BP 122/78 (BP Location: Right Arm)   Pulse (!) 133   Temp 98.3 F (36.8 C) (Oral)   Resp 20   Ht 6\' 1"  (1.854 m)   Wt 73.9 kg (163 lb)   SpO2 99%    BMI 21.51 kg/m   General:  NAD Eyes: PERRL ENT: unremarkable Neck: supple, no JVD Cardiovascular: sinus tachycardia  Respiratory: mild scattered wheezes at base, no rales, no rhonchi Abdomen: soft/NT/ND, positive bowel sounds Skin: no rash Musculoskeletal:  No edema Psychiatric: calm/cooperative Neurologic: no focal findings            Labs on Admission:  Basic Metabolic Panel: Recent Labs  Lab 06/06/17 1117  NA 130*  K 4.3  CL 96*  CO2 19*  GLUCOSE 212*  BUN 31*  CREATININE 1.36*  CALCIUM 9.0   Liver Function Tests: Recent Labs  Lab 06/06/17 1117  AST 27  ALT 17  ALKPHOS 97  BILITOT 0.7  PROT 7.8  ALBUMIN 3.2*   No results for input(s): LIPASE, AMYLASE in the last 168 hours. No results for input(s): AMMONIA in the last 168 hours. CBC: Recent Labs  Lab 06/06/17 1117  WBC 10.2  NEUTROABS 7.6*  HGB 11.1*  HCT 33.2*  MCV 97.9  PLT 158   Cardiac Enzymes: No results for input(s): CKTOTAL, CKMB, CKMBINDEX, TROPONINI in the last 168 hours.  BNP (last 3 results) No results for input(s): BNP in the last 8760 hours.  ProBNP (last 3 results) No results for input(s): PROBNP in the last 8760 hours.  CBG: Recent Labs  Lab 06/06/17 1644  GLUCAP 166*    Radiological Exams on Admission: Dg Chest 2 View  Result Date: 06/06/2017 CLINICAL DATA:  Known lung carcinoma with shortness of Breath EXAM: CHEST  2 VIEW COMPARISON:  05/09/2017 FINDINGS: Cardiac shadow is within normal limits. Aortic calcifications are again seen. Right chest wall port is noted in satisfactory position. Fullness in the right hilar region is again seen slightly less than that seen on the prior exam consistent with post radiation change. No acute infiltrate is seen. Some scarring is noted in the left lung. No acute bony abnormality is noted. IMPRESSION: No significant change from the prior exam. Post radiation changes on the right are noted. Electronically Signed   By: Inez Catalina M.D.    On: 06/06/2017 14:10    EKG: Independently  reviewed. Sinus tachycardia, no acute st/t changes  Assessment/Plan Present on Admission: **None**  Elevated blood sugar, likely steroids induced -He presented with polydipsia, polyuria, dehydration -Urine negative for keton -Check a1c, start ssi,  -Diabetes education, carb modified diet, consider discharge on metformin if renal function improves.  Hyponatremia/AKI (h/o CKDIII) -Likely from dehydration -Continue hydration, repeat lab in am  Metabolic acidosis -He does has elevated anion gap at 15, bicarb low at 19 -From aki and chronic intermittent diarhea? -Start sodium bicarb supplement, repeat lab in am  Sinus tachycardia: Continue hydration, continue lopressor  COPD: Stable, no hypoxia, only mild scattered wheezes at basis, start on xopenex/atrovent, continue mucinex Most recent CTA on 1/9" CTA chest no PE, + radiation related changes, fibrosis on the right side,copd."   Metastatic non-small cell lung cancer Last chemo on 12/31 Defer to oncology, Dr Julien Nordmann.   H/o psoriatic arthritis:  Has been off enbrel since cancer diagnosis. He has been on prednisone 5mg  bid at home. Continue home meds.    DVT prophylaxis: lovenox  Consultants: none  Code Status: full   Family Communication:  Patient   Disposition Plan: med surg obs  Time spent: 5mins  Florencia Reasons MD, PhD [facility] Pager [phone] If 7PM-7AM, please contact night-coverage at [email or web address], password [email or web address]

## 2017-06-06 NOTE — ED Notes (Signed)
Bed: WA02 Expected date:  Expected time:  Means of arrival:  Comments: Cancer center

## 2017-06-07 ENCOUNTER — Other Ambulatory Visit: Payer: Self-pay

## 2017-06-07 DIAGNOSIS — R531 Weakness: Secondary | ICD-10-CM

## 2017-06-07 DIAGNOSIS — R0603 Acute respiratory distress: Secondary | ICD-10-CM | POA: Diagnosis not present

## 2017-06-07 DIAGNOSIS — L4050 Arthropathic psoriasis, unspecified: Secondary | ICD-10-CM | POA: Diagnosis present

## 2017-06-07 DIAGNOSIS — H9190 Unspecified hearing loss, unspecified ear: Secondary | ICD-10-CM | POA: Diagnosis present

## 2017-06-07 DIAGNOSIS — E1122 Type 2 diabetes mellitus with diabetic chronic kidney disease: Secondary | ICD-10-CM | POA: Diagnosis present

## 2017-06-07 DIAGNOSIS — R631 Polydipsia: Secondary | ICD-10-CM | POA: Diagnosis present

## 2017-06-07 DIAGNOSIS — J9601 Acute respiratory failure with hypoxia: Secondary | ICD-10-CM | POA: Diagnosis not present

## 2017-06-07 DIAGNOSIS — D630 Anemia in neoplastic disease: Secondary | ICD-10-CM | POA: Diagnosis present

## 2017-06-07 DIAGNOSIS — R7989 Other specified abnormal findings of blood chemistry: Secondary | ICD-10-CM | POA: Diagnosis not present

## 2017-06-07 DIAGNOSIS — K219 Gastro-esophageal reflux disease without esophagitis: Secondary | ICD-10-CM | POA: Diagnosis present

## 2017-06-07 DIAGNOSIS — J189 Pneumonia, unspecified organism: Secondary | ICD-10-CM | POA: Diagnosis not present

## 2017-06-07 DIAGNOSIS — E872 Acidosis: Secondary | ICD-10-CM | POA: Diagnosis present

## 2017-06-07 DIAGNOSIS — I129 Hypertensive chronic kidney disease with stage 1 through stage 4 chronic kidney disease, or unspecified chronic kidney disease: Secondary | ICD-10-CM | POA: Diagnosis present

## 2017-06-07 DIAGNOSIS — R000 Tachycardia, unspecified: Secondary | ICD-10-CM | POA: Diagnosis not present

## 2017-06-07 DIAGNOSIS — R0609 Other forms of dyspnea: Secondary | ICD-10-CM | POA: Diagnosis not present

## 2017-06-07 DIAGNOSIS — T415X5A Adverse effect of therapeutic gases, initial encounter: Secondary | ICD-10-CM | POA: Diagnosis not present

## 2017-06-07 DIAGNOSIS — R040 Epistaxis: Secondary | ICD-10-CM | POA: Diagnosis not present

## 2017-06-07 DIAGNOSIS — C3491 Malignant neoplasm of unspecified part of right bronchus or lung: Secondary | ICD-10-CM | POA: Diagnosis present

## 2017-06-07 DIAGNOSIS — E860 Dehydration: Secondary | ICD-10-CM | POA: Diagnosis not present

## 2017-06-07 DIAGNOSIS — E118 Type 2 diabetes mellitus with unspecified complications: Secondary | ICD-10-CM | POA: Diagnosis not present

## 2017-06-07 DIAGNOSIS — E1165 Type 2 diabetes mellitus with hyperglycemia: Secondary | ICD-10-CM | POA: Diagnosis present

## 2017-06-07 DIAGNOSIS — E785 Hyperlipidemia, unspecified: Secondary | ICD-10-CM | POA: Diagnosis present

## 2017-06-07 DIAGNOSIS — D631 Anemia in chronic kidney disease: Secondary | ICD-10-CM | POA: Diagnosis present

## 2017-06-07 DIAGNOSIS — N183 Chronic kidney disease, stage 3 (moderate): Secondary | ICD-10-CM | POA: Diagnosis present

## 2017-06-07 DIAGNOSIS — T380X5A Adverse effect of glucocorticoids and synthetic analogues, initial encounter: Secondary | ICD-10-CM | POA: Diagnosis present

## 2017-06-07 DIAGNOSIS — D6481 Anemia due to antineoplastic chemotherapy: Secondary | ICD-10-CM | POA: Diagnosis present

## 2017-06-07 DIAGNOSIS — E871 Hypo-osmolality and hyponatremia: Secondary | ICD-10-CM | POA: Diagnosis present

## 2017-06-07 DIAGNOSIS — C3490 Malignant neoplasm of unspecified part of unspecified bronchus or lung: Secondary | ICD-10-CM

## 2017-06-07 DIAGNOSIS — N179 Acute kidney failure, unspecified: Secondary | ICD-10-CM | POA: Diagnosis present

## 2017-06-07 DIAGNOSIS — J439 Emphysema, unspecified: Secondary | ICD-10-CM | POA: Diagnosis present

## 2017-06-07 DIAGNOSIS — D696 Thrombocytopenia, unspecified: Secondary | ICD-10-CM | POA: Diagnosis present

## 2017-06-07 DIAGNOSIS — I361 Nonrheumatic tricuspid (valve) insufficiency: Secondary | ICD-10-CM | POA: Diagnosis not present

## 2017-06-07 DIAGNOSIS — Y95 Nosocomial condition: Secondary | ICD-10-CM | POA: Diagnosis present

## 2017-06-07 DIAGNOSIS — D649 Anemia, unspecified: Secondary | ICD-10-CM | POA: Diagnosis not present

## 2017-06-07 DIAGNOSIS — J440 Chronic obstructive pulmonary disease with acute lower respiratory infection: Secondary | ICD-10-CM | POA: Diagnosis not present

## 2017-06-07 DIAGNOSIS — T451X5A Adverse effect of antineoplastic and immunosuppressive drugs, initial encounter: Secondary | ICD-10-CM | POA: Diagnosis present

## 2017-06-07 LAB — CBC WITH DIFFERENTIAL/PLATELET
Basophils Absolute: 0 10*3/uL (ref 0.0–0.1)
Basophils Relative: 0 %
Eosinophils Absolute: 0.1 10*3/uL (ref 0.0–0.7)
Eosinophils Relative: 1 %
HCT: 26.6 % — ABNORMAL LOW (ref 39.0–52.0)
HEMOGLOBIN: 9.1 g/dL — AB (ref 13.0–17.0)
LYMPHS ABS: 0.7 10*3/uL (ref 0.7–4.0)
LYMPHS PCT: 9 %
MCH: 32.6 pg (ref 26.0–34.0)
MCHC: 34.2 g/dL (ref 30.0–36.0)
MCV: 95.3 fL (ref 78.0–100.0)
Monocytes Absolute: 1 10*3/uL (ref 0.1–1.0)
Monocytes Relative: 13 %
NEUTROS PCT: 77 %
Neutro Abs: 5.4 10*3/uL (ref 1.7–7.7)
Platelets: 100 10*3/uL — ABNORMAL LOW (ref 150–400)
RBC: 2.79 MIL/uL — AB (ref 4.22–5.81)
RDW: 18.5 % — ABNORMAL HIGH (ref 11.5–15.5)
WBC: 7.1 10*3/uL (ref 4.0–10.5)

## 2017-06-07 LAB — GLUCOSE, CAPILLARY
GLUCOSE-CAPILLARY: 142 mg/dL — AB (ref 65–99)
GLUCOSE-CAPILLARY: 132 mg/dL — AB (ref 65–99)
GLUCOSE-CAPILLARY: 131 mg/dL — AB (ref 65–99)

## 2017-06-07 LAB — BASIC METABOLIC PANEL
Anion gap: 11 (ref 5–15)
BUN: 21 mg/dL — ABNORMAL HIGH (ref 6–20)
CHLORIDE: 102 mmol/L (ref 101–111)
CO2: 20 mmol/L — AB (ref 22–32)
Calcium: 7.7 mg/dL — ABNORMAL LOW (ref 8.9–10.3)
Creatinine, Ser: 1.08 mg/dL (ref 0.61–1.24)
GFR calc Af Amer: >60 mL/min (ref >60)
GFR calc non Af Amer: >60 mL/min (ref >60)
GLUCOSE: 143 mg/dL — AB (ref 65–99)
POTASSIUM: 4.1 mmol/L (ref 3.5–5.1)
Sodium: 133 mmol/L — ABNORMAL LOW (ref 135–145)

## 2017-06-07 LAB — URINE CULTURE
Culture: NO GROWTH
Special Requests: NORMAL

## 2017-06-07 MED ORDER — ACETAMINOPHEN 325 MG PO TABS
650.0000 mg | ORAL_TABLET | Freq: Four times a day (QID) | ORAL | Status: DC | PRN
  Administered 2017-06-08 – 2017-06-10 (×3): 650 mg via ORAL
  Filled 2017-06-07 (×4): qty 2

## 2017-06-07 MED ORDER — GLIPIZIDE 5 MG PO TABS
5.0000 mg | ORAL_TABLET | Freq: Every day | ORAL | Status: DC
  Administered 2017-06-08 – 2017-06-11 (×4): 5 mg via ORAL
  Filled 2017-06-07 (×4): qty 1

## 2017-06-07 MED ORDER — IBUPROFEN 200 MG PO TABS
400.0000 mg | ORAL_TABLET | Freq: Once | ORAL | Status: AC
  Administered 2017-06-08: 400 mg via ORAL
  Filled 2017-06-07: qty 2

## 2017-06-07 MED ORDER — SODIUM CHLORIDE 0.9 % IV BOLUS (SEPSIS): 500.0000 mL | Freq: Once | INTRAVENOUS | Status: DC

## 2017-06-07 MED ORDER — LOPERAMIDE HCL 2 MG PO CAPS
2.0000 mg | ORAL_CAPSULE | Freq: Once | ORAL | Status: AC
  Administered 2017-06-07: 2 mg via ORAL
  Filled 2017-06-07: qty 1

## 2017-06-07 MED ORDER — MUPIROCIN 2 % EX OINT
1.0000 "application " | TOPICAL_OINTMENT | Freq: Two times a day (BID) | CUTANEOUS | Status: AC
  Administered 2017-06-07 – 2017-06-11 (×10): 1 via NASAL
  Filled 2017-06-07 (×3): qty 22

## 2017-06-07 MED ORDER — CHLORHEXIDINE GLUCONATE CLOTH 2 % EX PADS
6.0000 | MEDICATED_PAD | Freq: Every day | CUTANEOUS | Status: DC
  Administered 2017-06-07 – 2017-06-08 (×2): 6 via TOPICAL

## 2017-06-07 MED ORDER — LIVING WELL WITH DIABETES BOOK
Freq: Once | Status: AC
  Administered 2017-06-07: 12:00:00
  Filled 2017-06-07: qty 1

## 2017-06-07 MED ORDER — SODIUM CHLORIDE 0.9 % IV BOLUS (SEPSIS)
500.0000 mL | Freq: Once | INTRAVENOUS | Status: AC
  Administered 2017-06-08: 500 mL via INTRAVENOUS

## 2017-06-07 NOTE — Progress Notes (Signed)
These preliminary result these preliminary results were noted.  Awaiting final report.

## 2017-06-07 NOTE — Progress Notes (Signed)
PROGRESS NOTE    Mark Howell  JJO:[id number] DOB: [birth year] DOA: 06/06/2017 PCP: [name], MD   Brief Narrative:  77 year old male with history of psoriatic arthritis on chronic prednisone, metastatic lung cancer on chemotherapy was sent to the hospital due to concerns of polydipsia, polyuria and dehydration.  He was noted to likely from dehydration which has improved.  Denies new diagnosis of diabetes mellitus   Assessment & Plan:   Active Problems:   Dehydration  Mild to moderate dehydration Acute kidney injury, resolved Hyponatremia, at baseline -Patient appears to be doing better with IV fluids -Electrolytes have improved and renal function has returned to his baseline -Mouth still appears dry complains of slight dizziness therefore will order 500 cc of normal saline bolus and maintain IV fluids for today -Provide supportive care  Generalized weakness -Deconditioning along with his comorbidities -Physical therapy and occupational therapy ordered  Diabetes mellitus type 2, new diagnosis - We will start the patient on glipizide.  Due to unfavorable GI side effects from chemo we will hold off on metformin as this may worsen it -Continue Accu-Chek and sliding scale -Appreciate diabetes coordinator input  Sinus tachycardia - Combination of some mild dehydration in deconditioning.  Appears that he is frequently tachycardic even outpatient setting -Continue Lopressor  COPD -Appears to be stable -Nebulizer treatments as needed  Metastatic non-small lung cell carcinoma -Follows outpatient with oncology, Dr Julien Nordmann.     DVT prophylaxis: Lovenox Code Status: Full Family Communication: None at bedside Disposition Plan: Request another day of inpatient hospital stay as he needs IV fluids and feels very weak.  It is my clinical opinion that admission to [facility] is reasonable and necessary in this 77 y.o. male . presenting with symptoms of weakness and dizziness,  concerning for dehydration . in the context of PMH including: Lung cancer on chemotherapy . with pertinent positives on physical exam including: Generally weak appearing, bibasilar crackles . Workup and treatment include getting more IV fluids and working with physical therapy  Given the aforementioned, the predictability of an adverse outcome is felt to be significant. I expect that the patient will require at least 2 midnights in the hospital to treat this condition.    Subjective: Admits of still feeling very weak.  Still admits of having somewhat dry mouth and poor oral intake.  Denies any headache, chest pain, nausea, vomiting.  No acute events overnight.  Objective: Vitals:   06/06/17 2203 06/07/17 0546 06/07/17 0651 06/07/17 1045  BP:  130/77    Pulse:  (!) 132 (!) 116   Resp:  (!) 22    Temp:  99.3 F (37.4 C)    TempSrc:  Oral    SpO2: 93% 93%  94%  Weight:      Height:        Intake/Output Summary (Last 24 hours) at 06/07/2017 1127 Last data filed at 06/07/2017 0615 Gross per 24 hour  Intake 2842.5 ml  Output 200 ml  Net 2642.5 ml   Filed Weights   06/06/17 1439 06/06/17 2138  Weight: 73.9 kg (163 lb) 75.5 kg (166 lb 7.2 oz)    Examination:  General exam: Appears calm and comfortable; generally weak appearing, dry mouth and mucous membrane Respiratory system: Bibasilar crackles otherwise clear to auscultation in the other areas Cardiovascular system: Sinus tachycardia, S1 & S2 heard, RRR. No JVD, murmurs, rubs, gallops or clicks. No pedal edema. Gastrointestinal system: Abdomen is nondistended, soft and nontender. No organomegaly or masses felt. Normal bowel sounds  heard. Central nervous system: Alert and oriented. No focal neurological deficits. Extremities: Symmetric 5 x 5 power. Skin: No rashes, lesions or ulcers Psychiatry: Judgement and insight appear normal. Mood & affect appropriate.     Data Reviewed:   CBC: Recent Labs  Lab 06/06/17 1117  06/06/17 2139 06/07/17 0607  WBC 10.2 7.9 7.1  NEUTROABS 7.6*  --  5.4  HGB 11.1* 9.5* 9.1*  HCT 33.2* 28.5* 26.6*  MCV 97.9 96.9 95.3  PLT 158 111* 086*   Basic Metabolic Panel: Recent Labs  Lab 06/06/17 1117 06/06/17 2139 06/07/17 0607  NA 130*  --  133*  K 4.3  --  4.1  CL 96*  --  102  CO2 19*  --  20*  GLUCOSE 212*  --  143*  BUN 31*  --  21*  CREATININE 1.36* 1.20 1.08  CALCIUM 9.0  --  7.7*   GFR: Estimated Creatinine Clearance: 61.2 mL/min (by C-G formula based on SCr of 1.08 mg/dL). Liver Function Tests: Recent Labs  Lab 06/06/17 1117  AST 27  ALT 17  ALKPHOS 97  BILITOT 0.7  PROT 7.8  ALBUMIN 3.2*   No results for input(s): LIPASE, AMYLASE in the last 168 hours. No results for input(s): AMMONIA in the last 168 hours. Coagulation Profile: No results for input(s): INR, PROTIME in the last 168 hours. Cardiac Enzymes: No results for input(s): CKTOTAL, CKMB, CKMBINDEX, TROPONINI in the last 168 hours. BNP (last 3 results) No results for input(s): PROBNP in the last 8760 hours. HbA1C: Recent Labs    06/06/17 1903  HGBA1C 7.4*   CBG: Recent Labs  Lab 06/06/17 1644 06/06/17 2055 06/07/17 0800  GLUCAP 166* 219* 142*   Lipid Profile: No results for input(s): CHOL, HDL, LDLCALC, TRIG, CHOLHDL, LDLDIRECT in the last 72 hours. Thyroid Function Tests: No results for input(s): TSH, T4TOTAL, FREET4, T3FREE, THYROIDAB in the last 72 hours. Anemia Panel: No results for input(s): VITAMINB12, FOLATE, FERRITIN, TIBC, IRON, RETICCTPCT in the last 72 hours. Sepsis Labs: Recent Labs  Lab 06/06/17 1521 06/06/17 1737  LATICACIDVEN 1.44 1.14    Recent Results (from the past 240 hour(s))  MRSA PCR Screening     Status: Abnormal   Collection Time: 06/06/17  9:16 PM  Result Value Ref Range Status   MRSA by PCR POSITIVE (A) NEGATIVE Final    Comment:        The GeneXpert MRSA Assay (FDA approved for NASAL specimens only), is one component of  a comprehensive MRSA colonization surveillance program. It is not intended to diagnose MRSA infection nor to guide or monitor treatment for MRSA infections. RESULT CALLED TO, READ BACK BY AND VERIFIED WITH: Monia Pouch RN [id number] 06/06/17 A NAVARRO Performed at [facility], [place] [address], [place], [place] [address]          Radiology Studies: Dg Chest 2 View  Result Date: 06/06/2017 CLINICAL DATA:  Known lung carcinoma with shortness of Breath EXAM: CHEST  2 VIEW COMPARISON:  05/09/2017 FINDINGS: Cardiac shadow is within normal limits. Aortic calcifications are again seen. Right chest wall port is noted in satisfactory position. Fullness in the right hilar region is again seen slightly less than that seen on the prior exam consistent with post radiation change. No acute infiltrate is seen. Some scarring is noted in the left lung. No acute bony abnormality is noted. IMPRESSION: No significant change from the prior exam. Post radiation changes on the right are noted. Electronically Signed  By: Inez Catalina M.D.   On: 06/06/2017 14:10        Scheduled Meds: . aspirin EC  81 mg Oral QHS  . calcium carbonate  1 tablet Oral Daily  . Chlorhexidine Gluconate Cloth  6 each Topical Q0600  . enoxaparin (LOVENOX) injection  40 mg Subcutaneous Q24H  . folic acid  1 mg Oral Daily  . [START ON 06/08/2017] glipiZIDE  5 mg Oral QAC breakfast  . guaiFENesin  600 mg Oral BID  . insulin aspart  0-15 Units Subcutaneous TID WC  . ipratropium  0.5 mg Nebulization BID  . levalbuterol  1.25 mg Nebulization BID  . living well with diabetes book   Does not apply Once  . loratadine  10 mg Oral Daily  . metoprolol tartrate  12.5 mg Oral BID  . mometasone-formoterol  2 puff Inhalation BID  . multivitamin with minerals  1 tablet Oral Daily  . mupirocin ointment  1 application Nasal BID  . pantoprazole  40 mg Oral Daily  . pravastatin  40 mg Oral QHS  . sodium bicarbonate  650 mg Oral TID   . tamsulosin  0.4 mg Oral q morning - 10a  . triamcinolone  1 spray Nasal QHS   Continuous Infusions: . sodium chloride 75 mL/hr at 06/07/17 0819  . sodium chloride       LOS: 0 days    Time spent: 35 mins    Ankit Arsenio Loader, MD [facility] Pager [phone]   If 7PM-7AM, please contact night-coverage [email or web address] Password [facility] 06/07/2017, 11:27 AM

## 2017-06-07 NOTE — Progress Notes (Addendum)
Inpatient Diabetes Program Recommendations  AACE/ADA: New Consensus Statement on Inpatient Glycemic Control (2015)  Target Ranges:  Prepandial:   less than 140 mg/dL      Peak postprandial:   less than 180 mg/dL (1-2 hours)      Critically ill patients:  140 - 180 mg/dL   Lab Results  Component Value Date   GLUCAP 142 (H) 06/07/2017   HGBA1C 7.4 (H) 06/06/2017   Review of Glycemic Control  Diabetes history: None/New Onset this admission Current orders for Inpatient glycemic control: Novolog Moderate Correction 0-15 units tid  Inpatient Diabetes Program Recommendations:    A1c 7.4%. Patient has been on Decadron 16m BID around chemotherapy treatments and has also been on prednisone taper. Agree with use of oral DM medications at time of d/c. Ordered the living well with Diabetes booklet. Will speak with patient today.  Will also need glucose meter kit (order # [id number]  1115 am: Spoke with patient about new diabetes diagnosis. Patient seemed overwhelmed. Dicussed A1c level, 7.4% this admission. Dicussed reasons for elevated glucose levels. Discussed Glucose and A1c goals. Discussed briefly about oral medications for DM and to take with food. Discussed how to check glucose and when. Patient requests that I speak with wife. Spoke with patient's RN. RN to call when patient's wife comes to hospital today or tomorrow.  Thanks,  STama HeadingsRN, MSN, PClaxton-Hepburn Medical CenterInpatient Diabetes Coordinator Team Pager [phone](8a-5p)

## 2017-06-07 NOTE — Progress Notes (Signed)
[facility] Progress Note   Mark Howell [id number] 1941-01-08 77 y.o.  Mark Howell is managed by Dr. Eilleen Kempf  Actively treated with chemotherapy: no  Current Therapy: Observation.  Plans are to begin maintenance Alimta on 06/11/2017.  This will be dosed every 3 weeks.  Assessment: Plan:    Urinary tract infection without hematuria, site unspecified - Plan: Urinalysis, Complete w Microscopic, Urine Culture  Cough in adult - Plan: DG Chest 2 View  Type 2 diabetes mellitus with ketoacidosis without coma, without long-term current use of insulin (HCC)  Weakness   Suspected urinary tract infection without hematuria: Urinalysis and urine culture were ordered.  Patient's urinalysis returned with a glucose of 50, small hemoglobin on dipstick, protein at 100, rare bacteria, and 0-5 squamous epithelial cells.  Urine cultures pending.  Cough and shortness of breath: The patient was referred for a chest x-ray.  The patient's chest x-ray was stable when compared to previous chest x-ray of 05/09/2017 and showed postradiation changes.  Type 2 diabetes with ketoacidosis and weakness: The patient appeared to have mild ketoacidosis.  His glucose returned at 212, his CO2 was slightly depressed at 19 with his Deport elevated at 15.  Based on this the patient was transported to the emergency room for management.  Please see After Visit Summary for patient specific instructions.  Future Appointments  Date Time Provider [place]  06/11/2017 10:15 AM [facility] LAB 1 [facility] None  06/11/2017 10:30 AM [facility] FLUSH NURSE [facility] None  06/11/2017 11:00 AM Curcio, Roselie Awkward, NP [facility] None  06/11/2017  1:15 PM [facility] G23 [facility] None    Orders Placed This Encounter  Procedures  . Urine Culture  . DG Chest 2 View  . Urinalysis, Complete w Microscopic       Subjective:   Patient ID:  Mark Howell is a 77 y.o. (DOB [birth year])  male.  Chief Complaint: No chief complaint on file.   HPI Mark Howell is a 77 year old male with a history of a non small cell lung, adenocarcinoma which was originally diagnosed in September 2017.  He was diagnosed as a stage IIa which was unresectable.  He was treated with carboplatin and paclitaxel which was dosed weekly for 7 weeks.  This was followed by carboplatin, Alimta, and Avastin.  Restaging CT scans were completed in January 2019 and showed disease stability.  He was last seen on 05/29/2017 and was treated for an exacerbation of his COPD with the patient given an Advair inhaler a prednisone taper.  He has a history of rheumatoid arthritis and is been on prednisone 5 mg p.o. twice daily for an extended period he presents to the office today with weakness, increased shortness of breath, polydipsia, and polyuria.  His labs returned today showing a glucose of 212.  He is not been given a diagnosis of diabetes and is on no medications for his elevated blood sugar.  Medications: I have reviewed the patient's current medications.  Allergies:  Allergies  Allergen Reactions  . Hydrocodone Rash  . Oxycodone Rash    Past Medical History:  Diagnosis Date  . Arthritis   . Complication of anesthesia    problems voiding post op  . Full dentures   . GERD (gastroesophageal reflux disease)   . History of radiation therapy 01/25/16-02/28/16   right lung 45 Gy  . History of radiation therapy 04/17/16-05/02/16   right lung boost 20 Gy in 10 fractions cumulative dose 65 gray  .  HOH (hard of hearing)   . HTN (hypertension)   . Hyperlipidemia   . Lung mass 12/30/2015  . Odynophagia 04/06/2016  . Psoriatic arthritis ([place])   . Snores   . Wears glasses     Past Surgical History:  Procedure Laterality Date  . COLONOSCOPY    . HEMORRHOID SURGERY N/A 10/01/2013   Procedure: EXAM UNDER ANESTHESIA  AND EXCISIONAL HEMORRHOIDECTOMY WITH HEMORRHOID BANDING X 2;  Surgeon: Gayland Curry, MD;  Location:  [facility];  Service: General;  Laterality: N/A;  . INGUINAL HERNIA REPAIR  01/04/2012   Procedure: LAPAROSCOPIC INGUINAL HERNIA;  Surgeon: Gayland Curry, MD,FACS;  Location: [facility];  Service: General;  Laterality: Right;  . IR FLUORO GUIDE PORT INSERTION RIGHT  01/02/2017  . IR US GUIDE VASC ACCESS RIGHT  01/02/2017  . VIDEO BRONCHOSCOPY WITH ENDOBRONCHIAL ULTRASOUND N/A 12/31/2015   Procedure: VIDEO BRONCHOSCOPY WITH ENDOBRONCHIAL ULTRASOUND transbronchial biopsy of node 10 R lymph node;  Surgeon: Grace Isaac, MD;  Location: [facility];  Service: Thoracic;  Laterality: N/A;    Family History  Problem Relation Age of Onset  . Stroke Father   . Diabetes Father   . Heart disease Father   . Cancer Brother        pancreatic    Social History   Socioeconomic History  . Marital status: Married    Spouse name: Not on file  . Number of children: Not on file  . Years of education: Not on file  . Highest education level: Not on file  Social Needs  . Financial resource strain: Not on file  . Food insecurity - worry: Not on file  . Food insecurity - inability: Not on file  . Transportation needs - medical: Not on file  . Transportation needs - non-medical: Not on file  Occupational History  . Not on file  Tobacco Use  . Smoking status: Former Smoker    Packs/day: 0.50    Years: 50.00    Pack years: 25.00    Types: Cigarettes    Last attempt to quit: 11/30/2015    Years since quitting: 1.5  . Smokeless tobacco: Never Used  Substance and Sexual Activity  . Alcohol use: Yes    Comment: occassionally  . Drug use: No  . Sexual activity: Not Currently  Other Topics Concern  . Not on file  Social History Narrative  . Not on file    Past Medical History, Surgical history, Social history, and Family history were reviewed and updated as appropriate.   Please see review of systems for further details on the patient's review from today.   Review of Systems:  Review of  Systems  Constitutional: Positive for activity change, appetite change and fatigue. Negative for chills, diaphoresis and fever.          HENT: Negative for trouble swallowing.   Respiratory: Positive for shortness of breath. Negative for cough, choking, chest tightness and wheezing.   Cardiovascular: Positive for palpitations. Negative for chest pain.  Gastrointestinal: Negative for constipation, diarrhea, nausea and vomiting.  Endocrine: Positive for polydipsia and polyuria.  Neurological: Positive for weakness.    Objective:   Physical Exam:  BP 132/85 (BP Location: Left Arm, Patient Position: Sitting)   Pulse (!) 132 Comment: told RN  Temp 97.7 F (36.5 C) (Oral)   Resp 19   Ht 6\' 1"  (1.854 m)   Wt 163 lb 4.8 oz (74.1 kg)   SpO2 98%   BMI  21.54 kg/m  ECOG: 1  Physical Exam  Constitutional: No distress.  Patient is an elderly male who is seated in a wheelchair and who appears to be markedly fatigued but in no apparent distress.  HENT:  Head: Normocephalic and atraumatic.  Mucous membranes appear dry  Cardiovascular: S1 normal and S2 normal. Tachycardia present.  Pulmonary/Chest: Effort normal and breath sounds normal. No respiratory distress. He has no wheezes. He has no rales.  Neurological: He is alert. Coordination (The patient is ambulating with the use of a wheelchair.) abnormal.  Skin: He is not diaphoretic.    Lab Review:     Component Value Date/Time   NA 133 (L) 06/07/2017 0607   NA 135 (L) 04/30/2017 0824   K 4.1 06/07/2017 0607   K 4.4 04/30/2017 0824   CL 102 06/07/2017 0607   CO2 20 (L) 06/07/2017 0607   CO2 19 (L) 04/30/2017 0824   GLUCOSE 143 (H) 06/07/2017 0607   GLUCOSE 277 (H) 04/30/2017 0824   BUN 21 (H) 06/07/2017 0607   BUN 28.0 (H) 04/30/2017 0824   CREATININE 1.08 06/07/2017 0607   CREATININE 1.23 05/07/2017 1200   CREATININE 1.5 (H) 04/30/2017 0824   CALCIUM 7.7 (L) 06/07/2017 0607   CALCIUM 7.6 (L) 04/30/2017 0824   PROT 7.8  06/06/2017 1117   PROT 7.7 04/30/2017 0824   ALBUMIN 3.2 (L) 06/06/2017 1117   ALBUMIN 3.7 04/30/2017 0824   AST 27 06/06/2017 1117   AST 55 (H) 05/07/2017 1200   AST 22 04/30/2017 0824   ALT 17 06/06/2017 1117   ALT 52 05/07/2017 1200   ALT 19 04/30/2017 0824   ALKPHOS 97 06/06/2017 1117   ALKPHOS 88 04/30/2017 0824   BILITOT 0.7 06/06/2017 1117   BILITOT 0.7 05/07/2017 1200   BILITOT 0.57 04/30/2017 0824   GFRNONAA >60 06/07/2017 0607   GFRNONAA 55 (L) 05/07/2017 1200   GFRAA >60 06/07/2017 0607   GFRAA >60 05/07/2017 1200       Component Value Date/Time   WBC 7.1 06/07/2017 0607   RBC 2.79 (L) 06/07/2017 0607   HGB 9.1 (L) 06/07/2017 0607   HGB 10.9 (L) 04/30/2017 0824   HCT 26.6 (L) 06/07/2017 0607   HCT 32.7 (L) 04/30/2017 0824   PLT 100 (L) 06/07/2017 0607   PLT 76 (L) 05/07/2017 1200   PLT 118 (L) 04/30/2017 0824   MCV 95.3 06/07/2017 0607   MCV 101.9 (H) 04/30/2017 0824   MCH 32.6 06/07/2017 0607   MCHC 34.2 06/07/2017 0607   RDW 18.5 (H) 06/07/2017 0607   RDW 19.3 (H) 04/30/2017 0824   LYMPHSABS 0.7 06/07/2017 0607   LYMPHSABS 1.3 04/30/2017 0824   MONOABS 1.0 06/07/2017 0607   MONOABS 1.2 (H) 04/30/2017 0824   EOSABS 0.1 06/07/2017 0607   EOSABS 0.0 04/30/2017 0824   BASOSABS 0.0 06/07/2017 0607   BASOSABS 0.0 04/30/2017 0824   -------------------------------  Imaging from last 24 hours (if applicable):  Radiology interpretation: Dg Chest 2 View  Result Date: 06/06/2017 CLINICAL DATA:  Known lung carcinoma with shortness of Breath EXAM: CHEST  2 VIEW COMPARISON:  05/09/2017 FINDINGS: Cardiac shadow is within normal limits. Aortic calcifications are again seen. Right chest wall port is noted in satisfactory position. Fullness in the right hilar region is again seen slightly less than that seen on the prior exam consistent with post radiation change. No acute infiltrate is seen. Some scarring is noted in the left lung. No acute bony abnormality is noted.  IMPRESSION:  No significant change from the prior exam. Post radiation changes on the right are noted. Electronically Signed   By: Inez Catalina M.D.   On: 06/06/2017 14:10   Dg Chest 2 View  Result Date: 05/09/2017 CLINICAL DATA:  Cough and fever.  Lung cancer. EXAM: CHEST  2 VIEW COMPARISON:  05/07/2017 FINDINGS: Progressive enlargement of the right hilum. Given the time frame, this is most likely pneumonia or radiation pneumonitis rather than tumor progression. Left lung remains clear. No heart failure or effusion. Port-A-Cath tip in the lower SVC unchanged. IMPRESSION: Progressive enlargement of the right hilum which may be due to pneumonia or radiation change. Electronically Signed   By: Franchot Gallo M.D.   On: 05/09/2017 15:35   Ct Angio Chest Pe W And/or Wo Contrast  Result Date: 05/09/2017 CLINICAL DATA:  77 year old male with history of metastatic non-small cell lung cancer on chemotherapy presenting with history of fever, weakness and shortness of breath for the past 4 weeks. EXAM: CT ANGIOGRAPHY CHEST WITH CONTRAST TECHNIQUE: Multidetector CT imaging of the chest was performed using the standard protocol during bolus administration of intravenous contrast. Multiplanar CT image reconstructions and MIPs were obtained to evaluate the vascular anatomy. CONTRAST:  146mL ISOVUE-370 IOPAMIDOL (ISOVUE-370) INJECTION 76% COMPARISON:  Chest CT 03/08/2017. FINDINGS: Cardiovascular: Today's study is limited by considerable patient respiratory motion. With this limitation in mind, there is no central, lobar or proximal segmental sized pulmonary embolism. More distal segmental or subsegmental sized emboli cannot be entirely excluded secondary to respiratory motion. Heart size is normal. There is no significant pericardial fluid, thickening or pericardial calcification. There is aortic atherosclerosis, as well as atherosclerosis of the great vessels of the mediastinum and the coronary arteries, including calcified  atherosclerotic plaque in the left anterior descending coronary artery. Calcification of the aortic valve. Right internal jugular single-lumen porta cath with tip terminating at the superior cavoatrial junction. Mediastinum/Nodes: Extensive soft tissue thickening in the right hilar region likely related to prior radiation therapy and similar to the prior examination. No pathologically enlarged mediastinal or left hilar lymph nodes. Esophagus is unremarkable in appearance. No axillary lymphadenopathy. Lungs/Pleura: Increasing mass-like architectural distortion throughout the medial aspect of the right lung emanating outward from the hilar region, likely to reflect progressive postradiation mass-like fibrosis. No definite suspicious appearing pulmonary nodule or mass to strongly suggest recurrent or metastatic disease in the lungs. Several nodular areas of architectural distortion in the periphery of the left lung appear stable compared to prior examinations, most compatible with areas of post infectious or inflammatory scarring. No acute consolidative airspace disease. No pleural effusions. Diffuse bronchial wall thickening with mild centrilobular and paraseptal emphysema. Upper Abdomen: Aortic atherosclerosis. 1.8 cm right adrenal nodule is unchanged. Left adrenal mass has clearly enlarged, currently measuring 3.3 cm in diameter. Musculoskeletal: There are no aggressive appearing lytic or blastic lesions noted in the visualized portions of the skeleton. Review of the MIP images confirms the above findings. IMPRESSION: 1. Although today's study is limited by considerable patient respiratory motion there is no evidence of central, lobar or proximal segmental sized pulmonary embolism. 2. Evolving chronic postradiation changes of mass-like fibrosis throughout the medial aspect of the right lung. 3. 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 4. Aortic atherosclerosis, in  addition to left anterior descending coronary artery disease. 5. There are calcifications of the aortic valve and mitral annulus. Echocardiographic correlation for evaluation of potential valvular dysfunction may be warranted if clinically indicated. 6.  Mild diffuse bronchial thickening with mild centrilobular and paraseptal emphysema; imaging findings suggestive of underlying COPD. Aortic Atherosclerosis (ICD10-I70.0) and Emphysema (ICD10-J43.9). Electronically Signed   By: Vinnie Langton M.D.   On: 05/09/2017 18:57   Ct Abdomen Pelvis W Contrast  Result Date: 05/17/2017 CLINICAL DATA:  77 year old male with history of lung cancer diagnosed in 2017 status post chemotherapy and radiation therapy now completed. Restaging examination. EXAM: CT ABDOMEN AND PELVIS WITH CONTRAST TECHNIQUE: Multidetector CT imaging of the abdomen and pelvis was performed using the standard protocol following bolus administration of intravenous contrast. CONTRAST:  167mL ISOVUE-300 IOPAMIDOL (ISOVUE-300) INJECTION 61% COMPARISON:  CT of the abdomen and pelvis 03/08/2017. FINDINGS: Lower chest: Atherosclerotic calcifications in the left anterior descending and right coronary arteries. Calcifications of the aortic valve. Postradiation changes in the medial aspect of the right lung, similar to recent chest CT. Hepatobiliary: No cystic or solid hepatic lesions. No intra or extrahepatic biliary ductal dilatation. Gallbladder is normal in appearance. Pancreas: No pancreatic mass. No pancreatic ductal dilatation. No pancreatic or peripancreatic fluid or inflammatory changes. Spleen: Unremarkable. 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 Multiple nonobstructive calculi are noted within the collecting systems of both kidneys, measuring up to 6 mm in the interpolar region of the right kidney. Multifocal cortical thinning, compatible with  chronic scarring in both kidneys. 13 mm simple cyst in the lower pole of the left kidney. No hydroureteronephrosis. Urinary bladder is partially decompressed, but is otherwise unremarkable in appearance. Stomach/Bowel: Normal appearance of the stomach. No pathologic dilatation of small bowel or colon. Numerous colonic diverticulae are noted, without surrounding inflammatory changes to suggest an acute diverticulitis at this time. Normal appendix. 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 No lymphadenopathy noted in the abdomen or pelvis. Reproductive: Prostate gland is enlarged and heterogeneous in appearance with median lobe hypertrophy measuring 3.9 x 5.3 x 5.5 cm. Seminal vesicles are unremarkable in appearance. Other: No significant volume of ascites.  No pneumoperitoneum. Musculoskeletal: There are no aggressive appearing lytic or blastic lesions noted in the visualized portions of the skeleton. IMPRESSION: 1. Bilateral adrenal metastases redemonstrated, as above. 2. No new signs of metastatic disease elsewhere in the abdomen or pelvis. 3. Aortic atherosclerosis with coronary artery disease and aneurysmal dilatation of the common iliac arteries measuring up to 17 mm in diameter bilaterally. 4. Colonic diverticulosis without evidence of acute diverticulitis at this time. 5. Prostatomegaly with median lobe hypertrophy of the prostate gland and mild thickening of the urinary bladder, which could suggest chronic bladder outlet obstruction. 6. There are calcifications of the aortic valve. Echocardiographic correlation for evaluation of potential valvular dysfunction may be warranted if clinically indicated. 7. Additional incidental findings, as above. Aortic Atherosclerosis (ICD10-I70.0). Electronically Signed   By: Vinnie Langton M.D.   On: 05/17/2017 15:10        This case was discussed with Dr.  Julien Nordmann. He expressed agreement with my management of this patient.

## 2017-06-07 NOTE — Evaluation (Signed)
Physical Therapy Evaluation Patient Details Name: Mark Howell MRN: [id number] DOB: 1940/05/26 Today's Date: 06/07/2017   History of Present Illness  77 year old male with history of psoriatic arthritis on chronic prednisone, metastatic lung cancer on chemotherapy was sent to the hospital due to concerns of polydipsia, polyuria and dehydration  Clinical Impression  Pt admitted with above diagnosis. Pt currently with functional limitations due to the deficits listed below (see PT Problem List). MOd assist for bed mobility and to transfer to recliner with RW, activity tolerance limited by 3/4 dyspnea and fatigue. Unable to ambulate.  Pt will benefit from skilled PT to increase their independence and safety with mobility to allow discharge to the venue listed below.       Follow Up Recommendations Home health PT;Supervision for mobility/OOB(pt/wife declined SNF)    Equipment Recommendations  3in1 (PT)    Recommendations for Other Services       Precautions / Restrictions Precautions Precautions: Fall Precaution Comments: fell in ED with this admission (tripped on slipper), denies other falls this past 1 year Restrictions Weight Bearing Restrictions: No      Mobility  Bed Mobility Overal bed mobility: Needs Assistance Bed Mobility: Supine to Sit     Supine to sit: Mod assist     General bed mobility comments: assist to raise trunk and advance BLEs to EOB  Transfers Overall transfer level: Needs assistance Equipment used: Rolling walker (2 wheeled) Transfers: Sit to/from [facility] Sit to Stand: Mod assist Stand pivot transfers: Min assist       General transfer comment: increased time and effort, assist to power up and for balance with SPT, 3/4 dyspnea with activity (pt/wife report this is baseline and that he doesn't use O2)  Ambulation/Gait                Stairs            Wheelchair Mobility    Modified Rankin (Stroke Patients  Only)       Balance Overall balance assessment: Needs assistance   Sitting balance-Leahy Scale: Fair     Standing balance support: Bilateral upper extremity supported Standing balance-Leahy Scale: Poor Standing balance comment: relies on BUE support                             Pertinent Vitals/Pain Pain Assessment: No/denies pain    Home Living Family/patient expects to be discharged to:: Private residence Living Arrangements: Spouse/significant other Available Help at Discharge: Family;Available 24 hours/day Type of Home: House Home Access: Stairs to enter   [employer] of Steps: 1 Home Layout: One level Home Equipment: Environmental consultant - 2 wheels      Prior Function Level of Independence: Needs assistance   Gait / Transfers Assistance Needed: furniture walks  ADL's / Homemaking Assistance Needed: assist for bathing/dressing  Comments: held onto walls/furniture at home     Hand Dominance        Extremity/Trunk Assessment   Upper Extremity Assessment Upper Extremity Assessment: Generalized weakness    Lower Extremity Assessment Lower Extremity Assessment: Generalized weakness(knee ext 4/5)    Cervical / Trunk Assessment Cervical / Trunk Assessment: Normal  Communication   Communication: No difficulties  Cognition Arousal/Alertness: Awake/alert Behavior During Therapy: WFL for tasks assessed/performed Overall Cognitive Status: Within Functional Limits for tasks assessed  General Comments      Exercises     Assessment/Plan    PT Assessment Patient needs continued PT services  PT Problem List Decreased strength;Decreased activity tolerance;Decreased balance;Decreased mobility       PT Treatment Interventions DME instruction;Gait training;Therapeutic exercise;Functional mobility training;Therapeutic activities;Balance training    PT Goals (Current goals can be found in the Care  Plan section)  Acute Rehab PT Goals Patient Stated Goal: build cabinets PT Goal Formulation: With patient/family Time For Goal Achievement: 06/21/17 Potential to Achieve Goals: Fair    Frequency Min 3X/week   Barriers to discharge        Co-evaluation               AM-PAC PT "6 Clicks" Daily Activity  Outcome Measure Difficulty turning over in bed (including adjusting bedclothes, sheets and blankets)?: Unable Difficulty moving from lying on back to sitting on the side of the bed? : Unable Difficulty sitting down on and standing up from a chair with arms (e.g., wheelchair, bedside commode, etc,.)?: Unable Help needed moving to and from a bed to chair (including a wheelchair)?: A Lot Help needed walking in hospital room?: Total Help needed climbing 3-5 steps with a railing? : Total 6 Click Score: 7    End of Session Equipment Utilized During Treatment: Gait belt Activity Tolerance: Patient limited by fatigue Patient left: in bed;with call bell/phone within reach;with bed alarm set;with family/visitor present Nurse Communication: Mobility status PT Visit Diagnosis: Muscle weakness (generalized) (M62.81);Difficulty in walking, not elsewhere classified (R26.2)    Time: 3202-3343 PT Time Calculation (min) (ACUTE ONLY): 23 min   Charges:   PT Evaluation $PT Eval Moderate Complexity: 1 Mod PT Treatments $Therapeutic Activity: 8-22 mins   PT G Codes:         Philomena Doheny 06/07/2017, 2:19 PM [phone]

## 2017-06-08 ENCOUNTER — Inpatient Hospital Stay (HOSPITAL_COMMUNITY): Payer: Medicare Other

## 2017-06-08 ENCOUNTER — Encounter (HOSPITAL_COMMUNITY): Payer: Self-pay | Admitting: Radiology

## 2017-06-08 DIAGNOSIS — R0603 Acute respiratory distress: Secondary | ICD-10-CM

## 2017-06-08 DIAGNOSIS — E860 Dehydration: Secondary | ICD-10-CM

## 2017-06-08 DIAGNOSIS — J9601 Acute respiratory failure with hypoxia: Secondary | ICD-10-CM

## 2017-06-08 DIAGNOSIS — R7989 Other specified abnormal findings of blood chemistry: Secondary | ICD-10-CM

## 2017-06-08 LAB — CBC
HEMATOCRIT: 23.6 % — AB (ref 39.0–52.0)
HEMOGLOBIN: 8.1 g/dL — AB (ref 13.0–17.0)
MCH: 32.5 pg (ref 26.0–34.0)
MCHC: 34.3 g/dL (ref 30.0–36.0)
MCV: 94.8 fL (ref 78.0–100.0)
Platelets: 84 10*3/uL — ABNORMAL LOW (ref 150–400)
RBC: 2.49 MIL/uL — ABNORMAL LOW (ref 4.22–5.81)
RDW: 18.5 % — AB (ref 11.5–15.5)
WBC: 6.1 10*3/uL (ref 4.0–10.5)

## 2017-06-08 LAB — BASIC METABOLIC PANEL
ANION GAP: 10 (ref 5–15)
BUN: 18 mg/dL (ref 6–20)
CALCIUM: 6.9 mg/dL — AB (ref 8.9–10.3)
CO2: 19 mmol/L — ABNORMAL LOW (ref 22–32)
Chloride: 103 mmol/L (ref 101–111)
Creatinine, Ser: 1.19 mg/dL (ref 0.61–1.24)
GFR calc Af Amer: >60 mL/min (ref >60)
GFR, EST NON AFRICAN AMERICAN: 57 mL/min — AB (ref >60)
Glucose, Bld: 137 mg/dL — ABNORMAL HIGH (ref 65–99)
Potassium: 3.9 mmol/L (ref 3.5–5.1)
SODIUM: 132 mmol/L — AB (ref 135–145)

## 2017-06-08 LAB — BRAIN NATRIURETIC PEPTIDE: B NATRIURETIC PEPTIDE 5: 206.1 pg/mL — AB (ref 0.0–100.0)

## 2017-06-08 LAB — RESPIRATORY PANEL BY PCR
Adenovirus: NOT DETECTED
Bordetella pertussis: NOT DETECTED
CHLAMYDOPHILA PNEUMONIAE-RVPPCR: NOT DETECTED
CORONAVIRUS 229E-RVPPCR: NOT DETECTED
CORONAVIRUS HKU1-RVPPCR: NOT DETECTED
CORONAVIRUS NL63-RVPPCR: NOT DETECTED
Coronavirus OC43: NOT DETECTED
INFLUENZA A H3-RVPPCR: NOT DETECTED
INFLUENZA B-RVPPCR: NOT DETECTED
Influenza A H1 2009: NOT DETECTED
Influenza A H1: NOT DETECTED
Influenza A: NOT DETECTED
Metapneumovirus: NOT DETECTED
Mycoplasma pneumoniae: NOT DETECTED
PARAINFLUENZA VIRUS 3-RVPPCR: NOT DETECTED
Parainfluenza Virus 1: NOT DETECTED
Parainfluenza Virus 2: NOT DETECTED
Parainfluenza Virus 4: NOT DETECTED
Respiratory Syncytial Virus: NOT DETECTED
Rhinovirus / Enterovirus: NOT DETECTED

## 2017-06-08 LAB — BLOOD GAS, ARTERIAL
Acid-base deficit: 4.7 mmol/L — ABNORMAL HIGH (ref 0.0–2.0)
BICARBONATE: 16.6 mmol/L — AB (ref 20.0–28.0)
DELIVERY SYSTEMS: POSITIVE
Drawn by: 103701
FIO2: 100
PATIENT TEMPERATURE: 37
PEEP: 8 cmH2O
PRESSURE CONTROL: 14 cmH2O
RATE: 10 resp/min
pCO2 arterial: 20.4 mmHg — ABNORMAL LOW (ref 32.0–48.0)
pH, Arterial: 7.52 — ABNORMAL HIGH (ref 7.350–7.450)
pO2, Arterial: 445 mmHg — ABNORMAL HIGH (ref 83.0–108.0)

## 2017-06-08 LAB — GLUCOSE, CAPILLARY
GLUCOSE-CAPILLARY: 126 mg/dL — AB (ref 65–99)
GLUCOSE-CAPILLARY: 128 mg/dL — AB (ref 65–99)
GLUCOSE-CAPILLARY: 209 mg/dL — AB (ref 65–99)
Glucose-Capillary: 127 mg/dL — ABNORMAL HIGH (ref 65–99)
Glucose-Capillary: 161 mg/dL — ABNORMAL HIGH (ref 65–99)
Glucose-Capillary: 193 mg/dL — ABNORMAL HIGH (ref 65–99)
Glucose-Capillary: 200 mg/dL — ABNORMAL HIGH (ref 65–99)

## 2017-06-08 LAB — MAGNESIUM: MAGNESIUM: 0.6 mg/dL — AB (ref 1.7–2.4)

## 2017-06-08 LAB — D-DIMER, QUANTITATIVE (NOT AT ARMC): D DIMER QUANT: 2.58 ug{FEU}/mL — AB (ref 0.00–0.50)

## 2017-06-08 LAB — PROCALCITONIN: Procalcitonin: 1.02 ng/mL

## 2017-06-08 LAB — TROPONIN I
TROPONIN I: 0.05 ng/mL — AB (ref <0.03)
Troponin I: 0.03 ng/mL (ref <0.03)

## 2017-06-08 LAB — INFLUENZA PANEL BY PCR (TYPE A & B)
Influenza A By PCR: NEGATIVE
Influenza B By PCR: NEGATIVE

## 2017-06-08 LAB — LACTIC ACID, PLASMA: LACTIC ACID, VENOUS: 2 mmol/L — AB (ref 0.5–1.9)

## 2017-06-08 MED ORDER — VANCOMYCIN HCL 10 G IV SOLR
1500.0000 mg | Freq: Once | INTRAVENOUS | Status: AC
  Administered 2017-06-08: 1500 mg via INTRAVENOUS
  Filled 2017-06-08: qty 1500

## 2017-06-08 MED ORDER — PIPERACILLIN-TAZOBACTAM 3.375 G IVPB
3.3750 g | Freq: Three times a day (TID) | INTRAVENOUS | Status: DC
  Administered 2017-06-08 – 2017-06-12 (×12): 3.375 g via INTRAVENOUS
  Filled 2017-06-08 (×10): qty 50

## 2017-06-08 MED ORDER — METHYLPREDNISOLONE SODIUM SUCC 125 MG IJ SOLR
INTRAMUSCULAR | Status: AC
  Filled 2017-06-08: qty 2

## 2017-06-08 MED ORDER — INSULIN ASPART 100 UNIT/ML ~~LOC~~ SOLN
0.0000 [IU] | SUBCUTANEOUS | Status: DC
  Administered 2017-06-09: 5 [IU] via SUBCUTANEOUS
  Administered 2017-06-09: 11 [IU] via SUBCUTANEOUS
  Administered 2017-06-09 (×2): 3 [IU] via SUBCUTANEOUS
  Administered 2017-06-09: 5 [IU] via SUBCUTANEOUS
  Administered 2017-06-09: 11 [IU] via SUBCUTANEOUS
  Administered 2017-06-10: 3 [IU] via SUBCUTANEOUS
  Administered 2017-06-10: 8 [IU] via SUBCUTANEOUS
  Administered 2017-06-10: 3 [IU] via SUBCUTANEOUS
  Administered 2017-06-10: 8 [IU] via SUBCUTANEOUS
  Administered 2017-06-10: 5 [IU] via SUBCUTANEOUS

## 2017-06-08 MED ORDER — ENOXAPARIN SODIUM 80 MG/0.8ML ~~LOC~~ SOLN
1.0000 mg/kg | Freq: Two times a day (BID) | SUBCUTANEOUS | Status: DC
  Administered 2017-06-08: 13:00:00 75 mg via SUBCUTANEOUS
  Filled 2017-06-08: qty 0.75

## 2017-06-08 MED ORDER — FUROSEMIDE 10 MG/ML IJ SOLN
40.0000 mg | Freq: Once | INTRAMUSCULAR | Status: AC
  Administered 2017-06-08: 40 mg via INTRAVENOUS

## 2017-06-08 MED ORDER — SODIUM CHLORIDE 0.9 % IJ SOLN
INTRAMUSCULAR | Status: AC
  Administered 2017-06-08: 17:00:00
  Filled 2017-06-08: qty 50

## 2017-06-08 MED ORDER — IOPAMIDOL (ISOVUE-370) INJECTION 76%
INTRAVENOUS | Status: AC
  Administered 2017-06-08: 100 mL via INTRAVENOUS
  Filled 2017-06-08: qty 100

## 2017-06-08 MED ORDER — VANCOMYCIN HCL IN DEXTROSE 1-5 GM/200ML-% IV SOLN
1000.0000 mg | INTRAVENOUS | Status: DC
  Administered 2017-06-09 – 2017-06-10 (×2): 1000 mg via INTRAVENOUS
  Filled 2017-06-08 (×2): qty 200

## 2017-06-08 MED ORDER — METHYLPREDNISOLONE SODIUM SUCC 125 MG IJ SOLR
60.0000 mg | Freq: Once | INTRAMUSCULAR | Status: AC
  Administered 2017-06-08: 60 mg via INTRAVENOUS

## 2017-06-08 MED ORDER — FUROSEMIDE 10 MG/ML IJ SOLN
40.0000 mg | Freq: Once | INTRAMUSCULAR | Status: AC
  Administered 2017-06-08: 40 mg via INTRAVENOUS
  Filled 2017-06-08: qty 4

## 2017-06-08 MED ORDER — MAGNESIUM SULFATE 4 GM/100ML IV SOLN
4.0000 g | Freq: Once | INTRAVENOUS | Status: AC
  Administered 2017-06-08: 4 g via INTRAVENOUS
  Filled 2017-06-08: qty 100

## 2017-06-08 MED ORDER — LEVALBUTEROL HCL 0.63 MG/3ML IN NEBU
0.6300 mg | INHALATION_SOLUTION | Freq: Once | RESPIRATORY_TRACT | Status: AC
  Administered 2017-06-08: 0.63 mg via RESPIRATORY_TRACT
  Filled 2017-06-08: qty 3

## 2017-06-08 MED ORDER — ENOXAPARIN SODIUM 40 MG/0.4ML ~~LOC~~ SOLN
40.0000 mg | SUBCUTANEOUS | Status: DC
  Administered 2017-06-09 – 2017-06-11 (×3): 40 mg via SUBCUTANEOUS
  Filled 2017-06-08 (×3): qty 0.4

## 2017-06-08 MED ORDER — SODIUM CHLORIDE 0.9 % IV BOLUS (SEPSIS)
500.0000 mL | Freq: Once | INTRAVENOUS | Status: AC
  Administered 2017-06-08: 500 mL via INTRAVENOUS

## 2017-06-08 MED ORDER — METHYLPREDNISOLONE SODIUM SUCC 40 MG IJ SOLR
40.0000 mg | Freq: Four times a day (QID) | INTRAMUSCULAR | Status: DC
  Administered 2017-06-08 – 2017-06-09 (×4): 40 mg via INTRAVENOUS
  Filled 2017-06-08 (×4): qty 1

## 2017-06-08 MED ORDER — PIPERACILLIN-TAZOBACTAM 3.375 G IVPB 30 MIN
3.3750 g | Freq: Once | INTRAVENOUS | Status: AC
  Administered 2017-06-08: 3.375 g via INTRAVENOUS
  Filled 2017-06-08 (×2): qty 50

## 2017-06-08 NOTE — Progress Notes (Signed)
CT reviewed- No PE. Will change treatment lovenox to DVT prophylaxis Continue antibiotics, steroids, lasix.  Now off bipap with stable resp status. Continue monitoring in SDU.  Marshell Garfinkel MD [facility] Pager [phone] If no answer or after 3pm call: [phone] 06/08/2017, 5:28 PM

## 2017-06-08 NOTE — Progress Notes (Signed)
Pharmacy Antibiotic Note  Mark Howell is a 77 y.o. male admitted on 06/06/2017 with pneumonia.  Pharmacy has been consulted for vancomycin and zosyn dosing.  Noted immunocompromised state due to chronic steroids, chemotherapy.  Plan: Vancomycin 1500 mg x 1 then 1g IV q24h for AUC goal 400-500. Zosyn 3.375g IV q8h (4 hour infusion time).  Daily SCr.  Height: 6\' 1"  (185.4 cm) Weight: 166 lb 7.2 oz (75.5 kg) IBW/kg (Calculated) : 79.9  Temp (24hrs), Avg:99.3 F (37.4 C), Min:97.9 F (36.6 C), Max:102.9 F (39.4 C)  Recent Labs  Lab 06/06/17 1117 06/06/17 1521 06/06/17 1737 06/06/17 2139 06/07/17 0607 06/08/17 0418  WBC 10.2  --   --  7.9 7.1 6.1  CREATININE 1.36*  --   --  1.20 1.08 1.19  LATICACIDVEN  --  1.44 1.14  --   --   --     Estimated Creatinine Clearance: 55.5 mL/min (by C-G formula based on SCr of 1.19 mg/dL).    Allergies  Allergen Reactions  . Hydrocodone Rash  . Oxycodone Rash    Antimicrobials this admission: 2/8 Vancomcyin >>  2/8 Zosyn >>   Dose adjustments this admission:  Microbiology results: 2/6 BCx: ngtd 2/6 UCx: NGF  2/6 MRSA PCR: +  Thank you for allowing pharmacy to be a part of this patient's care.  Hershal Coria 06/08/2017 10:32 AM

## 2017-06-08 NOTE — Progress Notes (Signed)
RN went to patient's room and found in respiratory distress. Bed elevated. Vital Signs obtained. Patient O2 sat on 5L 76. HR 126. RR 34. Md paged. Rapid response called. RT called. Patient put on non-re breather. Per MD, patient given 60mg  of solu-mederol per verbal order. Patient transferred to stepdown unit.

## 2017-06-08 NOTE — Care Management Note (Signed)
Case Management Note  Patient Details  Name: Mark Howell MRN: [id number] Date of Birth: 26-Mar-1941  Subjective/Objective:                  Weakness and dehydration receiving iv flds  Action/Plan: Date: June 08, 2017 Velva Harman, BSN, [place], [place] [phone] Chart and notes review for patient progress and needs. Will follow for case management and discharge needs. No cm or discharge needs present at time of this review. Next review date: [id number]  Expected Discharge Date:                  Expected Discharge Plan:  Home/Self Care  In-House Referral:     Discharge planning Services  CM Consult  Post Acute Care Choice:    Choice offered to:     DME Arranged:    DME Agency:     HH Arranged:    HH Agency:     Status of Service:  In process, will continue to follow  If discussed at Long Length of Stay Meetings, dates discussed:    Additional Comments:  Leeroy Cha, RN 06/08/2017, 9:07 AM

## 2017-06-08 NOTE — Significant Event (Signed)
Rapid Response Event Note  Overview: Time Called: 0845 Arrival Time: 0850 Event Type: Respiratory Called to 1521 due to patient needing rapid response. Upon entering the room, patient was in respiratory distress. Patient on 5LNC, O2 saturations 84-85%. Respiratory therapy at beside, preparing to place NRB.  Initial Focused Assessment: Neuro: Patient alert and oriented x 4, pupils 2+, round, equal and reactive light bilaterally. Patient able to follow commands purposefully with all extremities.  Cardiac: S1 and S2 heart sounds auscultated, ST-HR 130s-140s, BP HTN see vital signs.  Pulmonary: breath sounds clear and diminished on right side, breath sounds on left side clear/diminished but with expiratory wheezing. Patient utilizing accessory muscles due to respiratory distress.  Interventions MD Amin paged and NRB placed by Respiratory Therapy  CXR already completed, ABG ordered MD Amin at bedside, ordered to give 60mg  IV Solumedrol STAT. Transfer to ICU/SDU for BiPAP.   Plan of Care (if not transferred):

## 2017-06-08 NOTE — Progress Notes (Signed)
LE venous duplex prelim: negative for DVT. Anderson Coppock Eunice, RDMS, RVT  

## 2017-06-08 NOTE — Consult Note (Addendum)
PULMONARY / CRITICAL CARE MEDICINE   Name: Mark Howell MRN: [id number] DOB: 1941/01/19    ADMISSION DATE:  06/06/2017 CONSULTATION DATE:  06/08/17  REFERRING MD:  Jeanie Sewer MD  CHIEF COMPLAINT:  Acute resp distress  HISTORY OF PRESENT ILLNESS:   77 year old with metastatic non-small cell lung cancer, psoriatic arthritis on chronic prednisone.  Had a recent admission from Jan 9- Jan 12 due to RSV infection.  Evaluated in the oncology clinic on January 29 due to wheezing and given a prednisone taper.  Seen again in the clinic on 2/6 with dehydration he was given fluids and admitted to hosptial.  He continued to receive fluids through the admission.  On 2/8 he became febrile, increasingly dyspneic, sats of 76% on 5 L, tachycardia with heart rate in the 120s.  He was given 60 mg of Solu-Medrol and transferred to stepdown for BiPAP  PAST MEDICAL HISTORY :  He  has a past medical history of Arthritis, Complication of anesthesia, Full dentures, GERD (gastroesophageal reflux disease), History of radiation therapy (01/25/16-02/28/16), History of radiation therapy (04/17/16-05/02/16), HOH (hard of hearing), HTN (hypertension), Hyperlipidemia, Lung mass (12/30/2015), Odynophagia (04/06/2016), Psoriatic arthritis ([place]), Snores, and Wears glasses.  PAST SURGICAL HISTORY: He  has a past surgical history that includes Inguinal hernia repair (01/04/2012); Colonoscopy; Hemorrhoid surgery (N/A, 10/01/2013); Video bronchoscopy with endobronchial ultrasound (N/A, 12/31/2015); IR FLUORO GUIDE PORT INSERTION RIGHT (01/02/2017); and IR US Guide Vasc Access Right (01/02/2017).  Allergies  Allergen Reactions  . Hydrocodone Rash  . Oxycodone Rash    No current facility-administered medications on file prior to encounter.    Current Outpatient Medications on File Prior to Encounter  Medication Sig  . aspirin EC 81 MG tablet Take 81 mg by mouth at bedtime.   . calcium carbonate (TUMS - DOSED IN MG ELEMENTAL CALCIUM) 500 MG  chewable tablet Chew 1 tablet by mouth daily.  . Cetirizine HCl 10 MG TBDP Take 1 tablet by mouth daily.   . Coenzyme Q10 (CO Q 10 PO) Take 1 tablet by mouth daily.  Marland Kitchen dexamethasone (DECADRON) 4 MG tablet 4 mg by mouth twice a day the day before, day of and day after chemotherapy every 3 weeks.  Marland Kitchen Dextromethorphan-Guaifenesin (DELSYM COUGH/CHEST CONGEST DM PO) Take 30 mLs by mouth daily as needed (cold symptoms).  . diphenhydramine-acetaminophen (TYLENOL PM) 25-500 MG TABS tablet Take 1 tablet by mouth at bedtime as needed (sleep).  . diphenoxylate-atropine (LOMOTIL) 2.5-0.025 MG tablet Take 2 tablets by mouth 4 (four) times daily as needed for diarrhea or loose stools.  . fluticasone-salmeterol (ADVAIR HFA) 115-21 MCG/ACT inhaler Inhale 2 puffs into the lungs 2 (two) times daily.  . folic acid (FOLVITE) 1 MG tablet Take 1 tablet (1 mg total) by mouth daily.  Marland Kitchen guaiFENesin (MUCINEX) 600 MG 12 hr tablet Take 1 tablet (600 mg total) by mouth 2 (two) times daily.  Marland Kitchen lidocaine (XYLOCAINE) 2 % solution Use as directed 20 mLs in the mouth or throat every 3 (three) hours as needed for mouth pain.  Marland Kitchen lidocaine-prilocaine (EMLA) cream Apply 1 application topically as needed. (Patient taking differently: Apply 1 application topically as needed (port access). )  . LORazepam (ATIVAN) 0.5 MG tablet Take 1 tablet (0.5 mg total) by mouth 2 (two) times daily as needed for anxiety.  . Lutein 10 MG TABS Take 10 mg by mouth at bedtime.   . magic mouthwash SOLN Take 5 mLs by mouth 4 (four) times daily as needed for mouth pain. (  Patient taking differently: Take 5 mLs by mouth 4 (four) times daily as needed for mouth pain. SWISH AND SPIT OUT)  . Methylcobalamin (METHYL B-12 PO) Take 1 tablet by mouth daily.  . metoprolol tartrate (LOPRESSOR) 25 MG tablet Take 0.5 tablets (12.5 mg total) by mouth 2 (two) times daily.  . Multiple Vitamin (MULITIVITAMIN WITH MINERALS) TABS Take 1 tablet by mouth daily.  . pantoprazole  (PROTONIX) 40 MG tablet Take 40 mg by mouth daily.   . pravastatin (PRAVACHOL) 40 MG tablet Take 40 mg by mouth at bedtime.   . tamsulosin (FLOMAX) 0.4 MG CAPS capsule TAKE (1) CAPSULE DAILY, START 4 DAYS BEFORE PROCEDURE (Patient taking differently: take 1 capsule by mouth every morning)  . triamcinolone (NASACORT) 55 MCG/ACT AERO nasal inhaler Place 1 spray into the nose at bedtime.   Marland Kitchen albuterol (VENTOLIN HFA) 108 (90 Base) MCG/ACT inhaler Inhale 2 puffs into the lungs every 6 (six) hours as needed for wheezing or shortness of breath.   . loperamide (IMODIUM A-D) 2 MG tablet Take 2 mg by mouth daily as needed for diarrhea or loose stools.  . prochlorperazine (COMPAZINE) 10 MG tablet Take 1 tablet (10 mg total) by mouth every 6 (six) hours as needed for nausea or vomiting.    FAMILY HISTORY:  His indicated that his mother is deceased. He indicated that his father is deceased. He indicated that the status of his brother is unknown.   SOCIAL HISTORY: He  reports that he quit smoking about 18 months ago. His smoking use included cigarettes. He has a 25.00 pack-year smoking history. he has never used smokeless tobacco. He reports that he drinks alcohol. He reports that he does not use drugs.  REVIEW OF SYSTEMS:   Unable to obtain  SUBJECTIVE:   VITAL SIGNS: BP (!) 182/103 (BP Location: Right Arm)   Pulse (!) 131   Temp 98.8 F (37.1 C) (Axillary)   Resp (!) 34   Ht 6\' 1"  (1.854 m)   Wt 166 lb 7.2 oz (75.5 kg)   SpO2 (!) 76%   BMI 21.96 kg/m   HEMODYNAMICS:    VENTILATOR SETTINGS:    INTAKE / OUTPUT: I/O last 3 completed shifts: In: 4638.8 [P.O.:540; I.V.:1498.8; IV Piggyback:2600] Out: 2100 [Urine:2100]  PHYSICAL EXAMINATION: Gen:      No acute distress, moderate distress HEENT:  EOMI, sclera anicteric Neck:     No masses; no thyromegaly Lungs:    B/L rhonchi, scattered wheeze CV:         Regular rate and rhythm; no murmurs Abd:      + bowel sounds; soft, non-tender;  no palpable masses, no distension Ext:    No edema; adequate peripheral perfusion Skin:      Warm and dry; no rash Neuro: Mild confusion. No focal deficits  LABS:  BMET Recent Labs  Lab 06/06/17 1117 06/06/17 2139 06/07/17 0607 06/08/17 0418  NA 130*  --  133* 132*  K 4.3  --  4.1 3.9  CL 96*  --  102 103  CO2 19*  --  20* 19*  BUN 31*  --  21* 18  CREATININE 1.36* 1.20 1.08 1.19  GLUCOSE 212*  --  143* 137*    Electrolytes Recent Labs  Lab 06/06/17 1117 06/07/17 0607 06/08/17 0418  CALCIUM 9.0 7.7* 6.9*  MG  --   --  0.6*    CBC Recent Labs  Lab 06/06/17 2139 06/07/17 0607 06/08/17 0418  WBC 7.9 7.1 6.1  HGB 9.5* 9.1* 8.1*  HCT 28.5* 26.6* 23.6*  PLT 111* 100* 84*    Coag's No results for input(s): APTT, INR in the last 168 hours.  Sepsis Markers Recent Labs  Lab 06/06/17 1521 06/06/17 1737  LATICACIDVEN 1.44 1.14    ABG No results for input(s): PHART, PCO2ART, PO2ART in the last 168 hours.  Liver Enzymes Recent Labs  Lab 06/06/17 1117  AST 27  ALT 17  ALKPHOS 97  BILITOT 0.7  ALBUMIN 3.2*    Cardiac Enzymes No results for input(s): TROPONINI, PROBNP in the last 168 hours.  Glucose Recent Labs  Lab 06/07/17 0800 06/07/17 1139 06/07/17 1728 06/07/17 2059 06/08/17 0735 06/08/17 0911  GLUCAP 142* 132* 126* 131* 128* 127*    Imaging Dg Chest Port 1 View  Result Date: 06/08/2017 CLINICAL DATA:  Followup for dyspnea. History of metastatic non small cell lung carcinoma. EXAM: PORTABLE CHEST 1 VIEW COMPARISON:  Chest radiograph, 06/06/2017.  Chest CT, 05/09/2017. FINDINGS: Right hilar region opacity appears larger than on the recent prior chest radiograph. Some of this is likely due to the semi-erect technique and lower lung volumes. There are irregularly thickened interstitial markings, which extend peripherally from the right hilar masslike opacity and are noted throughout most of the left lung. These have increased when compared to  the prior chest radiograph. No pleural effusion.  No pneumothorax. Right anterior chest wall Port-A-Cath is stable. Heart is normal in size. IMPRESSION: 1. Right hilar region masslike opacity appears increased in size from prior study although some of this may be due to differences in technique and patient positioning. Suspect evolving post radiation change. 2. Interstitial thickening has increased from the prior exam, particularly evident in the left lung. Suspect that this is inflammatory in etiology, related to lung carcinoma treatment. Electronically Signed   By: Lajean Manes M.D.   On: 06/08/2017 09:52  I have reviewed the images personally  STUDIES:    CULTURES: Bcx 2/6 > pending Flu PCR 2/8 > negative  ANTIBIOTICS: Vanco 2/8 >> Zosyn 2/8 >>  SIGNIFICANT EVENTS:   LINES/TUBES:   DISCUSSION: 77 year old with metastatic lung cancer, emphysema noted with dehydration Transferred to stepdown for respiratory failure Suspected volume overload as chest x-ray shows diffuse interstitial opacities.  He is over 3 L positive since admission.  Given lasix once. We will also treat for COPD exacerbation, HCAP with steroids and antibiotics. Concern for PE given the positive d dimer  ASSESSMENT / PLAN:  PULMONARY A: Acute resp failure Metastatic lung cancer Emphysema P:   Broad abx coverage Bipap, ABG. Follow CXR Monitor for intubation need Continue nebs CTA when stable. Check LE dopplers Agree with starting lovenox for full anticoagulation Resp virus panel  CARDIOVASCULAR A:  ? Volume overload Tachycardia P:  Lasix 40 mg IV once Echocardiogram, follow troponins  RENAL A:   AKI > improved since admission. P:   Follow urine output and Cr  GASTROINTESTINAL A:   No acute issues P:   Keep NPO while on Bipap PPI  HEMATOLOGIC A:   Thrombocytopenia P:  Monitor CBC  INFECTIOUS A:   Concern for HCAP P:   Started on Vanco, Zosyn Follow cultures,  Pct  ENDOCRINE A:   DM P:   SSI coverage.   NEUROLOGIC A:   Metabolic encephalopathy P:   Monitor mental status  FAMILY  - Updates: Family updated at bedside - Inter-disciplinary family meet or Palliative Care meeting due by:  06/15/17  The patient is critically ill with  multiple organ system failure and requires high complexity decision making for assessment and support, frequent evaluation and titration of therapies, advanced monitoring, review of radiographic studies and interpretation of complex data.   Critical Care Time devoted to patient care services, exclusive of separately billable procedures, described in this note is 35 minutes.   Marshell Garfinkel MD [facility] Pager [phone] If no answer or after 3pm call: [phone] 06/08/2017, 10:48 AM

## 2017-06-08 NOTE — Progress Notes (Signed)
PT Cancellation Note  Patient Details Name: Mark Howell MRN: [id number] DOB: 1940/09/13   Cancelled Treatment:    Reason Eval/Treat Not Completed: Medical issues which prohibited therapy--rapid response called. transferred to icu. Will hold PT and check back another day.    Weston Anna, MPT Pager: [phone]

## 2017-06-08 NOTE — Progress Notes (Signed)
2340: temp 102.9 resp 40 Diminished breath sounds/rhonchi. pulse 119.  Tylenol 650/motrin 400 mg, 1/2 liter NS adm    0045: Temp 100.5 resp 40 breath sounds diminished/rhonchi pulse 117  1/2 liter NS,xopenex adm                                                                             0530: resp 28 diminished/rhonchi pulse 98

## 2017-06-08 NOTE — Progress Notes (Signed)
OT Cancellation Note  Patient Details Name: KAMIN NIBLACK MRN: [id number] DOB: [birth year]   Cancelled Treatment:    Reason Eval/Treat Not Completed: Medical issues which prohibited therapy.  Pt with respiratory distress this am, and transferred to step down.  Will reattempt as medically appropriate.  Kaniesha Barile [place], OTR/L [phone]   Lucille Passy M 06/08/2017, 10:44 AM

## 2017-06-08 NOTE — Progress Notes (Signed)
CRITICAL VALUE ALERT  Critical Value:  Mag 0.6  Date & Time Notied:  0516  Provider Notified: yes  Orders Received/Actions taken: mag 4gm

## 2017-06-08 NOTE — Progress Notes (Signed)
Inpatient Diabetes Program Recommendations  AACE/ADA: New Consensus Statement on Inpatient Glycemic Control (2015)  Target Ranges:  Prepandial:   less than 140 mg/dL      Peak postprandial:   less than 180 mg/dL (1-2 hours)      Critically ill patients:  140 - 180 mg/dL   Lab Results  Component Value Date   GLUCAP 127 (H) 06/08/2017   HGBA1C 7.4 (H) 06/06/2017    Went to follow up with patient and see if wife was in room for DM education. Unit Secretary report, patient was transferred for resp distress to ICU. Will try another time.  Thanks,  Tama Headings RN, MSN, [facility] Inpatient Diabetes Coordinator Team Pager [phone] (8a-5p)

## 2017-06-08 NOTE — Progress Notes (Addendum)
PROGRESS NOTE    Mark Howell  VWU:[id number] DOB: [birth year] DOA: 06/06/2017 PCP: [name], MD   Brief Narrative:  77 year old male with history of psoriatic arthritis on chronic prednisone, metastatic lung cancer on chemotherapy was sent to the hospital due to concerns of polydipsia, polyuria and dehydration.  He was noted to likely from dehydration which has improved.  Patient was diagnosed with new onset diabetes therefore started on glipizide.  His stay was complicated by acute shortness of breath and fever therefore started on antibiotics, placed on BiPAP and had to be diuresed due to positive balance of fluid.  Empirically started on DVT/PE treatment given elevated d-dimer, unable to get CTA as he is on BiPAP.   Assessment & Plan:   Active Problems:   Dehydration  Acute respiratory failure with hypoxia requiring BiPAP, 70% on room air Healthcare acquired pneumonia Elevated d-dimer -Patient will be transferred to stepdown unit for closer monitoring and on BiPAP.  Low threshold for intubation - Continue his IV steroids, nebulizers and BiPAP - Blood culture negative thus far - UA is negative from 2/6 and -Vancomycin and Zosyn ordered -1 dose of IV Lasix given due to positive balance of fluid - Unable to get CTA of the chest at this time but he is at high risk for pulmonary embolism therefore ordered Lovenox 1 mg/kg until be able to get this. -Appreciate pulmonary input   Acute kidney injury, resolved Hyponatremia, at baseline -Provide supportive care  Generalized weakness -Deconditioning along with his comorbidities -Physical therapy recommends home PT.  Skilled nursing facility declined  Diabetes mellitus type 2, new diagnosis -Blood glucose is in acceptable range on glipizide.  Continue this -Continue Accu-Chek and sliding scale -Appreciate diabetes coordinator input  Sinus tachycardia -Continue Lopressor -Secondary to respiratory distress, elevated d-dimer  therefore getting further care- above  COPD -Appears to be stable -Nebulizer treatments as needed  Metastatic non-small lung cell carcinoma -Follows outpatient with oncology, Dr Julien Nordmann.   DVT prophylaxis: Lovenox Code Status: Full Family Communication: Wife at bedside Disposition Plan: Patient transferred to the stepdown unit   Addendum at 245 pm Patient seen and examined again this afternoon.  He appears much better this morning and would like to come off of his BiPAP.  So far he has diuresed almost close to a liter, his diffuse wheezing has resolved with IV steroids nebulizers and diuretics.  Lower extremity Dopplers prelim read is negative for DVT.  While I was in the room patient was taken off of BiPAP and is placed on nasal cannula doing well.  Patient denies any complaints and states his breathing almost feels back to his baseline.  He still remains in slight sinus tachycardia and is also febrile.  He was given as needed Tylenol for the fever.  Family is available at bedside who also agrees patient looks a whole lot better. At this time will plan to transition to nasal cannula and wean off oxygen.  I will still go ahead and order CTA of the chest for later in the evening if he remains on nasal cannula and stable to rule out pulmonary embolism.  In the meantime will closely monitor him in the stepdown unit today. 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    Subjective: Rapid response called this morning as patient was noted to be hypoxic and severely short of breath.  Patient was also febrile overnight. Due to this he was placed on BiPAP and transferred to  the stepdown unit for further care.  Wife is present at the bedside during my evaluation and I spoke with her extensively.  After placing the patient on BiPAP he stated he feels much more comfortable.  Objective: Vitals:   06/08/17 0143 06/08/17 0538 06/08/17 0859 06/08/17  0912  BP:  101/70 (!) 168/65 (!) 182/103  Pulse:  98 (!) 126 (!) 131  Resp:  (!) 28 (!) 34   Temp:  97.9 F (36.6 C) 97.9 F (36.6 C) 98.8 F (37.1 C)  TempSrc:  Oral Oral Axillary  SpO2: 92% 94% (!) 76%   Weight:      Height:        Intake/Output Summary (Last 24 hours) at 06/08/2017 1118 Last data filed at 06/08/2017 1040 Gross per 24 hour  Intake 3586.25 ml  Output 1900 ml  Net 1686.25 ml   Filed Weights   06/06/17 1439 06/06/17 2138  Weight: 73.9 kg (163 lb) 75.5 kg (166 lb 7.2 oz)    Examination:  General exam: Appears in moderate distress, feels a little better after being placed on BiPAP. respiratory system: Diffuse wheezing noted Cardiovascular system: Sinus tachycardia, S1 & S2 heard, RRR. No JVD, murmurs, rubs, gallops or clicks. No pedal edema. Gastrointestinal system: Abdomen is nondistended, soft and nontender. No organomegaly or masses felt. Normal bowel sounds heard. Central nervous system: Alert and oriented. No focal neurological deficits. Extremities: Symmetric 5 x 5 power. Skin: No rashes, lesions or ulcers Psychiatry: Judgement and insight appear normal. Mood & affect appropriate.     Data Reviewed:   CBC: Recent Labs  Lab 06/06/17 1117 06/06/17 2139 06/07/17 0607 06/08/17 0418  WBC 10.2 7.9 7.1 6.1  NEUTROABS 7.6*  --  5.4  --   HGB 11.1* 9.5* 9.1* 8.1*  HCT 33.2* 28.5* 26.6* 23.6*  MCV 97.9 96.9 95.3 94.8  PLT 158 111* 100* 84*   Basic Metabolic Panel: Recent Labs  Lab 06/06/17 1117 06/06/17 2139 06/07/17 0607 06/08/17 0418  NA 130*  --  133* 132*  K 4.3  --  4.1 3.9  CL 96*  --  102 103  CO2 19*  --  20* 19*  GLUCOSE 212*  --  143* 137*  BUN 31*  --  21* 18  CREATININE 1.36* 1.20 1.08 1.19  CALCIUM 9.0  --  7.7* 6.9*  MG  --   --   --  0.6*   GFR: Estimated Creatinine Clearance: 55.5 mL/min (by C-G formula based on SCr of 1.19 mg/dL). Liver Function Tests: Recent Labs  Lab 06/06/17 1117  AST 27  ALT 17  ALKPHOS 97    BILITOT 0.7  PROT 7.8  ALBUMIN 3.2*   No results for input(s): LIPASE, AMYLASE in the last 168 hours. No results for input(s): AMMONIA in the last 168 hours. Coagulation Profile: No results for input(s): INR, PROTIME in the last 168 hours. Cardiac Enzymes: No results for input(s): CKTOTAL, CKMB, CKMBINDEX, TROPONINI in the last 168 hours. BNP (last 3 results) No results for input(s): PROBNP in the last 8760 hours. HbA1C: Recent Labs    06/06/17 1903  HGBA1C 7.4*   CBG: Recent Labs  Lab 06/07/17 1139 06/07/17 1728 06/07/17 2059 06/08/17 0735 06/08/17 0911  GLUCAP 132* 126* 131* 128* 127*   Lipid Profile: No results for input(s): CHOL, HDL, LDLCALC, TRIG, CHOLHDL, LDLDIRECT in the last 72 hours. Thyroid Function Tests: No results for input(s): TSH, T4TOTAL, FREET4, T3FREE, THYROIDAB in the last 72 hours. Anemia Panel: No results  for input(s): VITAMINB12, FOLATE, FERRITIN, TIBC, IRON, RETICCTPCT in the last 72 hours. Sepsis Labs: Recent Labs  Lab 06/06/17 1521 06/06/17 1737  LATICACIDVEN 1.44 1.14    Recent Results (from the past 240 hour(s))  Urine culture     Status: None   Collection Time: 06/06/17  3:09 PM  Result Value Ref Range Status   Specimen Description   Final    URINE, CLEAN CATCH Performed at [facility] [address], [place], [place] [address]    Special Requests   Final    Normal Performed at [facility], [place] [address] [place], [place] [address]    Culture   Final    NO GROWTH Performed at [facility], [place] [address] [place], [place] [address]    Report Status 06/07/2017 FINAL  Final  Blood culture (routine x 2)     Status: None (Preliminary result)   Collection Time: 06/06/17  3:10 PM  Result Value Ref Range Status   Specimen Description   Final    BLOOD RIGHT PORTA CATH Performed at [facility] [address], [place], [place] [address]    Special Requests    Final    BOTTLES DRAWN AEROBIC AND ANAEROBIC Blood Culture adequate volume Performed at [facility] [address], [place], [place] [address]    Culture   Final    NO GROWTH < 24 HOURS Performed at [facility] [address], [place], [place] [address]    Report Status PENDING  Incomplete  Blood culture (routine x 2)     Status: None (Preliminary result)   Collection Time: 06/06/17  5:25 PM  Result Value Ref Range Status   Specimen Description   Final    BLOOD LEFT ANTECUBITAL Performed at [facility] [address], [place], [place] [address]    Special Requests   Final    BOTTLES DRAWN AEROBIC AND ANAEROBIC Blood Culture adequate volume Performed at [facility] [address], [place], [place] [address]    Culture   Final    NO GROWTH < 24 HOURS Performed at [facility] [address] [place], [place] [address]    Report Status PENDING  Incomplete  MRSA PCR Screening     Status: Abnormal   Collection Time: 06/06/17  9:16 PM  Result Value Ref Range Status   MRSA by PCR POSITIVE (A) NEGATIVE Final    Comment:        The GeneXpert MRSA Assay (FDA approved for NASAL specimens only), is one component of a comprehensive MRSA colonization surveillance program. It is not intended to diagnose MRSA infection nor to guide or monitor treatment for MRSA infections. RESULT CALLED TO, READ BACK BY AND VERIFIED WITH: Monia Pouch RN [id number] 06/06/17 A NAVARRO Performed at [facility], [place] [address] [place], [place] [address]          Radiology Studies: Dg Chest 2 View  Result Date: 06/06/2017 CLINICAL DATA:  Known lung carcinoma with shortness of Breath EXAM: CHEST  2 VIEW COMPARISON:  05/09/2017 FINDINGS: Cardiac shadow is within normal limits. Aortic calcifications are again seen. Right chest wall port is noted in satisfactory position. Fullness in the right hilar region is again seen  slightly less than that seen on the prior exam consistent with post radiation change. No acute infiltrate is seen. Some scarring is noted in the left lung. No acute  bony abnormality is noted. IMPRESSION: No significant change from the prior exam. Post radiation changes on the right are noted. Electronically Signed   By: Inez Catalina M.D.   On: 06/06/2017 14:10   Dg Chest Port 1 View  Result Date: 06/08/2017 CLINICAL DATA:  Followup for dyspnea. History of metastatic non small cell lung carcinoma. EXAM: PORTABLE CHEST 1 VIEW COMPARISON:  Chest radiograph, 06/06/2017.  Chest CT, 05/09/2017. FINDINGS: Right hilar region opacity appears larger than on the recent prior chest radiograph. Some of this is likely due to the semi-erect technique and lower lung volumes. There are irregularly thickened interstitial markings, which extend peripherally from the right hilar masslike opacity and are noted throughout most of the left lung. These have increased when compared to the prior chest radiograph. No pleural effusion.  No pneumothorax. Right anterior chest wall Port-A-Cath is stable. Heart is normal in size. IMPRESSION: 1. Right hilar region masslike opacity appears increased in size from prior study although some of this may be due to differences in technique and patient positioning. Suspect evolving post radiation change. 2. Interstitial thickening has increased from the prior exam, particularly evident in the left lung. Suspect that this is inflammatory in etiology, related to lung carcinoma treatment. Electronically Signed   By: Lajean Manes M.D.   On: 06/08/2017 09:52        Scheduled Meds: . aspirin EC  81 mg Oral QHS  . calcium carbonate  1 tablet Oral Daily  . Chlorhexidine Gluconate Cloth  6 each Topical Q0600  . enoxaparin (LOVENOX) injection  1 mg/kg Subcutaneous Q12H  . folic acid  1 mg Oral Daily  . glipiZIDE  5 mg Oral QAC breakfast  . guaiFENesin  600 mg Oral BID  . insulin aspart  0-15 Units  Subcutaneous TID WC  . ipratropium  0.5 mg Nebulization BID  . levalbuterol  1.25 mg Nebulization BID  . loratadine  10 mg Oral Daily  . methylPREDNISolone sodium succinate      . methylPREDNISolone (SOLU-MEDROL) injection  40 mg Intravenous Q6H  . metoprolol tartrate  12.5 mg Oral BID  . mometasone-formoterol  2 puff Inhalation BID  . multivitamin with minerals  1 tablet Oral Daily  . mupirocin ointment  1 application Nasal BID  . pantoprazole  40 mg Oral Daily  . pravastatin  40 mg Oral QHS  . sodium bicarbonate  650 mg Oral TID  . tamsulosin  0.4 mg Oral q morning - 10a  . triamcinolone  1 spray Nasal QHS   Continuous Infusions: . piperacillin-tazobactam (ZOSYN)  IV    . sodium chloride    . vancomycin 1,500 mg (06/08/17 1014)  . [START ON 06/09/2017] vancomycin       LOS: 1 day    Time spent: 35 mins    Angelicia Lessner Arsenio Loader, MD [facility] Pager [phone]   If 7PM-7AM, please contact night-coverage [email or web address] Password [email or web address] 06/08/2017, 11:18 AM

## 2017-06-09 ENCOUNTER — Inpatient Hospital Stay (HOSPITAL_COMMUNITY): Payer: Medicare Other

## 2017-06-09 DIAGNOSIS — J440 Chronic obstructive pulmonary disease with acute lower respiratory infection: Secondary | ICD-10-CM

## 2017-06-09 DIAGNOSIS — J209 Acute bronchitis, unspecified: Secondary | ICD-10-CM

## 2017-06-09 DIAGNOSIS — J189 Pneumonia, unspecified organism: Secondary | ICD-10-CM

## 2017-06-09 DIAGNOSIS — I361 Nonrheumatic tricuspid (valve) insufficiency: Secondary | ICD-10-CM

## 2017-06-09 LAB — GLUCOSE, CAPILLARY
GLUCOSE-CAPILLARY: 228 mg/dL — AB (ref 65–99)
GLUCOSE-CAPILLARY: 353 mg/dL — AB (ref 65–99)
GLUCOSE-CAPILLARY: 315 mg/dL — AB (ref 65–99)
GLUCOSE-CAPILLARY: 174 mg/dL — AB (ref 65–99)
Glucose-Capillary: 175 mg/dL — ABNORMAL HIGH (ref 65–99)
Glucose-Capillary: 170 mg/dL — ABNORMAL HIGH (ref 65–99)

## 2017-06-09 LAB — BASIC METABOLIC PANEL
ANION GAP: 13 (ref 5–15)
BUN: 29 mg/dL — AB (ref 6–20)
CO2: 20 mmol/L — ABNORMAL LOW (ref 22–32)
Calcium: 7.5 mg/dL — ABNORMAL LOW (ref 8.9–10.3)
Chloride: 102 mmol/L (ref 101–111)
Creatinine, Ser: 1.33 mg/dL — ABNORMAL HIGH (ref 0.61–1.24)
GFR, EST AFRICAN AMERICAN: 58 mL/min — AB (ref >60)
GFR, EST NON AFRICAN AMERICAN: 50 mL/min — AB (ref >60)
Glucose, Bld: 208 mg/dL — ABNORMAL HIGH (ref 65–99)
Potassium: 3.9 mmol/L (ref 3.5–5.1)
SODIUM: 135 mmol/L (ref 135–145)

## 2017-06-09 LAB — ECHOCARDIOGRAM COMPLETE
HEIGHTINCHES: 73 in
Weight: 2663.16 oz

## 2017-06-09 LAB — CBC
HCT: 23.6 % — ABNORMAL LOW (ref 39.0–52.0)
Hemoglobin: 8 g/dL — ABNORMAL LOW (ref 13.0–17.0)
MCH: 31.5 pg (ref 26.0–34.0)
MCHC: 33.9 g/dL (ref 30.0–36.0)
MCV: 92.9 fL (ref 78.0–100.0)
Platelets: 84 10*3/uL — ABNORMAL LOW (ref 150–400)
RBC: 2.54 MIL/uL — AB (ref 4.22–5.81)
RDW: 18.2 % — AB (ref 11.5–15.5)
WBC: 4.4 10*3/uL (ref 4.0–10.5)

## 2017-06-09 LAB — URINE CULTURE: Culture: <10000 — AB

## 2017-06-09 LAB — TROPONIN I

## 2017-06-09 LAB — MAGNESIUM: Magnesium: 1.9 mg/dL (ref 1.7–2.4)

## 2017-06-09 MED ORDER — CHLORHEXIDINE GLUCONATE CLOTH 2 % EX PADS
6.0000 | MEDICATED_PAD | Freq: Every day | CUTANEOUS | Status: AC
  Administered 2017-06-09 – 2017-06-11 (×3): 6 via TOPICAL

## 2017-06-09 MED ORDER — MIDAZOLAM HCL 2 MG/2ML IJ SOLN
INTRAMUSCULAR | Status: AC
  Filled 2017-06-09: qty 6

## 2017-06-09 MED ORDER — METHYLPREDNISOLONE SODIUM SUCC 40 MG IJ SOLR
40.0000 mg | Freq: Two times a day (BID) | INTRAMUSCULAR | Status: DC
  Administered 2017-06-09 – 2017-06-12 (×6): 40 mg via INTRAVENOUS
  Filled 2017-06-09 (×6): qty 1

## 2017-06-09 MED ORDER — FENTANYL CITRATE (PF) 100 MCG/2ML IJ SOLN
INTRAMUSCULAR | Status: AC
  Filled 2017-06-09: qty 2

## 2017-06-09 NOTE — Progress Notes (Signed)
  Echocardiogram 2D Echocardiogram has been performed.  Jennette Dubin 06/09/2017, 12:01 PM

## 2017-06-09 NOTE — Progress Notes (Signed)
PCCM Progress Note  Subjective: Breathing better.  Denies chest pain.  Transitioned to nasal cannula.  Vital signs: BP 92/63   Pulse 94   Temp (!) 96.8 F (36 C)   Resp (!) 30   Ht 6\' 1"  (1.854 m)   Wt 166 lb 7.2 oz (75.5 kg)   SpO2 98%   BMI 21.96 kg/m   Intake/output: I/O last 3 completed shifts: In: 2180 [P.O.:480; IV Piggyback:1700] Out: 1625 [Urine:1625]  Physical Exam: General - pleasant Eyes - pupils reactive ENT - no sinus tenderness, no oral exudate, no LAN Cardiac - regular, no murmur Chest - no wheeze, rales Abd - soft, non tender Ext - no edema Skin - no rashes Neuro - normal strength Psych - normal mood  Labs: CMP Latest Ref Rng & Units 06/09/2017 06/08/2017 06/07/2017  Glucose 65 - 99 mg/dL 208(H) 137(H) 143(H)  BUN 6 - 20 mg/dL 29(H) 18 21(H)  Creatinine 0.61 - 1.24 mg/dL 1.33(H) 1.19 1.08  Sodium 135 - 145 mmol/L 135 132(L) 133(L)  Potassium 3.5 - 5.1 mmol/L 3.9 3.9 4.1  Chloride 101 - 111 mmol/L 102 103 102  CO2 22 - 32 mmol/L 20(L) 19(L) 20(L)  Calcium 8.9 - 10.3 mg/dL 7.5(L) 6.9(L) 7.7(L)  Total Protein 6.4 - 8.3 g/dL - - -  Total Bilirubin 0.2 - 1.2 mg/dL - - -  Alkaline Phos 40 - 150 U/L - - -  AST 5 - 34 U/L - - -  ALT 0 - 55 U/L - - -    CBC Latest Ref Rng & Units 06/09/2017 06/08/2017 06/07/2017  WBC 4.0 - 10.5 K/uL 4.4 6.1 7.1  Hemoglobin 13.0 - 17.0 g/dL 8.0(L) 8.1(L) 9.1(L)  Hematocrit 39.0 - 52.0 % 23.6(L) 23.6(L) 26.6(L)  Platelets 150 - 400 K/uL 84(L) 84(L) 100(L)    ABG    Component Value Date/Time   PHART 7.520 (H) 06/08/2017 1055   PCO2ART 20.4 (L) 06/08/2017 1055   PO2ART 445 (H) 06/08/2017 1055   HCO3 16.6 (L) 06/08/2017 1055   ACIDBASEDEF 4.7 (H) 06/08/2017 1055   O2SAT 49.2 05/09/2017 1248    CBG (last 3)  Recent Labs    06/08/17 2330 06/09/17 0330 06/09/17 0751  GLUCAP 209* 228* 175*    Ct Angio Chest Pe W Or Wo Contrast  Result Date: 06/08/2017 CLINICAL DATA:  77 year old male with history of psoriatic arthritis  on chronic prednisone, metastatic lung cancer on chemotherapy was sent to the hospital due to concerns of polydipsia, polyuria and dehydration. He was noted to likely from dehydration which has improved. Patient was diagnosed with new onset diabetes therefore started on glipizide. His stay was complicated by acute shortness of breath and fever therefore started on antibiotics, placed on BiPAP and had to be diuresed due to positive balance of fluid. Empirically started on DVT/PE treatment given elevated d-dimer, unable to get CTA as he is on BiPAP. EXAM: CT ANGIOGRAPHY CHEST WITH CONTRAST TECHNIQUE: Multidetector CT imaging of the chest was performed using the standard protocol during bolus administration of intravenous contrast. Multiplanar CT image reconstructions and MIPs were obtained to evaluate the vascular anatomy. CONTRAST:  144mL ISOVUE-370 IOPAMIDOL (ISOVUE-370) INJECTION 76% COMPARISON:  Current chest radiograph.  Chest CTA, 05/09/2017. FINDINGS: Cardiovascular: Satisfactory opacification of the pulmonary arteries to the segmental level. Study is degraded by motion. Allowing for the motion retention, there is no evidence of a pulmonary embolism. Heart is normal in size and configuration. No pericardial effusion. There are mild coronary artery calcifications. Thoracic aorta  is normal in caliber. No dissection. Mild atherosclerotic changes along the arch and descending portion. Mediastinum/Nodes: Normal thyroid gland. No neck base or axillary masses or adenopathy. No mediastinal masses or pathologically enlarged lymph nodes. No left hilar masses or adenopathy. Abnormal soft tissue surrounds the right hilar structures similar to the prior CT, likely radiation induced fibrosis. Tumor is possible. Trachea is widely patent. Mainstem bronchi are widely patent. Esophagus is unremarkable. Lungs/Pleura: Since the prior CT, ground-glass opacities have developed throughout much of the left lower lobe and throughout  the posterior inferior aspect of the left upper lobe. Ground-glass opacities have also developed/increased in a patchy distribution throughout right upper lobe. More confluent type opacities extending anteriorly and superiorly from the soft tissue surrounding the right hilum has increased. New, minimal, right greater than left, pleural effusions. Subpleural 7 mm nodular opacity noted in the inferior right upper lobe, image 72, series 6, mildly increased in size the prior study. Small linear and nodular abnormality in the lateral left upper lobe, nodule centered on image 62 measuring 7 mm, new since the prior exam. Stable changes of centrilobular emphysema. No pneumothorax. Upper Abdomen: No acute findings.  Bilateral adrenal masses. Musculoskeletal: No fracture or acute finding. No osteoblastic or osteolytic lesions. Review of the MIP images confirms the above findings. IMPRESSION: 1. No evidence of a pulmonary embolism. 2. Development of new bilateral areas of ground-glass opacity, that may reflect multifocal pneumonia or treatment related pneumonitis. 3. Right hilar region soft tissue is similar to the prior study, likely radiation induced scarring. Active/viable tumor as a component is possible. 4. Trace, right greater than left, pleural effusions, also new since the prior study. Aortic Atherosclerosis (ICD10-I70.0) and Emphysema (ICD10-J43.9). Electronically Signed   By: Lajean Manes M.D.   On: 06/08/2017 16:22   Dg Chest Port 1 View  Result Date: 06/08/2017 CLINICAL DATA:  Followup for dyspnea. History of metastatic non small cell lung carcinoma. EXAM: PORTABLE CHEST 1 VIEW COMPARISON:  Chest radiograph, 06/06/2017.  Chest CT, 05/09/2017. FINDINGS: Right hilar region opacity appears larger than on the recent prior chest radiograph. Some of this is likely due to the semi-erect technique and lower lung volumes. There are irregularly thickened interstitial markings, which extend peripherally from the right  hilar masslike opacity and are noted throughout most of the left lung. These have increased when compared to the prior chest radiograph. No pleural effusion.  No pneumothorax. Right anterior chest wall Port-A-Cath is stable. Heart is normal in size. IMPRESSION: 1. Right hilar region masslike opacity appears increased in size from prior study although some of this may be due to differences in technique and patient positioning. Suspect evolving post radiation change. 2. Interstitial thickening has increased from the prior exam, particularly evident in the left lung. Suspect that this is inflammatory in etiology, related to lung carcinoma treatment. Electronically Signed   By: Lajean Manes M.D.   On: 06/08/2017 09:52    Assessment/plan:  Acute hypoxic respiratory failure. - most likely from acute pulmonary edema - improved with lasix - oxygen to keep SpO2 90 to 95% - defer Abx to primary team - can d/c Bipap - wean off steroids as able - scheduled BDs  Updated pt's wife at bedside  PCCM will sign off.  Please call if additional help needed while he is in hospital.  Chesley Mires, MD [place] 06/09/2017, 11:21 AM Pager:  [phone] After 3pm call: [phone]

## 2017-06-09 NOTE — Progress Notes (Signed)
PROGRESS NOTE    Mark Howell  PYP:[id number] DOB: August 27, 1940 DOA: 06/06/2017 PCP: [name], MD   Brief Narrative:  77 year old male with history of psoriatic arthritis on chronic prednisone, metastatic lung cancer on chemotherapy was sent to the hospital due to concerns of polydipsia, polyuria and dehydration.  He was noted to likely from dehydration which has improved.  Patient was diagnosed with new onset diabetes therefore started on glipizide.  His stay was complicated by acute shortness of breath and fever therefore started on antibiotics, placed on BiPAP and had to be diuresed due to positive balance of fluid.  CTA was neg for PE but showed PNA. Started on Vanc and Zosyn.    Assessment & Plan:   Active Problems:   Dehydration  Acute respiratory failure with hypoxia; improved Healthcare acquired pneumonia Elevated d-dimer -Currently he has been transitioned to nasal cannula, plans to wean this down today -CT of the chest is negative for pulmonary embolism but it shows pneumonia - Blood culture negative thus far - UA is negative from 2/6  -Vancomycin and Zosyn day 2 -Appreciate pulmonary input -We will reduce the frequency of Solu-Medrol   Acute kidney injury, resolved Hyponatremia, at baseline -Provide supportive care  Generalized weakness -Deconditioning along with his comorbidities -Physical therapy recommends home PT.  Skilled nursing facility declined  Diabetes mellitus type 2, new diagnosis -Blood glucose is in acceptable range on glipizide.  Continue this -Continue Accu-Chek and sliding scale -Appreciate diabetes coordinator input  Sinus tachycardia -Improved  COPD -Appears to be stable -Nebulizer treatments as needed  Metastatic non-small lung cell carcinoma -Follows outpatient with oncology, Dr Julien Nordmann.   DVT prophylaxis: Lovenox Code Status: Full Family Communication: Wife at bedside Disposition Plan: If we are able to keep him off of BiPAP  today may be later we can transfer him to MedSurg floor   Subjective: Patient has been off of BiPAP for greater than 16 hours now.  Reports his breathing has improved quite a lot.  Objective: Vitals:   06/08/17 1947 06/08/17 2100 06/09/17 0000 06/09/17 0400  BP: (!) 99/55 107/66 91/62 92/63   Pulse: 99 95 94 94  Resp: (!) 30 (!) 24 (!) 33 (!) 30  Temp: 98.2 F (36.8 C) (!) 97.5 F (36.4 C) (!) 97 F (36.1 C) (!) 96.8 F (36 C)  TempSrc:      SpO2: 100% 100% 100% 98%  Weight:      Height:        Intake/Output Summary (Last 24 hours) at 06/09/2017 1050 Last data filed at 06/09/2017 0500 Gross per 24 hour  Intake 50 ml  Output 825 ml  Net -775 ml   Filed Weights   06/06/17 1439 06/06/17 2138  Weight: 73.9 kg (163 lb) 75.5 kg (166 lb 7.2 oz)    Examination:  General exam: Appears much more comfortable, currently on nasal cannula off BiPAP respiratory system: Diminished breath sounds at the bases Cardiovascular system: Sinus tachycardia, S1 & S2 heard, RRR. No JVD, murmurs, rubs, gallops or clicks. No pedal edema. Gastrointestinal system: Abdomen is nondistended, soft and nontender. No organomegaly or masses felt. Normal bowel sounds heard. Central nervous system: Alert and oriented. No focal neurological deficits. Extremities: Symmetric 5 x 5 power. Skin: No rashes, lesions or ulcers Psychiatry: Judgement and insight appear normal. Mood & affect appropriate.     Data Reviewed:   CBC: Recent Labs  Lab 06/06/17 1117 06/06/17 2139 06/07/17 0607 06/08/17 0418 06/09/17 0500  WBC 10.2 7.9 7.1 6.1  4.4  NEUTROABS 7.6*  --  5.4  --   --   HGB 11.1* 9.5* 9.1* 8.1* 8.0*  HCT 33.2* 28.5* 26.6* 23.6* 23.6*  MCV 97.9 96.9 95.3 94.8 92.9  PLT 158 111* 100* 84* 84*   Basic Metabolic Panel: Recent Labs  Lab 06/06/17 1117 06/06/17 2139 06/07/17 0607 06/08/17 0418 06/09/17 0500  NA 130*  --  133* 132* 135  K 4.3  --  4.1 3.9 3.9  CL 96*  --  102 103 102  CO2 19*  --   20* 19* 20*  GLUCOSE 212*  --  143* 137* 208*  BUN 31*  --  21* 18 29*  CREATININE 1.36* 1.20 1.08 1.19 1.33*  CALCIUM 9.0  --  7.7* 6.9* 7.5*  MG  --   --   --  0.6* 1.9   GFR: Estimated Creatinine Clearance: 49.7 mL/min (A) (by C-G formula based on SCr of 1.33 mg/dL (H)). Liver Function Tests: Recent Labs  Lab 06/06/17 1117  AST 27  ALT 17  ALKPHOS 97  BILITOT 0.7  PROT 7.8  ALBUMIN 3.2*   No results for input(s): LIPASE, AMYLASE in the last 168 hours. No results for input(s): AMMONIA in the last 168 hours. Coagulation Profile: No results for input(s): INR, PROTIME in the last 168 hours. Cardiac Enzymes: Recent Labs  Lab 06/08/17 1206 06/08/17 2020 06/09/17 0400  TROPONINI 0.05* 0.03* <0.03   BNP (last 3 results) No results for input(s): PROBNP in the last 8760 hours. HbA1C: Recent Labs    06/06/17 1903  HGBA1C 7.4*   CBG: Recent Labs  Lab 06/08/17 1613 06/08/17 2204 06/08/17 2330 06/09/17 0330 06/09/17 0751  GLUCAP 193* 200* 209* 228* 175*   Lipid Profile: No results for input(s): CHOL, HDL, LDLCALC, TRIG, CHOLHDL, LDLDIRECT in the last 72 hours. Thyroid Function Tests: No results for input(s): TSH, T4TOTAL, FREET4, T3FREE, THYROIDAB in the last 72 hours. Anemia Panel: No results for input(s): VITAMINB12, FOLATE, FERRITIN, TIBC, IRON, RETICCTPCT in the last 72 hours. Sepsis Labs: Recent Labs  Lab 06/06/17 1521 06/06/17 1737 06/08/17 1040 06/08/17 1100  PROCALCITON  --   --  1.02  --   LATICACIDVEN 1.44 1.14  --  2.0*    Recent Results (from the past 240 hour(s))  Urine culture     Status: None   Collection Time: 06/06/17  3:09 PM  Result Value Ref Range Status   Specimen Description   Final    URINE, CLEAN CATCH Performed at [facility] [address], [place], [place] [address]    Special Requests   Final    Normal Performed at [facility], [place] [address] [place], [place] [address]    Culture    Final    NO GROWTH Performed at [facility], [place] [address], [place], [place] [address]    Report Status 06/07/2017 FINAL  Final  Blood culture (routine x 2)     Status: None (Preliminary result)   Collection Time: 06/06/17  3:10 PM  Result Value Ref Range Status   Specimen Description   Final    BLOOD RIGHT PORTA CATH Performed at [facility] [address] [place], [place] [address]    Special Requests   Final    BOTTLES DRAWN AEROBIC AND ANAEROBIC Blood Culture adequate volume Performed at [facility] [address] [place], [place] [address]    Culture   Final    NO  GROWTH 2 DAYS Performed at [facility], [place] [address] [place], [place] [address]    Report Status PENDING  Incomplete  Blood culture (routine x 2)     Status: None (Preliminary result)   Collection Time: 06/06/17  5:25 PM  Result Value Ref Range Status   Specimen Description   Final    BLOOD LEFT ANTECUBITAL Performed at [facility] [address] [place], [place] [address]    Special Requests   Final    BOTTLES DRAWN AEROBIC AND ANAEROBIC Blood Culture adequate volume Performed at [facility] [address] [place], [place] [address]    Culture   Final    NO GROWTH 2 DAYS Performed at [facility] [address], [place], [place] [address]    Report Status PENDING  Incomplete  MRSA PCR Screening     Status: Abnormal   Collection Time: 06/06/17  9:16 PM  Result Value Ref Range Status   MRSA by PCR POSITIVE (A) NEGATIVE Final    Comment:        The GeneXpert MRSA Assay (FDA approved for NASAL specimens only), is one component of a comprehensive MRSA colonization surveillance program. It is not intended to diagnose MRSA infection nor to guide or monitor treatment for MRSA infections. RESULT CALLED TO, READ BACK BY AND VERIFIED WITH: Monia Pouch RN [id number] 06/06/17 A NAVARRO Performed at [facility], [place] [address], [place], [place] [address]   Respiratory Panel by PCR     Status: None   Collection Time: 06/08/17  2:10 PM  Result Value Ref Range Status   Adenovirus NOT DETECTED NOT DETECTED Final   Coronavirus 229E NOT DETECTED NOT DETECTED Final   Coronavirus HKU1 NOT DETECTED NOT DETECTED Final   Coronavirus NL63 NOT DETECTED NOT DETECTED Final   Coronavirus OC43 NOT DETECTED NOT DETECTED Final   Metapneumovirus NOT DETECTED NOT DETECTED Final   Rhinovirus / Enterovirus NOT DETECTED NOT DETECTED Final   Influenza A NOT DETECTED NOT DETECTED Final   Influenza A H1 NOT DETECTED NOT DETECTED Final   Influenza A H1 2009 NOT DETECTED NOT DETECTED Final   Influenza A H3 NOT DETECTED NOT DETECTED Final   Influenza B NOT DETECTED NOT DETECTED Final   Parainfluenza Virus 1 NOT DETECTED NOT DETECTED Final   Parainfluenza Virus 2 NOT DETECTED NOT DETECTED Final   Parainfluenza Virus 3 NOT DETECTED NOT DETECTED Final   Parainfluenza Virus 4 NOT DETECTED NOT DETECTED Final   Respiratory Syncytial Virus NOT DETECTED NOT DETECTED Final   Bordetella pertussis NOT DETECTED NOT DETECTED Final   Chlamydophila pneumoniae NOT DETECTED NOT DETECTED Final   Mycoplasma pneumoniae NOT DETECTED NOT DETECTED Final           Scheduled Meds: . aspirin EC  81 mg Oral QHS  . calcium carbonate  1 tablet Oral Daily  . Chlorhexidine Gluconate Cloth  6 each Topical Q0600  . enoxaparin (LOVENOX) injection  40 mg Subcutaneous Q24H  . fentaNYL      . folic acid  1 mg Oral Daily  . glipiZIDE  5 mg Oral QAC breakfast  . guaiFENesin  600 mg Oral BID  . insulin aspart  0-15 Units Subcutaneous Q4H  . ipratropium  0.5 mg Nebulization BID  . levalbuterol  1.25 mg Nebulization BID  . loratadine  10 mg Oral Daily  . methylPREDNISolone (SOLU-MEDROL) injection  40 mg Intravenous Q6H  . metoprolol tartrate  12.5 mg Oral BID  . midazolam      . mometasone-formoterol  2 puff Inhalation BID    . multivitamin with minerals  1 tablet Oral Daily  . mupirocin ointment  1 application Nasal BID  . pantoprazole  40 mg Oral Daily  . pravastatin  40 mg Oral QHS  . sodium bicarbonate  650 mg Oral TID  . tamsulosin  0.4 mg Oral q morning - 10a  . triamcinolone  1 spray Nasal QHS   Continuous Infusions: . piperacillin-tazobactam (ZOSYN)  IV 3.375 g (06/09/17 0820)  . sodium chloride    . vancomycin 1,000 mg (06/09/17 1020)     LOS: 2 days    Time spent: 35 mins    Wylee Dorantes Arsenio Loader, MD [facility] Pager [phone]   If 7PM-7AM, please contact night-coverage [email or web address] Password [email or web address] 06/09/2017, 10:50 AM

## 2017-06-10 DIAGNOSIS — R0609 Other forms of dyspnea: Secondary | ICD-10-CM

## 2017-06-10 LAB — GLUCOSE, CAPILLARY
GLUCOSE-CAPILLARY: 187 mg/dL — AB (ref 65–99)
GLUCOSE-CAPILLARY: 301 mg/dL — AB (ref 65–99)
GLUCOSE-CAPILLARY: 276 mg/dL — AB (ref 65–99)
Glucose-Capillary: 208 mg/dL — ABNORMAL HIGH (ref 65–99)
Glucose-Capillary: 240 mg/dL — ABNORMAL HIGH (ref 65–99)

## 2017-06-10 LAB — CBC
HEMATOCRIT: 20.4 % — AB (ref 39.0–52.0)
HEMOGLOBIN: 7.1 g/dL — AB (ref 13.0–17.0)
MCH: 32.7 pg (ref 26.0–34.0)
MCHC: 34.8 g/dL (ref 30.0–36.0)
MCV: 94 fL (ref 78.0–100.0)
Platelets: 76 10*3/uL — ABNORMAL LOW (ref 150–400)
RBC: 2.17 MIL/uL — AB (ref 4.22–5.81)
RDW: 18.1 % — ABNORMAL HIGH (ref 11.5–15.5)
WBC: 7.7 10*3/uL (ref 4.0–10.5)

## 2017-06-10 LAB — BASIC METABOLIC PANEL
Anion gap: 9 (ref 5–15)
BUN: 32 mg/dL — AB (ref 6–20)
CALCIUM: 6.8 mg/dL — AB (ref 8.9–10.3)
CO2: 21 mmol/L — ABNORMAL LOW (ref 22–32)
CREATININE: 1.01 mg/dL (ref 0.61–1.24)
Chloride: 104 mmol/L (ref 101–111)
GFR calc Af Amer: >60 mL/min (ref >60)
GFR calc non Af Amer: >60 mL/min (ref >60)
Glucose, Bld: 197 mg/dL — ABNORMAL HIGH (ref 65–99)
Potassium: 3.6 mmol/L (ref 3.5–5.1)
SODIUM: 134 mmol/L — AB (ref 135–145)

## 2017-06-10 LAB — MAGNESIUM: Magnesium: 1.8 mg/dL (ref 1.7–2.4)

## 2017-06-10 MED ORDER — POTASSIUM CHLORIDE CRYS ER 20 MEQ PO TBCR
40.0000 meq | EXTENDED_RELEASE_TABLET | Freq: Once | ORAL | Status: AC
  Administered 2017-06-10: 40 meq via ORAL
  Filled 2017-06-10: qty 2

## 2017-06-10 MED ORDER — INSULIN ASPART 100 UNIT/ML ~~LOC~~ SOLN
0.0000 [IU] | Freq: Three times a day (TID) | SUBCUTANEOUS | Status: DC
  Administered 2017-06-11 (×2): 5 [IU] via SUBCUTANEOUS
  Administered 2017-06-11: 8 [IU] via SUBCUTANEOUS
  Administered 2017-06-12 (×2): 5 [IU] via SUBCUTANEOUS

## 2017-06-10 MED ORDER — SODIUM CHLORIDE 0.9 % IV SOLN
1500.0000 mg | INTRAVENOUS | Status: DC
  Administered 2017-06-11: 1500 mg via INTRAVENOUS
  Filled 2017-06-10: qty 1500

## 2017-06-10 NOTE — Progress Notes (Signed)
OT Cancellation Note  Patient Details Name: Mark Howell MRN: [id number] DOB: 07/07/1940   Cancelled Treatment:    Reason Eval/Treat Not Completed: Other (comment).  NT is with pt at this time.  Will check back tomorrow.  Vaneza Pickart 06/10/2017, 11:05 AM  Lesle Chris, OTR/L [phone] 06/10/2017

## 2017-06-10 NOTE — Progress Notes (Signed)
OT Cancellation Note  Patient Details Name: Mark Howell MRN: [id number] DOB: [birth year]   Cancelled Treatment:    Reason Eval/Treat Not Completed: Other (comment). Pt is eating breakfast and waiting for respiratory. He would like to try to get up a little later. Will try to return later this am.  [facility] 06/10/2017, 9:27 AM  Lesle Chris, OTR/L [phone] 06/10/2017

## 2017-06-10 NOTE — Progress Notes (Signed)
PROGRESS NOTE    Mark Howell  IRS:[id number] DOB: [birth year] DOA: 06/06/2017 PCP: [name], MD   Brief Narrative:  77 year old male with history of psoriatic arthritis on chronic prednisone, metastatic lung cancer on chemotherapy was sent to the hospital due to concerns of polydipsia, polyuria and dehydration.  He was noted to likely from dehydration which has improved.  Patient was diagnosed with new onset diabetes therefore started on glipizide.  His stay was complicated by acute shortness of breath and fever therefore started on antibiotics, placed on BiPAP and had to be diuresed due to positive balance of fluid.  CTA was neg for PE but showed PNA. Started on Vanc and Zosyn.    Assessment & Plan:   Active Problems:   Dehydration  Acute respiratory failure with hypoxia; improved Healthcare acquired pneumonia Elevated d-dimer -Currently he has been transitioned to nasal cannula, plans to wean this down today -CTA chest is negative for pulmonary embolism but shows multifocal pneumonia - Blood culture negative thus far - UA is negative from 2/6  -Vancomycin and Zosyn day 3 -Appreciate pulmonary input -Continue Solu-Medrol 40 mg every 8   Acute kidney injury, resolved Hyponatremia, at baseline -Provide supportive care  Generalized weakness -Deconditioning along with his comorbidities -Physical therapy recommends home PT.  Skilled nursing facility declined  Diabetes mellitus type 2, new diagnosis -Glipizide started this admission.  Blood glucose is slightly elevated but is on Solu-Medrol.  We will continue this -Continue Accu-Chek and sliding scale -Appreciate diabetes coordinator input  Sinus tachycardia -Improved  COPD -Appears to be stable -Nebulizer treatments as needed  Metastatic non-small lung cell carcinoma -Follows outpatient with oncology, Dr Julien Nordmann.   DVT prophylaxis: Lovenox Code Status: Full Family Communication: At the bedside Disposition  Plan: Patient has been off BiPAP for over 36 hours.  We will transfer him to tele   Subjective: No complaints feels better but does still have exertional sob.   Objective: Vitals:   06/10/17 0400 06/10/17 0800 06/10/17 0951 06/10/17 1001  BP: (!) 94/59     Pulse: 85     Resp: (!) 25     Temp:  97.6 F (36.4 C)    TempSrc:  Oral    SpO2: 99%  96% 94%  Weight:      Height:        Intake/Output Summary (Last 24 hours) at 06/10/2017 1114 Last data filed at 06/10/2017 1011 Gross per 24 hour  Intake 650 ml  Output 800 ml  Net -150 ml   Filed Weights   06/06/17 1439 06/06/17 2138  Weight: 73.9 kg (163 lb) 75.5 kg (166 lb 7.2 oz)    Examination:  General exam: Appears much more comfortable, currently on 1L [place].  respiratory system: diffuse mild exp wheezing noted this morning.  Cardiovascular system: Sinus tachycardia, S1 & S2 heard, RRR. No JVD, murmurs, rubs, gallops or clicks. No pedal edema. Gastrointestinal system: Abdomen is nondistended, soft and nontender. No organomegaly or masses felt. Normal bowel sounds heard. Central nervous system: Alert and oriented. No focal neurological deficits. Extremities: Symmetric 5 x 5 power. Skin: No rashes, lesions or ulcers Psychiatry: Judgement and insight appear normal. Mood & affect appropriate.     Data Reviewed:   CBC: Recent Labs  Lab 06/06/17 1117 06/06/17 2139 06/07/17 0607 06/08/17 0418 06/09/17 0500 06/10/17 0500  WBC 10.2 7.9 7.1 6.1 4.4 7.7  NEUTROABS 7.6*  --  5.4  --   --   --   HGB 11.1* 9.5*  9.1* 8.1* 8.0* 7.1*  HCT 33.2* 28.5* 26.6* 23.6* 23.6* 20.4*  MCV 97.9 96.9 95.3 94.8 92.9 94.0  PLT 158 111* 100* 84* 84* 76*   Basic Metabolic Panel: Recent Labs  Lab 06/06/17 1117 06/06/17 2139 06/07/17 0607 06/08/17 0418 06/09/17 0500 06/10/17 0500  NA 130*  --  133* 132* 135 134*  K 4.3  --  4.1 3.9 3.9 3.6  CL 96*  --  102 103 102 104  CO2 19*  --  20* 19* 20* 21*  GLUCOSE 212*  --  143* 137* 208*  197*  BUN 31*  --  21* 18 29* 32*  CREATININE 1.36* 1.20 1.08 1.19 1.33* 1.01  CALCIUM 9.0  --  7.7* 6.9* 7.5* 6.8*  MG  --   --   --  0.6* 1.9 1.8   GFR: Estimated Creatinine Clearance: 65.4 mL/min (by C-G formula based on SCr of 1.01 mg/dL). Liver Function Tests: Recent Labs  Lab 06/06/17 1117  AST 27  ALT 17  ALKPHOS 97  BILITOT 0.7  PROT 7.8  ALBUMIN 3.2*   No results for input(s): LIPASE, AMYLASE in the last 168 hours. No results for input(s): AMMONIA in the last 168 hours. Coagulation Profile: No results for input(s): INR, PROTIME in the last 168 hours. Cardiac Enzymes: Recent Labs  Lab 06/08/17 1206 06/08/17 2020 06/09/17 0400  TROPONINI 0.05* 0.03* <0.03   BNP (last 3 results) No results for input(s): PROBNP in the last 8760 hours. HbA1C: No results for input(s): HGBA1C in the last 72 hours. CBG: Recent Labs  Lab 06/09/17 1551 06/09/17 2127 06/09/17 2322 06/10/17 0642 06/10/17 0804  GLUCAP 315* 174* 170* 208* 187*   Lipid Profile: No results for input(s): CHOL, HDL, LDLCALC, TRIG, CHOLHDL, LDLDIRECT in the last 72 hours. Thyroid Function Tests: No results for input(s): TSH, T4TOTAL, FREET4, T3FREE, THYROIDAB in the last 72 hours. Anemia Panel: No results for input(s): VITAMINB12, FOLATE, FERRITIN, TIBC, IRON, RETICCTPCT in the last 72 hours. Sepsis Labs: Recent Labs  Lab 06/06/17 1521 06/06/17 1737 06/08/17 1040 06/08/17 1100  PROCALCITON  --   --  1.02  --   LATICACIDVEN 1.44 1.14  --  2.0*    Recent Results (from the past 240 hour(s))  Urine Culture     Status: Abnormal   Collection Time: 06/06/17 12:30 PM  Result Value Ref Range Status   Specimen Description   Final    URINE, CLEAN CATCH Performed at [facility], [place] [address] [place], [place] [address]    Special Requests   Final    NONE Performed at [facility], [place] [address], [place], [place] [address]    Culture (A)  Final      <10,000 COLONIES/mL INSIGNIFICANT GROWTH Performed at [facility] [address], [place], [place] [address]    Report Status 06/09/2017 FINAL  Final  Urine culture     Status: None   Collection Time: 06/06/17  3:09 PM  Result Value Ref Range Status   Specimen Description   Final    URINE, CLEAN CATCH Performed at [facility], [place] [address] [place], [place] [address]    Special Requests   Final    Normal Performed at [facility], [place] [address], [place], [place] [address]    Culture   Final    NO GROWTH Performed at [facility], [place] [address], [place], [place] [address]  Report Status 06/07/2017 FINAL  Final  Blood culture (routine x 2)     Status: None (Preliminary result)   Collection Time: 06/06/17  3:10 PM  Result Value Ref Range Status   Specimen Description   Final    BLOOD RIGHT PORTA CATH Performed at [facility] [address], [place], [place] [address]    Special Requests   Final    BOTTLES DRAWN AEROBIC AND ANAEROBIC Blood Culture adequate volume Performed at [facility] [address], [place], [place] [address]    Culture   Final    NO GROWTH 4 DAYS Performed at [facility], [place] [address] [place], [place] [address]    Report Status PENDING  Incomplete  Blood culture (routine x 2)     Status: None (Preliminary result)   Collection Time: 06/06/17  5:25 PM  Result Value Ref Range Status   Specimen Description   Final    BLOOD LEFT ANTECUBITAL Performed at [place] [address], [place], [place] [address]    Special Requests   Final    BOTTLES DRAWN AEROBIC AND ANAEROBIC Blood Culture adequate volume Performed at [facility] [address], [place], [place] [address]    Culture   Final    NO GROWTH 4 DAYS Performed at [facility], [place] [address] [place], [place] [address]    Report Status  PENDING  Incomplete  MRSA PCR Screening     Status: Abnormal   Collection Time: 06/06/17  9:16 PM  Result Value Ref Range Status   MRSA by PCR POSITIVE (A) NEGATIVE Final    Comment:        The GeneXpert MRSA Assay (FDA approved for NASAL specimens only), is one component of a comprehensive MRSA colonization surveillance program. It is not intended to diagnose MRSA infection nor to guide or monitor treatment for MRSA infections. RESULT CALLED TO, READ BACK BY AND VERIFIED WITH: Monia Pouch RN [id number] 06/06/17 A NAVARRO Performed at [facility], [place] [address] [place],  [address]   Respiratory Panel by PCR     Status: None   Collection Time: 06/08/17  2:10 PM  Result Value Ref Range Status   Adenovirus NOT DETECTED NOT DETECTED Final   Coronavirus 229E NOT DETECTED NOT DETECTED Final   Coronavirus HKU1 NOT DETECTED NOT DETECTED Final   Coronavirus NL63 NOT DETECTED NOT DETECTED Final   Coronavirus OC43 NOT DETECTED NOT DETECTED Final   Metapneumovirus NOT DETECTED NOT DETECTED Final   Rhinovirus / Enterovirus NOT DETECTED NOT DETECTED Final   Influenza A NOT DETECTED NOT DETECTED Final   Influenza A H1 NOT DETECTED NOT DETECTED Final   Influenza A H1 2009 NOT DETECTED NOT DETECTED Final   Influenza A H3 NOT DETECTED NOT DETECTED Final   Influenza B NOT DETECTED NOT DETECTED Final   Parainfluenza Virus 1 NOT DETECTED NOT DETECTED Final   Parainfluenza Virus 2 NOT DETECTED NOT DETECTED Final   Parainfluenza Virus 3 NOT DETECTED NOT DETECTED Final   Parainfluenza Virus 4 NOT DETECTED NOT DETECTED Final   Respiratory Syncytial Virus NOT DETECTED NOT DETECTED Final   Bordetella pertussis NOT DETECTED NOT DETECTED Final   Chlamydophila pneumoniae NOT DETECTED NOT DETECTED Final   Mycoplasma pneumoniae NOT DETECTED NOT DETECTED Final           Scheduled Meds: . aspirin EC  81 mg Oral QHS  . calcium carbonate  1 tablet Oral Daily  . Chlorhexidine  Gluconate Cloth  6 each Topical Q0600  . enoxaparin (LOVENOX) injection  40 mg Subcutaneous Q24H  . folic acid  1 mg Oral Daily  . glipiZIDE  5 mg Oral QAC breakfast  . guaiFENesin  600 mg Oral BID  . insulin aspart  0-15 Units Subcutaneous Q4H  . ipratropium  0.5 mg Nebulization BID  . levalbuterol  1.25 mg Nebulization BID  . loratadine  10 mg Oral Daily  . methylPREDNISolone (SOLU-MEDROL) injection  40 mg Intravenous Q12H  . metoprolol tartrate  12.5 mg Oral BID  . mometasone-formoterol  2 puff Inhalation BID  . multivitamin with minerals  1 tablet Oral Daily  . mupirocin ointment  1 application Nasal BID  . pantoprazole  40 mg Oral Daily  . pravastatin  40 mg Oral QHS  . sodium bicarbonate  650 mg Oral TID  . tamsulosin  0.4 mg Oral q morning - 10a  . triamcinolone  1 spray Nasal QHS   Continuous Infusions: . piperacillin-tazobactam (ZOSYN)  IV 3.375 g (06/10/17 0808)  . sodium chloride    . vancomycin Stopped (06/10/17 1111)     LOS: 3 days    Time spent: 35 mins    Ankit Arsenio Loader, MD [facility] Pager [phone]   If 7PM-7AM, please contact night-coverage [email or web address] Password [email or web address] 06/10/2017, 11:14 AM

## 2017-06-11 ENCOUNTER — Other Ambulatory Visit: Payer: Medicare Other

## 2017-06-11 ENCOUNTER — Inpatient Hospital Stay: Payer: Medicare Other | Admitting: Oncology

## 2017-06-11 ENCOUNTER — Ambulatory Visit: Payer: Medicare Other

## 2017-06-11 DIAGNOSIS — D649 Anemia, unspecified: Secondary | ICD-10-CM

## 2017-06-11 LAB — COMPREHENSIVE METABOLIC PANEL
ALBUMIN: 2.6 g/dL — AB (ref 3.5–5.0)
ALK PHOS: 65 U/L (ref 38–126)
ALT: 26 U/L (ref 17–63)
ANION GAP: 11 (ref 5–15)
AST: 43 U/L — ABNORMAL HIGH (ref 15–41)
BILIRUBIN TOTAL: 0.5 mg/dL (ref 0.3–1.2)
BUN: 33 mg/dL — AB (ref 6–20)
CALCIUM: 7.5 mg/dL — AB (ref 8.9–10.3)
CO2: 19 mmol/L — ABNORMAL LOW (ref 22–32)
Chloride: 102 mmol/L (ref 101–111)
Creatinine, Ser: 1.22 mg/dL (ref 0.61–1.24)
GFR calc Af Amer: >60 mL/min (ref >60)
GFR calc non Af Amer: 55 mL/min — ABNORMAL LOW (ref >60)
GLUCOSE: 286 mg/dL — AB (ref 65–99)
Potassium: 4.5 mmol/L (ref 3.5–5.1)
Sodium: 132 mmol/L — ABNORMAL LOW (ref 135–145)
TOTAL PROTEIN: 6.5 g/dL (ref 6.5–8.1)

## 2017-06-11 LAB — GLUCOSE, CAPILLARY
GLUCOSE-CAPILLARY: 224 mg/dL — AB (ref 65–99)
GLUCOSE-CAPILLARY: 204 mg/dL — AB (ref 65–99)
Glucose-Capillary: 286 mg/dL — ABNORMAL HIGH (ref 65–99)
Glucose-Capillary: 121 mg/dL — ABNORMAL HIGH (ref 65–99)

## 2017-06-11 LAB — CULTURE, BLOOD (ROUTINE X 2)
CULTURE: NO GROWTH
CULTURE: NO GROWTH
Special Requests: ADEQUATE
Special Requests: ADEQUATE

## 2017-06-11 LAB — CBC
HEMATOCRIT: 22.1 % — AB (ref 39.0–52.0)
HEMOGLOBIN: 7.5 g/dL — AB (ref 13.0–17.0)
MCH: 32.9 pg (ref 26.0–34.0)
MCHC: 33.9 g/dL (ref 30.0–36.0)
MCV: 96.9 fL (ref 78.0–100.0)
Platelets: 74 10*3/uL — ABNORMAL LOW (ref 150–400)
RBC: 2.28 MIL/uL — ABNORMAL LOW (ref 4.22–5.81)
RDW: 18.2 % — ABNORMAL HIGH (ref 11.5–15.5)
WBC: 9.1 10*3/uL (ref 4.0–10.5)

## 2017-06-11 LAB — PREPARE RBC (CROSSMATCH)

## 2017-06-11 LAB — MAGNESIUM: Magnesium: 2 mg/dL (ref 1.7–2.4)

## 2017-06-11 MED ORDER — SODIUM CHLORIDE 0.9 % IV SOLN
Freq: Once | INTRAVENOUS | Status: AC
  Administered 2017-06-11: 10:00:00 via INTRAVENOUS

## 2017-06-11 MED ORDER — GLIPIZIDE 10 MG PO TABS
10.0000 mg | ORAL_TABLET | Freq: Every day | ORAL | Status: DC
  Administered 2017-06-12: 10 mg via ORAL
  Filled 2017-06-11: qty 1

## 2017-06-11 MED ORDER — LIVING WELL WITH DIABETES BOOK
Freq: Once | Status: DC
  Filled 2017-06-11: qty 1

## 2017-06-11 MED ORDER — VANCOMYCIN HCL 10 G IV SOLR
1250.0000 mg | INTRAVENOUS | Status: DC
  Administered 2017-06-12: 1250 mg via INTRAVENOUS
  Filled 2017-06-11: qty 1250

## 2017-06-11 MED ORDER — OXYMETAZOLINE HCL 0.05 % NA SOLN
1.0000 | Freq: Two times a day (BID) | NASAL | Status: DC
  Administered 2017-06-11 – 2017-06-12 (×2): 1 via NASAL
  Filled 2017-06-11: qty 15

## 2017-06-11 NOTE — Progress Notes (Signed)
Pt's left nares is bleeding, has been bleeding for over an hour. RN encouraged pt to hold pressure and packed gauze in nares. Pt continuously changing tissue and gauze out and intentionally coughing in order to not swallow blood. MD notified. New orders given.

## 2017-06-11 NOTE — Progress Notes (Signed)
Pharmacy Antibiotic Note  Mark Howell is a 77 y.o. male admitted on 06/06/2017 with pneumonia.  Pharmacy has been consulted for vancomycin and zosyn dosing.  Noted immunocompromised state due to chronic steroids, chemotherapy.  Today, 06/11/2017 Day #4 antibiotics (vancomycin and zosyn)  Renal: SCr trending back up today  WBC WNL  afebrile  Plan:  SCr trending back up this am, if vancomycin to continue will need ot change to Vancomycin 1250mg  IV q24h   Will need to change levels if remains on vancomycin > 1-2 days  Zosyn 3.375g IV q8h (4 hour infusion time).   Daily SCr due to increase nephrotoxicity with above regimen  Microbiology unrevealing - suggest antibiotic de-escalation  Height: 6\' 1"  (185.4 cm) Weight: 166 lb 7.2 oz (75.5 kg) IBW/kg (Calculated) : 79.9  Temp (24hrs), Avg:97.4 F (36.3 C), Min:97.3 F (36.3 C), Max:97.5 F (36.4 C)  Recent Labs  Lab 06/06/17 1521 06/06/17 1737  06/07/17 0607 06/08/17 0418 06/08/17 1100 06/09/17 0500 06/10/17 0500 06/11/17 0418  WBC  --   --    < > 7.1 6.1  --  4.4 7.7 9.1  CREATININE  --   --    < > 1.08 1.19  --  1.33* 1.01 1.22  LATICACIDVEN 1.44 1.14  --   --   --  2.0*  --   --   --    < > = values in this interval not displayed.    Estimated Creatinine Clearance: 54.1 mL/min (by C-G formula based on SCr of 1.22 mg/dL).    Allergies  Allergen Reactions  . Hydrocodone Rash  . Oxycodone Rash   Antimicrobials this admission: 2/8 Vancomcyin >>  2/8 Zosyn >>    Dose adjustments this admission:   Microbiology results: 2/6 BCx: ngtd 2/6 UCx: <10K - insignificant growth 2/6 MRSA PCR: + 2/8 Resp Virus Panel: neg 2/8 flu: neg  Thank you for allowing pharmacy to be a part of this patient's care.  Doreene Eland, PharmD, BCPS.   Pager: [phone] 06/11/2017 12:48 PM

## 2017-06-11 NOTE — Progress Notes (Signed)
PROGRESS NOTE    Mark Howell  ZOX:[id number] DOB: Apr 24, 1941 DOA: 06/06/2017 PCP: [name], MD   Brief Narrative:  77 year old male with history of psoriatic arthritis on chronic prednisone, metastatic lung cancer on chemotherapy was sent to the hospital due to concerns of polydipsia, polyuria and dehydration.  He was noted to likely from dehydration which has improved.  Patient was diagnosed with new onset diabetes therefore started on glipizide.  His stay was complicated by acute shortness of breath and fever therefore started on antibiotics, placed on BiPAP and had to be diuresed due to positive balance of fluid.  CTA was neg for PE but showed PNA. Started on Vanc and Zosyn.    Assessment & Plan:   Active Problems:   Dehydration  Acute respiratory failure with hypoxia; improved Healthcare acquired pneumonia Elevated d-dimer but neg CTA for PE -Currently he has been transitioned to nasal cannula, plans to wean this down today -CTA chest is negative for pulmonary embolism but shows multifocal pneumonia - Blood culture negative thus far - UA is negative from 2/6  -Vancomycin and Zosyn day 4 -Appreciate pulmonary input -Will reduce his solumedrol to q12hrs   Symptomatic anemia -Medically and being on chemotherapy.  No active signs of blood loss.  Baseline is around 9.5 but today's 7.4.  Will transfuse him 2 units of PRBC  Acute kidney injury, resolved Hyponatremia, at baseline -Provide supportive care  Generalized weakness -Deconditioning along with his comorbidities -Physical therapy recommends home PT.  Skilled nursing facility declined  Diabetes mellitus type 2, new diagnosis -Glipizide 5 mg daily started this admission.  Patient is to remain hyperglycemic therefore will increase glipizide to 10 mg daily.  He is on steroids daily for now -Continue Accu-Chek and sliding scale -Appreciate diabetes coordinator input  Sinus tachycardia -Improved  COPD -Appears to  be stable -Nebulizer treatments as needed  Metastatic non-small lung cell carcinoma -Follows outpatient with oncology, Dr Julien Nordmann.   DVT prophylaxis: Lovenox Code Status: Full Family Communication: None at bedside Disposition Plan: Maintain inpatient stay for another 24-48 hours.  Subjective: No acute events overnight.  No complaints this morning. Patient states he is having couple large bowel movements overnight but they were not diet to staff, it was well formed.  Objective: Vitals:   06/10/17 1543 06/10/17 1954 06/10/17 2029 06/11/17 0436  BP: 118/78  128/69 130/89  Pulse: 94  (!) 103 87  Resp: 18  18 20   Temp: (!) 97.3 F (36.3 C)  (!) 97.5 F (36.4 C) (!) 97.5 F (36.4 C)  TempSrc: Oral  Oral Axillary  SpO2: 99% 98% 98% 98%  Weight:      Height:        Intake/Output Summary (Last 24 hours) at 06/11/2017 1102 Last data filed at 06/11/2017 0600 Gross per 24 hour  Intake 1440 ml  Output 1076 ml  Net 364 ml   Filed Weights   06/06/17 1439 06/06/17 2138  Weight: 73.9 kg (163 lb) 75.5 kg (166 lb 7.2 oz)    Examination:  General exam: Appears much more comfortable, currently on 1L [place].  respiratory system: Mild crackles at the bases Cardiovascular system: Sinus tachycardia, S1 & S2 heard, RRR. No JVD, murmurs, rubs, gallops or clicks. No pedal edema. Gastrointestinal system: Abdomen is nondistended, soft and nontender. No organomegaly or masses felt. Normal bowel sounds heard. Central nervous system: Alert and oriented. No focal neurological deficits. Extremities: Symmetric 5 x 5 power. Skin: No rashes, lesions or ulcers Psychiatry: Judgement and  insight appear normal. Mood & affect appropriate.     Data Reviewed:   CBC: Recent Labs  Lab 06/06/17 1117  06/07/17 0607 06/08/17 0418 06/09/17 0500 06/10/17 0500 06/11/17 0418  WBC 10.2   < > 7.1 6.1 4.4 7.7 9.1  NEUTROABS 7.6*  --  5.4  --   --   --   --   HGB 11.1*   < > 9.1* 8.1* 8.0* 7.1* 7.5*  HCT 33.2*    < > 26.6* 23.6* 23.6* 20.4* 22.1*  MCV 97.9   < > 95.3 94.8 92.9 94.0 96.9  PLT 158   < > 100* 84* 84* 76* 74*   < > = values in this interval not displayed.   Basic Metabolic Panel: Recent Labs  Lab 06/07/17 0607 06/08/17 0418 06/09/17 0500 06/10/17 0500 06/11/17 0418  NA 133* 132* 135 134* 132*  K 4.1 3.9 3.9 3.6 4.5  CL 102 103 102 104 102  CO2 20* 19* 20* 21* 19*  GLUCOSE 143* 137* 208* 197* 286*  BUN 21* 18 29* 32* 33*  CREATININE 1.08 1.19 1.33* 1.01 1.22  CALCIUM 7.7* 6.9* 7.5* 6.8* 7.5*  MG  --  0.6* 1.9 1.8 2.0   GFR: Estimated Creatinine Clearance: 54.1 mL/min (by C-G formula based on SCr of 1.22 mg/dL). Liver Function Tests: Recent Labs  Lab 06/06/17 1117 06/11/17 0418  AST 27 43*  ALT 17 26  ALKPHOS 97 65  BILITOT 0.7 0.5  PROT 7.8 6.5  ALBUMIN 3.2* 2.6*   No results for input(s): LIPASE, AMYLASE in the last 168 hours. No results for input(s): AMMONIA in the last 168 hours. Coagulation Profile: No results for input(s): INR, PROTIME in the last 168 hours. Cardiac Enzymes: Recent Labs  Lab 06/08/17 1206 06/08/17 2020 06/09/17 0400  TROPONINI 0.05* 0.03* <0.03   BNP (last 3 results) No results for input(s): PROBNP in the last 8760 hours. HbA1C: No results for input(s): HGBA1C in the last 72 hours. CBG: Recent Labs  Lab 06/10/17 0804 06/10/17 1217 06/10/17 1546 06/10/17 2206 06/11/17 0802  GLUCAP 187* 301* 276* 240* 224*   Lipid Profile: No results for input(s): CHOL, HDL, LDLCALC, TRIG, CHOLHDL, LDLDIRECT in the last 72 hours. Thyroid Function Tests: No results for input(s): TSH, T4TOTAL, FREET4, T3FREE, THYROIDAB in the last 72 hours. Anemia Panel: No results for input(s): VITAMINB12, FOLATE, FERRITIN, TIBC, IRON, RETICCTPCT in the last 72 hours. Sepsis Labs: Recent Labs  Lab 06/06/17 1521 06/06/17 1737 06/08/17 1040 06/08/17 1100  PROCALCITON  --   --  1.02  --   LATICACIDVEN 1.44 1.14  --  2.0*    Recent Results (from the  past 240 hour(s))  Urine Culture     Status: Abnormal   Collection Time: 06/06/17 12:30 PM  Result Value Ref Range Status   Specimen Description   Final    URINE, CLEAN CATCH Performed at [facility], [place] [address], [place], [place] [address]    Special Requests   Final    NONE Performed at [facility], [place] [address], [place], [place] [address]    Culture (A)  Final    <10,000 COLONIES/mL INSIGNIFICANT GROWTH Performed at [place] [address] [place], [place] [address]    Report Status 06/09/2017 FINAL  Final  Urine culture     Status: None   Collection Time: 06/06/17  3:09 PM  Result Value Ref Range Status   Specimen Description  Final    URINE, CLEAN CATCH Performed at [facility], [place] [address], [place], [place] [address]    Special Requests   Final    Normal Performed at [facility], [place] [address], [place], [place] [address]    Culture   Final    NO GROWTH Performed at [facility], [place] [address] [place], [place] [address]    Report Status 06/07/2017 FINAL  Final  Blood culture (routine x 2)     Status: None (Preliminary result)   Collection Time: 06/06/17  3:10 PM  Result Value Ref Range Status   Specimen Description   Final    BLOOD RIGHT PORTA CATH Performed at [place] [address] [place], [place] [address]    Special Requests   Final    BOTTLES DRAWN AEROBIC AND ANAEROBIC Blood Culture adequate volume Performed at [place] [address], [place], [place] [address]    Culture   Final    NO GROWTH 4 DAYS Performed at [facility], [place] [address] [place], [place] [address]    Report Status PENDING  Incomplete  Blood culture (routine x 2)     Status: None (Preliminary result)   Collection Time: 06/06/17  5:25 PM  Result Value Ref Range Status   Specimen Description   Final    BLOOD  LEFT ANTECUBITAL Performed at [place] [address], [place], [place] [address]    Special Requests   Final    BOTTLES DRAWN AEROBIC AND ANAEROBIC Blood Culture adequate volume Performed at [place] [address], [place], [place] [address]    Culture   Final    NO GROWTH 4 DAYS Performed at [facility], [place] [address] [place], [place] [address]    Report Status PENDING  Incomplete  MRSA PCR Screening     Status: Abnormal   Collection Time: 06/06/17  9:16 PM  Result Value Ref Range Status   MRSA by PCR POSITIVE (A) NEGATIVE Final    Comment:        The GeneXpert MRSA Assay (FDA approved for NASAL specimens only), is one component of a comprehensive MRSA colonization surveillance program. It is not intended to diagnose MRSA infection nor to guide or monitor treatment for MRSA infections. RESULT CALLED TO, READ BACK BY AND VERIFIED WITH: Monia Pouch RN [id number] 06/06/17 A NAVARRO Performed at [facility], [place] [address], [place], [place] [address]   Respiratory Panel by PCR     Status: None   Collection Time: 06/08/17  2:10 PM  Result Value Ref Range Status   Adenovirus NOT DETECTED NOT DETECTED Final   Coronavirus 229E NOT DETECTED NOT DETECTED Final   Coronavirus HKU1 NOT DETECTED NOT DETECTED Final   Coronavirus NL63 NOT DETECTED NOT DETECTED Final   Coronavirus OC43 NOT DETECTED NOT DETECTED Final   Metapneumovirus NOT DETECTED NOT DETECTED Final   Rhinovirus / Enterovirus NOT DETECTED NOT DETECTED Final   Influenza A NOT DETECTED NOT DETECTED Final   Influenza A H1 NOT DETECTED NOT DETECTED Final   Influenza A H1 2009 NOT DETECTED NOT DETECTED Final   Influenza A H3 NOT DETECTED NOT DETECTED Final   Influenza B NOT DETECTED NOT DETECTED Final   Parainfluenza Virus 1 NOT DETECTED NOT DETECTED Final   Parainfluenza Virus 2 NOT DETECTED NOT DETECTED Final   Parainfluenza Virus 3 NOT DETECTED NOT  DETECTED Final   Parainfluenza Virus 4 NOT DETECTED NOT DETECTED Final   Respiratory Syncytial Virus NOT DETECTED NOT DETECTED Final   Bordetella pertussis NOT DETECTED NOT DETECTED Final   Chlamydophila pneumoniae NOT DETECTED NOT DETECTED Final   Mycoplasma pneumoniae NOT DETECTED NOT DETECTED Final           Scheduled Meds: . aspirin EC  81 mg Oral QHS  . calcium carbonate  1 tablet Oral Daily  . enoxaparin (LOVENOX) injection  40 mg Subcutaneous Q24H  . folic acid  1 mg Oral Daily  . glipiZIDE  5 mg Oral QAC breakfast  . guaiFENesin  600 mg Oral BID  . insulin aspart  0-15 Units Subcutaneous TID AC & HS  . ipratropium  0.5 mg Nebulization BID  . levalbuterol  1.25 mg Nebulization BID  . living well with diabetes book   Does not apply Once  . loratadine  10 mg Oral Daily  . methylPREDNISolone (SOLU-MEDROL) injection  40 mg Intravenous Q12H  . metoprolol tartrate  12.5 mg Oral BID  . mometasone-formoterol  2 puff Inhalation BID  . multivitamin with minerals  1 tablet Oral Daily  . mupirocin ointment  1 application Nasal BID  . pantoprazole  40 mg Oral Daily  . pravastatin  40 mg Oral QHS  . sodium bicarbonate  650 mg Oral TID  . tamsulosin  0.4 mg Oral q morning - 10a  . triamcinolone  1 spray Nasal QHS   Continuous Infusions: . piperacillin-tazobactam (ZOSYN)  IV 3.375 g (06/11/17 0929)  . sodium chloride    . vancomycin 1,500 mg (06/11/17 0930)     LOS: 4 days    Time spent: 30 mins    Ankit Arsenio Loader, MD [facility] Pager [phone]   If 7PM-7AM, please contact night-coverage [email or web address] Password [email or web address] 06/11/2017, 11:02 AM

## 2017-06-11 NOTE — Progress Notes (Signed)
These preliminary result these preliminary results were noted.  Awaiting final report.

## 2017-06-11 NOTE — Progress Notes (Signed)
OT Cancellation Note  Patient Details Name: Mark Howell MRN: [id number] DOB: Jul 16, 1940   Cancelled Treatment:    Reason Eval/Treat Not Completed: Medical issues which prohibited therapy  critical lab values : lactic acid 2.0      And HgB 7.5                                                                            Pt scheduled to receive blood will check back another day as schedule permits.      Mickel Baas [place], [place] 06/11/2017, 11:57 AM

## 2017-06-11 NOTE — Progress Notes (Signed)
PT Cancellation Note  Patient Details Name: Mark Howell MRN: [id number] DOB: 03/21/1941   Cancelled Treatment:     critical lab values : lactic acid 2.0      And HgB 7.5                                                                           Pt scheduled to receive blood will check back another day as schedule permits.  Pt has been evaluated by PT with rec for [facility] as spouse declined SNF.    Rica Koyanagi  PTA [facility]  Acute  Rehab Pager      [phone]

## 2017-06-12 ENCOUNTER — Telehealth: Payer: Self-pay | Admitting: *Deleted

## 2017-06-12 LAB — TYPE AND SCREEN
ABO/RH(D): O POS
ANTIBODY SCREEN: NEGATIVE
Unit division: 0
Unit division: 0

## 2017-06-12 LAB — COMPREHENSIVE METABOLIC PANEL
ALBUMIN: 2.9 g/dL — AB (ref 3.5–5.0)
ALT: 27 U/L (ref 17–63)
AST: 44 U/L — AB (ref 15–41)
Alkaline Phosphatase: 68 U/L (ref 38–126)
Anion gap: 12 (ref 5–15)
BILIRUBIN TOTAL: 0.5 mg/dL (ref 0.3–1.2)
BUN: 32 mg/dL — AB (ref 6–20)
CHLORIDE: 106 mmol/L (ref 101–111)
CO2: 19 mmol/L — ABNORMAL LOW (ref 22–32)
CREATININE: 1.05 mg/dL (ref 0.61–1.24)
Calcium: 8 mg/dL — ABNORMAL LOW (ref 8.9–10.3)
GFR calc Af Amer: >60 mL/min (ref >60)
GFR calc non Af Amer: >60 mL/min (ref >60)
GLUCOSE: 196 mg/dL — AB (ref 65–99)
Potassium: 4.8 mmol/L (ref 3.5–5.1)
Sodium: 137 mmol/L (ref 135–145)
Total Protein: 6.4 g/dL — ABNORMAL LOW (ref 6.5–8.1)

## 2017-06-12 LAB — BPAM RBC
BLOOD PRODUCT EXPIRATION DATE: 201903112359
Blood Product Expiration Date: 201903112359
ISSUE DATE / TIME: 201902111557
ISSUE DATE / TIME: 201902111300
Unit Type and Rh: 5100
Unit Type and Rh: 5100

## 2017-06-12 LAB — CBC
HEMATOCRIT: 29.9 % — AB (ref 39.0–52.0)
Hemoglobin: 10 g/dL — ABNORMAL LOW (ref 13.0–17.0)
MCH: 31.6 pg (ref 26.0–34.0)
MCHC: 33.4 g/dL (ref 30.0–36.0)
MCV: 94.6 fL (ref 78.0–100.0)
Platelets: 94 10*3/uL — ABNORMAL LOW (ref 150–400)
RBC: 3.16 MIL/uL — AB (ref 4.22–5.81)
RDW: 18.7 % — AB (ref 11.5–15.5)
WBC: 12.6 10*3/uL — AB (ref 4.0–10.5)

## 2017-06-12 LAB — GLUCOSE, CAPILLARY
Glucose-Capillary: 208 mg/dL — ABNORMAL HIGH (ref 65–99)
Glucose-Capillary: 243 mg/dL — ABNORMAL HIGH (ref 65–99)

## 2017-06-12 LAB — MAGNESIUM: Magnesium: 1.8 mg/dL (ref 1.7–2.4)

## 2017-06-12 MED ORDER — CONTINUOUS BLOOD GLUC SENSOR MISC: 1.0000 | 2 refills | Status: DC

## 2017-06-12 MED ORDER — HEPARIN SOD (PORK) LOCK FLUSH 100 UNIT/ML IV SOLN
500.0000 [IU] | INTRAVENOUS | Status: AC | PRN
  Administered 2017-06-12: 500 [IU]

## 2017-06-12 MED ORDER — CONTINUOUS BLOOD GLUC SENSOR MISC: 1.0000 | 2 refills | Status: AC

## 2017-06-12 MED ORDER — AMOXICILLIN-POT CLAVULANATE 875-125 MG PO TABS: 1.0000 | ORAL_TABLET | Freq: Two times a day (BID) | ORAL | 0 refills | Status: AC

## 2017-06-12 MED ORDER — PREDNISONE 20 MG PO TABS: 20.0000 mg | ORAL_TABLET | Freq: Every day | ORAL | 0 refills | Status: DC

## 2017-06-12 MED ORDER — BLOOD GLUCOSE MONITOR KIT: PACK | 0 refills | Status: AC

## 2017-06-12 MED ORDER — GLIPIZIDE 10 MG PO TABS: 10.0000 mg | ORAL_TABLET | Freq: Every day | ORAL | 1 refills | Status: DC

## 2017-06-12 NOTE — Evaluation (Signed)
Occupational Therapy Evaluation Patient Details Name: Mark Howell MRN: [id number] DOB: [birth year] Today's Date: 06/12/2017    History of Present Illness 77 year old male with history of psoriatic arthritis on chronic prednisone, metastatic lung cancer on chemotherapy was sent to the hospital due to concerns of polydipsia, polyuria and dehydration   Clinical Impression   Pt plans to go home this day.  Educated pt on safety with ADL activity- including seat in the shower, using urinal at night rather than walking to bathroom, use of BSC if needed.      Follow Up Recommendations  Home health OT;Supervision/Assistance - 24 hour    Equipment Recommendations  None recommended by OT    Recommendations for Other Services       Precautions / Restrictions        Mobility Bed Mobility Overal bed mobility: Needs Assistance Bed Mobility: Supine to Sit     Supine to sit: Supervision        Transfers Overall transfer level: Needs assistance Equipment used: Rolling walker (2 wheeled) Transfers: Sit to/from Stand;Stand Pivot Transfers Sit to Stand: Min guard Stand pivot transfers: Min guard            Balance Overall balance assessment: Needs assistance   Sitting balance-Leahy Scale: Fair     Standing balance support: Bilateral upper extremity supported Standing balance-Leahy Scale: Poor Standing balance comment: relies on BUE support                           ADL either performed or assessed with clinical judgement   ADL Overall ADL's : Needs assistance/impaired Eating/Feeding: Set up;Sitting   Grooming: Set up;Sitting   Upper Body Bathing: Set up;Sitting   Lower Body Bathing: Minimal assistance;Sit to/from stand;Cueing for safety;Cueing for sequencing   Upper Body Dressing : Set up;Sitting   Lower Body Dressing: Minimal assistance;Sit to/from stand;Cueing for safety;Cueing for sequencing   Toilet Transfer: Minimal assistance;RW;Comfort height  toilet   Toileting- Clothing Manipulation and Hygiene: Minimal assistance;Sit to/from stand;Cueing for safety;Cueing for sequencing               Vision Patient Visual Report: No change from baseline       Perception     Praxis      Pertinent Vitals/Pain Pain Assessment: No/denies pain     Hand Dominance     Extremity/Trunk Assessment         Cervical / Trunk Assessment Cervical / Trunk Assessment: Normal   Communication Communication Communication: No difficulties   Cognition Arousal/Alertness: Awake/alert Behavior During Therapy: WFL for tasks assessed/performed Overall Cognitive Status: Within Functional Limits for tasks assessed                                                Home Living Family/patient expects to be discharged to:: Private residence Living Arrangements: Spouse/significant other Available Help at Discharge: Family;Available 24 hours/day Type of Home: House Home Access: Stairs to enter [employer] of Steps: 1   Home Layout: One level     Bathroom Shower/Tub: Walk-in shower         Home Equipment: Environmental consultant - 2 wheels          Prior Functioning/Environment Level of Independence: Needs assistance  Gait / Transfers Assistance Needed: furniture walks ADL's / Homemaking Assistance Needed: assist for bathing/dressing  Comments: held onto walls/furniture at home        OT Problem List: Decreased strength;Decreased activity tolerance      OT Treatment/Interventions:      OT Goals(Current goals can be found in the care plan section) Acute Rehab OT Goals Patient Stated Goal: build cabinets OT Goal Formulation: With patient  OT Frequency:     Barriers to D/C:            Co-evaluation              AM-PAC PT "6 Clicks" Daily Activity     Outcome Measure Help from another person eating meals?: None Help from another person taking care of personal grooming?: A Little Help from another  person toileting, which includes using toliet, bedpan, or urinal?: A Little Help from another person bathing (including washing, rinsing, drying)?: A Little Help from another person to put on and taking off regular upper body clothing?: A Little Help from another person to put on and taking off regular lower body clothing?: A Little 6 Click Score: 19   End of Session Equipment Utilized During Treatment: Rolling walker Nurse Communication: Mobility status  Activity Tolerance: Patient tolerated treatment well Patient left: in chair;with call bell/phone within reach  OT Visit Diagnosis: Unsteadiness on feet (R26.81);Muscle weakness (generalized) (M62.81)                Time: 8341-9622 OT Time Calculation (min): 37 min Charges:  OT General Charges $OT Visit: 1 Visit OT Evaluation $OT Eval Moderate Complexity: 1 Mod OT Treatments $Self Care/Home Management : 8-22 mins G-Codes:     Kari Baars, [place]  Payton Mccallum D 06/12/2017, 1:52 PM

## 2017-06-12 NOTE — Progress Notes (Signed)
Case Manager will follow up with patient about insurance concerns and additional home support.   Kathrin Greathouse, Latanya Presser, MSW Clinical Social Worker  [phone] 06/12/2017  11:25 AM

## 2017-06-12 NOTE — Telephone Encounter (Signed)
Please reschedule treatment and follow up visit Middle of next week.

## 2017-06-12 NOTE — Discharge Summary (Signed)
Physician Discharge Summary  Mark Howell NFA:[id number] DOB: 27-Sep-1940 DOA: 06/06/2017  PCP: [name], MD  Admit date: 06/06/2017 Discharge date: 06/12/2017  Admitted From:  Disposition:   Recommendations for Outpatient Follow-up:  1. Follow up with PCP in 1-2 weeks 2. Take Augmentin for 1 week Twice daily for 7 days  3. Take Prednisone orally for next 3 days and then transition to Decadron as prescibed for chemo  4. Started patient on Glipizide 10mg  orally daily due to new dx of DM2.  5. Follow up with Dr Mora Appl for Chemo.   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 He was given aggressive IV fluids which improved his hydration status and started feeling little better.  He also had a new diagnosis of diabetes mellitus type 2 with hemoglobin of 7.4 therefore he was started on glipizide 5 mg daily which was increased to 10 mg daily during his hospitalization. During his stay he acutely went into short of breath which was concerning for fluid overload versus pneumonia versus pulmonary embolism given high risk factor in the setting of lung cancer.  He was placed on BiPAP and transition to stepdown unit.  He received multiple breathing treatments, IV steroids, broad-spectrum antibiotics vancomycin and Zosyn and 1 dose of IV Lasix.  He was weaned off of BiPAP and CT of the chest was done which was negative for pulmonary embolism but it showed multifocal pneumonia.  His vancomycin and Zosyn was continued and later his vancomycin was discontinued as he started improving.  He was transitioned to MedSurg floor and his nasal cannula was weaned off.  He continued to do well on MedSurg for and was evaluated by  physical therapy.  They recommended skilled nursing facility but family insisted patient get home physical therapy therefore arrangements were made with the help of case management. The day before his discharge patient had an episode of epistaxis likely from dry nose from wearing nasal cannula.  Had a nasal packing in for several hours which subsided his bleeding and following day his bleeding had resolved.  Today he is deemed medically stable and has reached maximum benefit from an hospital stay.  He will be discharged today with outpatient follow-up recommendations as stated above.    Discharge Diagnoses:  Active Problems:   Dehydration    Discharge Instructions   Allergies as of 06/12/2017      Reactions   Hydrocodone Rash   Oxycodone Rash      Medication List    TAKE these medications   amoxicillin-clavulanate 875-125 MG tablet Commonly known as:  AUGMENTIN Take 1 tablet by mouth every 12 (twelve) hours for 7 days.   aspirin EC 81 MG tablet Take 81 mg by mouth at bedtime.   calcium carbonate 500 MG chewable tablet Commonly known as:  TUMS - dosed in mg elemental calcium Chew 1 tablet by mouth daily.   Cetirizine HCl 10 MG Tbdp Take 1 tablet by mouth daily.   CO Q 10 PO Take 1 tablet by mouth daily.   Continuous Blood Gluc Sensor Misc 1 each by Does not apply route as directed. Use as directed every 10 days. May dispense FreeStyle [employer] or similar.   DELSYM COUGH/CHEST CONGEST DM PO Take 30 mLs by mouth  daily as needed (cold symptoms).   dexamethasone 4 MG tablet Commonly known as:  DECADRON 4 mg by mouth twice a day the day before, day of and day after chemotherapy every 3 weeks.   diphenhydramine-acetaminophen 25-500 MG Tabs tablet Commonly known as:  TYLENOL PM Take 1 tablet by mouth at bedtime as needed (sleep).   diphenoxylate-atropine 2.5-0.025 MG tablet Commonly known as:  LOMOTIL Take 2 tablets by mouth 4 (four) times daily as needed  for diarrhea or loose stools.   fluticasone-salmeterol 115-21 MCG/ACT inhaler Commonly known as:  ADVAIR HFA Inhale 2 puffs into the lungs 2 (two) times daily.   folic acid 1 MG tablet Commonly known as:  FOLVITE Take 1 tablet (1 mg total) by mouth daily.   glipiZIDE 10 MG tablet Commonly known as:  GLUCOTROL Take 1 tablet (10 mg total) by mouth daily before breakfast. Start taking on:  06/13/2017   guaiFENesin 600 MG 12 hr tablet Commonly known as:  MUCINEX Take 1 tablet (600 mg total) by mouth 2 (two) times daily.   IMODIUM A-D 2 MG tablet Generic drug:  loperamide Take 2 mg by mouth daily as needed for diarrhea or loose stools.   lidocaine 2 % solution Commonly known as:  XYLOCAINE Use as directed 20 mLs in the mouth or throat every 3 (three) hours as needed for mouth pain.   lidocaine-prilocaine cream Commonly known as:  EMLA Apply 1 application topically as needed. What changed:  reasons to take this   LORazepam 0.5 MG tablet Commonly known as:  ATIVAN Take 1 tablet (0.5 mg total) by mouth 2 (two) times daily as needed for anxiety.   Lutein 10 MG Tabs Take 10 mg by mouth at bedtime.   magic mouthwash Soln Take 5 mLs by mouth 4 (four) times daily as needed for mouth pain. What changed:  additional instructions   METHYL B-12 PO Take 1 tablet by mouth daily.   metoprolol tartrate 25 MG tablet Commonly known as:  LOPRESSOR Take 0.5 tablets (12.5 mg total) by mouth 2 (two) times daily.   multivitamin with minerals Tabs tablet Take 1 tablet by mouth daily.   pantoprazole 40 MG tablet Commonly known as:  PROTONIX Take 40 mg by mouth daily.   pravastatin 40 MG tablet Commonly known as:  PRAVACHOL Take 40 mg by mouth at bedtime.   predniSONE 20 MG tablet Commonly known as:  DELTASONE Take 1 tablet (20 mg total) by mouth daily.   prochlorperazine 10 MG tablet Commonly known as:  COMPAZINE Take 1 tablet (10 mg total) by mouth every 6 (six) hours as needed  for nausea or vomiting.   tamsulosin 0.4 MG Caps capsule Commonly known as:  FLOMAX TAKE (1) CAPSULE DAILY, START 4 DAYS BEFORE PROCEDURE What changed:  See the new instructions.   triamcinolone 55 MCG/ACT Aero nasal inhaler Commonly known as:  NASACORT Place 1 spray into the nose at bedtime.   VENTOLIN HFA 108 (90 Base) MCG/ACT inhaler Generic drug:  albuterol Inhale 2 puffs into the lungs every 6 (six) hours as needed for wheezing or shortness of breath.      Follow-up Information    [facility].   Specialty:  [place] Why:  [facility] physical therapy/occupational therapy Contact information: [address] [place] [address] [phone]        [name], MD. Schedule an appointment as soon as possible for a visit in 1 week(s).   Specialty:  Family Medicine Contact information: [place] [address] [phone]        Curt Bears, MD. Schedule an appointment as soon as possible for a visit in 1 week(s).   Specialty:  Oncology Contact information: [address] [place] [address] [phone]          Allergies  Allergen Reactions  . Hydrocodone Rash  . Oxycodone Rash    On your next visit with your primary care physician please Get Medicines reviewed and adjusted.   Please request your Prim.MD to go over all Hospital Tests and Procedure/Radiological results at the follow up, please get all Hospital records sent to your Prim MD by signing hospital release before you go home.   If you experience worsening of your admission symptoms, develop shortness of breath, life threatening emergency, suicidal or homicidal thoughts you must seek medical attention immediately by calling 911 or calling your MD immediately  if symptoms less severe.  You Must read complete instructions/literature along with all the possible adverse reactions/side effects for all the Medicines you  take and that have been prescribed to you. Take any new Medicines after you have completely understood and accpet all the possible adverse reactions/side effects.   Do not drive, operate heavy machinery, perform activities at heights, swimming or participation in water activities or provide baby sitting services if your were admitted for syncope or siezures until you have seen by Primary MD or a Neurologist and advised to do so again.  Do not drive when taking Pain medications.    Do not take more than prescribed Pain, Sleep and Anxiety Medications  Special Instructions: If you have smoked or chewed Tobacco  in the last 2 yrs please stop smoking, stop any regular Alcohol  and or any Recreational drug use.  Wear Seat belts while driving.   Please note  You were cared for by a hospitalist during your hospital stay. If you have any questions about your discharge medications or the care you received while you were in the hospital after you are discharged, you can call the unit and asked to speak with the hospitalist on call if the hospitalist that took care of you is not available. Once you are discharged, your primary care physician will handle any further medical issues. Please note that NO REFILLS for any discharge medications will be authorized once you are discharged, as it is imperative that you return to your primary care physician (or establish a relationship with a primary care physician if you do not have one) for your aftercare needs so that they can reassess your need for medications and monitor your lab values.   Increase activity slowly        Consultations:  Pulmonary   Procedures/Studies:      Subjective: No acute events overnight.  Discharge Exam: Vitals:   06/11/17 2240 06/12/17 0624  BP: 99/67 122/75  Pulse: 82 63  Resp: 20 20  Temp: 97.6 F (36.4 C) 98.4 F (36.9 C)  SpO2: 95% 98%   Vitals:   06/11/17 1638 06/11/17 1831 06/11/17 2240 06/12/17  0624  BP: 130/84 (!) 143/94 99/67 122/75  Pulse: 91 93 82 63  Resp: 18 20 20 20   Temp: 97.6 F (36.4 C) 97.8 F (36.6 C) 97.6 F (36.4 C) 98.4 F (36.9 C)  TempSrc: Axillary Axillary Axillary Oral  SpO2: 93% 93% 95% 98%  Weight:      Height:        General: Pt is  alert, awake, not in acute distress Cardiovascular: RRR, S1/S2 +, no rubs, no gallops Respiratory: CTA bilaterally, no wheezing, no rhonchi Abdominal: Soft, NT, ND, bowel sounds + Extremities: no edema, no cyanosis    The results of significant diagnostics from this hospitalization (including imaging, microbiology, ancillary and laboratory) are listed below for reference.     Microbiology: Recent Results (from the past 240 hour(s))  Urine Culture     Status: Abnormal   Collection Time: 06/06/17 12:30 PM  Result Value Ref Range Status   Specimen Description   Final    URINE, CLEAN CATCH Performed at [facility], [address] [place], [place] [address]    Special Requests   Final    NONE Performed at [facility], [place] [address], [place], [place] [address]    Culture (A)  Final    <10,000 COLONIES/mL INSIGNIFICANT GROWTH Performed at [facility] [address], [place], [place] [address]    Report Status 06/09/2017 FINAL  Final  Urine culture     Status: None   Collection Time: 06/06/17  3:09 PM  Result Value Ref Range Status   Specimen Description   Final    URINE, CLEAN CATCH Performed at [facility], [place] [address], [place], [place] [address]    Special Requests   Final    Normal Performed at [facility], [place] [address], [place], [place] [address]    Culture   Final    NO GROWTH Performed at [facility], [place] [address] [place], [place] [address]    Report Status 06/07/2017 FINAL  Final  Blood culture (routine x 2)     Status: None   Collection Time: 06/06/17  3:10 PM  Result Value Ref  Range Status   Specimen Description   Final    BLOOD RIGHT PORTA CATH Performed at [place] [address] [place], [place] [address]    Special Requests   Final    BOTTLES DRAWN AEROBIC AND ANAEROBIC Blood Culture adequate volume Performed at [place] [address] [place], [place] [address]    Culture   Final    NO GROWTH 5 DAYS Performed at [facility], [place] [address], [place], [place] [address]    Report Status 06/11/2017 FINAL  Final  Blood culture (routine x 2)     Status: None   Collection Time: 06/06/17  5:25 PM  Result Value Ref Range Status   Specimen Description   Final    BLOOD LEFT ANTECUBITAL Performed at [facility] [address], [place], [place] [address]    Special Requests   Final    BOTTLES DRAWN AEROBIC AND ANAEROBIC Blood Culture adequate volume Performed at [facility] [address] [place], [place] [address]    Culture   Final    NO GROWTH 5 DAYS Performed at [facility], [place] [address] [place], [place] [address]    Report Status 06/11/2017 FINAL  Final  MRSA PCR Screening     Status: Abnormal   Collection Time: 06/06/17  9:16 PM  Result Value Ref Range Status   MRSA by PCR POSITIVE (A) NEGATIVE Final    Comment:        The GeneXpert MRSA Assay (FDA approved for NASAL specimens only), is one component of a comprehensive MRSA colonization surveillance program. It is not intended to diagnose  MRSA infection nor to guide or monitor treatment for MRSA infections. RESULT CALLED TO, READ BACK BY AND VERIFIED WITH: Monia Pouch RN [id number] 06/06/17 A NAVARRO Performed at [facility], [place] [address] [place], [place] [address]   Respiratory Panel by PCR     Status: None   Collection Time: 06/08/17  2:10 PM  Result Value Ref Range Status   Adenovirus NOT DETECTED NOT DETECTED Final   Coronavirus 229E NOT DETECTED NOT DETECTED Final    Coronavirus HKU1 NOT DETECTED NOT DETECTED Final   Coronavirus NL63 NOT DETECTED NOT DETECTED Final   Coronavirus OC43 NOT DETECTED NOT DETECTED Final   Metapneumovirus NOT DETECTED NOT DETECTED Final   Rhinovirus / Enterovirus NOT DETECTED NOT DETECTED Final   Influenza A NOT DETECTED NOT DETECTED Final   Influenza A H1 NOT DETECTED NOT DETECTED Final   Influenza A H1 2009 NOT DETECTED NOT DETECTED Final   Influenza A H3 NOT DETECTED NOT DETECTED Final   Influenza B NOT DETECTED NOT DETECTED Final   Parainfluenza Virus 1 NOT DETECTED NOT DETECTED Final   Parainfluenza Virus 2 NOT DETECTED NOT DETECTED Final   Parainfluenza Virus 3 NOT DETECTED NOT DETECTED Final   Parainfluenza Virus 4 NOT DETECTED NOT DETECTED Final   Respiratory Syncytial Virus NOT DETECTED NOT DETECTED Final   Bordetella pertussis NOT DETECTED NOT DETECTED Final   Chlamydophila pneumoniae NOT DETECTED NOT DETECTED Final   Mycoplasma pneumoniae NOT DETECTED NOT DETECTED Final     Labs: BNP (last 3 results) Recent Labs    06/08/17 0418  BNP 809.9*   Basic Metabolic Panel: Recent Labs  Lab 06/08/17 0418 06/09/17 0500 06/10/17 0500 06/11/17 0418 06/12/17 0153  NA 132* 135 134* 132* 137  K 3.9 3.9 3.6 4.5 4.8  CL 103 102 104 102 106  CO2 19* 20* 21* 19* 19*  GLUCOSE 137* 208* 197* 286* 196*  BUN 18 29* 32* 33* 32*  CREATININE 1.19 1.33* 1.01 1.22 1.05  CALCIUM 6.9* 7.5* 6.8* 7.5* 8.0*  MG 0.6* 1.9 1.8 2.0 1.8   Liver Function Tests: Recent Labs  Lab 06/06/17 1117 06/11/17 0418 06/12/17 0153  AST 27 43* 44*  ALT 17 26 27   ALKPHOS 97 65 68  BILITOT 0.7 0.5 0.5  PROT 7.8 6.5 6.4*  ALBUMIN 3.2* 2.6* 2.9*   No results for input(s): LIPASE, AMYLASE in the last 168 hours. No results for input(s): AMMONIA in the last 168 hours. CBC: Recent Labs  Lab 06/06/17 1117  06/07/17 0607 06/08/17 0418 06/09/17 0500 06/10/17 0500 06/11/17 0418 06/12/17 0153  WBC 10.2   < > 7.1 6.1 4.4 7.7 9.1  12.6*  NEUTROABS 7.6*  --  5.4  --   --   --   --   --   HGB 11.1*   < > 9.1* 8.1* 8.0* 7.1* 7.5* 10.0*  HCT 33.2*   < > 26.6* 23.6* 23.6* 20.4* 22.1* 29.9*  MCV 97.9   < > 95.3 94.8 92.9 94.0 96.9 94.6  PLT 158   < > 100* 84* 84* 76* 74* 94*   < > = values in this interval not displayed.   Cardiac Enzymes: Recent Labs  Lab 06/08/17 1206 06/08/17 2020 06/09/17 0400  TROPONINI 0.05* 0.03* <0.03   BNP: Invalid input(s): POCBNP CBG: Recent Labs  Lab 06/11/17 1225 06/11/17 1651 06/11/17 2244 06/12/17 0802 06/12/17 1138  GLUCAP 204* 286* 121* 208* 243*   D-Dimer No results for input(s): DDIMER in the  last 72 hours. Hgb A1c No results for input(s): HGBA1C in the last 72 hours. Lipid Profile No results for input(s): CHOL, HDL, LDLCALC, TRIG, CHOLHDL, LDLDIRECT in the last 72 hours. Thyroid function studies No results for input(s): TSH, T4TOTAL, T3FREE, THYROIDAB in the last 72 hours.  Invalid input(s): FREET3 Anemia work up No results for input(s): VITAMINB12, FOLATE, FERRITIN, TIBC, IRON, RETICCTPCT in the last 72 hours. Urinalysis    Component Value Date/Time   COLORURINE YELLOW 06/06/2017 1508   APPEARANCEUR CLEAR 06/06/2017 1508   LABSPEC 1.021 06/06/2017 1508   PHURINE 5.0 06/06/2017 1508   GLUCOSEU NEGATIVE 06/06/2017 1508   HGBUR SMALL (A) 06/06/2017 1508   BILIRUBINUR NEGATIVE 06/06/2017 1508   KETONESUR NEGATIVE 06/06/2017 1508   PROTEINUR 100 (A) 06/06/2017 1508   UROBILINOGEN 0.2 07/06/2011 1221   NITRITE NEGATIVE 06/06/2017 1508   LEUKOCYTESUR NEGATIVE 06/06/2017 1508   Sepsis Labs Invalid input(s): PROCALCITONIN,  WBC,  LACTICIDVEN Microbiology Recent Results (from the past 240 hour(s))  Urine Culture     Status: Abnormal   Collection Time: 06/06/17 12:30 PM  Result Value Ref Range Status   Specimen Description   Final    URINE, CLEAN CATCH Performed at [facility], [place] [address], [place], [place] [address]    Special  Requests   Final    NONE Performed at [facility], [place] [address], [place], [place] [address]    Culture (A)  Final    <10,000 COLONIES/mL INSIGNIFICANT GROWTH Performed at [place] [facility] [address] [place], [place] [address]    Report Status 06/09/2017 FINAL  Final  Urine culture     Status: None   Collection Time: 06/06/17  3:09 PM  Result Value Ref Range Status   Specimen Description   Final    URINE, CLEAN CATCH Performed at [facility], [place] [address] [place], [place] [address]    Special Requests   Final    Normal Performed at [facility], [place] [address], [place], [place] [address]    Culture   Final    NO GROWTH Performed at [facility], [place] [address], [place], [place] [address]    Report Status 06/07/2017 FINAL  Final  Blood culture (routine x 2)     Status: None   Collection Time: 06/06/17  3:10 PM  Result Value Ref Range Status   Specimen Description   Final    BLOOD RIGHT PORTA CATH Performed at [place] [address] [place], [place] [address]    Special Requests   Final    BOTTLES DRAWN AEROBIC AND ANAEROBIC Blood Culture adequate volume Performed at [facility] [address], [place], [place] [address]    Culture   Final    NO GROWTH 5 DAYS Performed at [facility], [place] [address] [place], [place] [address]    Report Status 06/11/2017 FINAL  Final  Blood culture (routine x 2)     Status: None   Collection Time: 06/06/17  5:25 PM  Result Value Ref Range Status   Specimen Description   Final    BLOOD LEFT ANTECUBITAL Performed at [facility] [address], [place], [place] [address]    Special Requests   Final    BOTTLES DRAWN AEROBIC AND ANAEROBIC Blood Culture adequate volume Performed at [place] [address], [place], [place] [address]  Culture   Final    NO  GROWTH 5 DAYS Performed at [facility], [place] [address], [place], [place] [address]    Report Status 06/11/2017 FINAL  Final  MRSA PCR Screening     Status: Abnormal   Collection Time: 06/06/17  9:16 PM  Result Value Ref Range Status   MRSA by PCR POSITIVE (A) NEGATIVE Final    Comment:        The GeneXpert MRSA Assay (FDA approved for NASAL specimens only), is one component of a comprehensive MRSA colonization surveillance program. It is not intended to diagnose MRSA infection nor to guide or monitor treatment for MRSA infections. RESULT CALLED TO, READ BACK BY AND VERIFIED WITH: Monia Pouch RN [id number] 06/06/17 A NAVARRO Performed at [facility], [place] [address] [place], [place] [address]   Respiratory Panel by PCR     Status: None   Collection Time: 06/08/17  2:10 PM  Result Value Ref Range Status   Adenovirus NOT DETECTED NOT DETECTED Final   Coronavirus 229E NOT DETECTED NOT DETECTED Final   Coronavirus HKU1 NOT DETECTED NOT DETECTED Final   Coronavirus NL63 NOT DETECTED NOT DETECTED Final   Coronavirus OC43 NOT DETECTED NOT DETECTED Final   Metapneumovirus NOT DETECTED NOT DETECTED Final   Rhinovirus / Enterovirus NOT DETECTED NOT DETECTED Final   Influenza A NOT DETECTED NOT DETECTED Final   Influenza A H1 NOT DETECTED NOT DETECTED Final   Influenza A H1 2009 NOT DETECTED NOT DETECTED Final   Influenza A H3 NOT DETECTED NOT DETECTED Final   Influenza B NOT DETECTED NOT DETECTED Final   Parainfluenza Virus 1 NOT DETECTED NOT DETECTED Final   Parainfluenza Virus 2 NOT DETECTED NOT DETECTED Final   Parainfluenza Virus 3 NOT DETECTED NOT DETECTED Final   Parainfluenza Virus 4 NOT DETECTED NOT DETECTED Final   Respiratory Syncytial Virus NOT DETECTED NOT DETECTED Final   Bordetella pertussis NOT DETECTED NOT DETECTED Final   Chlamydophila pneumoniae NOT DETECTED NOT DETECTED Final   Mycoplasma pneumoniae NOT DETECTED NOT DETECTED Final     Time  coordinating discharge: Over 30 minutes  SIGNED:   Damita Lack, MD  [facility] 06/12/2017, 1:33 PM Pager   If 7PM-7AM, please contact night-coverage [email or web address] Password [email or web address]

## 2017-06-12 NOTE — Care Management Important Message (Signed)
Important Message  Patient Details IM Letter given to Kathy/Case Manager to present to the Patient Name: Mark Howell MRN: [id number] Date of Birth: June 08, 1940   Medicare Important Message Given:  Yes    Kerin Salen 06/12/2017, 11:29 AMImportant Message  Patient Details  Name: Mark Howell MRN: [id number] Date of Birth: February 04, 1941   Medicare Important Message Given:  Yes    Kerin Salen 06/12/2017, 11:29 AM

## 2017-06-12 NOTE — Progress Notes (Signed)
Reviewed patient's discharge paperwork.  Explained in detail about how and when to take his medications, reviewed how to check a blood sugar with a CBG machine, explained the importance of exercising and monitoring carbohydrates.  I also explained to the patient about sick days and what to do if patient is sick along with a handout regarding this.  I also reviewed hypoglycemia and what to do if this occurs.  Patient has living with diabetes book and will be reviewing it more and detail and understood the importance of all of this.  No other questions were answered by patient or patient's wife.  Patient is ready for d/c.

## 2017-06-12 NOTE — Telephone Encounter (Signed)
Received call from wife stating that pt was just discharged from the hospital today.  Wife would like to know when pt can see Dr. Julien Nordmann,  and to reschedule missed chemo appt.

## 2017-06-12 NOTE — Progress Notes (Signed)
Removed nasal packing per MD verbal order.  Assessed patient left nare with flash light and there were no signs of bleeding. RN instructed patient and wife to inform RN if any signs of bleeding occur.

## 2017-06-12 NOTE — Progress Notes (Signed)
Inpatient Diabetes Program Recommendations  AACE/ADA: New Consensus Statement on Inpatient Glycemic Control (2015)  Target Ranges:  Prepandial:   less than 140 mg/dL      Peak postprandial:   less than 180 mg/dL (1-2 hours)      Critically ill patients:  140 - 180 mg/dL   Lab Results  Component Value Date   GLUCAP 243 (H) 06/12/2017   HGBA1C 7.4 (H) 06/06/2017    Review of Glycemic Control  Diabetes history: DM2 - newly diagnosed Outpatient Diabetes medications: None Current orders for Inpatient glycemic control: glipizide 10 mg QAM, Novolog 0-15 units tidwc  Long discussion with pt and wife about new diagnosis of DM. Discussed diet, exercise, glucose monitoring, hypoglycemia, and taking meds. Pt's wife asked about the CGM Elenor Legato and was interested in getting a prescription before leaving hospital. Her daughter owns a pharmacy in [place] and pt to be given a savings card for the [place].  Wife has Living Well With Diabetes book at home. Pt and wife voiced good understanding.  Discussed above with RN.  [facility] number for orders: [id number] Dispense: 2 Comment/Note: 14 day reader and 14 day sensor  Thank you. Lorenda Peck, RD, LDN, CDE Inpatient Diabetes Coordinator [phone]

## 2017-06-12 NOTE — Care Management Note (Signed)
Case Management Note  Patient Details  Name: Mark Howell MRN: [id number] Date of Birth: 1941/03/02  Subjective/Objective:  [facility] already following for HHC-HHPT/HHOT-HRI. No further CM needs.                  Action/Plan:d/c home w/HHC   Expected Discharge Date:                  Expected Discharge Plan:  [place]  In-House Referral:     Discharge planning Services  CM Consult  Post Acute Care Choice:  Home Health(rw;active w/[facility] HHPT) Choice offered to:     DME Arranged:    DME Agency:     HH Arranged:  PT, OT HH Agency:  [facility]  Status of Service:  Completed, signed off  If discussed at [place] of Stay Meetings, dates discussed:    Additional Comments:  Dessa Phi, RN 06/12/2017, 12:26 PM

## 2017-06-14 ENCOUNTER — Telehealth: Payer: Self-pay | Admitting: Internal Medicine

## 2017-06-14 NOTE — Telephone Encounter (Signed)
Scheduled appt per 2/11 sch message - patient did not want to schedule for next week , scheduled for the following week . Pt is aware of appt date and time.

## 2017-06-18 ENCOUNTER — Ambulatory Visit: Payer: Medicare Other | Admitting: Oncology

## 2017-06-18 ENCOUNTER — Other Ambulatory Visit: Payer: Medicare Other

## 2017-06-18 ENCOUNTER — Ambulatory Visit: Payer: Medicare Other

## 2017-06-25 ENCOUNTER — Other Ambulatory Visit: Payer: Self-pay

## 2017-06-25 DIAGNOSIS — C3491 Malignant neoplasm of unspecified part of right bronchus or lung: Secondary | ICD-10-CM

## 2017-06-26 ENCOUNTER — Inpatient Hospital Stay: Payer: Medicare Other

## 2017-06-26 ENCOUNTER — Encounter: Payer: Self-pay | Admitting: Oncology

## 2017-06-26 ENCOUNTER — Telehealth: Payer: Self-pay

## 2017-06-26 ENCOUNTER — Inpatient Hospital Stay (HOSPITAL_BASED_OUTPATIENT_CLINIC_OR_DEPARTMENT_OTHER): Payer: Medicare Other | Admitting: Oncology

## 2017-06-26 ENCOUNTER — Other Ambulatory Visit: Payer: Self-pay | Admitting: Medical Oncology

## 2017-06-26 VITALS — BP 125/74 | HR 108 | Temp 97.6°F | Resp 18 | Ht 73.0 in | Wt 161.0 lb

## 2017-06-26 DIAGNOSIS — C7972 Secondary malignant neoplasm of left adrenal gland: Secondary | ICD-10-CM | POA: Diagnosis not present

## 2017-06-26 DIAGNOSIS — Z5112 Encounter for antineoplastic immunotherapy: Secondary | ICD-10-CM | POA: Diagnosis present

## 2017-06-26 DIAGNOSIS — C7971 Secondary malignant neoplasm of right adrenal gland: Secondary | ICD-10-CM | POA: Diagnosis not present

## 2017-06-26 DIAGNOSIS — Z95828 Presence of other vascular implants and grafts: Secondary | ICD-10-CM

## 2017-06-26 DIAGNOSIS — C3401 Malignant neoplasm of right main bronchus: Secondary | ICD-10-CM

## 2017-06-26 DIAGNOSIS — Z5111 Encounter for antineoplastic chemotherapy: Secondary | ICD-10-CM | POA: Diagnosis not present

## 2017-06-26 DIAGNOSIS — Z923 Personal history of irradiation: Secondary | ICD-10-CM | POA: Diagnosis not present

## 2017-06-26 DIAGNOSIS — C3491 Malignant neoplasm of unspecified part of right bronchus or lung: Secondary | ICD-10-CM

## 2017-06-26 DIAGNOSIS — L4050 Arthropathic psoriasis, unspecified: Secondary | ICD-10-CM | POA: Diagnosis not present

## 2017-06-26 DIAGNOSIS — E119 Type 2 diabetes mellitus without complications: Secondary | ICD-10-CM | POA: Diagnosis not present

## 2017-06-26 DIAGNOSIS — Z79899 Other long term (current) drug therapy: Secondary | ICD-10-CM | POA: Diagnosis not present

## 2017-06-26 DIAGNOSIS — Z7952 Long term (current) use of systemic steroids: Secondary | ICD-10-CM | POA: Diagnosis not present

## 2017-06-26 DIAGNOSIS — I700 Atherosclerosis of aorta: Secondary | ICD-10-CM | POA: Diagnosis not present

## 2017-06-26 LAB — CMP (CANCER CENTER ONLY)
ALBUMIN: 3.4 g/dL — AB (ref 3.5–5.0)
ALT: 27 U/L (ref 0–55)
AST: 32 U/L (ref 5–34)
Alkaline Phosphatase: 88 U/L (ref 40–150)
Anion gap: 15 — ABNORMAL HIGH (ref 3–11)
BUN: 23 mg/dL (ref 7–26)
CHLORIDE: 100 mmol/L (ref 98–109)
CO2: 22 mmol/L (ref 22–29)
CREATININE: 1.19 mg/dL (ref 0.70–1.30)
Calcium: 8.8 mg/dL (ref 8.4–10.4)
GFR, EST NON AFRICAN AMERICAN: 57 mL/min — AB (ref >60)
GFR, Est AFR Am: >60 mL/min (ref >60)
GLUCOSE: 167 mg/dL — AB (ref 70–140)
Potassium: 5.1 mmol/L (ref 3.5–5.1)
SODIUM: 137 mmol/L (ref 136–145)
Total Bilirubin: 0.6 mg/dL (ref 0.2–1.2)
Total Protein: 7.9 g/dL (ref 6.4–8.3)

## 2017-06-26 LAB — CBC WITH DIFFERENTIAL (CANCER CENTER ONLY)
Basophils Absolute: 0 10*3/uL (ref 0.0–0.1)
Basophils Relative: 0 %
EOS ABS: 0.1 10*3/uL (ref 0.0–0.5)
EOS PCT: 1 %
HCT: 37.4 % — ABNORMAL LOW (ref 38.4–49.9)
Hemoglobin: 11.9 g/dL — ABNORMAL LOW (ref 13.0–17.1)
LYMPHS ABS: 1.1 10*3/uL (ref 0.9–3.3)
LYMPHS PCT: 9 %
MCH: 30.4 pg (ref 27.2–33.4)
MCHC: 31.8 g/dL — AB (ref 32.0–36.0)
MCV: 95.7 fL (ref 79.3–98.0)
MONO ABS: 0.7 10*3/uL (ref 0.1–0.9)
MONOS PCT: 6 %
Neutro Abs: 10.2 10*3/uL — ABNORMAL HIGH (ref 1.5–6.5)
Neutrophils Relative %: 84 %
PLATELETS: 148 10*3/uL (ref 140–400)
RBC: 3.91 MIL/uL — ABNORMAL LOW (ref 4.20–5.82)
RDW: 18.8 % — ABNORMAL HIGH (ref 11.0–14.6)
WBC Count: 12.1 10*3/uL — ABNORMAL HIGH (ref 4.0–10.3)

## 2017-06-26 LAB — TOTAL PROTEIN, URINE DIPSTICK: Protein, ur: 30 mg/dL — AB

## 2017-06-26 MED ORDER — PROCHLORPERAZINE MALEATE 10 MG PO TABS
ORAL_TABLET | ORAL | Status: AC
Start: 2017-06-26 — End: ?
  Filled 2017-06-26: qty 1

## 2017-06-26 MED ORDER — HEPARIN SOD (PORK) LOCK FLUSH 100 UNIT/ML IV SOLN
500.0000 [IU] | Freq: Once | INTRAVENOUS | Status: AC | PRN
  Administered 2017-06-26: 500 [IU]
  Filled 2017-06-26: qty 5

## 2017-06-26 MED ORDER — PROCHLORPERAZINE MALEATE 10 MG PO TABS
10.0000 mg | ORAL_TABLET | Freq: Once | ORAL | Status: AC
  Administered 2017-06-26: 10 mg via ORAL

## 2017-06-26 MED ORDER — SODIUM CHLORIDE 0.9 % IV SOLN
410.0000 mg/m2 | Freq: Once | INTRAVENOUS | Status: AC
  Administered 2017-06-26: 800 mg via INTRAVENOUS
  Filled 2017-06-26: qty 20

## 2017-06-26 MED ORDER — CYANOCOBALAMIN 1000 MCG/ML IJ SOLN
INTRAMUSCULAR | Status: AC
  Filled 2017-06-26: qty 1

## 2017-06-26 MED ORDER — SODIUM CHLORIDE 0.9% FLUSH
10.0000 mL | Freq: Once | INTRAVENOUS | Status: AC
  Administered 2017-06-26: 10 mL
  Filled 2017-06-26: qty 10

## 2017-06-26 MED ORDER — CYANOCOBALAMIN 1000 MCG/ML IJ SOLN
1000.0000 ug | Freq: Once | INTRAMUSCULAR | Status: AC
  Administered 2017-06-26: 1000 ug via INTRAMUSCULAR

## 2017-06-26 MED ORDER — SODIUM CHLORIDE 0.9% FLUSH
10.0000 mL | INTRAVENOUS | Status: DC | PRN
  Administered 2017-06-26: 10 mL
  Filled 2017-06-26: qty 10

## 2017-06-26 MED ORDER — SODIUM CHLORIDE 0.9 % IV SOLN
16.0000 mg/kg | Freq: Once | INTRAVENOUS | Status: AC
  Administered 2017-06-26: 1200 mg via INTRAVENOUS
  Filled 2017-06-26: qty 48

## 2017-06-26 MED ORDER — SODIUM CHLORIDE 0.9 % IV SOLN
Freq: Once | INTRAVENOUS | Status: AC
  Administered 2017-06-26: 15:00:00 via INTRAVENOUS

## 2017-06-26 NOTE — Patient Instructions (Signed)
[facility] Discharge Instructions for Patients Receiving Chemotherapy  Today you received the following chemotherapy agents AVASTIN and ALIMTA  To help prevent nausea and vomiting after your treatment, we encourage you to take your nausea medication as prescribed by MD   If you develop nausea and vomiting that is not controlled by your nausea medication, call the clinic.   BELOW ARE SYMPTOMS THAT SHOULD BE REPORTED IMMEDIATELY:  *FEVER GREATER THAN 100.5 F  *CHILLS WITH OR WITHOUT FEVER  NAUSEA AND VOMITING THAT IS NOT CONTROLLED WITH YOUR NAUSEA MEDICATION  *UNUSUAL SHORTNESS OF BREATH  *UNUSUAL BRUISING OR BLEEDING  TENDERNESS IN MOUTH AND THROAT WITH OR WITHOUT PRESENCE OF ULCERS  *URINARY PROBLEMS  *BOWEL PROBLEMS  UNUSUAL RASH Items with * indicate a potential emergency and should be followed up as soon as possible.  Feel free to call the clinic should you have any questions or concerns. The clinic phone number is [phone].  Please show the [facility] at check-in to the Emergency Department and triage nurse.   Bevacizumab injection What is this medicine? BEVACIZUMAB (be va SIZ yoo mab) is a monoclonal antibody. It is used to treat many types of cancer. This medicine may be used for other purposes; ask your health care provider or pharmacist if you have questions. COMMON BRAND NAME(S): Avastin What should I tell my health care provider before I take this medicine? They need to know if you have any of these conditions: -diabetes -heart disease -high blood pressure -history of coughing up blood -prior anthracycline chemotherapy (e.g., doxorubicin, daunorubicin, epirubicin) -recent or ongoing radiation therapy -recent or planning to have surgery -stroke -an unusual or allergic reaction to bevacizumab, hamster proteins, mouse proteins, other medicines, foods, dyes, or preservatives -pregnant or trying to get pregnant -breast-feeding How  should I use this medicine? This medicine is for infusion into a vein. It is given by a health care professional in a hospital or clinic setting. Talk to your pediatrician regarding the use of this medicine in children. Special care may be needed. Overdosage: If you think you have taken too much of this medicine contact a poison control center or emergency room at once. NOTE: This medicine is only for you. Do not share this medicine with others. What if I miss a dose? It is important not to miss your dose. Call your doctor or health care professional if you are unable to keep an appointment. What may interact with this medicine? Interactions are not expected. This list may not describe all possible interactions. Give your health care provider a list of all the medicines, herbs, non-prescription drugs, or dietary supplements you use. Also tell them if you smoke, drink alcohol, or use illegal drugs. Some items may interact with your medicine. What should I watch for while using this medicine? Your condition will be monitored carefully while you are receiving this medicine. You will need important blood work and urine testing done while you are taking this medicine. This medicine may increase your risk to bruise or bleed. Call your doctor or health care professional if you notice any unusual bleeding. This medicine should be started at least 28 days following major surgery and the site of the surgery should be totally healed. Check with your doctor before scheduling dental work or surgery while you are receiving this treatment. Talk to your doctor if you have recently had surgery or if you have a wound that has not healed. Do not become pregnant while taking this  medicine or for 6 months after stopping it. Women should inform their doctor if they wish to become pregnant or think they might be pregnant. There is a potential for serious side effects to an unborn child. Talk to your health care professional  or pharmacist for more information. Do not breast-feed an infant while taking this medicine and for 6 months after the last dose. This medicine has caused ovarian failure in some women. This medicine may interfere with the ability to have a child. You should talk to your doctor or health care professional if you are concerned about your fertility. What side effects may I notice from receiving this medicine? Side effects that you should report to your doctor or health care professional as soon as possible: -allergic reactions like skin rash, itching or hives, swelling of the face, lips, or tongue -chest pain or chest tightness -chills -coughing up blood -high fever -seizures -severe constipation -signs and symptoms of bleeding such as bloody or black, tarry stools; red or dark-brown urine; spitting up blood or brown material that looks like coffee grounds; red spots on the skin; unusual bruising or bleeding from the eye, gums, or nose -signs and symptoms of a blood clot such as breathing problems; chest pain; severe, sudden headache; pain, swelling, warmth in the leg -signs and symptoms of a stroke like changes in vision; confusion; trouble speaking or understanding; severe headaches; sudden numbness or weakness of the face, arm or leg; trouble walking; dizziness; loss of balance or coordination -stomach pain -sweating -swelling of legs or ankles -vomiting -weight gain Side effects that usually do not require medical attention (report to your doctor or health care professional if they continue or are bothersome): -back pain -changes in taste -decreased appetite -dry skin -nausea -tiredness This list may not describe all possible side effects. Call your doctor for medical advice about side effects. You may report side effects to FDA at 1-800-FDA-1088. Where should I keep my medicine? This drug is given in a hospital or clinic and will not be stored at home. NOTE: This sheet is a summary.  It may not cover all possible information. If you have questions about this medicine, talk to your doctor, pharmacist, or health care provider.  2018 Elsevier/Gold Standard (2016-04-14 14:33:29)   Pemetrexed injection What is this medicine? PEMETREXED (PEM e TREX ed) is a chemotherapy drug used to treat lung cancers like non-small cell lung cancer and mesothelioma. It may also be used to treat other cancers. This medicine may be used for other purposes; ask your health care provider or pharmacist if you have questions. COMMON BRAND NAME(S): Alimta What should I tell my health care provider before I take this medicine? They need to know if you have any of these conditions: -infection (especially a virus infection such as chickenpox, cold sores, or herpes) -kidney disease -low blood counts, like low white cell, platelet, or red cell counts -lung or breathing disease, like asthma -radiation therapy -an unusual or allergic reaction to pemetrexed, other medicines, foods, dyes, or preservative -pregnant or trying to get pregnant -breast-feeding How should I use this medicine? This drug is given as an infusion into a vein. It is administered in a hospital or clinic by a specially trained health care professional. Talk to your pediatrician regarding the use of this medicine in children. Special care may be needed. Overdosage: If you think you have taken too much of this medicine contact a poison control center or emergency room at once. NOTE: This medicine  is only for you. Do not share this medicine with others. What if I miss a dose? It is important not to miss your dose. Call your doctor or health care professional if you are unable to keep an appointment. What may interact with this medicine? This medicine may interact with the following medications: -Ibuprofen This list may not describe all possible interactions. Give your health care provider a list of all the medicines, herbs,  non-prescription drugs, or dietary supplements you use. Also tell them if you smoke, drink alcohol, or use illegal drugs. Some items may interact with your medicine. What should I watch for while using this medicine? Visit your doctor for checks on your progress. This drug may make you feel generally unwell. This is not uncommon, as chemotherapy can affect healthy cells as well as cancer cells. Report any side effects. Continue your course of treatment even though you feel ill unless your doctor tells you to stop. In some cases, you may be given additional medicines to help with side effects. Follow all directions for their use. Call your doctor or health care professional for advice if you get a fever, chills or sore throat, or other symptoms of a cold or flu. Do not treat yourself. This drug decreases your body's ability to fight infections. Try to avoid being around people who are sick. This medicine may increase your risk to bruise or bleed. Call your doctor or health care professional if you notice any unusual bleeding. Be careful brushing and flossing your teeth or using a toothpick because you may get an infection or bleed more easily. If you have any dental work done, tell your dentist you are receiving this medicine. Avoid taking products that contain aspirin, acetaminophen, ibuprofen, naproxen, or ketoprofen unless instructed by your doctor. These medicines may hide a fever. Call your doctor or health care professional if you get diarrhea or mouth sores. Do not treat yourself. To protect your kidneys, drink water or other fluids as directed while you are taking this medicine. Do not become pregnant while taking this medicine or for 6 months after stopping it. Women should inform their doctor if they wish to become pregnant or think they might be pregnant. Men should not father a child while taking this medicine and for 3 months after stopping it. This may interfere with the ability to father a  child. You should talk to your doctor or health care professional if you are concerned about your fertility. There is a potential for serious side effects to an unborn child. Talk to your health care professional or pharmacist for more information. Do not breast-feed an infant while taking this medicine or for 1 week after stopping it. What side effects may I notice from receiving this medicine? Side effects that you should report to your doctor or health care professional as soon as possible: -allergic reactions like skin rash, itching or hives, swelling of the face, lips, or tongue -breathing problems -redness, blistering, peeling or loosening of the skin, including inside the mouth -signs and symptoms of bleeding such as bloody or black, tarry stools; red or dark-brown urine; spitting up blood or brown material that looks like coffee grounds; red spots on the skin; unusual bruising or bleeding from the eye, gums, or nose -signs and symptoms of infection like fever or chills; cough; sore throat; pain or trouble passing urine -signs and symptoms of kidney injury like trouble passing urine or change in the amount of urine -signs and symptoms of liver  injury like dark yellow or brown urine; general ill feeling or flu-like symptoms; light-colored stools; loss of appetite; nausea; right upper belly pain; unusually weak or tired; yellowing of the eyes or skin Side effects that usually do not require medical attention (report to your doctor or health care professional if they continue or are bothersome): -constipation -dizziness -mouth sores -nausea, vomiting -pain, tingling, numbness in the hands or feet -unusually weak or tired This list may not describe all possible side effects. Call your doctor for medical advice about side effects. You may report side effects to FDA at 1-800-FDA-1088. Where should I keep my medicine? This drug is given in a hospital or clinic and will not be stored at  home. NOTE: This sheet is a summary. It may not cover all possible information. If you have questions about this medicine, talk to your doctor, pharmacist, or health care provider.  2018 Elsevier/Gold Standard (2016-02-15 18:51:46)

## 2017-06-26 NOTE — Telephone Encounter (Signed)
Printed avs and calender of upcoming appointment.  Per 2/26 los

## 2017-06-26 NOTE — Progress Notes (Signed)
[facility] OFFICE PROGRESS NOTE  [name], MD [place] [address]  DIAGNOSIS: Metastatic non-small cell lung cancer, adenocarcinoma initially diagnosed as Unresectable stage IIA (T1a, N1, M0) non-small cell lung cancer, adenocarcinoma presented with a right hilar mass and right hilar adenopathy diagnosed in September 2017. [facility] 360: Negative for EGFR, ALK, ROS 1 and BRAF mutations.  PRIOR THERAPY: 1) Status post concurrent chemoradiation with weekly carboplatin for AUC of 2 and paclitaxel 45 MG/M2 for 7 weeks. 2) Systemic chemotherapy with carboplatin for AUC of 5, Alimta 500 MG/M2 and Avastin 15 MG/KG every 3 weeks. First dose 01/08/2017. Status post 6 cycles.  Last dose was given April 30, 2017 stable disease.  CURRENT THERAPY: Maintenance treatment with Alimta 400 mg/M2 and Avastin 15 mg/KG every 3 weeks.  Alimta has been dose reduced due to ongoing fatigue weakness. First dose 06/26/2017.  INTERVAL HISTORY: Mark Howell 77 y.o. male returns for a routine follow-up visit accompanied by his wife.  The patient continues to complain of fatigue and weakness.  His fatigue and weakness have not really improved since being off chemotherapy.  He was recently hospitalized at [facility] for new diagnosis of diabetes, pneumonia, and dehydration.  He has completed his antibiotics.  The patient denies fevers and chills.  Denies chest pain but reports shortness of breath with minimal exertion.  Denies cough or hemoptysis.  Denies nausea, vomiting, constipation, diarrhea.  He reports that his appetite is fair but that his weight is overall stable.  He has not had any worsening of his pain due to his rheumatoid arthritis.  He has had multiple delays in starting his maintenance therapy.  The patient is here for evaluation prior to cycle 1 of his maintenance Alimta and Avastin.  MEDICAL HISTORY: Past Medical History:  Diagnosis Date  . Arthritis   .  Complication of anesthesia    problems voiding post op  . Full dentures   . GERD (gastroesophageal reflux disease)   . History of radiation therapy 01/25/16-02/28/16   right lung 45 Gy  . History of radiation therapy 04/17/16-05/02/16   right lung boost 20 Gy in 10 fractions cumulative dose 65 gray  . HOH (hard of hearing)   . HTN (hypertension)   . Hyperlipidemia   . Lung mass 12/30/2015  . Odynophagia 04/06/2016  . Psoriatic arthritis ([place])   . Snores   . Wears glasses     ALLERGIES:  is allergic to hydrocodone and oxycodone.  MEDICATIONS:  Current Outpatient Medications  Medication Sig Dispense Refill  . glipiZIDE (GLUCOTROL) 10 MG tablet Take 10 mg by mouth daily before breakfast.    . mirtazapine (REMERON) 15 MG tablet Take 15 mg by mouth at bedtime.    Marland Kitchen albuterol (VENTOLIN HFA) 108 (90 Base) MCG/ACT inhaler Inhale 2 puffs into the lungs every 6 (six) hours as needed for wheezing or shortness of breath.     Marland Kitchen aspirin EC 81 MG tablet Take 81 mg by mouth at bedtime.     . blood glucose meter kit and supplies KIT Dispense based on patient and insurance preference. Use up to four times daily as directed. (FOR ICD-9 250.00, 250.01). 1 each 0  . calcium carbonate (TUMS - DOSED IN MG ELEMENTAL CALCIUM) 500 MG chewable tablet Chew 1 tablet by mouth daily.    . Cetirizine HCl 10 MG TBDP Take 1 tablet by mouth daily.     . Coenzyme Q10 (CO Q 10 PO) Take 1  tablet by mouth daily.    . Continuous Blood Gluc Sensor MISC 1 each by Does not apply route as directed. Use as directed every 10 days. May dispense FreeStyle [employer] or similar. 2 each 2  . dexamethasone (DECADRON) 4 MG tablet 4 mg by mouth twice a day the day before, day of and day after chemotherapy every 3 weeks. 40 tablet 1  . Dextromethorphan-Guaifenesin (DELSYM COUGH/CHEST CONGEST DM PO) Take 30 mLs by mouth daily as needed (cold symptoms).    . diphenhydramine-acetaminophen (TYLENOL PM) 25-500 MG TABS tablet Take 1  tablet by mouth at bedtime as needed (sleep).    . diphenoxylate-atropine (LOMOTIL) 2.5-0.025 MG tablet Take 2 tablets by mouth 4 (four) times daily as needed for diarrhea or loose stools. 40 tablet 1  . fluticasone-salmeterol (ADVAIR HFA) 115-21 MCG/ACT inhaler Inhale 2 puffs into the lungs 2 (two) times daily. 1 Inhaler 12  . folic acid (FOLVITE) 1 MG tablet Take 1 tablet (1 mg total) by mouth daily. 30 tablet 0  . glipiZIDE (GLUCOTROL) 10 MG tablet Take 1 tablet (10 mg total) by mouth daily before breakfast. (Patient taking differently: Take 10 mg by mouth daily before breakfast. ) 30 tablet 1  . guaiFENesin (MUCINEX) 600 MG 12 hr tablet Take 1 tablet (600 mg total) by mouth 2 (two) times daily. 60 tablet 0  . lidocaine (XYLOCAINE) 2 % solution Use as directed 20 mLs in the mouth or throat every 3 (three) hours as needed for mouth pain. 100 mL 2  . lidocaine-prilocaine (EMLA) cream Apply 1 application topically as needed. (Patient taking differently: Apply 1 application topically as needed (port access). ) 30 g 0  . loperamide (IMODIUM A-D) 2 MG tablet Take 2 mg by mouth daily as needed for diarrhea or loose stools.    Marland Kitchen LORazepam (ATIVAN) 0.5 MG tablet Take 1 tablet (0.5 mg total) by mouth 2 (two) times daily as needed for anxiety. 30 tablet 0  . Lutein 10 MG TABS Take 10 mg by mouth at bedtime.     . magic mouthwash SOLN Take 5 mLs by mouth 4 (four) times daily as needed for mouth pain. (Patient taking differently: Take 5 mLs by mouth 4 (four) times daily as needed for mouth pain. SWISH AND SPIT OUT) 240 mL 2  . Methylcobalamin (METHYL B-12 PO) Take 1 tablet by mouth daily.    . metoprolol tartrate (LOPRESSOR) 25 MG tablet Take 0.5 tablets (12.5 mg total) by mouth 2 (two) times daily. 60 tablet 0  . Multiple Vitamin (MULITIVITAMIN WITH MINERALS) TABS Take 1 tablet by mouth daily.    . pantoprazole (PROTONIX) 40 MG tablet Take 40 mg by mouth daily.     . pravastatin (PRAVACHOL) 40 MG tablet Take  40 mg by mouth at bedtime.     . predniSONE (DELTASONE) 20 MG tablet Take 1 tablet (20 mg total) by mouth daily. 30 tablet 0  . prochlorperazine (COMPAZINE) 10 MG tablet Take 1 tablet (10 mg total) by mouth every 6 (six) hours as needed for nausea or vomiting. 30 tablet 0  . tamsulosin (FLOMAX) 0.4 MG CAPS capsule TAKE (1) CAPSULE DAILY, START 4 DAYS BEFORE PROCEDURE (Patient taking differently: take 1 capsule by mouth every morning) 30 capsule 2  . triamcinolone (NASACORT) 55 MCG/ACT AERO nasal inhaler Place 1 spray into the nose at bedtime.      No current facility-administered medications for this visit.     SURGICAL HISTORY:  Past Surgical History:  Procedure Laterality Date  . COLONOSCOPY    . HEMORRHOID SURGERY N/A 10/01/2013   Procedure: EXAM UNDER ANESTHESIA  AND EXCISIONAL HEMORRHOIDECTOMY WITH HEMORRHOID BANDING X 2;  Surgeon: Gayland Curry, MD;  Location: [facility];  Service: General;  Laterality: N/A;  . INGUINAL HERNIA REPAIR  01/04/2012   Procedure: LAPAROSCOPIC INGUINAL HERNIA;  Surgeon: Gayland Curry, MD,FACS;  Location: [facility];  Service: General;  Laterality: Right;  . IR FLUORO GUIDE PORT INSERTION RIGHT  01/02/2017  . IR US GUIDE VASC ACCESS RIGHT  01/02/2017  . VIDEO BRONCHOSCOPY WITH ENDOBRONCHIAL ULTRASOUND N/A 12/31/2015   Procedure: VIDEO BRONCHOSCOPY WITH ENDOBRONCHIAL ULTRASOUND transbronchial biopsy of node 10 R lymph node;  Surgeon: Grace Isaac, MD;  Location: [facility];  Service: Thoracic;  Laterality: N/A;    REVIEW OF SYSTEMS:   Review of Systems  Constitutional: Negative for chills, fever and unexpected weight change. Positive for fatigue and generalized weakness.  He has a decreased appetite. HENT:   Negative for mouth sores, nosebleeds, sore throat and trouble swallowing.   Eyes: Negative for eye problems and icterus.  Respiratory: Negative for cough, hemoptysis, shortness of breath at rest and wheezing.  Positive for dyspnea on exertion.   Cardiovascular: Negative for chest pain and leg swelling.  Gastrointestinal: Negative for abdominal pain, constipation, diarrhea, nausea and vomiting.  Genitourinary: Negative for bladder incontinence, difficulty urinating, dysuria, frequency and hematuria.   Musculoskeletal: Negative for back pain, gait problem, neck pain and neck stiffness.  Skin: Negative for itching and rash.  Neurological: Negative for dizziness, extremity weakness, gait problem, headaches, light-headedness and seizures.  Hematological: Negative for adenopathy. Does not bruise/bleed easily.  Psychiatric/Behavioral: Negative for confusion, depression and sleep disturbance. The patient is not nervous/anxious.     PHYSICAL EXAMINATION:  Blood pressure 125/74, pulse (!) 108, temperature 97.6 F (36.4 C), temperature source Axillary, resp. rate 18, height 6' 1" (1.854 m), weight 161 lb (73 kg), SpO2 98 %.  ECOG PERFORMANCE STATUS: 1 - Symptomatic but completely ambulatory  Physical Exam  Constitutional: Oriented to person, place, and time and well-developed, well-nourished, and in no distress. No distress.  HENT:  Head: Normocephalic and atraumatic.  Mouth/Throat: Oropharynx is clear and moist. No oropharyngeal exudate.  Eyes: Conjunctivae are normal. Right eye exhibits no discharge. Left eye exhibits no discharge. No scleral icterus.  Neck: Normal range of motion. Neck supple.  Cardiovascular: Normal rate, regular rhythm, normal heart sounds and intact distal pulses.   Pulmonary/Chest: Effort normal and breath sounds normal. No respiratory distress. No wheezes. No rales.  Abdominal: Soft. Bowel sounds are normal. Exhibits no distension and no mass. There is no tenderness.  Musculoskeletal: Normal range of motion. Exhibits no edema.  Lymphadenopathy:    No cervical adenopathy.  Neurological: Alert and oriented to person, place, and time. Exhibits normal muscle tone. Gait normal. Coordination normal.  Skin: Skin is  warm and dry. No rash noted. Not diaphoretic. No erythema. No pallor.  Psychiatric: Mood, memory and judgment normal.  Vitals reviewed.  LABORATORY DATA: Lab Results  Component Value Date   WBC 12.1 (H) 06/26/2017   HGB 10.0 (L) 06/12/2017   HCT 37.4 (L) 06/26/2017   MCV 95.7 06/26/2017   PLT 148 06/26/2017      Chemistry      Component Value Date/Time   NA 137 06/26/2017 1216   NA 135 (L) 04/30/2017 0824   K 5.1 06/26/2017 1216   K 4.4 04/30/2017 0824   CL 100 06/26/2017  1216   CO2 22 06/26/2017 1216   CO2 19 (L) 04/30/2017 0824   BUN 23 06/26/2017 1216   BUN 28.0 (H) 04/30/2017 0824   CREATININE 1.19 06/26/2017 1216   CREATININE 1.5 (H) 04/30/2017 0824      Component Value Date/Time   CALCIUM 8.8 06/26/2017 1216   CALCIUM 7.6 (L) 04/30/2017 0824   ALKPHOS 88 06/26/2017 1216   ALKPHOS 88 04/30/2017 0824   AST 32 06/26/2017 1216   AST 22 04/30/2017 0824   ALT 27 06/26/2017 1216   ALT 19 04/30/2017 0824   BILITOT 0.6 06/26/2017 1216   BILITOT 0.57 04/30/2017 0824       RADIOGRAPHIC STUDIES:  Dg Chest 2 View  Result Date: 06/06/2017 CLINICAL DATA:  Known lung carcinoma with shortness of Breath EXAM: CHEST  2 VIEW COMPARISON:  05/09/2017 FINDINGS: Cardiac shadow is within normal limits. Aortic calcifications are again seen. Right chest wall port is noted in satisfactory position. Fullness in the right hilar region is again seen slightly less than that seen on the prior exam consistent with post radiation change. No acute infiltrate is seen. Some scarring is noted in the left lung. No acute bony abnormality is noted. IMPRESSION: No significant change from the prior exam. Post radiation changes on the right are noted. Electronically Signed   By: Inez Catalina M.D.   On: 06/06/2017 14:10   Ct Angio Chest Pe W Or Wo Contrast  Result Date: 06/08/2017 CLINICAL DATA:  77 year old male with history of psoriatic arthritis on chronic prednisone, metastatic lung cancer on  chemotherapy was sent to the hospital due to concerns of polydipsia, polyuria and dehydration. He was noted to likely from dehydration which has improved. Patient was diagnosed with new onset diabetes therefore started on glipizide. His stay was complicated by acute shortness of breath and fever therefore started on antibiotics, placed on BiPAP and had to be diuresed due to positive balance of fluid. Empirically started on DVT/PE treatment given elevated d-dimer, unable to get CTA as he is on BiPAP. EXAM: CT ANGIOGRAPHY CHEST WITH CONTRAST TECHNIQUE: Multidetector CT imaging of the chest was performed using the standard protocol during bolus administration of intravenous contrast. Multiplanar CT image reconstructions and MIPs were obtained to evaluate the vascular anatomy. CONTRAST:  131m ISOVUE-370 IOPAMIDOL (ISOVUE-370) INJECTION 76% COMPARISON:  Current chest radiograph.  Chest CTA, 05/09/2017. FINDINGS: Cardiovascular: Satisfactory opacification of the pulmonary arteries to the segmental level. Study is degraded by motion. Allowing for the motion retention, there is no evidence of a pulmonary embolism. Heart is normal in size and configuration. No pericardial effusion. There are mild coronary artery calcifications. Thoracic aorta is normal in caliber. No dissection. Mild atherosclerotic changes along the arch and descending portion. Mediastinum/Nodes: Normal thyroid gland. No neck base or axillary masses or adenopathy. No mediastinal masses or pathologically enlarged lymph nodes. No left hilar masses or adenopathy. Abnormal soft tissue surrounds the right hilar structures similar to the prior CT, likely radiation induced fibrosis. Tumor is possible. Trachea is widely patent. Mainstem bronchi are widely patent. Esophagus is unremarkable. Lungs/Pleura: Since the prior CT, ground-glass opacities have developed throughout much of the left lower lobe and throughout the posterior inferior aspect of the left upper  lobe. Ground-glass opacities have also developed/increased in a patchy distribution throughout right upper lobe. More confluent type opacities extending anteriorly and superiorly from the soft tissue surrounding the right hilum has increased. New, minimal, right greater than left, pleural effusions. Subpleural 7 mm nodular opacity noted in  the inferior right upper lobe, image 72, series 6, mildly increased in size the prior study. Small linear and nodular abnormality in the lateral left upper lobe, nodule centered on image 62 measuring 7 mm, new since the prior exam. Stable changes of centrilobular emphysema. No pneumothorax. Upper Abdomen: No acute findings.  Bilateral adrenal masses. Musculoskeletal: No fracture or acute finding. No osteoblastic or osteolytic lesions. Review of the MIP images confirms the above findings. IMPRESSION: 1. No evidence of a pulmonary embolism. 2. Development of new bilateral areas of ground-glass opacity, that may reflect multifocal pneumonia or treatment related pneumonitis. 3. Right hilar region soft tissue is similar to the prior study, likely radiation induced scarring. Active/viable tumor as a component is possible. 4. Trace, right greater than left, pleural effusions, also new since the prior study. Aortic Atherosclerosis (ICD10-I70.0) and Emphysema (ICD10-J43.9). Electronically Signed   By: Lajean Manes M.D.   On: 06/08/2017 16:22   Dg Chest Port 1 View  Result Date: 06/08/2017 CLINICAL DATA:  Followup for dyspnea. History of metastatic non small cell lung carcinoma. EXAM: PORTABLE CHEST 1 VIEW COMPARISON:  Chest radiograph, 06/06/2017.  Chest CT, 05/09/2017. FINDINGS: Right hilar region opacity appears larger than on the recent prior chest radiograph. Some of this is likely due to the semi-erect technique and lower lung volumes. There are irregularly thickened interstitial markings, which extend peripherally from the right hilar masslike opacity and are noted throughout  most of the left lung. These have increased when compared to the prior chest radiograph. No pleural effusion.  No pneumothorax. Right anterior chest wall Port-A-Cath is stable. Heart is normal in size. IMPRESSION: 1. Right hilar region masslike opacity appears increased in size from prior study although some of this may be due to differences in technique and patient positioning. Suspect evolving post radiation change. 2. Interstitial thickening has increased from the prior exam, particularly evident in the left lung. Suspect that this is inflammatory in etiology, related to lung carcinoma treatment. Electronically Signed   By: Lajean Manes M.D.   On: 06/08/2017 09:52     ASSESSMENT/PLAN:  Adenocarcinoma of right lung, stage 4 (HCC) This is a very pleasant 77 year old white male with unresectable a stage II a non-small cell lung cancer, adenocarcinoma presented with right hilar mass status post a course of concurrent chemoradiation with weekly carboplatin and paclitaxel. He has partial response to this treatment. The patient was then on observation for a few months. The patient developed disease progression and he was started on systemic chemotherapy with carboplatin, Alimta and Avastin status post 6 cycles. The patient tolerated the previous treatment fairly well except for increasing fatigue. Repeat CT scan of the chest, abdomen and pelvis showed a stable disease.  He has no metastatic or progressive lesions. It was recommended for the patient to begin maintenance treatment with Alimta 500 mg/M2 and Avastin 15 mg/KG after this induction phase of treatment.  He was also given the option of observation.  The patient opted to proceed with maintenance treatment.  Treatment was delayed due to a recent hospitalization for diabetes, dehydration, and pneumonia.  He is now slowly recovering.  He continues to have fatigue and generalized weakness.  The patient was seen with Dr. Julien Nordmann.  We discussed that he  has been off his chemotherapy for about 2 months.  The fatigue may not be related to his treatment for his lung cancer.  We recommend that he proceed with cycle 1 of his maintenance Alimta and Avastin today as  scheduled.  The Alimta will be dose reduced to 400 mg/m due to his fatigue and generalized weakness.  He will come back for follow-up visit in 3 weeks for evaluation prior to cycle #2 of his maintenance treatment.  The patient was advised to call immediately if he has any concerning symptoms in the interval. The patient voices understanding of current disease status and treatment options and is in agreement with the current care plan. All questions were answered. The patient knows to call the clinic with any problems, questions or concerns. We can certainly see the patient much sooner if necessary.  No orders of the defined types were placed in this encounter.   Mikey Bussing, DNP, AGPCNP-BC, AOCNP 06/27/17  ADDENDUM: Hematology/Oncology Attending: I had a face-to-face encounter with the patient.  I recommended his care plan.  This is a very pleasant 77 years old white male with metastatic non-small cell lung cancer, adenocarcinoma status post recent treatment with systemic chemotherapy with carboplatin, Alimta and Avastin for 6 cycles with partial response.  He was supposed to start maintenance treatment with Alimta and Avastin few weeks ago but this was delayed because of recent hospitalization for treatment of pneumonia and dehydration. He is recovering slowly from his recent hospitalization and feeling much better. I had a lengthy discussion with the patient and his wife about his current condition and treatment options.  I recommended for the patient to proceed with his treatment as a schedule but I would reduce the dose of Alimta to 500 mg/M2. He is expected to start the first cycle of this treatment today and he will come back for follow-up visit in 3 weeks for evaluation before  starting cycle #2. The patient was advised to call immediately if he has any concerning symptoms in the interval.  Disclaimer: This note was dictated with voice recognition software. Similar sounding words can inadvertently be transcribed and may be missed upon review. Eilleen Kempf, MD 06/27/17

## 2017-06-27 ENCOUNTER — Encounter: Payer: Self-pay | Admitting: Oncology

## 2017-06-27 NOTE — Assessment & Plan Note (Signed)
This is a very pleasant 77 year old white male with unresectable a stage II a non-small cell lung cancer, adenocarcinoma presented with right hilar mass status post a course of concurrent chemoradiation with weekly carboplatin and paclitaxel. He has partial response to this treatment. The patient was then on observation for a few months. The patient developed disease progression and he was started on systemic chemotherapy with carboplatin, Alimta and Avastin status post 6 cycles. The patient tolerated the previous treatment fairly well except for increasing fatigue. Repeat CT scan of the chest, abdomen and pelvis showed a stable disease.  He has no metastatic or progressive lesions. It was recommended for the patient to begin maintenance treatment with Alimta 500 mg/M2 and Avastin 15 mg/KG after this induction phase of treatment.  He was also given the option of observation.  The patient opted to proceed with maintenance treatment.  Treatment was delayed due to a recent hospitalization for diabetes, dehydration, and pneumonia.  He is now slowly recovering.  He continues to have fatigue and generalized weakness.  The patient was seen with Dr. Julien Nordmann.  We discussed that he has been off his chemotherapy for about 2 months.  The fatigue may not be related to his treatment for his lung cancer.  We recommend that he proceed with cycle 1 of his maintenance Alimta and Avastin today as scheduled.  The Alimta will be dose reduced to 400 mg/m due to his fatigue and generalized weakness.  He will come back for follow-up visit in 3 weeks for evaluation prior to cycle #2 of his maintenance treatment.  The patient was advised to call immediately if he has any concerning symptoms in the interval. The patient voices understanding of current disease status and treatment options and is in agreement with the current care plan. All questions were answered. The patient knows to call the clinic with any problems, questions  or concerns. We can certainly see the patient much sooner if necessary.

## 2017-06-30 ENCOUNTER — Other Ambulatory Visit: Payer: Self-pay | Admitting: Internal Medicine

## 2017-07-12 ENCOUNTER — Telehealth: Payer: Self-pay | Admitting: Medical Oncology

## 2017-07-12 NOTE — Telephone Encounter (Signed)
RN reports pt weakness, wheezing and sob. Asking to draw CBC. [id number]-TCYELY sat 97% Room air. I instructed nurse to have pt take his inhaler( he has not used today). Per Julien Nordmann I told nurse no lab.

## 2017-07-15 ENCOUNTER — Emergency Department (HOSPITAL_COMMUNITY)
Admission: EM | Admit: 2017-07-15 | Discharge: 2017-07-15 | Disposition: A | Discharge status: Home / Self Care | Payer: Medicare Other | Attending: Emergency Medicine | Admitting: Emergency Medicine

## 2017-07-15 DIAGNOSIS — Z859 Personal history of malignant neoplasm, unspecified: Secondary | ICD-10-CM | POA: Diagnosis not present

## 2017-07-15 DIAGNOSIS — R531 Weakness: Secondary | ICD-10-CM | POA: Diagnosis not present

## 2017-07-15 DIAGNOSIS — Z5321 Procedure and treatment not carried out due to patient leaving prior to being seen by health care provider: Secondary | ICD-10-CM | POA: Diagnosis not present

## 2017-07-15 NOTE — ED Triage Notes (Signed)
Upon beginning my interview, his wife interposes and says "If he can't go back right away, I'm leaving and taking him to the [facility] tomorrow--how long will it be?". When I am unable to give a definitive answer, even assuring her we can begin testing right away, she chooses to leave. They decline to sign AMA form.

## 2017-07-16 ENCOUNTER — Telehealth: Payer: Self-pay | Admitting: *Deleted

## 2017-07-16 ENCOUNTER — Inpatient Hospital Stay: Payer: Medicare Other | Attending: Internal Medicine

## 2017-07-16 ENCOUNTER — Inpatient Hospital Stay (HOSPITAL_BASED_OUTPATIENT_CLINIC_OR_DEPARTMENT_OTHER): Payer: Medicare Other | Admitting: Medical

## 2017-07-16 ENCOUNTER — Other Ambulatory Visit: Payer: Self-pay | Admitting: *Deleted

## 2017-07-16 VITALS — BP 139/85 | HR 134 | Temp 98.1°F | Resp 22

## 2017-07-16 DIAGNOSIS — F419 Anxiety disorder, unspecified: Secondary | ICD-10-CM

## 2017-07-16 DIAGNOSIS — E119 Type 2 diabetes mellitus without complications: Secondary | ICD-10-CM | POA: Diagnosis not present

## 2017-07-16 DIAGNOSIS — J189 Pneumonia, unspecified organism: Secondary | ICD-10-CM | POA: Diagnosis not present

## 2017-07-16 DIAGNOSIS — C3401 Malignant neoplasm of right main bronchus: Secondary | ICD-10-CM

## 2017-07-16 DIAGNOSIS — R531 Weakness: Secondary | ICD-10-CM | POA: Insufficient documentation

## 2017-07-16 DIAGNOSIS — C3491 Malignant neoplasm of unspecified part of right bronchus or lung: Secondary | ICD-10-CM

## 2017-07-16 LAB — CMP (CANCER CENTER ONLY)
ALK PHOS: 78 U/L (ref 40–150)
ALT: 24 U/L (ref 0–55)
ANION GAP: 16 — AB (ref 3–11)
AST: 36 U/L — ABNORMAL HIGH (ref 5–34)
Albumin: 3.2 g/dL — ABNORMAL LOW (ref 3.5–5.0)
BUN: 21 mg/dL (ref 7–26)
CALCIUM: 7.2 mg/dL — AB (ref 8.4–10.4)
CO2: 22 mmol/L (ref 22–29)
Chloride: 99 mmol/L (ref 98–109)
Creatinine: 1.36 mg/dL — ABNORMAL HIGH (ref 0.70–1.30)
GFR, EST AFRICAN AMERICAN: 56 mL/min — AB (ref >60)
GFR, Estimated: 49 mL/min — ABNORMAL LOW (ref >60)
GLUCOSE: 117 mg/dL (ref 70–140)
Potassium: 4.2 mmol/L (ref 3.5–5.1)
Sodium: 137 mmol/L (ref 136–145)
TOTAL PROTEIN: 7.8 g/dL (ref 6.4–8.3)
Total Bilirubin: 0.9 mg/dL (ref 0.2–1.2)

## 2017-07-16 LAB — CBC WITH DIFFERENTIAL (CANCER CENTER ONLY)
Basophils Absolute: 0.1 10*3/uL (ref 0.0–0.1)
Basophils Relative: 1 %
Eosinophils Absolute: 0 10*3/uL (ref 0.0–0.5)
Eosinophils Relative: 0 %
HCT: 32 % — ABNORMAL LOW (ref 38.4–49.9)
HEMOGLOBIN: 10.5 g/dL — AB (ref 13.0–17.1)
LYMPHS ABS: 0.8 10*3/uL — AB (ref 0.9–3.3)
LYMPHS PCT: 9 %
MCH: 30.3 pg (ref 27.2–33.4)
MCHC: 32.8 g/dL (ref 32.0–36.0)
MCV: 92.4 fL (ref 79.3–98.0)
MONOS PCT: 17 %
Monocytes Absolute: 1.6 10*3/uL — ABNORMAL HIGH (ref 0.1–0.9)
NEUTROS ABS: 6.7 10*3/uL — AB (ref 1.5–6.5)
NEUTROS PCT: 73 %
Platelet Count: 121 10*3/uL — ABNORMAL LOW (ref 140–400)
RBC: 3.46 MIL/uL — ABNORMAL LOW (ref 4.20–5.82)
RDW: 21.9 % — ABNORMAL HIGH (ref 11.0–14.6)
WBC Count: 9.2 10*3/uL (ref 4.0–10.3)

## 2017-07-16 MED ORDER — PREDNISONE 5 MG PO TABS
ORAL_TABLET | ORAL | 0 refills | Status: DC
Start: 2017-07-16 — End: 2017-08-21

## 2017-07-16 MED ORDER — AZITHROMYCIN 500 MG PO TABS
500.0000 mg | ORAL_TABLET | Freq: Every day | ORAL | 0 refills | Status: DC
Start: 2017-07-16 — End: 2017-07-30

## 2017-07-16 MED ORDER — LORAZEPAM 0.5 MG PO TABS: 0.5000 mg | ORAL_TABLET | Freq: Two times a day (BID) | ORAL | 0 refills | Status: AC | PRN

## 2017-07-16 NOTE — Telephone Encounter (Signed)
Received forwarded vm message from pt's wife requesting a visit today. TCT wife. Spoke with Mrs. Trauger. She states her husband has gotten much weaker, has had several nosebleeds in the last few days. Pt is scheduled for maintenance Alimta tomorrow but wife doesn't think he can handle it right now.  She is asking for [facility]  Visit today.  High priority scheduling message sent along with lab orders.

## 2017-07-16 NOTE — Progress Notes (Signed)
[facility] Progress Note   Mark Howell [id number] [birth year] 77 y.o.  Mark Howell is managed by Dr. Eilleen Kempf  Actively treated with chemotherapy: yes  Current Therapy: Alimta and Avastin  Last Treated: 06/26/2017  Assessment: Plan:    Weakness generalized - Plan: predniSONE (DELTASONE) 5 MG tablet  Anxiety - Plan: LORazepam (ATIVAN) 0.5 MG tablet  Adenocarcinoma of right lung, stage 4 (HCC)  Pneumonitis - Plan: azithromycin (ZITHROMAX) 500 MG tablet  Weakness, generalized: The patient had recently been on prednisone 20 mg daily for pneumonitis.  He was tapered from 20 mg to 5 mg at which time his weakness increased in severity.  Based on this he has been placed on a steroid taper.  Beginning tomorrow he will take prednisone 15 he has been mg daily times 3 days then 10 mg daily times 3 days then back to his baseline treatment of 5 mg daily.  Told to follow his blood sugar closely.  He is to call should it grossly change over his baseline of 70-200.    Anxiety: The patient was given a refill of lorazepam 0.5 mg p.o. twice daily as needed for anxiety and nausea.  Adenocarcinoma of the right lung, stage IV: The patient was scheduled to receive Alimta and Avastin tomorrow.  He is asked to defer this treatment.  He will be contacted to schedule his next follow-up and treatment.  History of pneumonitis with marked wheezing: The patient was given a prescription for azithromycin 500 mg daily times 7 days.  Please see After Visit Summary for patient specific instructions.  Future Appointments  Date Time Provider [place]  07/17/2017 12:45 PM [facility] LAB 6 [facility] None  07/17/2017  1:00 PM [facility] INJ NURSE [facility] None  07/17/2017  1:30 PM Curcio, Roselie Awkward, NP [facility] None  07/17/2017  2:15 PM [facility] G23 [facility] None  08/07/2017 10:45 AM [facility] LAB 2 [facility] None  08/07/2017 11:00 AM [facility] FLUSH NURSE 2  [facility] None  08/07/2017 11:30 AM Curcio, Roselie Awkward, NP [facility] None  08/07/2017 12:15 PM [facility] J32 DNS [facility] None  08/28/2017 10:45 AM [facility] LAB 3 [facility] None  08/28/2017 11:00 AM [facility] FLUSH NURSE [facility] None  08/28/2017 12:15 PM [facility] A3 [facility] None  09/18/2017 10:45 AM [facility] LAB 3 [facility] None  09/18/2017 11:00 AM [facility] FLUSH NURSE [facility] None  09/18/2017 11:30 AM Curcio, Roselie Awkward, NP [facility] None  09/18/2017 12:15 PM [facility] E17 [facility] None    No orders of the defined types were placed in this encounter.      Subjective:   Patient ID:  Mark Howell is a 77 y.o. (DOB 10-20-1940) male.  Chief Complaint:  Chief Complaint  Patient presents with  . 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  He was diagnosed as a stage IIa that was unresectable.  He has recently been diagnosed with diabetes. He takes glipizide 10 mg each morning. He was last seen at this office on 06/26/2017 and was treated with Avastin and Alimta at that time. He presented to the ER yesterday with weakness but did not stay to be seen due to the wait time. He has had several nosebleeds in the last few days. He is scheduled to receive Alimta tomorrow. His wife is concerned and believes that he is too weak to be treated.  They would like to delay his treatment by 1-2  weeks.  He is recently been treated for pneumonitis and has been on prednisone at 20 mg daily times 3 weeks.  He recently reduced his prednisone to 5 mg daily.  He has noted marked weakness since this change.  He continues following his blood sugar closely.  He reports his blood sugar ranges from 70-200.  He has had some matting of his eyes throughout the day.  He has no eye pain or injection of the sclera.  He has had anorexia and a sore throat.  He has not been participating in home physical therapy  recently.  He would like to attempt to restart it this week.  He denies fever, sweats, constipation, or diarrhea.  Medications: I have reviewed the patient's current medications.  Allergies:  Allergies  Allergen Reactions  . Hydrocodone Rash  . Oxycodone Rash    Past Medical History:  Diagnosis Date  . Arthritis   . Complication of anesthesia    problems voiding post op  . Full dentures   . GERD (gastroesophageal reflux disease)   . History of radiation therapy 01/25/16-02/28/16   right lung 45 Gy  . History of radiation therapy 04/17/16-05/02/16   right lung boost 20 Gy in 10 fractions cumulative dose 65 gray  . HOH (hard of hearing)   . HTN (hypertension)   . Hyperlipidemia   . Lung mass 12/30/2015  . Odynophagia 04/06/2016  . Psoriatic arthritis ([place])   . Snores   . Wears glasses     Past Surgical History:  Procedure Laterality Date  . COLONOSCOPY    . HEMORRHOID SURGERY N/A 10/01/2013   Procedure: EXAM UNDER ANESTHESIA  AND EXCISIONAL HEMORRHOIDECTOMY WITH HEMORRHOID BANDING X 2;  Surgeon: Gayland Curry, MD;  Location: [facility];  Service: General;  Laterality: N/A;  . INGUINAL HERNIA REPAIR  01/04/2012   Procedure: LAPAROSCOPIC INGUINAL HERNIA;  Surgeon: Gayland Curry, MD,FACS;  Location: [facility];  Service: General;  Laterality: Right;  . IR FLUORO GUIDE PORT INSERTION RIGHT  01/02/2017  . IR US GUIDE VASC ACCESS RIGHT  01/02/2017  . VIDEO BRONCHOSCOPY WITH ENDOBRONCHIAL ULTRASOUND N/A 12/31/2015   Procedure: VIDEO BRONCHOSCOPY WITH ENDOBRONCHIAL ULTRASOUND transbronchial biopsy of node 10 R lymph node;  Surgeon: Grace Isaac, MD;  Location: [facility] OR;  Service: Thoracic;  Laterality: N/A;    Family History  Problem Relation Age of Onset  . Stroke Father   . Diabetes Father   . Heart disease Father   . Cancer Brother        pancreatic    Social History   Socioeconomic History  . Marital status: Married    Spouse name: Not on file  . Number of children:  Not on file  . Years of education: Not on file  . Highest education level: Not on file  Social Needs  . Financial resource strain: Not on file  . Food insecurity - worry: Not on file  . Food insecurity - inability: Not on file  . Transportation needs - medical: Not on file  . Transportation needs - non-medical: Not on file  Occupational History  . Not on file  Tobacco Use  . Smoking status: Former Smoker    Packs/day: 0.50    Years: 50.00    Pack years: 25.00    Types: Cigarettes    Last attempt to quit: 11/30/2015    Years since quitting: 1.6  . Smokeless tobacco: Never Used  Substance and Sexual Activity  . Alcohol use:  Yes    Comment: occassionally  . Drug use: No  . Sexual activity: Not Currently  Other Topics Concern  . Not on file  Social History Narrative  . Not on file    Past Medical History, Surgical history, Social history, and Family history were reviewed and updated as appropriate.   Please see review of systems for further details on the patient's review from today.   Review of Systems:  Review of Systems  Constitutional: Positive for activity change, appetite change, chills and fatigue. Negative for diaphoresis and fever.  HENT: Negative for trouble swallowing.   Eyes: Positive for discharge. Negative for pain, redness and itching.  Respiratory: Positive for cough, shortness of breath and wheezing. Negative for choking and chest tightness.   Cardiovascular: Negative for chest pain, palpitations and leg swelling.  Gastrointestinal: Negative for constipation, diarrhea and nausea.  Neurological: Positive for weakness.    Objective:   Physical Exam:  BP 139/85 (BP Location: Left Arm, Patient Position: Sitting)   Pulse (!) 134 Comment: RN Beth & RN Liza aware of pulse.  Temp 98.1 F (36.7 C) (Oral)   Resp (!) 22   SpO2 98%  ECOG: 1  Physical Exam  Constitutional: No distress.  HENT:  Head: Normocephalic and atraumatic.  Mouth/Throat: Oropharynx is  clear and moist. No oropharyngeal exudate.  Eyes: No scleral icterus.  Scant crusting is noted along the eyelashes bilaterally.  No injection of the sclerae noted.  Neck: Normal range of motion.  Cardiovascular: Normal rate, regular rhythm and normal heart sounds. Exam reveals no gallop and no friction rub.  No murmur heard. Pulmonary/Chest: Effort normal. No respiratory distress. He has wheezes (Wheezes noted throughout the left lung field.).  Lymphadenopathy:    He has no cervical adenopathy.  Neurological: He is alert. Coordination (Mark Howell is ambulating with the use of a wheelchair.) abnormal.  Skin: Skin is warm and dry. No rash noted. He is not diaphoretic. No erythema.    Lab Review:     Component Value Date/Time   NA 137 07/16/2017 1320   NA 135 (L) 04/30/2017 0824   K 4.2 07/16/2017 1320   K 4.4 04/30/2017 0824   CL 99 07/16/2017 1320   CO2 22 07/16/2017 1320   CO2 19 (L) 04/30/2017 0824   GLUCOSE 117 07/16/2017 1320   GLUCOSE 277 (H) 04/30/2017 0824   BUN 21 07/16/2017 1320   BUN 28.0 (H) 04/30/2017 0824   CREATININE 1.36 (H) 07/16/2017 1320   CREATININE 1.5 (H) 04/30/2017 0824   CALCIUM 7.2 (L) 07/16/2017 1320   CALCIUM 7.6 (L) 04/30/2017 0824   PROT 7.8 07/16/2017 1320   PROT 7.7 04/30/2017 0824   ALBUMIN 3.2 (L) 07/16/2017 1320   ALBUMIN 3.7 04/30/2017 0824   AST 36 (H) 07/16/2017 1320   AST 22 04/30/2017 0824   ALT 24 07/16/2017 1320   ALT 19 04/30/2017 0824   ALKPHOS 78 07/16/2017 1320   ALKPHOS 88 04/30/2017 0824   BILITOT 0.9 07/16/2017 1320   BILITOT 0.57 04/30/2017 0824   GFRNONAA 49 (L) 07/16/2017 1320   GFRAA 56 (L) 07/16/2017 1320       Component Value Date/Time   WBC 9.2 07/16/2017 1320   WBC 12.6 (H) 06/12/2017 0153   RBC 3.46 (L) 07/16/2017 1320   HGB 10.0 (L) 06/12/2017 0153   HGB 10.9 (L) 04/30/2017 0824   HCT 32.0 (L) 07/16/2017 1320   HCT 32.7 (L) 04/30/2017 0824   PLT 121 (L) 07/16/2017 1320  PLT 118 (L) 04/30/2017 0824   MCV  92.4 07/16/2017 1320   MCV 101.9 (H) 04/30/2017 0824   MCH 30.3 07/16/2017 1320   MCHC 32.8 07/16/2017 1320   RDW 21.9 (H) 07/16/2017 1320   RDW 19.3 (H) 04/30/2017 0824   LYMPHSABS 0.8 (L) 07/16/2017 1320   LYMPHSABS 1.3 04/30/2017 0824   MONOABS 1.6 (H) 07/16/2017 1320   MONOABS 1.2 (H) 04/30/2017 0824   EOSABS 0.0 07/16/2017 1320   EOSABS 0.0 04/30/2017 0824   BASOSABS 0.1 07/16/2017 1320   BASOSABS 0.0 04/30/2017 0824   -------------------------------  Imaging from last 24 hours (if applicable):  Radiology interpretation: No results found.

## 2017-07-17 ENCOUNTER — Telehealth: Payer: Self-pay | Admitting: Oncology

## 2017-07-17 ENCOUNTER — Ambulatory Visit: Payer: Medicare Other | Admitting: Oncology

## 2017-07-17 ENCOUNTER — Other Ambulatory Visit: Payer: Medicare Other

## 2017-07-17 ENCOUNTER — Ambulatory Visit: Payer: Medicare Other

## 2017-07-17 NOTE — Telephone Encounter (Signed)
Scheduled appt per 3/18 sch message - left message for patient with apt date and time. Other appts not yet adjusted . Will call patient again tomorrow to confirm appt date and time.

## 2017-07-18 NOTE — Telephone Encounter (Signed)
Called patient back today and they are okay with appt r/s and aware that they need to come to scheduling on 4/1 to pick up an updated schedule.

## 2017-07-29 ENCOUNTER — Other Ambulatory Visit: Payer: Self-pay | Admitting: Internal Medicine

## 2017-07-29 DIAGNOSIS — C3491 Malignant neoplasm of unspecified part of right bronchus or lung: Secondary | ICD-10-CM

## 2017-07-29 DIAGNOSIS — Z5111 Encounter for antineoplastic chemotherapy: Secondary | ICD-10-CM

## 2017-07-30 ENCOUNTER — Encounter: Payer: Self-pay | Admitting: Oncology

## 2017-07-30 ENCOUNTER — Inpatient Hospital Stay: Payer: Medicare Other

## 2017-07-30 ENCOUNTER — Inpatient Hospital Stay (HOSPITAL_BASED_OUTPATIENT_CLINIC_OR_DEPARTMENT_OTHER): Payer: Medicare Other | Admitting: Oncology

## 2017-07-30 ENCOUNTER — Telehealth: Payer: Self-pay | Admitting: Oncology

## 2017-07-30 ENCOUNTER — Inpatient Hospital Stay: Payer: Medicare Other | Attending: Internal Medicine

## 2017-07-30 VITALS — BP 129/87 | HR 96

## 2017-07-30 DIAGNOSIS — Z923 Personal history of irradiation: Secondary | ICD-10-CM | POA: Diagnosis not present

## 2017-07-30 DIAGNOSIS — Z95828 Presence of other vascular implants and grafts: Secondary | ICD-10-CM

## 2017-07-30 DIAGNOSIS — Z79899 Other long term (current) drug therapy: Secondary | ICD-10-CM | POA: Insufficient documentation

## 2017-07-30 DIAGNOSIS — C3491 Malignant neoplasm of unspecified part of right bronchus or lung: Secondary | ICD-10-CM

## 2017-07-30 DIAGNOSIS — Z9221 Personal history of antineoplastic chemotherapy: Secondary | ICD-10-CM | POA: Diagnosis not present

## 2017-07-30 DIAGNOSIS — Z5111 Encounter for antineoplastic chemotherapy: Secondary | ICD-10-CM

## 2017-07-30 DIAGNOSIS — I10 Essential (primary) hypertension: Secondary | ICD-10-CM | POA: Insufficient documentation

## 2017-07-30 DIAGNOSIS — Z5112 Encounter for antineoplastic immunotherapy: Secondary | ICD-10-CM | POA: Insufficient documentation

## 2017-07-30 DIAGNOSIS — C3401 Malignant neoplasm of right main bronchus: Secondary | ICD-10-CM

## 2017-07-30 LAB — CBC WITH DIFFERENTIAL (CANCER CENTER ONLY)
BASOS ABS: 0 10*3/uL (ref 0.0–0.1)
BASOS PCT: 0 %
Eosinophils Absolute: 0 10*3/uL (ref 0.0–0.5)
Eosinophils Relative: 0 %
HEMATOCRIT: 28.8 % — AB (ref 38.4–49.9)
HEMOGLOBIN: 9.5 g/dL — AB (ref 13.0–17.1)
LYMPHS PCT: 6 %
Lymphs Abs: 0.6 10*3/uL — ABNORMAL LOW (ref 0.9–3.3)
MCH: 30.2 pg (ref 27.2–33.4)
MCHC: 33 g/dL (ref 32.0–36.0)
MCV: 91.7 fL (ref 79.3–98.0)
Monocytes Absolute: 0.3 10*3/uL (ref 0.1–0.9)
Monocytes Relative: 3 %
NEUTROS ABS: 9.1 10*3/uL — AB (ref 1.5–6.5)
NEUTROS PCT: 91 %
Platelet Count: 157 10*3/uL (ref 140–400)
RBC: 3.13 MIL/uL — ABNORMAL LOW (ref 4.20–5.82)
RDW: 22.5 % — AB (ref 11.0–14.6)
WBC Count: 10.2 10*3/uL (ref 4.0–10.3)

## 2017-07-30 LAB — CMP (CANCER CENTER ONLY)
ALT: 16 U/L (ref 0–55)
AST: 26 U/L (ref 5–34)
Albumin: 3.3 g/dL — ABNORMAL LOW (ref 3.5–5.0)
Alkaline Phosphatase: 73 U/L (ref 40–150)
Anion gap: 15 — ABNORMAL HIGH (ref 3–11)
BUN: 19 mg/dL (ref 7–26)
CHLORIDE: 103 mmol/L (ref 98–109)
CO2: 21 mmol/L — AB (ref 22–29)
Calcium: 7.6 mg/dL — ABNORMAL LOW (ref 8.4–10.4)
Creatinine: 1.05 mg/dL (ref 0.70–1.30)
GFR, Est AFR Am: >60 mL/min (ref >60)
GFR, Estimated: >60 mL/min (ref >60)
Glucose, Bld: 230 mg/dL — ABNORMAL HIGH (ref 70–140)
POTASSIUM: 4.4 mmol/L (ref 3.5–5.1)
SODIUM: 139 mmol/L (ref 136–145)
Total Bilirubin: 0.3 mg/dL (ref 0.2–1.2)
Total Protein: 7.4 g/dL (ref 6.4–8.3)

## 2017-07-30 MED ORDER — PROCHLORPERAZINE MALEATE 10 MG PO TABS
ORAL_TABLET | ORAL | Status: AC
  Filled 2017-07-30: qty 1

## 2017-07-30 MED ORDER — FOLIC ACID 1 MG PO TABS: 1.0000 mg | ORAL_TABLET | Freq: Every day | ORAL | 2 refills | Status: DC

## 2017-07-30 MED ORDER — SODIUM CHLORIDE 0.9 % IV SOLN
410.0000 mg/m2 | Freq: Once | INTRAVENOUS | Status: AC
  Administered 2017-07-30: 800 mg via INTRAVENOUS
  Filled 2017-07-30: qty 12

## 2017-07-30 MED ORDER — PROCHLORPERAZINE MALEATE 10 MG PO TABS
10.0000 mg | ORAL_TABLET | Freq: Once | ORAL | Status: AC
  Administered 2017-07-30: 10 mg via ORAL

## 2017-07-30 MED ORDER — HEPARIN SOD (PORK) LOCK FLUSH 100 UNIT/ML IV SOLN
500.0000 [IU] | Freq: Once | INTRAVENOUS | Status: AC | PRN
  Administered 2017-07-30: 500 [IU]
  Filled 2017-07-30: qty 5

## 2017-07-30 MED ORDER — DEXAMETHASONE 4 MG PO TABS: ORAL_TABLET | ORAL | 1 refills | Status: DC

## 2017-07-30 MED ORDER — SODIUM CHLORIDE 0.9% FLUSH
10.0000 mL | INTRAVENOUS | Status: DC | PRN
  Administered 2017-07-30: 10 mL
  Filled 2017-07-30: qty 10

## 2017-07-30 MED ORDER — SODIUM CHLORIDE 0.9 % IV SOLN
Freq: Once | INTRAVENOUS | Status: AC
  Administered 2017-07-30: 14:00:00 via INTRAVENOUS

## 2017-07-30 MED ORDER — SODIUM CHLORIDE 0.9 % IV SOLN
1200.0000 mg | Freq: Once | INTRAVENOUS | Status: AC
  Administered 2017-07-30: 1200 mg via INTRAVENOUS
  Filled 2017-07-30: qty 48

## 2017-07-30 MED ORDER — SODIUM CHLORIDE 0.9% FLUSH
10.0000 mL | Freq: Once | INTRAVENOUS | Status: AC
  Administered 2017-07-30: 10 mL
  Filled 2017-07-30: qty 10

## 2017-07-30 NOTE — Patient Instructions (Signed)
[facility] Discharge Instructions for Patients Receiving Chemotherapy  Today you received the following chemotherapy agents alimta/avastin   To help prevent nausea and vomiting after your treatment, we encourage you to take your nausea medication as directed  If you develop nausea and vomiting that is not controlled by your nausea medication, call the clinic.   BELOW ARE SYMPTOMS THAT SHOULD BE REPORTED IMMEDIATELY:  *FEVER GREATER THAN 100.5 F  *CHILLS WITH OR WITHOUT FEVER  NAUSEA AND VOMITING THAT IS NOT CONTROLLED WITH YOUR NAUSEA MEDICATION  *UNUSUAL SHORTNESS OF BREATH  *UNUSUAL BRUISING OR BLEEDING  TENDERNESS IN MOUTH AND THROAT WITH OR WITHOUT PRESENCE OF ULCERS  *URINARY PROBLEMS  *BOWEL PROBLEMS  UNUSUAL RASH Items with * indicate a potential emergency and should be followed up as soon as possible.  Feel free to call the clinic you have any questions or concerns. The clinic phone number is [phone].

## 2017-07-30 NOTE — Progress Notes (Signed)
[facility] OFFICE PROGRESS NOTE  [name], MD [place] [address]  DIAGNOSIS: Metastatic non-small cell lung cancer, adenocarcinoma initially diagnosed as Unresectable stage IIA (T1a, N1, M0) non-small cell lung cancer, adenocarcinoma presented with a right hilar mass and right hilar adenopathy diagnosed in September 2017. [facility]: Negative for EGFR, ALK, ROS 1 and BRAF mutations.  PRIOR THERAPY: 1)Status post concurrent chemoradiation with weekly carboplatin for AUC of 2 and paclitaxel 45 MG/M2 for 7 weeks. 2)Systemic chemotherapy with carboplatin for AUC of 5, Alimta 500 MG/M2 and Avastin 15 MG/KG every 3 weeks. First dose 01/08/2017. Status post6cycles.Last dose was given April 30, 2017 stable disease.  CURRENT THERAPY: Maintenance treatment with Alimta 400 mg/M2 and Avastin 15 mg/KG every 3 weeks.  Alimta has been dose reduced due to ongoing fatigue weakness.First dose 06/26/2017.  Status post 1 cycle.  INTERVAL HISTORY: KODEN HUNZEKER 77 y.o. male returns for routine follow-up visit accompanied by his wife.  The patient delayed the start of the second cycle of chemotherapy due to fatigue.  The patient is feeling better since his last visit.  He still has mild fatigue, but is able to get out of the house and mows the yard.  He denies fevers and chills.  Denies chest pain, shortness of breath, cough, hemoptysis.  Denies nausea, vomiting, constipation, diarrhea.  He is gaining back some of his lost weight.  The patient is here for evaluation prior to cycle 2 of his treatment.  MEDICAL HISTORY: Past Medical History:  Diagnosis Date  . Arthritis   . Complication of anesthesia    problems voiding post op  . Full dentures   . GERD (gastroesophageal reflux disease)   . History of radiation therapy 01/25/16-02/28/16   right lung 45 Gy  . History of radiation therapy 04/17/16-05/02/16   right lung boost 20 Gy in 10 fractions cumulative  dose 65 gray  . HOH (hard of hearing)   . HTN (hypertension)   . Hyperlipidemia   . Lung mass 12/30/2015  . Odynophagia 04/06/2016  . Psoriatic arthritis ([place])   . Snores   . Wears glasses     ALLERGIES:  is allergic to hydrocodone and oxycodone.  MEDICATIONS:  Current Outpatient Medications  Medication Sig Dispense Refill  . albuterol (VENTOLIN HFA) 108 (90 Base) MCG/ACT inhaler Inhale 2 puffs into the lungs every 6 (six) hours as needed for wheezing or shortness of breath.     Marland Kitchen aspirin EC 81 MG tablet Take 81 mg by mouth at bedtime.     . blood glucose meter kit and supplies KIT Dispense based on patient and insurance preference. Use up to four times daily as directed. (FOR ICD-9 250.00, 250.01). 1 each 0  . calcium carbonate (TUMS - DOSED IN MG ELEMENTAL CALCIUM) 500 MG chewable tablet Chew 1 tablet by mouth daily.    . Cetirizine HCl 10 MG TBDP Take 1 tablet by mouth daily.     . Coenzyme Q10 (CO Q 10 PO) Take 1 tablet by mouth daily.    . Continuous Blood Gluc Sensor MISC 1 each by Does not apply route as directed. Use as directed every 10 days. May dispense FreeStyle [employer] or similar. 2 each 2  . dexamethasone (DECADRON) 4 MG tablet 4 mg by mouth twice a day the day before, day of and day after chemotherapy every 3 weeks. 40 tablet 1  . diphenhydramine-acetaminophen (TYLENOL PM) 25-500 MG TABS tablet Take 1 tablet by  mouth at bedtime as needed (sleep).    . diphenoxylate-atropine (LOMOTIL) 2.5-0.025 MG tablet Take 2 tablets by mouth 4 (four) times daily as needed for diarrhea or loose stools. 40 tablet 1  . fluticasone-salmeterol (ADVAIR HFA) 115-21 MCG/ACT inhaler Inhale 2 puffs into the lungs 2 (two) times daily. 1 Inhaler 12  . folic acid (FOLVITE) 1 MG tablet Take 1 tablet (1 mg total) by mouth daily. 30 tablet 2  . glipiZIDE (GLUCOTROL) 10 MG tablet Take 1 tablet (10 mg total) by mouth daily before breakfast. (Patient taking differently: Take 10 mg by mouth daily  before breakfast. ) 30 tablet 1  . lidocaine (XYLOCAINE) 2 % solution Use as directed 20 mLs in the mouth or throat every 3 (three) hours as needed for mouth pain. 100 mL 2  . lidocaine-prilocaine (EMLA) cream Apply 1 application topically as needed. 30 g 0  . loperamide (IMODIUM A-D) 2 MG tablet Take 2 mg by mouth daily as needed for diarrhea or loose stools.    Marland Kitchen LORazepam (ATIVAN) 0.5 MG tablet Take 1 tablet (0.5 mg total) by mouth 2 (two) times daily as needed for anxiety. 30 tablet 0  . Lutein 10 MG TABS Take 10 mg by mouth at bedtime.     . magic mouthwash SOLN Take 5 mLs by mouth 4 (four) times daily as needed for mouth pain. (Patient taking differently: Take 5 mLs by mouth 4 (four) times daily as needed for mouth pain. SWISH AND SPIT OUT) 240 mL 2  . Methylcobalamin (METHYL B-12 PO) Take 1 tablet by mouth daily.    . mirtazapine (REMERON) 15 MG tablet Take 15 mg by mouth at bedtime.    . Multiple Vitamin (MULITIVITAMIN WITH MINERALS) TABS Take 1 tablet by mouth daily.    . pantoprazole (PROTONIX) 40 MG tablet Take 40 mg by mouth daily.     . pravastatin (PRAVACHOL) 40 MG tablet Take 40 mg by mouth at bedtime.     . predniSONE (DELTASONE) 5 MG tablet Take 5 mg by mouth daily with breakfast.    . tamsulosin (FLOMAX) 0.4 MG CAPS capsule TAKE (1) CAPSULE DAILY, START 4 DAYS BEFORE PROCEDURE (Patient taking differently: take 1 capsule by mouth every morning) 30 capsule 2  . triamcinolone (NASACORT) 55 MCG/ACT AERO nasal inhaler Place 1 spray into the nose at bedtime.     Marland Kitchen Dextromethorphan-Guaifenesin (DELSYM COUGH/CHEST CONGEST DM PO) Take 30 mLs by mouth daily as needed (cold symptoms).    Marland Kitchen guaiFENesin (MUCINEX) 600 MG 12 hr tablet Take 1 tablet (600 mg total) by mouth 2 (two) times daily. (Patient not taking: Reported on 07/30/2017) 60 tablet 0  . metoprolol tartrate (LOPRESSOR) 25 MG tablet Take 0.5 tablets (12.5 mg total) by mouth 2 (two) times daily. (Patient not taking: Reported on  07/30/2017) 60 tablet 0  . predniSONE (DELTASONE) 5 MG tablet Take 3 tabs x 3 days, then 2 tabs x 3 days, then stop, monitor blood glucose closely (Patient not taking: Reported on 07/30/2017) 15 tablet 0  . prochlorperazine (COMPAZINE) 10 MG tablet Take 1 tablet (10 mg total) by mouth every 6 (six) hours as needed for nausea or vomiting. (Patient not taking: Reported on 07/30/2017) 30 tablet 0   No current facility-administered medications for this visit.    Facility-Administered Medications Ordered in Other Visits  Medication Dose Route Frequency Provider Last Rate Last Dose  . bevacizumab (AVASTIN) 1,200 mg in sodium chloride 0.9 % 100 mL chemo infusion  1,200 mg  Intravenous Once Curt Bears, MD 296 mL/hr at 07/30/17 1451 1,200 mg at 07/30/17 1451  . heparin lock flush 100 unit/mL  500 Units Intracatheter Once PRN Curt Bears, MD      . PEMEtrexed (ALIMTA) 800 mg in sodium chloride 0.9 % 100 mL chemo infusion  410 mg/m2 (Treatment Plan Recorded) Intravenous Once Curt Bears, MD      . sodium chloride flush (NS) 0.9 % injection 10 mL  10 mL Intracatheter PRN Curt Bears, MD        SURGICAL HISTORY:  Past Surgical History:  Procedure Laterality Date  . COLONOSCOPY    . HEMORRHOID SURGERY N/A 10/01/2013   Procedure: EXAM UNDER ANESTHESIA  AND EXCISIONAL HEMORRHOIDECTOMY WITH HEMORRHOID BANDING X 2;  Surgeon: Gayland Curry, MD;  Location: [facility];  Service: General;  Laterality: N/A;  . INGUINAL HERNIA REPAIR  01/04/2012   Procedure: LAPAROSCOPIC INGUINAL HERNIA;  Surgeon: Gayland Curry, MD,FACS;  Location: [facility];  Service: General;  Laterality: Right;  . IR FLUORO GUIDE PORT INSERTION RIGHT  01/02/2017  . IR US GUIDE VASC ACCESS RIGHT  01/02/2017  . VIDEO BRONCHOSCOPY WITH ENDOBRONCHIAL ULTRASOUND N/A 12/31/2015   Procedure: VIDEO BRONCHOSCOPY WITH ENDOBRONCHIAL ULTRASOUND transbronchial biopsy of node 10 R lymph node;  Surgeon: Grace Isaac, MD;  Location: [facility];   Service: Thoracic;  Laterality: N/A;    REVIEW OF SYSTEMS:   Review of Systems  Constitutional: Negative for appetite change, chills, fever and unexpected weight change. Positive for fatigue. HENT:   Negative for mouth sores, nosebleeds, sore throat and trouble swallowing.   Eyes: Negative for eye problems and icterus.  Respiratory: Negative for cough, hemoptysis, shortness of breath and wheezing.   Cardiovascular: Negative for chest pain and leg swelling.  Gastrointestinal: Negative for abdominal pain, constipation, diarrhea, nausea and vomiting.  Genitourinary: Negative for bladder incontinence, difficulty urinating, dysuria, frequency and hematuria.   Musculoskeletal: Negative for back pain, neck pain and neck stiffness.  Skin: Negative for itching and rash.  Neurological: Negative for dizziness, extremity weakness, headaches, light-headedness and seizures.  Hematological: Negative for adenopathy. Does not bruise/bleed easily.  Psychiatric/Behavioral: Negative for confusion, depression and sleep disturbance. The patient is not nervous/anxious.     PHYSICAL EXAMINATION:  Blood pressure 118/68, pulse (!) 106, temperature 98.4 F (36.9 C), temperature source Oral, resp. rate 19, weight 173 lb 3.2 oz (78.6 kg), SpO2 99 %.  ECOG PERFORMANCE STATUS: 1 - Symptomatic but completely ambulatory  Physical Exam  Constitutional: Oriented to person, place, and time and well-developed, well-nourished, and in no distress. No distress.  HENT:  Head: Normocephalic and atraumatic.  Mouth/Throat: Oropharynx is clear and moist. No oropharyngeal exudate.  Eyes: Conjunctivae are normal. Right eye exhibits no discharge. Left eye exhibits no discharge. No scleral icterus.  Neck: Normal range of motion. Neck supple.  Cardiovascular: Normal rate, regular rhythm, normal heart sounds and intact distal pulses.   Pulmonary/Chest: Effort normal and breath sounds normal. No respiratory distress. No wheezes. No  rales.  Abdominal: Soft. Bowel sounds are normal. Exhibits no distension and no mass. There is no tenderness.  Musculoskeletal: Normal range of motion. Exhibits no edema.  Lymphadenopathy:    No cervical adenopathy.  Neurological: Alert and oriented to person, place, and time. Exhibits normal muscle tone. Coordination normal.  Skin: Skin is warm and dry. No rash noted. Not diaphoretic. No erythema. No pallor.  Psychiatric: Mood, memory and judgment normal.  Vitals reviewed.  LABORATORY DATA: Lab Results  Component Value Date   WBC 10.2 07/30/2017   HGB 10.0 (L) 06/12/2017   HCT 28.8 (L) 07/30/2017   MCV 91.7 07/30/2017   PLT 157 07/30/2017      Chemistry      Component Value Date/Time   NA 139 07/30/2017 1240   NA 135 (L) 04/30/2017 0824   K 4.4 07/30/2017 1240   K 4.4 04/30/2017 0824   CL 103 07/30/2017 1240   CO2 21 (L) 07/30/2017 1240   CO2 19 (L) 04/30/2017 0824   BUN 19 07/30/2017 1240   BUN 28.0 (H) 04/30/2017 0824   CREATININE 1.05 07/30/2017 1240   CREATININE 1.5 (H) 04/30/2017 0824      Component Value Date/Time   CALCIUM 7.6 (L) 07/30/2017 1240   CALCIUM 7.6 (L) 04/30/2017 0824   ALKPHOS 73 07/30/2017 1240   ALKPHOS 88 04/30/2017 0824   AST 26 07/30/2017 1240   AST 22 04/30/2017 0824   ALT 16 07/30/2017 1240   ALT 19 04/30/2017 0824   BILITOT 0.3 07/30/2017 1240   BILITOT 0.57 04/30/2017 0824       RADIOGRAPHIC STUDIES:  No results found.   ASSESSMENT/PLAN:  Adenocarcinoma of right lung, stage 4 (HCC) This is a very pleasant 77 year old white male with unresectable a stage II a non-small cell lung cancer, adenocarcinoma presented with right hilar mass status post a course of concurrent chemoradiation with weekly carboplatin and paclitaxel. He has partial response to this treatment. The patient was then on observation for a few months. The patient developed disease progression and he was started on systemic chemotherapy with carboplatin, Alimta and  Avastin status post6cycles. The patient tolerated the previous treatment fairly well except for increasing fatigue. Repeat CT scan of the chest, abdomen and pelvis showed a stable disease. He has no metastatic or progressive lesions. The patient is now on maintenance treatment with Alimta 400 mg/m and Avastin as per kilogram given every 3 weeks.  Status post 1 cycle of his treatment.  Cycle 2 is been delayed due to fatigue per the patient request.  The patient was seen with Dr. Julien Nordmann.  We discussed considering dropping the Alimta if his fatigue and generalized weakness persist.  However, the patient's fatigue has improved.  He is ready to begin his second cycle of treatment.  Patient will proceed with cycle 2 as scheduled today.  He will come back for follow-up visit in 3 weeks for evaluation prior to cycle #3 of his maintenance treatment.  The patient was advised to call immediately if he has any concerning symptoms in the interval. The patient voices understanding of current disease status and treatment options and is in agreement with the current care plan. All questions were answered. The patient knows to call the clinic with any problems, questions or concerns. We can certainly see the patient much sooner if necessary.   No orders of the defined types were placed in this encounter.  Mikey Bussing, DNP, AGPCNP-BC, AOCNP 07/30/17  ADDENDUM: Hematology/Oncology Attending: I had a face-to-face encounter with the patient.  I recommended his care plan.  This is a very pleasant 77 years old white male with metastatic non-small cell lung cancer currently undergoing maintenance treatment with Alimta and Avastin status post 1 cycle.  He tolerated the first cycle of this treatment well except for fatigue.  He was able to work and mow the yard recently.  I recommended for the patient to proceed with cycle #2 today.  If he has significant fatigue  and weakness with the combination of Alimta and  Avastin, I may consider discontinuing the treatment with Alimta starting from the next cycle. I would see the patient back for follow-up visit in 3 weeks for reevaluation before starting cycle #3. The patient was advised to call immediately if he has any concerning symptoms in the interval.  Disclaimer: This note was dictated with voice recognition software. Similar sounding words can inadvertently be transcribed and may be missed upon review. Eilleen Kempf, MD 07/30/17

## 2017-07-30 NOTE — Telephone Encounter (Signed)
appt per 4/1 los - 3 cycles already scheduled

## 2017-07-30 NOTE — Assessment & Plan Note (Signed)
This is a very pleasant 77 year old white male with unresectable a stage II a non-small cell lung cancer, adenocarcinoma presented with right hilar mass status post a course of concurrent chemoradiation with weekly carboplatin and paclitaxel. He has partial response to this treatment. The patient was then on observation for a few months. The patient developed disease progression and he was started on systemic chemotherapy with carboplatin, Alimta and Avastin status post6cycles. The patient tolerated the previous treatment fairly well except for increasing fatigue. Repeat CT scan of the chest, abdomen and pelvis showed a stable disease. He has no metastatic or progressive lesions. The patient is now on maintenance treatment with Alimta 400 mg/m and Avastin as per kilogram given every 3 weeks.  Status post 1 cycle of his treatment.  Cycle 2 is been delayed due to fatigue per the patient request.  The patient was seen with Dr. Julien Nordmann.  We discussed considering dropping the Alimta if his fatigue and generalized weakness persist.  However, the patient's fatigue has improved.  He is ready to begin his second cycle of treatment.  Patient will proceed with cycle 2 as scheduled today.  He will come back for follow-up visit in 3 weeks for evaluation prior to cycle #3 of his maintenance treatment.  The patient was advised to call immediately if he has any concerning symptoms in the interval. The patient voices understanding of current disease status and treatment options and is in agreement with the current care plan. All questions were answered. The patient knows to call the clinic with any problems, questions or concerns. We can certainly see the patient much sooner if necessary.

## 2017-08-02 ENCOUNTER — Other Ambulatory Visit: Payer: Self-pay | Admitting: Internal Medicine

## 2017-08-07 ENCOUNTER — Other Ambulatory Visit: Payer: Medicare Other

## 2017-08-07 ENCOUNTER — Ambulatory Visit: Payer: Medicare Other

## 2017-08-07 ENCOUNTER — Ambulatory Visit: Payer: Medicare Other | Admitting: Oncology

## 2017-08-09 ENCOUNTER — Inpatient Hospital Stay: Payer: Medicare Other

## 2017-08-09 ENCOUNTER — Telehealth: Payer: Self-pay | Admitting: *Deleted

## 2017-08-09 ENCOUNTER — Telehealth: Payer: Self-pay | Admitting: Internal Medicine

## 2017-08-09 ENCOUNTER — Other Ambulatory Visit: Payer: Self-pay | Admitting: *Deleted

## 2017-08-09 DIAGNOSIS — C3491 Malignant neoplasm of unspecified part of right bronchus or lung: Secondary | ICD-10-CM

## 2017-08-09 DIAGNOSIS — Z5112 Encounter for antineoplastic immunotherapy: Secondary | ICD-10-CM | POA: Diagnosis not present

## 2017-08-09 LAB — CBC WITH DIFFERENTIAL (CANCER CENTER ONLY)
Basophils Absolute: 0 10*3/uL (ref 0.0–0.1)
Basophils Relative: 0 %
EOS ABS: 0.1 10*3/uL (ref 0.0–0.5)
Eosinophils Relative: 1 %
HEMATOCRIT: 30.4 % — AB (ref 38.4–49.9)
HEMOGLOBIN: 9.8 g/dL — AB (ref 13.0–17.1)
LYMPHS ABS: 0.7 10*3/uL — AB (ref 0.9–3.3)
Lymphocytes Relative: 16 %
MCH: 30 pg (ref 27.2–33.4)
MCHC: 32.2 g/dL (ref 32.0–36.0)
MCV: 93 fL (ref 79.3–98.0)
MONOS PCT: 19 %
Monocytes Absolute: 0.8 10*3/uL (ref 0.1–0.9)
NEUTROS PCT: 64 %
Neutro Abs: 2.6 10*3/uL (ref 1.5–6.5)
Platelet Count: 79 10*3/uL — ABNORMAL LOW (ref 140–400)
RBC: 3.27 MIL/uL — AB (ref 4.20–5.82)
RDW: 20.5 % — ABNORMAL HIGH (ref 11.0–14.6)
WBC: 4.1 10*3/uL (ref 4.0–10.3)

## 2017-08-09 LAB — SAMPLE TO BLOOD BANK

## 2017-08-09 LAB — CMP (CANCER CENTER ONLY)
ALK PHOS: 87 U/L (ref 40–150)
ALT: 35 U/L (ref 0–55)
ANION GAP: 13 — AB (ref 3–11)
AST: 40 U/L — ABNORMAL HIGH (ref 5–34)
Albumin: 3.3 g/dL — ABNORMAL LOW (ref 3.5–5.0)
BILIRUBIN TOTAL: 0.4 mg/dL (ref 0.2–1.2)
BUN: 20 mg/dL (ref 7–26)
CALCIUM: 8.1 mg/dL — AB (ref 8.4–10.4)
CO2: 22 mmol/L (ref 22–29)
CREATININE: 1.04 mg/dL (ref 0.70–1.30)
Chloride: 104 mmol/L (ref 98–109)
GFR, Estimated: >60 mL/min (ref >60)
GLUCOSE: 133 mg/dL (ref 70–140)
Potassium: 3.8 mmol/L (ref 3.5–5.1)
SODIUM: 139 mmol/L (ref 136–145)
TOTAL PROTEIN: 7.5 g/dL (ref 6.4–8.3)

## 2017-08-09 NOTE — Telephone Encounter (Signed)
Wife called states Pt PCP took labs on Monday hgb 9.8 and told us to call Dr. Julien Nordmann. Pt has shortness of breath, " panting after about 10 steps" Denies fever, chills n/v,night sweat, difficulty swallowing, headaches, or falls. Will review with MD

## 2017-08-09 NOTE — Telephone Encounter (Signed)
Scheduled appt per 4/11 sch msg - spoke with patient regarding his appt.

## 2017-08-20 ENCOUNTER — Other Ambulatory Visit: Payer: Self-pay | Admitting: Medical Oncology

## 2017-08-20 DIAGNOSIS — C3491 Malignant neoplasm of unspecified part of right bronchus or lung: Secondary | ICD-10-CM

## 2017-08-21 ENCOUNTER — Inpatient Hospital Stay (HOSPITAL_BASED_OUTPATIENT_CLINIC_OR_DEPARTMENT_OTHER): Payer: Medicare Other | Admitting: Internal Medicine

## 2017-08-21 ENCOUNTER — Encounter: Payer: Self-pay | Admitting: Internal Medicine

## 2017-08-21 ENCOUNTER — Inpatient Hospital Stay: Payer: Medicare Other

## 2017-08-21 ENCOUNTER — Telehealth: Payer: Self-pay | Admitting: Internal Medicine

## 2017-08-21 VITALS — BP 125/84 | HR 95 | Temp 97.7°F | Resp 18 | Ht 73.5 in | Wt 170.2 lb

## 2017-08-21 DIAGNOSIS — Z5111 Encounter for antineoplastic chemotherapy: Secondary | ICD-10-CM

## 2017-08-21 DIAGNOSIS — I10 Essential (primary) hypertension: Secondary | ICD-10-CM | POA: Diagnosis not present

## 2017-08-21 DIAGNOSIS — Z5112 Encounter for antineoplastic immunotherapy: Secondary | ICD-10-CM | POA: Diagnosis not present

## 2017-08-21 DIAGNOSIS — T451X5A Adverse effect of antineoplastic and immunosuppressive drugs, initial encounter: Secondary | ICD-10-CM

## 2017-08-21 DIAGNOSIS — D6481 Anemia due to antineoplastic chemotherapy: Secondary | ICD-10-CM

## 2017-08-21 DIAGNOSIS — C3490 Malignant neoplasm of unspecified part of unspecified bronchus or lung: Secondary | ICD-10-CM

## 2017-08-21 DIAGNOSIS — R062 Wheezing: Secondary | ICD-10-CM | POA: Diagnosis not present

## 2017-08-21 DIAGNOSIS — Z95828 Presence of other vascular implants and grafts: Secondary | ICD-10-CM

## 2017-08-21 DIAGNOSIS — C3491 Malignant neoplasm of unspecified part of right bronchus or lung: Secondary | ICD-10-CM

## 2017-08-21 LAB — CMP (CANCER CENTER ONLY)
ALBUMIN: 3.7 g/dL (ref 3.5–5.0)
ALT: 21 U/L (ref 0–55)
AST: 26 U/L (ref 5–34)
Alkaline Phosphatase: 75 U/L (ref 40–150)
Anion gap: 15 — ABNORMAL HIGH (ref 3–11)
BUN: 24 mg/dL (ref 7–26)
CHLORIDE: 103 mmol/L (ref 98–109)
CO2: 21 mmol/L — AB (ref 22–29)
CREATININE: 1.17 mg/dL (ref 0.70–1.30)
Calcium: 7.7 mg/dL — ABNORMAL LOW (ref 8.4–10.4)
GFR, Est AFR Am: >60 mL/min (ref >60)
GFR, Estimated: 58 mL/min — ABNORMAL LOW (ref >60)
GLUCOSE: 207 mg/dL — AB (ref 70–140)
Potassium: 4.8 mmol/L (ref 3.5–5.1)
SODIUM: 139 mmol/L (ref 136–145)
Total Bilirubin: 0.4 mg/dL (ref 0.2–1.2)
Total Protein: 7.8 g/dL (ref 6.4–8.3)

## 2017-08-21 LAB — CBC WITH DIFFERENTIAL (CANCER CENTER ONLY)
Basophils Absolute: 0 10*3/uL (ref 0.0–0.1)
Basophils Relative: 0 %
EOS ABS: 0 10*3/uL (ref 0.0–0.5)
EOS PCT: 0 %
HCT: 31.8 % — ABNORMAL LOW (ref 38.4–49.9)
Hemoglobin: 10 g/dL — ABNORMAL LOW (ref 13.0–17.1)
LYMPHS ABS: 0.7 10*3/uL — AB (ref 0.9–3.3)
Lymphocytes Relative: 8 %
MCH: 30.2 pg (ref 27.2–33.4)
MCHC: 31.4 g/dL — ABNORMAL LOW (ref 32.0–36.0)
MCV: 96.1 fL (ref 79.3–98.0)
MONOS PCT: 8 %
Monocytes Absolute: 0.7 10*3/uL (ref 0.1–0.9)
Neutro Abs: 7 10*3/uL — ABNORMAL HIGH (ref 1.5–6.5)
Neutrophils Relative %: 84 %
PLATELETS: 135 10*3/uL — AB (ref 140–400)
RBC: 3.31 MIL/uL — AB (ref 4.20–5.82)
RDW: 21.9 % — ABNORMAL HIGH (ref 11.0–14.6)
WBC: 8.3 10*3/uL (ref 4.0–10.3)

## 2017-08-21 MED ORDER — SODIUM CHLORIDE 0.9% FLUSH
10.0000 mL | Freq: Once | INTRAVENOUS | Status: AC
  Administered 2017-08-21: 10 mL
  Filled 2017-08-21: qty 10

## 2017-08-21 MED ORDER — PROCHLORPERAZINE MALEATE 10 MG PO TABS
10.0000 mg | ORAL_TABLET | Freq: Once | ORAL | Status: AC
  Administered 2017-08-21: 10 mg via ORAL

## 2017-08-21 MED ORDER — PROCHLORPERAZINE MALEATE 10 MG PO TABS
ORAL_TABLET | ORAL | Status: AC
  Filled 2017-08-21: qty 1

## 2017-08-21 MED ORDER — BEVACIZUMAB CHEMO INJECTION 400 MG/16ML
15.9000 mg/kg | Freq: Once | INTRAVENOUS | Status: AC
  Administered 2017-08-21: 1200 mg via INTRAVENOUS
  Filled 2017-08-21: qty 48

## 2017-08-21 MED ORDER — SODIUM CHLORIDE 0.9% FLUSH
10.0000 mL | INTRAVENOUS | Status: DC | PRN
  Administered 2017-08-21: 10 mL
  Filled 2017-08-21: qty 10

## 2017-08-21 MED ORDER — HEPARIN SOD (PORK) LOCK FLUSH 100 UNIT/ML IV SOLN
500.0000 [IU] | Freq: Once | INTRAVENOUS | Status: AC | PRN
  Administered 2017-08-21: 500 [IU]
  Filled 2017-08-21: qty 5

## 2017-08-21 MED ORDER — PEMETREXED DISODIUM CHEMO INJECTION 500 MG
410.0000 mg/m2 | Freq: Once | INTRAVENOUS | Status: AC
  Administered 2017-08-21: 800 mg via INTRAVENOUS
  Filled 2017-08-21: qty 20

## 2017-08-21 MED ORDER — SODIUM CHLORIDE 0.9 % IV SOLN
Freq: Once | INTRAVENOUS | Status: AC
  Administered 2017-08-21: 13:00:00 via INTRAVENOUS

## 2017-08-21 NOTE — Telephone Encounter (Signed)
Scheduled appt per 4/23 los - Gave patient aVS and calender per los. Central radiology to contact patient with ct schedule.

## 2017-08-21 NOTE — Patient Instructions (Signed)

## 2017-08-21 NOTE — Patient Instructions (Signed)
[facility] Discharge Instructions for Patients Receiving Chemotherapy  Today you received the following chemotherapy agents Alimta; Avastin  To help prevent nausea and vomiting after your treatment, we encourage you to take your nausea medication as directed   If you develop nausea and vomiting that is not controlled by your nausea medication, call the clinic.   BELOW ARE SYMPTOMS THAT SHOULD BE REPORTED IMMEDIATELY:  *FEVER GREATER THAN 100.5 F  *CHILLS WITH OR WITHOUT FEVER  NAUSEA AND VOMITING THAT IS NOT CONTROLLED WITH YOUR NAUSEA MEDICATION  *UNUSUAL SHORTNESS OF BREATH  *UNUSUAL BRUISING OR BLEEDING  TENDERNESS IN MOUTH AND THROAT WITH OR WITHOUT PRESENCE OF ULCERS  *URINARY PROBLEMS  *BOWEL PROBLEMS  UNUSUAL RASH Items with * indicate a potential emergency and should be followed up as soon as possible.  Feel free to call the clinic should you have any questions or concerns. The clinic phone number is [phone].  Please show the [facility] at check-in to the Emergency Department and triage nurse.

## 2017-08-21 NOTE — Progress Notes (Signed)
[place] [phone]   [phone]  OFFICE PROGRESS NOTE  [name], MD [place] [address]  DIAGNOSIS: Metastatic non-small cell lung cancer, adenocarcinoma initially diagnosed as Unresectable stage IIA (T1a, N1, M0) non-small cell lung cancer, adenocarcinoma presented with a right hilar mass and right hilar adenopathy diagnosed in September 2017. [facility] 360: Negative for EGFR, ALK, ROS 1 and BRAF mutations.  PRIOR THERAPY:  1) Status post concurrent chemoradiation with weekly carboplatin for AUC of 2 and paclitaxel 45 MG/M2 for 7 weeks. 2) Systemic chemotherapy with carboplatin for AUC of 5, Alimta 500 MG/M2 and Avastin 15 MG/KG every 3 weeks. First dose 01/08/2017. Status post 6 cycles.  Last dose was given April 30, 2002 stable disease.   CURRENT THERAPY: Maintenance treatment with Alimta 500 mg/M2 and Avastin 15 mg/KG every 3 weeks.  First dose June 11, 2017.  Status post 2 cycles.  INTERVAL HISTORY: Mark Howell 78 y.o. male returns to the clinic today for follow-up visit accompanied by his wife.  The patient has no complaints today except for fatigue and shortness of breath.  He has a lot of seasonal allergy and wheezing.  He denied having any chest pain or hemoptysis.  He tolerated the last cycle of his treatment with maintenance Alimta and Avastin fairly well except for shortness of breath the last 2 days.  He denied having any significant nausea, vomiting, diarrhea or constipation.  He denied having any fever or chills.  He is here today for evaluation before starting cycle #3 of his maintenance treatment.   MEDICAL HISTORY: Past Medical History:  Diagnosis Date  . Arthritis   . Complication of anesthesia    problems voiding post op  . Full dentures   . GERD (gastroesophageal reflux disease)   . History of radiation therapy 01/25/16-02/28/16   right lung 45 Gy  . History of radiation therapy  04/17/16-05/02/16   right lung boost 20 Gy in 10 fractions cumulative dose 65 gray  . HOH (hard of hearing)   . HTN (hypertension)   . Hyperlipidemia   . Lung mass 12/30/2015  . Odynophagia 04/06/2016  . Psoriatic arthritis ([place])   . Snores   . Wears glasses     ALLERGIES:  is allergic to hydrocodone and oxycodone.  MEDICATIONS:  Current Outpatient Medications  Medication Sig Dispense Refill  . albuterol (VENTOLIN HFA) 108 (90 Base) MCG/ACT inhaler Inhale 2 puffs into the lungs every 6 (six) hours as needed for wheezing or shortness of breath.     Marland Kitchen aspirin EC 81 MG tablet Take 81 mg by mouth at bedtime.     . blood glucose meter kit and supplies KIT Dispense based on patient and insurance preference. Use up to four times daily as directed. (FOR ICD-9 250.00, 250.01). 1 each 0  . calcium carbonate (TUMS - DOSED IN MG ELEMENTAL CALCIUM) 500 MG chewable tablet Chew 1 tablet by mouth daily.    . Cetirizine HCl 10 MG TBDP Take 1 tablet by mouth daily.     . Coenzyme Q10 (CO Q 10 PO) Take 1 tablet by mouth daily.    . Continuous Blood Gluc Sensor MISC 1 each by Does not apply route as directed. Use as directed every 10 days. May dispense FreeStyle [employer] or similar. 2 each 2  . dexamethasone (DECADRON) 4 MG tablet TAKE 1 TABLET TWICE DAILY FOR 3 DAYS (DAY BEFORE, DAY OF, AND DAY AFTER CHEMO)  40 tablet 0  . dexamethasone (DECADRON) 4 MG tablet 4 mg by mouth twice a day the day before, day of and day after chemotherapy every 3 weeks. 40 tablet 1  . Dextromethorphan-Guaifenesin (DELSYM COUGH/CHEST CONGEST DM PO) Take 30 mLs by mouth daily as needed (cold symptoms).    . diphenhydramine-acetaminophen (TYLENOL PM) 25-500 MG TABS tablet Take 1 tablet by mouth at bedtime as needed (sleep).    . diphenoxylate-atropine (LOMOTIL) 2.5-0.025 MG tablet Take 2 tablets by mouth 4 (four) times daily as needed for diarrhea or loose stools. 40 tablet 1  . fluticasone-salmeterol (ADVAIR HFA) 115-21  MCG/ACT inhaler Inhale 2 puffs into the lungs 2 (two) times daily. 1 Inhaler 12  . folic acid (FOLVITE) 1 MG tablet Take 1 tablet (1 mg total) by mouth daily. 30 tablet 2  . folic acid (FOLVITE) 1 MG tablet TAKE 1 TABLET DAILY 30 tablet 0  . glipiZIDE (GLUCOTROL) 10 MG tablet Take 1 tablet (10 mg total) by mouth daily before breakfast. (Patient taking differently: Take 10 mg by mouth daily before breakfast. ) 30 tablet 1  . guaiFENesin (MUCINEX) 600 MG 12 hr tablet Take 1 tablet (600 mg total) by mouth 2 (two) times daily. (Patient not taking: Reported on 07/30/2017) 60 tablet 0  . lidocaine (XYLOCAINE) 2 % solution Use as directed 20 mLs in the mouth or throat every 3 (three) hours as needed for mouth pain. 100 mL 2  . lidocaine-prilocaine (EMLA) cream Apply 1 application topically as needed. 30 g 0  . loperamide (IMODIUM A-D) 2 MG tablet Take 2 mg by mouth daily as needed for diarrhea or loose stools.    Marland Kitchen LORazepam (ATIVAN) 0.5 MG tablet Take 1 tablet (0.5 mg total) by mouth 2 (two) times daily as needed for anxiety. 30 tablet 0  . Lutein 10 MG TABS Take 10 mg by mouth at bedtime.     . magic mouthwash SOLN Take 5 mLs by mouth 4 (four) times daily as needed for mouth pain. (Patient taking differently: Take 5 mLs by mouth 4 (four) times daily as needed for mouth pain. SWISH AND SPIT OUT) 240 mL 2  . Methylcobalamin (METHYL B-12 PO) Take 1 tablet by mouth daily.    . metoprolol tartrate (LOPRESSOR) 25 MG tablet Take 0.5 tablets (12.5 mg total) by mouth 2 (two) times daily. (Patient not taking: Reported on 07/30/2017) 60 tablet 0  . mirtazapine (REMERON) 15 MG tablet Take 15 mg by mouth at bedtime.    . Multiple Vitamin (MULITIVITAMIN WITH MINERALS) TABS Take 1 tablet by mouth daily.    . pantoprazole (PROTONIX) 40 MG tablet Take 40 mg by mouth daily.     . pravastatin (PRAVACHOL) 40 MG tablet Take 40 mg by mouth at bedtime.     . predniSONE (DELTASONE) 5 MG tablet Take 5 mg by mouth daily with  breakfast.    . predniSONE (DELTASONE) 5 MG tablet Take 3 tabs x 3 days, then 2 tabs x 3 days, then stop, monitor blood glucose closely (Patient not taking: Reported on 07/30/2017) 15 tablet 0  . prochlorperazine (COMPAZINE) 10 MG tablet Take 1 tablet (10 mg total) by mouth every 6 (six) hours as needed for nausea or vomiting. (Patient not taking: Reported on 07/30/2017) 30 tablet 0  . tamsulosin (FLOMAX) 0.4 MG CAPS capsule TAKE (1) CAPSULE DAILY, START 4 DAYS BEFORE PROCEDURE (Patient taking differently: take 1 capsule by mouth every morning) 30 capsule 2  . triamcinolone (NASACORT) 55 MCG/ACT  AERO nasal inhaler Place 1 spray into the nose at bedtime.      No current facility-administered medications for this visit.     SURGICAL HISTORY:  Past Surgical History:  Procedure Laterality Date  . COLONOSCOPY    . HEMORRHOID SURGERY N/A 10/01/2013   Procedure: EXAM UNDER ANESTHESIA  AND EXCISIONAL HEMORRHOIDECTOMY WITH HEMORRHOID BANDING X 2;  Surgeon: Gayland Curry, MD;  Location: [facility];  Service: General;  Laterality: N/A;  . INGUINAL HERNIA REPAIR  01/04/2012   Procedure: LAPAROSCOPIC INGUINAL HERNIA;  Surgeon: Gayland Curry, MD,FACS;  Location: [facility];  Service: General;  Laterality: Right;  . IR FLUORO GUIDE PORT INSERTION RIGHT  01/02/2017  . IR US GUIDE VASC ACCESS RIGHT  01/02/2017  . VIDEO BRONCHOSCOPY WITH ENDOBRONCHIAL ULTRASOUND N/A 12/31/2015   Procedure: VIDEO BRONCHOSCOPY WITH ENDOBRONCHIAL ULTRASOUND transbronchial biopsy of node 10 R lymph node;  Surgeon: Grace Isaac, MD;  Location: [facility];  Service: Thoracic;  Laterality: N/A;    REVIEW OF SYSTEMS:  A comprehensive review of systems was negative except for: Constitutional: positive for fatigue Respiratory: positive for cough and dyspnea on exertion Musculoskeletal: positive for muscle weakness   PHYSICAL EXAMINATION: General appearance: alert, cooperative, fatigued and no distress Head: Normocephalic, without  obvious abnormality, atraumatic Neck: no adenopathy, no JVD, supple, symmetrical, trachea midline and thyroid not enlarged, symmetric, no tenderness/mass/nodules Lymph nodes: Cervical, supraclavicular, and axillary nodes normal. Resp: wheezes bilaterally Back: symmetric, no curvature. ROM normal. No CVA tenderness. Cardio: regular rate and rhythm, S1, S2 normal, no murmur, click, rub or gallop GI: soft, non-tender; bowel sounds normal; no masses,  no organomegaly Extremities: extremities normal, atraumatic, no cyanosis or edema  ECOG PERFORMANCE STATUS: 1 - Symptomatic but completely ambulatory  Blood pressure 125/84, pulse 95, temperature 97.7 F (36.5 C), temperature source Oral, resp. rate 18, height 6' 1.5" (1.867 m), weight 170 lb 3.2 oz (77.2 kg), SpO2 97 %.  LABORATORY DATA: Lab Results  Component Value Date   WBC 8.3 08/21/2017   HGB 10.0 (L) 08/21/2017   HCT 31.8 (L) 08/21/2017   MCV 96.1 08/21/2017   PLT 135 (L) 08/21/2017      Chemistry      Component Value Date/Time   NA 139 08/21/2017 1019   NA 135 (L) 04/30/2017 0824   K 4.8 08/21/2017 1019   K 4.4 04/30/2017 0824   CL 103 08/21/2017 1019   CO2 21 (L) 08/21/2017 1019   CO2 19 (L) 04/30/2017 0824   BUN 24 08/21/2017 1019   BUN 28.0 (H) 04/30/2017 0824   CREATININE 1.17 08/21/2017 1019   CREATININE 1.5 (H) 04/30/2017 0824      Component Value Date/Time   CALCIUM 7.7 (L) 08/21/2017 1019   CALCIUM 7.6 (L) 04/30/2017 0824   ALKPHOS 75 08/21/2017 1019   ALKPHOS 88 04/30/2017 0824   AST 26 08/21/2017 1019   AST 22 04/30/2017 0824   ALT 21 08/21/2017 1019   ALT 19 04/30/2017 0824   BILITOT 0.4 08/21/2017 1019   BILITOT 0.57 04/30/2017 0824       RADIOGRAPHIC STUDIES: No results found.  ASSESSMENT AND PLAN:  This is a very pleasant 77 years old white male with unresectable a stage II a non-small cell lung cancer, adenocarcinoma presented with right hilar mass status post a course of concurrent  chemoradiation with weekly carboplatin and paclitaxel. He has partial response to this treatment. The patient has been observation for the last few months. The patient  developed disease progression and he was started on systemic chemotherapy with carboplatin, Alimta and Avastin status post 6 cycles. The patient tolerated the previous treatment fairly well except for increasing fatigue. The patient is currently undergoing maintenance treatment with Alimta and Avastin status post 2 cycles.  He is tolerating the treatment well.  I recommended for him to proceed with cycle #3 today as scheduled. I will see him back for follow-up visit in 3 weeks for evaluation after repeating CT scan of the chest, abdomen and pelvis for restaging of his disease before starting cycle #4. For the wheezing and seasonal allergies the patient will continue with his current treatment with the inhaler as prescribed by his pulmonologist. He was advised to call immediately if he has any concerning symptoms in the interval. The patient voices understanding of current disease status and treatment options and is in agreement with the current care plan. All questions were answered. The patient knows to call the clinic with any problems, questions or concerns. We can certainly see the patient much sooner if necessary.  Disclaimer: This note was dictated with voice recognition software. Similar sounding words can inadvertently be transcribed and may not be corrected upon review.

## 2017-08-24 ENCOUNTER — Encounter: Payer: Self-pay | Admitting: Internal Medicine

## 2017-08-24 NOTE — Progress Notes (Signed)
Patient's spouse called and left voicemail today to discuss financial needs.  Returned call and introduced myself as Arboriculturist. Asked about financial concerns. She states main needs are gas due to distance traveling. Advised her they can apply for the one-time $400 [place] that can assist with this need as well as other personal household expenses. Asked if they can bring proof of income at next visit to apply on 09/11/17. She states they can. Gave her the income guideline for a household of 2. She states they fall below this guideline. Will see them on 09/11/17 after infusion. She verbalized understanding and has my contact name and number for any additional financial questions or concerns.

## 2017-08-27 ENCOUNTER — Other Ambulatory Visit: Payer: Self-pay | Admitting: Internal Medicine

## 2017-08-28 ENCOUNTER — Ambulatory Visit: Payer: Medicare Other

## 2017-08-28 ENCOUNTER — Other Ambulatory Visit: Payer: Medicare Other

## 2017-09-07 ENCOUNTER — Encounter (HOSPITAL_COMMUNITY): Payer: Self-pay

## 2017-09-07 ENCOUNTER — Ambulatory Visit (HOSPITAL_COMMUNITY)
Admission: RE | Admit: 2017-09-07 | Discharge: 2017-09-07 | Disposition: A | Discharge status: Home / Self Care | Payer: Medicare Other | Source: Ambulatory Visit | Attending: Internal Medicine | Admitting: Internal Medicine

## 2017-09-07 DIAGNOSIS — J439 Emphysema, unspecified: Secondary | ICD-10-CM | POA: Diagnosis not present

## 2017-09-07 DIAGNOSIS — C3490 Malignant neoplasm of unspecified part of unspecified bronchus or lung: Secondary | ICD-10-CM | POA: Insufficient documentation

## 2017-09-07 DIAGNOSIS — I2510 Atherosclerotic heart disease of native coronary artery without angina pectoris: Secondary | ICD-10-CM | POA: Diagnosis not present

## 2017-09-07 DIAGNOSIS — I700 Atherosclerosis of aorta: Secondary | ICD-10-CM | POA: Insufficient documentation

## 2017-09-07 DIAGNOSIS — N200 Calculus of kidney: Secondary | ICD-10-CM | POA: Insufficient documentation

## 2017-09-07 DIAGNOSIS — R918 Other nonspecific abnormal finding of lung field: Secondary | ICD-10-CM | POA: Insufficient documentation

## 2017-09-07 DIAGNOSIS — N400 Enlarged prostate without lower urinary tract symptoms: Secondary | ICD-10-CM | POA: Insufficient documentation

## 2017-09-07 DIAGNOSIS — C7972 Secondary malignant neoplasm of left adrenal gland: Secondary | ICD-10-CM | POA: Diagnosis not present

## 2017-09-07 DIAGNOSIS — Z923 Personal history of irradiation: Secondary | ICD-10-CM | POA: Diagnosis not present

## 2017-09-07 DIAGNOSIS — C7971 Secondary malignant neoplasm of right adrenal gland: Secondary | ICD-10-CM | POA: Diagnosis not present

## 2017-09-07 HISTORY — DX: Malignant (primary) neoplasm, unspecified: C80.1

## 2017-09-07 HISTORY — DX: Type 2 diabetes mellitus without complications: E11.9

## 2017-09-07 MED ORDER — IOHEXOL 300 MG/ML  SOLN
100.0000 mL | Freq: Once | INTRAMUSCULAR | Status: AC | PRN
  Administered 2017-09-07: 100 mL via INTRAVENOUS

## 2017-09-10 ENCOUNTER — Other Ambulatory Visit: Payer: Self-pay | Admitting: Medical Oncology

## 2017-09-10 DIAGNOSIS — C3491 Malignant neoplasm of unspecified part of right bronchus or lung: Secondary | ICD-10-CM

## 2017-09-11 ENCOUNTER — Inpatient Hospital Stay: Payer: Medicare Other

## 2017-09-11 ENCOUNTER — Inpatient Hospital Stay: Payer: Medicare Other | Attending: Internal Medicine

## 2017-09-11 ENCOUNTER — Encounter: Payer: Self-pay | Admitting: Oncology

## 2017-09-11 ENCOUNTER — Inpatient Hospital Stay (HOSPITAL_BASED_OUTPATIENT_CLINIC_OR_DEPARTMENT_OTHER): Payer: Medicare Other | Admitting: Oncology

## 2017-09-11 ENCOUNTER — Other Ambulatory Visit: Payer: Self-pay | Admitting: *Deleted

## 2017-09-11 VITALS — BP 124/90 | HR 98 | Temp 97.9°F | Resp 18

## 2017-09-11 DIAGNOSIS — I10 Essential (primary) hypertension: Secondary | ICD-10-CM | POA: Insufficient documentation

## 2017-09-11 DIAGNOSIS — C3401 Malignant neoplasm of right main bronchus: Secondary | ICD-10-CM

## 2017-09-11 DIAGNOSIS — C3491 Malignant neoplasm of unspecified part of right bronchus or lung: Secondary | ICD-10-CM

## 2017-09-11 DIAGNOSIS — R0600 Dyspnea, unspecified: Secondary | ICD-10-CM | POA: Insufficient documentation

## 2017-09-11 DIAGNOSIS — J449 Chronic obstructive pulmonary disease, unspecified: Secondary | ICD-10-CM

## 2017-09-11 DIAGNOSIS — Z5111 Encounter for antineoplastic chemotherapy: Secondary | ICD-10-CM

## 2017-09-11 DIAGNOSIS — R062 Wheezing: Secondary | ICD-10-CM | POA: Insufficient documentation

## 2017-09-11 DIAGNOSIS — C7972 Secondary malignant neoplasm of left adrenal gland: Secondary | ICD-10-CM

## 2017-09-11 DIAGNOSIS — E119 Type 2 diabetes mellitus without complications: Secondary | ICD-10-CM

## 2017-09-11 DIAGNOSIS — J439 Emphysema, unspecified: Secondary | ICD-10-CM | POA: Diagnosis not present

## 2017-09-11 DIAGNOSIS — R0602 Shortness of breath: Secondary | ICD-10-CM | POA: Insufficient documentation

## 2017-09-11 DIAGNOSIS — E860 Dehydration: Secondary | ICD-10-CM | POA: Diagnosis not present

## 2017-09-11 DIAGNOSIS — Z79899 Other long term (current) drug therapy: Secondary | ICD-10-CM | POA: Diagnosis not present

## 2017-09-11 DIAGNOSIS — Z5112 Encounter for antineoplastic immunotherapy: Secondary | ICD-10-CM | POA: Diagnosis present

## 2017-09-11 DIAGNOSIS — Z87891 Personal history of nicotine dependence: Secondary | ICD-10-CM | POA: Diagnosis not present

## 2017-09-11 DIAGNOSIS — C7971 Secondary malignant neoplasm of right adrenal gland: Secondary | ICD-10-CM | POA: Insufficient documentation

## 2017-09-11 DIAGNOSIS — Z95828 Presence of other vascular implants and grafts: Secondary | ICD-10-CM

## 2017-09-11 LAB — CBC WITH DIFFERENTIAL (CANCER CENTER ONLY)
BASOS ABS: 0 10*3/uL (ref 0.0–0.1)
Basophils Relative: 0 %
EOS ABS: 0 10*3/uL (ref 0.0–0.5)
Eosinophils Relative: 0 %
HCT: 31.5 % — ABNORMAL LOW (ref 38.4–49.9)
HEMOGLOBIN: 10.2 g/dL — AB (ref 13.0–17.1)
LYMPHS ABS: 0.6 10*3/uL — AB (ref 0.9–3.3)
Lymphocytes Relative: 6 %
MCH: 30.9 pg (ref 27.2–33.4)
MCHC: 32.4 g/dL (ref 32.0–36.0)
MCV: 95.5 fL (ref 79.3–98.0)
Monocytes Absolute: 0.3 10*3/uL (ref 0.1–0.9)
Monocytes Relative: 4 %
NEUTROS PCT: 90 %
Neutro Abs: 8.9 10*3/uL — ABNORMAL HIGH (ref 1.5–6.5)
Platelet Count: 153 10*3/uL (ref 140–400)
RBC: 3.3 MIL/uL — AB (ref 4.20–5.82)
RDW: 21.3 % — ABNORMAL HIGH (ref 11.0–14.6)
WBC: 9.8 10*3/uL (ref 4.0–10.3)

## 2017-09-11 LAB — CMP (CANCER CENTER ONLY)
ALT: 19 U/L (ref 0–55)
AST: 26 U/L (ref 5–34)
Albumin: 3.6 g/dL (ref 3.5–5.0)
Alkaline Phosphatase: 85 U/L (ref 40–150)
Anion gap: 13 — ABNORMAL HIGH (ref 3–11)
BUN: 40 mg/dL — ABNORMAL HIGH (ref 7–26)
CHLORIDE: 102 mmol/L (ref 98–109)
CO2: 20 mmol/L — ABNORMAL LOW (ref 22–29)
CREATININE: 1.4 mg/dL — AB (ref 0.70–1.30)
Calcium: 8.5 mg/dL (ref 8.4–10.4)
GFR, EST AFRICAN AMERICAN: 54 mL/min — AB (ref >60)
GFR, EST NON AFRICAN AMERICAN: 47 mL/min — AB (ref >60)
Glucose, Bld: 286 mg/dL — ABNORMAL HIGH (ref 70–140)
POTASSIUM: 5 mmol/L (ref 3.5–5.1)
Sodium: 135 mmol/L — ABNORMAL LOW (ref 136–145)
Total Bilirubin: 0.3 mg/dL (ref 0.2–1.2)
Total Protein: 7.9 g/dL (ref 6.4–8.3)

## 2017-09-11 LAB — TOTAL PROTEIN, URINE DIPSTICK: Protein, ur: <30 mg/dL — AB

## 2017-09-11 MED ORDER — CYANOCOBALAMIN 1000 MCG/ML IJ SOLN
1000.0000 ug | Freq: Once | INTRAMUSCULAR | Status: AC
  Administered 2017-09-11: 1000 ug via INTRAMUSCULAR

## 2017-09-11 MED ORDER — SODIUM CHLORIDE 0.9 % IV SOLN
15.9000 mg/kg | Freq: Once | INTRAVENOUS | Status: AC
  Administered 2017-09-11: 1200 mg via INTRAVENOUS
  Filled 2017-09-11: qty 48

## 2017-09-11 MED ORDER — PROCHLORPERAZINE MALEATE 10 MG PO TABS
10.0000 mg | ORAL_TABLET | Freq: Once | ORAL | Status: AC
  Administered 2017-09-11: 10 mg via ORAL

## 2017-09-11 MED ORDER — SODIUM CHLORIDE 0.9 % IV SOLN
410.0000 mg/m2 | Freq: Once | INTRAVENOUS | Status: AC
  Administered 2017-09-11: 800 mg via INTRAVENOUS
  Filled 2017-09-11: qty 20

## 2017-09-11 MED ORDER — SODIUM CHLORIDE 0.9% FLUSH
10.0000 mL | INTRAVENOUS | Status: DC | PRN
  Administered 2017-09-11: 10 mL
  Filled 2017-09-11: qty 10

## 2017-09-11 MED ORDER — SODIUM CHLORIDE 0.9% FLUSH
10.0000 mL | Freq: Once | INTRAVENOUS | Status: AC
  Administered 2017-09-11: 10 mL
  Filled 2017-09-11: qty 10

## 2017-09-11 MED ORDER — CYANOCOBALAMIN 1000 MCG/ML IJ SOLN
INTRAMUSCULAR | Status: AC
  Filled 2017-09-11: qty 1

## 2017-09-11 MED ORDER — HEPARIN SOD (PORK) LOCK FLUSH 100 UNIT/ML IV SOLN
500.0000 [IU] | Freq: Once | INTRAVENOUS | Status: AC | PRN
  Administered 2017-09-11: 500 [IU]
  Filled 2017-09-11: qty 5

## 2017-09-11 MED ORDER — SODIUM CHLORIDE 0.9 % IV SOLN
Freq: Once | INTRAVENOUS | Status: AC
  Administered 2017-09-11: 14:00:00 via INTRAVENOUS

## 2017-09-11 MED ORDER — PROCHLORPERAZINE MALEATE 10 MG PO TABS
ORAL_TABLET | ORAL | Status: AC
  Filled 2017-09-11: qty 1

## 2017-09-11 NOTE — Patient Instructions (Addendum)
[facility] Discharge Instructions for Patients Receiving Chemotherapy  Today you received the following chemotherapy agents Alimta,Avastin  To help prevent nausea and vomiting after your treatment, we encourage you to take your nausea medication as directed  If you develop nausea and vomiting that is not controlled by your nausea medication, call the clinic.   BELOW ARE SYMPTOMS THAT SHOULD BE REPORTED IMMEDIATELY:  *FEVER GREATER THAN 100.5 F  *CHILLS WITH OR WITHOUT FEVER  NAUSEA AND VOMITING THAT IS NOT CONTROLLED WITH YOUR NAUSEA MEDICATION  *UNUSUAL SHORTNESS OF BREATH  *UNUSUAL BRUISING OR BLEEDING  TENDERNESS IN MOUTH AND THROAT WITH OR WITHOUT PRESENCE OF ULCERS  *URINARY PROBLEMS  *BOWEL PROBLEMS  UNUSUAL RASH Items with * indicate a potential emergency and should be followed up as soon as possible.  Feel free to call the clinic should you have any questions or concerns. The clinic phone number is [phone].  Please show the [facility] at check-in to the Emergency Department and triage nurse.

## 2017-09-11 NOTE — Assessment & Plan Note (Signed)
This is a very pleasant 77 year old white male with unresectable a stage II a non-small cell lung cancer, adenocarcinoma presented with right hilar mass status post a course of concurrent chemoradiation with weekly carboplatin and paclitaxel. He has partial response to this treatment. The patient was then on observation for a few months. The patient developed disease progression and he was started on systemic chemotherapy with carboplatin, Alimta and Avastin status post 6 cycles. The patient tolerated the previous treatment fairly well except for increasing fatigue. The patient is currently undergoing maintenance treatment with Alimta and Avastin status post 4 cycles.  He is tolerating the treatment well.  The patient had a restaging CT scan of the chest, abdomen, pelvis and is here to discuss results.  The patient was seen with Dr. Julien Nordmann.  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  There was no evidence of disease progression elsewhere.  Recommend that he continue on maintenance Alimta and Avastin.  He will proceed with cycle 4 as scheduled today. For the bilateral adrenal metastasis, referral has been placed for consideration of radiation to this area.  For his dyspnea and emphysema noted on his CT scan, referral has been placed for pulmonology.  The patient will follow-up in 3 weeks for evaluation prior to cycle #5.  He was advised to call immediately if he has any concerning symptoms in the interval. The patient voices understanding of current disease status and treatment options and is in agreement with the current care plan. All questions were answered. The patient knows to call the clinic with any problems, questions or concerns. We can certainly see the patient much sooner if necessary.

## 2017-09-11 NOTE — Progress Notes (Signed)
Mark Howell OFFICE PROGRESS NOTE  [name], MD [place] [id number]  DIAGNOSIS: Metastatic non-small cell lung cancer, adenocarcinoma initially diagnosed as Unresectable stage IIA (T1a, N1, M0) non-small cell lung cancer, adenocarcinoma presented with a right hilar mass and right hilar adenopathy diagnosed in September 2017. [facility] 360: Negative for EGFR, ALK, ROS 1 and BRAF mutations.  PRIOR THERAPY: 1) Status post concurrent chemoradiation with weekly carboplatin for AUC of 2 and paclitaxel 45 MG/M2 for 7 weeks. 2) Systemic chemotherapy with carboplatin for AUC of 5, Alimta 500 MG/M2 and Avastin 15 MG/KG every 3 weeks. First dose 01/08/2017. Status post 6 cycles.  Last dose was given April 30, 2002 stable disease.  CURRENT THERAPY: Maintenance treatment with Alimta 500 mg/M2 and Avastin 15 mg/KG every 3 weeks.  First dose June 11, 2017.  Status post 3 cycles.  INTERVAL HISTORY: Mark Howell 77 y.o. male returns for routine follow-up visit accompanied by his wife.  The patient is seen in the infusion room.  The patient reports that he has ongoing fatigue and weakness.  This is really unchanged from prior reports.  He also reports dyspnea on exertion.  He is on Advair and albuterol as needed.  Patient's wife is asking if he should see a pulmonologist.  The patient denies fevers and chills.  Denies chest pain, cough, hemoptysis.  Denies nausea, vomiting, constipation, diarrhea.  The patient continues to tolerate his treatment with maintenance Alimta and Avastin fairly well.  He had a restaging CT scan and is here to discuss the results.  MEDICAL HISTORY: Past Medical History:  Diagnosis Date  . Arthritis   . Complication of anesthesia    problems voiding post op  . Diabetes mellitus without complication ([place])   . Full dentures   . GERD (gastroesophageal reflux disease)   . History of radiation therapy 01/25/16-02/28/16   right lung 45 Gy   . History of radiation therapy 04/17/16-05/02/16   right lung boost 20 Gy in 10 fractions cumulative dose 65 gray  . HOH (hard of hearing)   . HTN (hypertension)   . Hyperlipidemia   . lung ca dx'd 01/2016  . Lung mass 12/30/2015  . Odynophagia 04/06/2016  . Psoriatic arthritis ([place])   . Snores   . Wears glasses     ALLERGIES:  is allergic to hydrocodone and oxycodone.  MEDICATIONS:  Current Outpatient Medications  Medication Sig Dispense Refill  . albuterol (VENTOLIN HFA) 108 (90 Base) MCG/ACT inhaler Inhale 2 puffs into the lungs every 6 (six) hours as needed for wheezing or shortness of breath.     Marland Kitchen aspirin EC 81 MG tablet Take 81 mg by mouth at bedtime.     . blood glucose meter kit and supplies KIT Dispense based on patient and insurance preference. Use up to four times daily as directed. (FOR ICD-9 250.00, 250.01). 1 each 0  . calcium carbonate (TUMS - DOSED IN MG ELEMENTAL CALCIUM) 500 MG chewable tablet Chew 1 tablet by mouth daily.    . Cetirizine HCl 10 MG TBDP Take 1 tablet by mouth daily.     . Coenzyme Q10 (CO Q 10 PO) Take 1 tablet by mouth daily.    . Continuous Blood Gluc Sensor MISC 1 each by Does not apply route as directed. Use as directed every 10 days. May dispense FreeStyle [employer] or similar. 2 each 2  . dexamethasone (DECADRON) 4 MG tablet TAKE 1 TABLET TWICE DAILY FOR 3 DAYS (  DAY BEFORE, DAY OF, AND DAY AFTER CHEMO) 40 tablet 0  . Dextromethorphan-Guaifenesin (DELSYM COUGH/CHEST CONGEST DM PO) Take 30 mLs by mouth daily as needed (cold symptoms).    . diphenhydramine-acetaminophen (TYLENOL PM) 25-500 MG TABS tablet Take 1 tablet by mouth at bedtime as needed (sleep).    . diphenoxylate-atropine (LOMOTIL) 2.5-0.025 MG tablet Take 2 tablets by mouth 4 (four) times daily as needed for diarrhea or loose stools. (Patient not taking: Reported on 08/21/2017) 40 tablet 1  . fluticasone-salmeterol (ADVAIR HFA) 115-21 MCG/ACT inhaler Inhale 2 puffs into the lungs  2 (two) times daily. 1 Inhaler 12  . folic acid (FOLVITE) 1 MG tablet Take 1 tablet (1 mg total) by mouth daily. 30 tablet 2  . folic acid (FOLVITE) 1 MG tablet TAKE 1 TABLET DAILY 30 tablet 0  . glipiZIDE (GLUCOTROL) 10 MG tablet Take 1 tablet (10 mg total) by mouth daily before breakfast. (Patient taking differently: Take 10 mg by mouth daily before breakfast. ) 30 tablet 1  . guaiFENesin (MUCINEX) 600 MG 12 hr tablet Take 1 tablet (600 mg total) by mouth 2 (two) times daily. (Patient not taking: Reported on 07/30/2017) 60 tablet 0  . lidocaine (XYLOCAINE) 2 % solution Use as directed 20 mLs in the mouth or throat every 3 (three) hours as needed for mouth pain. 100 mL 2  . lidocaine-prilocaine (EMLA) cream Apply 1 application topically as needed. 30 g 0  . loperamide (IMODIUM A-D) 2 MG tablet Take 2 mg by mouth daily as needed for diarrhea or loose stools.    Marland Kitchen LORazepam (ATIVAN) 0.5 MG tablet Take 1 tablet (0.5 mg total) by mouth 2 (two) times daily as needed for anxiety. 30 tablet 0  . Lutein 10 MG TABS Take 10 mg by mouth at bedtime.     . magic mouthwash SOLN Take 5 mLs by mouth 4 (four) times daily as needed for mouth pain. (Patient taking differently: Take 5 mLs by mouth 4 (four) times daily as needed for mouth pain. SWISH AND SPIT OUT) 240 mL 2  . Methylcobalamin (METHYL B-12 PO) Take 1 tablet by mouth daily.    . mirtazapine (REMERON) 15 MG tablet Take 15 mg by mouth at bedtime.    . Multiple Vitamin (MULITIVITAMIN WITH MINERALS) TABS Take 1 tablet by mouth daily.    . pantoprazole (PROTONIX) 40 MG tablet Take 40 mg by mouth daily.     . pravastatin (PRAVACHOL) 40 MG tablet Take 40 mg by mouth at bedtime.     . predniSONE (DELTASONE) 5 MG tablet Take 5 mg by mouth daily with breakfast.    . prochlorperazine (COMPAZINE) 10 MG tablet Take 1 tablet (10 mg total) by mouth every 6 (six) hours as needed for nausea or vomiting. (Patient not taking: Reported on 07/30/2017) 30 tablet 0  . tamsulosin  (FLOMAX) 0.4 MG CAPS capsule TAKE (1) CAPSULE DAILY, START 4 DAYS BEFORE PROCEDURE (Patient taking differently: take 1 capsule by mouth every morning) 30 capsule 2  . triamcinolone (NASACORT) 55 MCG/ACT AERO nasal inhaler Place 1 spray into the nose at bedtime.      No current facility-administered medications for this visit.    Facility-Administered Medications Ordered in Other Visits  Medication Dose Route Frequency Provider Last Rate Last Dose  . sodium chloride flush (NS) 0.9 % injection 10 mL  10 mL Intracatheter PRN Curt Bears, MD   10 mL at 09/11/17 1505    SURGICAL HISTORY:  Past Surgical History:  Procedure Laterality Date  . COLONOSCOPY    . HEMORRHOID SURGERY N/A 10/01/2013   Procedure: EXAM UNDER ANESTHESIA  AND EXCISIONAL HEMORRHOIDECTOMY WITH HEMORRHOID BANDING X 2;  Surgeon: Gayland Curry, MD;  Location: [facility];  Service: General;  Laterality: N/A;  . INGUINAL HERNIA REPAIR  01/04/2012   Procedure: LAPAROSCOPIC INGUINAL HERNIA;  Surgeon: Gayland Curry, MD,FACS;  Location: [facility];  Service: General;  Laterality: Right;  . IR FLUORO GUIDE PORT INSERTION RIGHT  01/02/2017  . IR US GUIDE VASC ACCESS RIGHT  01/02/2017  . VIDEO BRONCHOSCOPY WITH ENDOBRONCHIAL ULTRASOUND N/A 12/31/2015   Procedure: VIDEO BRONCHOSCOPY WITH ENDOBRONCHIAL ULTRASOUND transbronchial biopsy of node 10 R lymph node;  Surgeon: Grace Isaac, MD;  Location: [facility];  Service: Thoracic;  Laterality: N/A;    REVIEW OF SYSTEMS:   Review of Systems  Constitutional: Negative for appetite change, chills, fever and unexpected weight change. Positive for fatigue and generalized weakness. HENT:   Negative for mouth sores, nosebleeds, sore throat and trouble swallowing.   Eyes: Negative for eye problems and icterus.  Respiratory: Negative for cough, hemoptysis, shortness of breath and wheezing.   Cardiovascular: Negative for chest pain and leg swelling.  Gastrointestinal: Negative for abdominal  pain, constipation, diarrhea, nausea and vomiting.  Genitourinary: Negative for bladder incontinence, difficulty urinating, dysuria, frequency and hematuria.   Musculoskeletal: Negative for back pain, neck pain and neck stiffness.  Skin: Negative for itching and rash.  Neurological: Negative for dizziness, headaches, light-headedness and seizures.  Hematological: Negative for adenopathy. Does not bruise/bleed easily.  Psychiatric/Behavioral: Negative for confusion, depression and sleep disturbance. The patient is not nervous/anxious.     PHYSICAL EXAMINATION:  Vital signs: Temperature 97.9, pulse 98, blood pressure 124/90, respirations 18, O2 sat 100% on room air.  ECOG PERFORMANCE STATUS: 1 - Symptomatic but completely ambulatory  Physical Exam  Constitutional: Oriented to person, place, and time and well-developed, well-nourished, and in no distress. No distress.  HENT:  Head: Normocephalic and atraumatic.  Mouth/Throat: Oropharynx is clear and moist. No oropharyngeal exudate.  Eyes: Conjunctivae are normal. Right eye exhibits no discharge. Left eye exhibits no discharge. No scleral icterus.  Neck: Normal range of motion. Neck supple.  Cardiovascular: Normal rate, regular rhythm, normal heart sounds and intact distal pulses.   Pulmonary/Chest: Effort normal.  Expiratory wheezes noted in the posterior lung fields.  No respiratory distress. No rales.  Abdominal: Soft. Bowel sounds are normal. Exhibits no distension and no mass. There is no tenderness.  Musculoskeletal: Normal range of motion. Exhibits no edema.  Lymphadenopathy:    No cervical adenopathy.  Neurological: Alert and oriented to person, place, and time. Exhibits normal muscle tone. Coordination normal.  Skin: Skin is warm and dry. No rash noted. Not diaphoretic. No erythema. No pallor.  Psychiatric: Mood, memory and judgment normal.  Vitals reviewed.  LABORATORY DATA: Lab Results  Component Value Date   WBC 9.8  09/11/2017   HGB 10.2 (L) 09/11/2017   HCT 31.5 (L) 09/11/2017   MCV 95.5 09/11/2017   PLT 153 09/11/2017      Chemistry      Component Value Date/Time   NA 135 (L) 09/11/2017 1025   NA 135 (L) 04/30/2017 0824   K 5.0 09/11/2017 1025   K 4.4 04/30/2017 0824   CL 102 09/11/2017 1025   CO2 20 (L) 09/11/2017 1025   CO2 19 (L) 04/30/2017 0824   BUN 40 (H) 09/11/2017 1025   BUN 28.0 (H) 04/30/2017  0388   CREATININE 1.40 (H) 09/11/2017 1025   CREATININE 1.5 (H) 04/30/2017 0824      Component Value Date/Time   CALCIUM 8.5 09/11/2017 1025   CALCIUM 7.6 (L) 04/30/2017 0824   ALKPHOS 85 09/11/2017 1025   ALKPHOS 88 04/30/2017 0824   AST 26 09/11/2017 1025   AST 22 04/30/2017 0824   ALT 19 09/11/2017 1025   ALT 19 04/30/2017 0824   BILITOT 0.3 09/11/2017 1025   BILITOT 0.57 04/30/2017 0824       RADIOGRAPHIC STUDIES:  Ct Chest W Contrast  Result Date: 09/08/2017 CLINICAL DATA:  Lung cancer restaging. EXAM: CT CHEST, ABDOMEN, AND PELVIS WITH CONTRAST TECHNIQUE: Multidetector CT imaging of the chest, abdomen and pelvis was performed following the standard protocol during bolus administration of intravenous contrast. CONTRAST:  166m OMNIPAQUE IOHEXOL 300 MG/ML  SOLN COMPARISON:  CT AP 05/17/2017, CT a chest 05/09/2017 and CT chest abdomen and pelvis from 03/08/2017. FINDINGS: CT CHEST FINDINGS Cardiovascular: The heart size appears normal. Aortic atherosclerosis noted. No pericardial effusion. Calcification in the LAD and RCA and left circumflex coronary arteries noted. Mediastinum/Nodes: Normal appearance of the thyroid gland. The trachea appears patent and is midline. Normal appearance of the esophagus. No enlarged mediastinal or hilar lymph nodes. Lungs/Pleura: No pleural effusion. Moderate changes of emphysema. Paramediastinal masslike architectural distortion and fibrosis within the right upper lobe, right middle lobe and right lower lobe identified compatible with changes due to  external beam radiation. Right middle lobe subpleural nodule measures 7 mm, image 88/6. Unchanged from 05/09/2017. Pulmonary nodule within the right lower lobe measures 4 mm and is unchanged from 03/08/17. Posterior left lower lobe lung nodule measures 8 mm, image 105/6. Unchanged from previous exam. Musculoskeletal: No suspicious bone lesions. CT ABDOMEN PELVIS FINDINGS Hepatobiliary: No focal liver abnormality. Normal appearance of the gallbladder. No biliary dilatation. Pancreas: Unremarkable. No pancreatic ductal dilatation or surrounding inflammatory changes. Spleen: Normal in size without focal abnormality. 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 The index lesion within the right adrenal gland measures 2.9 by 2.1 cm, image 62/2. Previously 2.0 x 1.9 cm. The left adrenal gland metastasis measures 3.8 by 2.9 cm, image 66/2. Previously 3.5 by 2.8 cm. Bilateral renal cortical scarring identified. Multiple right renal calculi measure up to 5 mm. Punctate stone within the upper pole of the scratch set small left renal calculi are also stable. Simple appearing cyst arises from the inferior pole of left kidney measuring 1.2 cm. No hydronephrosis identified bilaterally. Urinary bladder appears normal. Stomach/Bowel: Stomach normal. The small bowel loops have a normal course and caliber without obstruction. No pathologic dilatation of the colon. Distal colonic diverticula noted without acute inflammation. Vascular/Lymphatic: Aortic atherosclerosis. No aneurysm. No abdominal or pelvic adenopathy. No inguinal adenopathy. Reproductive: The prostate gland measures 3.6 by 5.5 by 4.5 cm (volume = 47 cm^3). Other: There is no ascites or focal fluid collection. Musculoskeletal: Degenerative disc disease within the lumbar spine. No suspicious bone lesions. IMPRESSION: 1. 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 2. Stable changes of external beam radiation within the  right lung. Small peripheral nodules within the right middle lobe, right lower lobe and left lower lobe are unchanged in the interval. 3. No new sites of disease. 4. Aortic Atherosclerosis (ICD10-I70.0) and Emphysema (ICD10-J43.9). 5. Three vessel coronary artery atherosclerotic calcifications 6. Bilateral nonobstructing renal calculi 7. Mild prostate gland enlargement. Electronically Signed   By: TKerby MoorsM.D.   On: 09/08/2017 18:15  Ct Abdomen Pelvis W Contrast  Result Date: 09/08/2017 CLINICAL DATA:  Lung cancer restaging. EXAM: CT CHEST, ABDOMEN, AND PELVIS WITH CONTRAST TECHNIQUE: Multidetector CT imaging of the chest, abdomen and pelvis was performed following the standard protocol during bolus administration of intravenous contrast. CONTRAST:  137m OMNIPAQUE IOHEXOL 300 MG/ML  SOLN COMPARISON:  CT AP 05/17/2017, CT a chest 05/09/2017 and CT chest abdomen and pelvis from 03/08/2017. FINDINGS: CT CHEST FINDINGS Cardiovascular: The heart size appears normal. Aortic atherosclerosis noted. No pericardial effusion. Calcification in the LAD and RCA and left circumflex coronary arteries noted. Mediastinum/Nodes: Normal appearance of the thyroid gland. The trachea appears patent and is midline. Normal appearance of the esophagus. No enlarged mediastinal or hilar lymph nodes. Lungs/Pleura: No pleural effusion. Moderate changes of emphysema. Paramediastinal masslike architectural distortion and fibrosis within the right upper lobe, right middle lobe and right lower lobe identified compatible with changes due to external beam radiation. Right middle lobe subpleural nodule measures 7 mm, image 88/6. Unchanged from 05/09/2017. Pulmonary nodule within the right lower lobe measures 4 mm and is unchanged from 03/08/17. Posterior left lower lobe lung nodule measures 8 mm, image 105/6. Unchanged from previous exam. Musculoskeletal: No suspicious bone lesions. CT ABDOMEN PELVIS FINDINGS Hepatobiliary: No focal liver  abnormality. Normal appearance of the gallbladder. No biliary dilatation. Pancreas: Unremarkable. No pancreatic ductal dilatation or surrounding inflammatory changes. Spleen: Normal in size without focal abnormality. 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 The index lesion within the right adrenal gland measures 2.9 by 2.1 cm, image 62/2. Previously 2.0 x 1.9 cm. The left adrenal gland metastasis measures 3.8 by 2.9 cm, image 66/2. Previously 3.5 by 2.8 cm. Bilateral renal cortical scarring identified. Multiple right renal calculi measure up to 5 mm. Punctate stone within the upper pole of the scratch set small left renal calculi are also stable. Simple appearing cyst arises from the inferior pole of left kidney measuring 1.2 cm. No hydronephrosis identified bilaterally. Urinary bladder appears normal. Stomach/Bowel: Stomach normal. The small bowel loops have a normal course and caliber without obstruction. No pathologic dilatation of the colon. Distal colonic diverticula noted without acute inflammation. Vascular/Lymphatic: Aortic atherosclerosis. No aneurysm. No abdominal or pelvic adenopathy. No inguinal adenopathy. Reproductive: The prostate gland measures 3.6 by 5.5 by 4.5 cm (volume = 47 cm^3). Other: There is no ascites or focal fluid collection. Musculoskeletal: Degenerative disc disease within the lumbar spine. No suspicious bone lesions. IMPRESSION: 1. 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 2. Stable changes of external beam radiation within the right lung. Small peripheral nodules within the right middle lobe, right lower lobe and left lower lobe are unchanged in the interval. 3. No new sites of disease. 4. Aortic Atherosclerosis (ICD10-I70.0) and Emphysema (ICD10-J43.9). 5. Three vessel coronary artery atherosclerotic calcifications 6. Bilateral nonobstructing renal calculi 7. Mild prostate gland enlargement. Electronically Signed   By:  TKerby MoorsM.D.   On: 09/08/2017 18:15     ASSESSMENT/PLAN:  Adenocarcinoma of right lung, stage 4 (HCC) This is a very pleasant 77year old white male with unresectable a stage II a non-small cell lung cancer, adenocarcinoma presented with right hilar mass status post a course of concurrent chemoradiation with weekly carboplatin and paclitaxel. He has partial response to this treatment. The patient was then on observation for a few months. The patient developed disease progression and he was started on systemic chemotherapy with carboplatin, Alimta and Avastin status post 6 cycles. The patient tolerated the previous  treatment fairly well except for increasing fatigue. The patient is currently undergoing maintenance treatment with Alimta and Avastin status post 4 cycles.  He is tolerating the treatment well.  The patient had a restaging CT scan of the chest, abdomen, pelvis and is here to discuss results.  The patient was seen with Dr. Julien Nordmann.  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  There was no evidence of disease progression elsewhere.  Recommend that he continue on maintenance Alimta and Avastin.  He will proceed with cycle 4 as scheduled today. For the bilateral adrenal metastasis, referral has been placed for consideration of radiation to this area.  For his dyspnea and emphysema noted on his CT scan, referral has been placed for pulmonology.  The patient will follow-up in 3 weeks for evaluation prior to cycle #5.  He was advised to call immediately if he has any concerning symptoms in the interval. The patient voices understanding of current disease status and treatment options and is in agreement with the current care plan. All questions were answered. The patient knows to call the clinic with any problems, questions or concerns. We can certainly see the patient much sooner if  necessary.   Orders Placed This Encounter  Procedures  . Total Protein, Urine dipstick  . Ambulatory referral to Radiation Oncology    Referral Priority:   Routine    Referral Type:   Consultation    Referral Reason:   Specialty Services Required    Referred to Provider:   Gery Pray, MD    Requested Specialty:   Radiation Oncology    Number of Visits Requested:   1  . Ambulatory referral to Pulmonology    Referral Priority:   Routine    Referral Type:   Consultation    Referral Reason:   Specialty Services Required    Referred to Provider:   Collene Gobble, MD    Requested Specialty:   Pulmonary Disease    Number of Visits Requested:   1   Mikey Bussing, DNP, AGPCNP-BC, AOCNP 09/11/17  ADDENDUM: Hematology/Oncology Attending: I had a face-to-face encounter with the patient.  I recommended his care plan.  This is a very pleasant 77 years old white male with metastatic non-small cell lung cancer, adenocarcinoma status post several chemotherapy regimens and he is currently undergoing treatment with maintenance Alimta and Avastin status post 4 cycles.  He has been tolerating this treatment well except for fatigue as well as shortness of breath and cough which is likely secondary to COPD. He had recent CT scan of the chest, abdomen and pelvis.  I personally and independently reviewed the scans and discussed the results with the patient and his wife.  His scan showed no concerning findings for disease progression in the chest but there was increase in the bilateral adrenal glands. I recommended for the patient to see Dr. Sondra Come for consideration of palliative radiotherapy to the adrenal glands. He will continue his current maintenance treatment with Alimta and Avastin. He will come back for follow-up visit in 3 weeks for evaluation before the next cycle of his treatment.   For COPD, I recommended for the patient referral to pulmonary medicine for evaluation and management of his  condition. He was advised to call immediately if he has any concerning symptoms in the interval.  Disclaimer: This note was dictated with voice recognition software. Similar sounding words can inadvertently be transcribed and  may be missed upon review. Eilleen Kempf, MD 09/12/17

## 2017-09-12 ENCOUNTER — Telehealth: Payer: Self-pay | Admitting: Internal Medicine

## 2017-09-12 ENCOUNTER — Encounter: Payer: Self-pay | Admitting: Radiation Oncology

## 2017-09-12 NOTE — Telephone Encounter (Signed)
3 cycles already scheduled per 5/14 los.

## 2017-09-13 NOTE — Progress Notes (Signed)
Thoracic Location of Tumor / Histology: Adenocarcinoma of the right lung, stage IV  Biopsies revealed:  12/31/2015 FINE NEEDLE ASPIRATION, EBUS 10R #1 (SPECIMEN 1 OF 3, COLLECTED 12/31/15): MALIGNANT CELLS CONSISTENT WITH NON-SMALL CELL CARCINOMA. FINE NEEDLE ASPIRATION, EBUS 10R #2 (SPECIMEN 2 OF 3, COLLECTED 12/31/15): MALIGNANT CELLS CONSISTENT WITH NON-SMALL CELL CARCINOMA. FINE NEEDLE ASPIRATION,ENDOSCOPIC (C) EBUS 10R # 3 (SPECIMEN 3 OF 3,COLLECTED ON 12/31/15) MALIGNANT CELLS CONSISTENT WITH NON-SMALL CELL CARCINOMA.  Tobacco/Marijuana/Snuff/ETOH use: Used to smoke ~0.5 pack a day for 50 years (quit in August 2017). Occasionally drinks alcohol, but has never used snuff or marijuana.   Past/Anticipated interventions by cardiothoracic surgery, if any: None  Past/Anticipated interventions by medical oncology, if any:  Currently: Maintenance treatment with Alimta 500 mg/M2 and Avastin 15 mg/KG every 3 weeks. First dose June 11, 2017.Status post 3 cycles  1) Status post concurrent chemoradiation with weekly carboplatin for AUC of 2 and paclitaxel 45 MG/M2 for 7 weeks. 2) Systemic chemotherapy with carboplatin for AUC of 5, Alimta 500 MG/M2 and Avastin 15 MG/KG every 3 weeks. First dose 01/08/2017. Status post 6 cycles. Last dose was given April 30, 2002 stable disease  Signs/Symptoms  Weight changes, if any: Patient has difficulty eating and drinking do to dryness/mouth sores, so he states his weight fluctuates  Respiratory complaints, if any: Dyspnea on exertion.  He is on Advair and albuterol as needed.   Hemoptysis, if any: No  Pain issues, if any:  No pain with cough, but occasional intercoastal pain on the right/lateral/posterior side  SAFETY ISSUES:  Prior radiation? 4 months, 04/17/16-05/02/16 20 Gy in 10 fractions (cumulative dose 65 Gy) Right lung boost  Pacemaker/ICD? No  Possible current pregnancy? No  Is the patient on methotrexate? Yes; Stopped ~3-4 years  ago d/t liver damage (Per Dr. Truslow/Dr. Tad Moore, PA-C with [facility])  Current Complaints / other details:   Mark Howell is here today with his wife to see Dr. Sondra Come for consideration of palliative radiotherapy to the adrenal glands. He reports extreme shortness of breath with minimal to moderate exertion. He has not been able to perform his usual activities because of the dyspnea and is very frustrated with his current situation. He reports feeling weak and tired most of the time because he cannot catch his breath. One of his biggest concerns/frustrations is the dry mouth/mouth soreness that he attributes to his chemotherapy treatments.  For COPD, he has been referred by Dr. Julien Nordmann to pulmonary medicine for evaluation and management of his condition

## 2017-09-17 ENCOUNTER — Other Ambulatory Visit: Payer: Self-pay

## 2017-09-17 ENCOUNTER — Ambulatory Visit
Admission: RE | Admit: 2017-09-17 | Discharge: 2017-09-17 | Disposition: A | Discharge status: Home / Self Care | Payer: Medicare Other | Source: Ambulatory Visit | Attending: Radiation Oncology | Admitting: Radiation Oncology

## 2017-09-17 ENCOUNTER — Encounter: Payer: Self-pay | Admitting: Radiation Oncology

## 2017-09-17 ENCOUNTER — Other Ambulatory Visit: Payer: Medicare Other

## 2017-09-17 VITALS — BP 131/91 | HR 113 | Temp 97.7°F | Resp 20 | Ht 73.5 in | Wt 168.8 lb

## 2017-09-17 DIAGNOSIS — C7970 Secondary malignant neoplasm of unspecified adrenal gland: Secondary | ICD-10-CM

## 2017-09-17 DIAGNOSIS — C3491 Malignant neoplasm of unspecified part of right bronchus or lung: Secondary | ICD-10-CM

## 2017-09-17 NOTE — Progress Notes (Signed)
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 male who is presenting to the office today for evaluation of recurrent non-small cell carcinoma. He is accompanied by his wife today. He is here today to discuss palliative radiation to his adrenal glands.   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 Stable changes of external beam radiation within the right lung. Small peripheral nodules within the right middle lobe, right lower lobe and left lower lobe are unchanged in the interval. No new sites of disease. Aortic Atherosclerosis (ICD10-I70.0) and Emphysema (ICD10-J43.9). Three vessel coronary artery atherosclerotic calcifications Bilateral nonobstructing renal calculi. Mild prostate gland enlargement.  He has underwent bronchoscopy with endobronchial [place] and fine needle aspiration on 12/31/2015 with results showing: fine needle aspiration, EBUS 10R #1 (specimen 1 of 3, collected 12/31/15). Malignant cells consistent with non-small cell carcinoma. Fine needle aspiration, EBUS 10R #2 (specimen 2 of 3, collected 12/31/15). Malignant cells consistent with non-small cell carcinoma. Fine needle aspiration, endoscopic (c) EBUS 10R #3 (specimen 3 of 3, collected on 12/31/15). Malignant cells consistent with non-small cell carcinoma.   He previously had prior radiation for 4 months, 04/17/16-05/02/16 20 Gy in 10 fractions(cumulative dose 65 Gy)Right lung boost. He is also status post concurrent chemoradiation with weekly carboplatin for AUC of 2 and  paclitaxel 45 MG/M2 for 7 weeks. He is also s/p systemic chemotherapy with carboplatin for AUC of 5, Alimta 500 MG/M2 and Avastin 15 MG/KG every 3 weeks. First dose 01/08/2017. Status post 6 cycles. Last dose was given April 30, 2002 stable disease  On review of systems, he reports right flank pain, DOE, SOB, sinus frontal HA x 1 month. He doesn't have any oxygen at home, however, he notes that his SpO2 is always relatively high at 98-99%. He recently had his oxygen levels checked while on the monitor and walking and his O2 saturation levels didn't decrease much. he denies CP, hemoptysis, and any other symptoms.    PREVIOUS RADIATION THERAPY: Yes; 4 months, 04/17/16-05/02/16 20 Gy in 10 fractions(cumulative dose 65 Gy)Right lung boost.   PAST MEDICAL HISTORY:  has a past medical history of Arthritis, Complication of anesthesia, Diabetes mellitus without complication ([place]), Full dentures, GERD (gastroesophageal reflux disease), History of radiation therapy (01/25/16-02/28/16), History of radiation therapy (04/17/16-05/02/16), HOH (hard of hearing), Hyperlipidemia, lung ca (dx'd 01/2016), Lung mass (12/30/2015), Odynophagia (04/06/2016), Psoriatic arthritis ([place]), Snores, and Wears glasses.    PAST SURGICAL HISTORY: Past Surgical History:  Procedure Laterality Date  . COLONOSCOPY    . HEMORRHOID SURGERY N/A 10/01/2013   Procedure: EXAM UNDER ANESTHESIA  AND EXCISIONAL HEMORRHOIDECTOMY WITH HEMORRHOID BANDING X 2;  Surgeon: Gayland Curry, MD;  Location: [place];  Service: General;  Laterality: N/A;  . INGUINAL HERNIA REPAIR  01/04/2012   Procedure: LAPAROSCOPIC INGUINAL HERNIA;  Surgeon: Gayland Curry, MD,FACS;  Location: [facility];  Service: General;  Laterality: Right;  . IR FLUORO GUIDE PORT INSERTION RIGHT  01/02/2017  . IR US GUIDE VASC ACCESS RIGHT  01/02/2017  . VIDEO BRONCHOSCOPY WITH ENDOBRONCHIAL ULTRASOUND N/A  12/31/2015   Procedure: VIDEO BRONCHOSCOPY WITH ENDOBRONCHIAL ULTRASOUND  transbronchial biopsy of node 10 R lymph node;  Surgeon: Grace Isaac, MD;  Location: [facility];  Service: Thoracic;  Laterality: N/A;    FAMILY HISTORY: family history includes Cancer in his brother; Diabetes in his father; Heart disease in his father; Stroke in his father.  SOCIAL HISTORY:  reports that he quit smoking about 21 months ago. His smoking use included cigarettes. He has a 25.00 pack-year smoking history. He has never used smokeless tobacco. He reports that he drank alcohol. He reports that he does not use drugs.  ALLERGIES: Hydrocodone and Oxycodone  MEDICATIONS:  Current Outpatient Medications  Medication Sig Dispense Refill  . albuterol (VENTOLIN HFA) 108 (90 Base) MCG/ACT inhaler Inhale 2 puffs into the lungs every 6 (six) hours as needed for wheezing or shortness of breath.     Marland Kitchen aspirin EC 81 MG tablet Take 81 mg by mouth at bedtime.     . blood glucose meter kit and supplies KIT Dispense based on patient and insurance preference. Use up to four times daily as directed. (FOR ICD-9 250.00, 250.01). 1 each 0  . calcium carbonate (TUMS - DOSED IN MG ELEMENTAL CALCIUM) 500 MG chewable tablet Chew 1 tablet by mouth daily.    . Cetirizine HCl 10 MG TBDP Take 1 tablet by mouth daily.     . Coenzyme Q10 (CO Q 10 PO) Take 1 tablet by mouth daily.    . Continuous Blood Gluc Sensor MISC 1 each by Does not apply route as directed. Use as directed every 10 days. May dispense FreeStyle [employer] or similar. 2 each 2  . dexamethasone (DECADRON) 4 MG tablet TAKE 1 TABLET TWICE DAILY FOR 3 DAYS (DAY BEFORE, DAY OF, AND DAY AFTER CHEMO) 40 tablet 0  . diphenoxylate-atropine (LOMOTIL) 2.5-0.025 MG tablet Take 2 tablets by mouth 4 (four) times daily as needed for diarrhea or loose stools. 40 tablet 1  . fluticasone-salmeterol (ADVAIR HFA) 115-21 MCG/ACT inhaler Inhale 2 puffs into the lungs 2 (two) times daily. 1 Inhaler 12  . folic acid (FOLVITE) 1 MG tablet Take 1 tablet (1 mg  total) by mouth daily. 30 tablet 2  . glipiZIDE (GLUCOTROL) 10 MG tablet Take 1 tablet (10 mg total) by mouth daily before breakfast. (Patient taking differently: Take 10 mg by mouth daily before breakfast. ) 30 tablet 1  . lidocaine (XYLOCAINE) 2 % solution Use as directed 20 mLs in the mouth or throat every 3 (three) hours as needed for mouth pain. 100 mL 2  . lidocaine-prilocaine (EMLA) cream Apply 1 application topically as needed. 30 g 0  . loperamide (IMODIUM A-D) 2 MG tablet Take 2 mg by mouth daily as needed for diarrhea or loose stools.    Marland Kitchen LORazepam (ATIVAN) 0.5 MG tablet Take 1 tablet (0.5 mg total) by mouth 2 (two) times daily as needed for anxiety. 30 tablet 0  . Lutein 10 MG TABS Take 10 mg by mouth at bedtime.     . magic mouthwash SOLN Take 5 mLs by mouth 4 (four) times daily as needed for mouth pain. (Patient taking differently: Take 5 mLs by mouth 4 (four) times daily as needed for mouth pain. SWISH AND SPIT OUT) 240 mL 2  . Melatonin 5 MG TABS Take 5 mg by mouth at bedtime.    . Methylcobalamin (METHYL B-12 PO) Take 1 tablet by mouth daily.    . mirtazapine (REMERON) 15 MG  tablet Take 15 mg by mouth at bedtime.    . Multiple Vitamin (MULITIVITAMIN WITH MINERALS) TABS Take 1 tablet by mouth daily.    . pantoprazole (PROTONIX) 40 MG tablet Take 40 mg by mouth daily.     . pravastatin (PRAVACHOL) 40 MG tablet Take 40 mg by mouth at bedtime.     . predniSONE (DELTASONE) 5 MG tablet Take 5 mg by mouth daily with breakfast.    . prochlorperazine (COMPAZINE) 10 MG tablet Take 1 tablet (10 mg total) by mouth every 6 (six) hours as needed for nausea or vomiting. 30 tablet 0  . tamsulosin (FLOMAX) 0.4 MG CAPS capsule TAKE (1) CAPSULE DAILY, START 4 DAYS BEFORE PROCEDURE (Patient taking differently: take 1 capsule by mouth every morning) 30 capsule 2  . triamcinolone (NASACORT) 55 MCG/ACT AERO nasal inhaler Place 1 spray into the nose at bedtime.      No current facility-administered  medications for this encounter.     REVIEW OF SYSTEMS:  A 10+ POINT REVIEW OF SYSTEMS WAS OBTAINED including neurology, dermatology, psychiatry, cardiac, respiratory, lymph, extremities, GI, GU, musculoskeletal, constitutional, reproductive, HEENT. All pertinent positives are noted in the HPI. All others are negative.    PHYSICAL EXAM:  height is 6' 1.5" (1.867 m) and weight is 168 lb 12.8 oz (76.6 kg). His oral temperature is 97.7 F (36.5 C). His blood pressure is 131/91 (abnormal) and his pulse is 113 (abnormal). His respiration is 20 and oxygen saturation is 100%.   Lungs with inspiratory and expiratory wheezing along the right side. Marland Kitchen Heart has regular rate and rhythm. No palpable cervical, supraclavicular, or axillary adenopathy. Abdomen soft, non-tender, normal bowel sounds. Neurologic: Exam non-focal    ECOG = 2    LABORATORY DATA:  Lab Results  Component Value Date   WBC 9.8 09/11/2017   HGB 10.2 (L) 09/11/2017   HCT 31.5 (L) 09/11/2017   MCV 95.5 09/11/2017   PLT 153 09/11/2017   NEUTROABS 8.9 (H) 09/11/2017   Lab Results  Component Value Date   NA 135 (L) 09/11/2017   K 5.0 09/11/2017   CL 102 09/11/2017   CO2 20 (L) 09/11/2017   GLUCOSE 286 (H) 09/11/2017   CREATININE 1.40 (H) 09/11/2017   CALCIUM 8.5 09/11/2017      RADIOGRAPHY: Ct Chest W Contrast  Result Date: 09/08/2017 CLINICAL DATA:  Lung cancer restaging. EXAM: CT CHEST, ABDOMEN, AND PELVIS WITH CONTRAST TECHNIQUE: Multidetector CT imaging of the chest, abdomen and pelvis was performed following the standard protocol during bolus administration of intravenous contrast. CONTRAST:  155m OMNIPAQUE IOHEXOL 300 MG/ML  SOLN COMPARISON:  CT AP 05/17/2017, CT a chest 05/09/2017 and CT chest abdomen and pelvis from 03/08/2017. FINDINGS: CT CHEST FINDINGS Cardiovascular: The heart size appears normal. Aortic atherosclerosis noted. No pericardial effusion. Calcification in the LAD and RCA and left circumflex coronary  arteries noted. Mediastinum/Nodes: Normal appearance of the thyroid gland. The trachea appears patent and is midline. Normal appearance of the esophagus. No enlarged mediastinal or hilar lymph nodes. Lungs/Pleura: No pleural effusion. Moderate changes of emphysema. Paramediastinal masslike architectural distortion and fibrosis within the right upper lobe, right middle lobe and right lower lobe identified compatible with changes due to external beam radiation. Right middle lobe subpleural nodule measures 7 mm, image 88/6. Unchanged from 05/09/2017. Pulmonary nodule within the right lower lobe measures 4 mm and is unchanged from 03/08/17. Posterior left lower lobe lung nodule measures 8 mm, image 105/6. Unchanged from previous exam. Musculoskeletal: No  suspicious bone lesions. CT ABDOMEN PELVIS FINDINGS Hepatobiliary: No focal liver abnormality. Normal appearance of the gallbladder. No biliary dilatation. Pancreas: Unremarkable. No pancreatic ductal dilatation or surrounding inflammatory changes. Spleen: Normal in size without focal abnormality. 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 The index lesion within the right adrenal gland measures 2.9 by 2.1 cm, image 62/2. Previously 2.0 x 1.9 cm. The left adrenal gland metastasis measures 3.8 by 2.9 cm, image 66/2. Previously 3.5 by 2.8 cm. Bilateral renal cortical scarring identified. Multiple right renal calculi measure up to 5 mm. Punctate stone within the upper pole of the scratch set small left renal calculi are also stable. Simple appearing cyst arises from the inferior pole of left kidney measuring 1.2 cm. No hydronephrosis identified bilaterally. Urinary bladder appears normal. Stomach/Bowel: Stomach normal. The small bowel loops have a normal course and caliber without obstruction. No pathologic dilatation of the colon. Distal colonic diverticula noted without acute inflammation. Vascular/Lymphatic: Aortic atherosclerosis.  No aneurysm. No abdominal or pelvic adenopathy. No inguinal adenopathy. Reproductive: The prostate gland measures 3.6 by 5.5 by 4.5 cm (volume = 47 cm^3). Other: There is no ascites or focal fluid collection. Musculoskeletal: Degenerative disc disease within the lumbar spine. No suspicious bone lesions. IMPRESSION: 1. 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 2. Stable changes of external beam radiation within the right lung. Small peripheral nodules within the right middle lobe, right lower lobe and left lower lobe are unchanged in the interval. 3. No new sites of disease. 4. Aortic Atherosclerosis (ICD10-I70.0) and Emphysema (ICD10-J43.9). 5. Three vessel coronary artery atherosclerotic calcifications 6. Bilateral nonobstructing renal calculi 7. Mild prostate gland enlargement. Electronically Signed   By: Kerby Moors M.D.   On: 09/08/2017 18:15   Ct Abdomen Pelvis W Contrast  Result Date: 09/08/2017 CLINICAL DATA:  Lung cancer restaging. EXAM: CT CHEST, ABDOMEN, AND PELVIS WITH CONTRAST TECHNIQUE: Multidetector CT imaging of the chest, abdomen and pelvis was performed following the standard protocol during bolus administration of intravenous contrast. CONTRAST:  158m OMNIPAQUE IOHEXOL 300 MG/ML  SOLN COMPARISON:  CT AP 05/17/2017, CT a chest 05/09/2017 and CT chest abdomen and pelvis from 03/08/2017. FINDINGS: CT CHEST FINDINGS Cardiovascular: The heart size appears normal. Aortic atherosclerosis noted. No pericardial effusion. Calcification in the LAD and RCA and left circumflex coronary arteries noted. Mediastinum/Nodes: Normal appearance of the thyroid gland. The trachea appears patent and is midline. Normal appearance of the esophagus. No enlarged mediastinal or hilar lymph nodes. Lungs/Pleura: No pleural effusion. Moderate changes of emphysema. Paramediastinal masslike architectural distortion and fibrosis within the right upper lobe, right middle lobe and right lower lobe  identified compatible with changes due to external beam radiation. Right middle lobe subpleural nodule measures 7 mm, image 88/6. Unchanged from 05/09/2017. Pulmonary nodule within the right lower lobe measures 4 mm and is unchanged from 03/08/17. Posterior left lower lobe lung nodule measures 8 mm, image 105/6. Unchanged from previous exam. Musculoskeletal: No suspicious bone lesions. CT ABDOMEN PELVIS FINDINGS Hepatobiliary: No focal liver abnormality. Normal appearance of the gallbladder. No biliary dilatation. Pancreas: Unremarkable. No pancreatic ductal dilatation or surrounding inflammatory changes. Spleen: Normal in size without focal abnormality. 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 The index lesion within the right adrenal gland measures 2.9 by 2.1 cm, image 62/2. Previously 2.0 x 1.9 cm. The left adrenal gland metastasis measures 3.8 by 2.9 cm, image 66/2. Previously 3.5 by 2.8 cm. Bilateral renal cortical scarring identified. Multiple right renal calculi  measure up to 5 mm. Punctate stone within the upper pole of the scratch set small left renal calculi are also stable. Simple appearing cyst arises from the inferior pole of left kidney measuring 1.2 cm. No hydronephrosis identified bilaterally. Urinary bladder appears normal. Stomach/Bowel: Stomach normal. The small bowel loops have a normal course and caliber without obstruction. No pathologic dilatation of the colon. Distal colonic diverticula noted without acute inflammation. Vascular/Lymphatic: Aortic atherosclerosis. No aneurysm. No abdominal or pelvic adenopathy. No inguinal adenopathy. Reproductive: The prostate gland measures 3.6 by 5.5 by 4.5 cm (volume = 47 cm^3). Other: There is no ascites or focal fluid collection. Musculoskeletal: Degenerative disc disease within the lumbar spine. No suspicious bone lesions. IMPRESSION: 1. 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 2. Stable  changes of external beam radiation within the right lung. Small peripheral nodules within the right middle lobe, right lower lobe and left lower lobe are unchanged in the interval. 3. No new sites of disease. 4. Aortic Atherosclerosis (ICD10-I70.0) and Emphysema (ICD10-J43.9). 5. Three vessel coronary artery atherosclerotic calcifications 6. Bilateral nonobstructing renal calculi 7. Mild prostate gland enlargement. Electronically Signed   By: Kerby Moors M.D.   On: 09/08/2017 18:15      IMPRESSION: Recurrent non-small cell lung cancer. The patient has oligometastasis  to the left and right adrenal gland. No other areas of active disease on recent CT scanning. Pt appears to be symptomatic with right flank pain  from his right adrenal gland metastasis.  Patient will be a good candidate for either SBRT or conventional radiation therapy directed at both the left and right adrenal gland. We discussed the general course of radiation, potential side effects, and toxicities with radiation with the patient and his wife today. The patient appears to understand and would like to proceed with this course of treatment. A consent form was signed and placed in his chart today.   PLAN: Patient will proceed with CT simulation later this week.        ------------------------------------------------  Blair Promise, PhD, MD    This document serves as a record of services personally performed by Gery Pray, MD. It was created on his behalf by [facility], a trained medical scribe. The creation of this record is based on the scribe's personal observations and the provider's statements to them. This document has been checked and approved by the attending provider.

## 2017-09-18 ENCOUNTER — Telehealth: Payer: Self-pay | Admitting: Internal Medicine

## 2017-09-18 ENCOUNTER — Ambulatory Visit: Payer: Medicare Other

## 2017-09-18 ENCOUNTER — Other Ambulatory Visit: Payer: Medicare Other

## 2017-09-18 ENCOUNTER — Ambulatory Visit: Payer: Medicare Other | Admitting: Oncology

## 2017-09-18 DIAGNOSIS — C7970 Secondary malignant neoplasm of unspecified adrenal gland: Secondary | ICD-10-CM | POA: Insufficient documentation

## 2017-09-18 NOTE — Telephone Encounter (Signed)
Spoke w/ pts wife, Mark Howell re appt time change for 10/02/17 per MM.  Pt to arrive at 12:30.

## 2017-09-19 ENCOUNTER — Other Ambulatory Visit: Payer: Self-pay | Admitting: *Deleted

## 2017-09-19 ENCOUNTER — Ambulatory Visit
Admission: RE | Admit: 2017-09-19 | Discharge: 2017-09-19 | Disposition: A | Discharge status: Home / Self Care | Payer: Medicare Other | Source: Ambulatory Visit | Attending: Radiation Oncology | Admitting: Radiation Oncology

## 2017-09-19 ENCOUNTER — Inpatient Hospital Stay: Payer: Medicare Other

## 2017-09-19 ENCOUNTER — Telehealth: Payer: Self-pay | Admitting: *Deleted

## 2017-09-19 ENCOUNTER — Inpatient Hospital Stay (HOSPITAL_BASED_OUTPATIENT_CLINIC_OR_DEPARTMENT_OTHER): Payer: Medicare Other | Admitting: Medical

## 2017-09-19 VITALS — BP 131/86 | HR 113 | Temp 98.1°F | Resp 18 | Ht 73.5 in | Wt 168.6 lb

## 2017-09-19 DIAGNOSIS — R062 Wheezing: Secondary | ICD-10-CM | POA: Diagnosis not present

## 2017-09-19 DIAGNOSIS — C7972 Secondary malignant neoplasm of left adrenal gland: Secondary | ICD-10-CM

## 2017-09-19 DIAGNOSIS — E860 Dehydration: Secondary | ICD-10-CM

## 2017-09-19 DIAGNOSIS — C3491 Malignant neoplasm of unspecified part of right bronchus or lung: Secondary | ICD-10-CM

## 2017-09-19 DIAGNOSIS — Z510 Encounter for antineoplastic radiation therapy: Secondary | ICD-10-CM | POA: Insufficient documentation

## 2017-09-19 DIAGNOSIS — R0602 Shortness of breath: Secondary | ICD-10-CM | POA: Diagnosis not present

## 2017-09-19 DIAGNOSIS — Z5112 Encounter for antineoplastic immunotherapy: Secondary | ICD-10-CM | POA: Diagnosis not present

## 2017-09-19 DIAGNOSIS — Z87891 Personal history of nicotine dependence: Secondary | ICD-10-CM | POA: Diagnosis not present

## 2017-09-19 DIAGNOSIS — C7971 Secondary malignant neoplasm of right adrenal gland: Secondary | ICD-10-CM

## 2017-09-19 DIAGNOSIS — C7970 Secondary malignant neoplasm of unspecified adrenal gland: Secondary | ICD-10-CM

## 2017-09-19 DIAGNOSIS — C3401 Malignant neoplasm of right main bronchus: Secondary | ICD-10-CM | POA: Diagnosis not present

## 2017-09-19 LAB — CMP (CANCER CENTER ONLY)
ALK PHOS: 91 U/L (ref 40–150)
ALT: 41 U/L (ref 0–55)
AST: 43 U/L — AB (ref 5–34)
Albumin: 3.7 g/dL (ref 3.5–5.0)
Anion gap: 12 — ABNORMAL HIGH (ref 3–11)
BILIRUBIN TOTAL: 0.5 mg/dL (ref 0.2–1.2)
BUN: 46 mg/dL — AB (ref 7–26)
CALCIUM: 10 mg/dL (ref 8.4–10.4)
CO2: 20 mmol/L — ABNORMAL LOW (ref 22–29)
CREATININE: 1.45 mg/dL — AB (ref 0.70–1.30)
Chloride: 102 mmol/L (ref 98–109)
GFR, EST AFRICAN AMERICAN: 52 mL/min — AB (ref >60)
GFR, EST NON AFRICAN AMERICAN: 45 mL/min — AB (ref >60)
Glucose, Bld: 162 mg/dL — ABNORMAL HIGH (ref 70–140)
Potassium: 5.5 mmol/L — ABNORMAL HIGH (ref 3.5–5.1)
Sodium: 134 mmol/L — ABNORMAL LOW (ref 136–145)
Total Protein: 8.2 g/dL (ref 6.4–8.3)

## 2017-09-19 LAB — CBC WITH DIFFERENTIAL (CANCER CENTER ONLY)
Basophils Absolute: 0 10*3/uL (ref 0.0–0.1)
Basophils Relative: 0 %
Eosinophils Absolute: 0 10*3/uL (ref 0.0–0.5)
Eosinophils Relative: 1 %
HEMATOCRIT: 33.2 % — AB (ref 38.4–49.9)
HEMOGLOBIN: 11 g/dL — AB (ref 13.0–17.1)
LYMPHS ABS: 0.7 10*3/uL — AB (ref 0.9–3.3)
LYMPHS PCT: 19 %
MCH: 30.5 pg (ref 27.2–33.4)
MCHC: 33.1 g/dL (ref 32.0–36.0)
MCV: 92.2 fL (ref 79.3–98.0)
Monocytes Absolute: 0.5 10*3/uL (ref 0.1–0.9)
Monocytes Relative: 15 %
NEUTROS ABS: 2.2 10*3/uL (ref 1.5–6.5)
NEUTROS PCT: 65 %
Platelet Count: 101 10*3/uL — ABNORMAL LOW (ref 140–400)
RBC: 3.6 MIL/uL — AB (ref 4.20–5.82)
RDW: 22.5 % — ABNORMAL HIGH (ref 11.0–14.6)
WBC Count: 3.4 10*3/uL — ABNORMAL LOW (ref 4.0–10.3)

## 2017-09-19 MED ORDER — ALBUTEROL SULFATE (2.5 MG/3ML) 0.083% IN NEBU
INHALATION_SOLUTION | RESPIRATORY_TRACT | Status: AC
  Filled 2017-09-19: qty 3

## 2017-09-19 MED ORDER — SODIUM CHLORIDE 0.9 % IV SOLN
Freq: Once | INTRAVENOUS | Status: AC
  Administered 2017-09-19: 15:00:00 via INTRAVENOUS

## 2017-09-19 MED ORDER — ALBUTEROL SULFATE (2.5 MG/3ML) 0.083% IN NEBU
2.5000 mg | INHALATION_SOLUTION | Freq: Once | RESPIRATORY_TRACT | Status: AC
  Administered 2017-09-19: 2.5 mg via RESPIRATORY_TRACT
  Filled 2017-09-19: qty 3

## 2017-09-19 MED ORDER — SODIUM CHLORIDE 0.9% FLUSH
10.0000 mL | Freq: Once | INTRAVENOUS | Status: AC
  Administered 2017-09-19: 10 mL via INTRAVENOUS
  Filled 2017-09-19: qty 10

## 2017-09-19 MED ORDER — HEPARIN SOD (PORK) LOCK FLUSH 100 UNIT/ML IV SOLN
500.0000 [IU] | Freq: Once | INTRAVENOUS | Status: AC
  Administered 2017-09-19: 500 [IU] via INTRAVENOUS
  Filled 2017-09-19: qty 5

## 2017-09-19 NOTE — Progress Notes (Signed)
  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  Identity was confirmed.  All relevant records and images related to the planned course of therapy were reviewed.  The patient freely provided informed written consent to proceed with treatment after reviewing the details related to the planned course of therapy. The consent form was witnessed and verified by the simulation staff.  Then, the patient was set-up in a stable reproducible  supine position for radiation therapy.  A BodyFix immobilization pillow was fabricated for reproducible positioning.  Then I personally applied the abdominal compression paddle to limit respiratory excursion.  4D respiratoy motion management CT images were obtained.  Surface markings were placed.  The CT images were loaded into the planning software.  Then, using Cine, MIP, and standard views, the internal target volume (ITV) and planning target volumes (PTV) were delinieated, and avoidance structures were contoured.  Treatment planning then occurred.  The radiation prescription was entered and confirmed.  A total of two complex treatment devices were fabricated in the form of the BodyFix immobilization pillow and a neck accuform cushion.  I have requested : 3D Simulation  I have requested a DVH of the following structures: Heart, Lungs, Esophagus, Chest Wall, Brachial Plexus, Major Blood Vessels, Liver, pancreas, stomach, small bowel and targets.  PLAN:  The patient will receive 50 Gy in 5 fractions directed at both the left and right adrenal gland.  -----------------------------------  Blair Promise, PhD, MD

## 2017-09-19 NOTE — Telephone Encounter (Signed)
Wife stopped RN in waiting room requesting pt be seen by [facility]. States " I'm really worried about Mark Howell. He just doesn't look good. Something is going on." Pt currently in Radiation. Reviewed with MD, message to scheduling for pt to be evaluated at [facility]. Labs ordered . Wife notified.

## 2017-09-19 NOTE — Patient Instructions (Addendum)
Dehydration, Adult Dehydration is when there is not enough fluid or water in your body. This happens when you lose more fluids than you take in. Dehydration can range from mild to very bad. It should be treated right away to keep it from getting very bad. Symptoms of mild dehydration may include:  Thirst.  Dry lips.  Slightly dry mouth.  Dry, warm skin.  Dizziness. Symptoms of moderate dehydration may include:  Very dry mouth.  Muscle cramps.  Dark pee (urine). Pee may be the color of tea.  Your body making less pee.  Your eyes making fewer tears.  Heartbeat that is uneven or faster than normal (palpitations).  Headache.  Light-headedness, especially when you stand up from sitting.  Fainting (syncope). Symptoms of very bad dehydration may include:  Changes in skin, such as: ? Cold and clammy skin. ? Blotchy (mottled) or pale skin. ? Skin that does not quickly return to normal after being lightly pinched and let go (poor skin turgor).  Changes in body fluids, such as: ? Feeling very thirsty. ? Your eyes making fewer tears. ? Not sweating when body temperature is high, such as in hot weather. ? Your body making very little pee.  Changes in vital signs, such as: ? Weak pulse. ? Pulse that is more than 100 beats a minute when you are sitting still. ? Fast breathing. ? Low blood pressure.  Other changes, such as: ? Sunken eyes. ? Cold hands and feet. ? Confusion. ? Lack of energy (lethargy). ? Trouble waking up from sleep. ? Short-term weight loss. ? Unconsciousness. Follow these instructions at home:  If told by your doctor, drink an ORS: ? Make an ORS by using instructions on the package. ? Start by drinking small amounts, about  cup (120 mL) every 5-10 minutes. ? Slowly drink more until you have had the amount that your doctor said to have.  Drink enough clear fluid to keep your pee clear or pale yellow. If you were told to drink an ORS, finish the ORS  first, then start slowly drinking clear fluids. Drink fluids such as: ? Water. Do not drink only water by itself. Doing that can make the salt (sodium) level in your body get too low (hyponatremia). ? Ice chips. ? Fruit juice that you have added water to (diluted). ? Low-calorie sports drinks.  Avoid: ? Alcohol. ? Drinks that have a lot of sugar. These include high-calorie sports drinks, fruit juice that does not have water added, and soda. ? Caffeine. ? Foods that are greasy or have a lot of fat or sugar.  Take over-the-counter and prescription medicines only as told by your doctor.  Do not take salt tablets. Doing that can make the salt level in your body get too high (hypernatremia).  Eat foods that have minerals (electrolytes). Examples include bananas, oranges, potatoes, tomatoes, and spinach.  Keep all follow-up visits as told by your doctor. This is important. Contact a doctor if:  You have belly (abdominal) pain that: ? Gets worse. ? Stays in one area (localizes).  You have a rash.  You have a stiff neck.  You get angry or annoyed more easily than normal (irritability).  You are more sleepy than normal.  You have a harder time waking up than normal.  You feel: ? Weak. ? Dizzy. ? Very thirsty.  You have peed (urinated) only a small amount of very dark pee during 6-8 hours. Get help right away if:  You have symptoms of   very bad dehydration.  You cannot drink fluids without throwing up (vomiting).  Your symptoms get worse with treatment.  You have a fever.  You have a very bad headache.  You are throwing up or having watery poop (diarrhea) and it: ? Gets worse. ? Does not go away.  You have blood or something green (bile) in your throw-up.  You have blood in your poop (stool). This may cause poop to look black and tarry.  You have not peed in 6-8 hours.  You pass out (faint).  Your heart rate when you are sitting still is more than 100 beats a  minute.  You have trouble breathing. This information is not intended to replace advice given to you by your health care provider. Make sure you discuss any questions you have with your health care provider. Document Released: 02/11/2009 Document Revised: 11/05/2015 Document Reviewed: 06/11/2015 Elsevier Interactive Patient Education  2018 [place].  Dehydration, Adult Dehydration is when there is not enough fluid or water in your body. This happens when you lose more fluids than you take in. Dehydration can range from mild to very bad. It should be treated right away to keep it from getting very bad. Symptoms of mild dehydration may include:  Thirst.  Dry lips.  Slightly dry mouth.  Dry, warm skin.  Dizziness. Symptoms of moderate dehydration may include:  Very dry mouth.  Muscle cramps.  Dark pee (urine). Pee may be the color of tea.  Your body making less pee.  Your eyes making fewer tears.  Heartbeat that is uneven or faster than normal (palpitations).  Headache.  Light-headedness, especially when you stand up from sitting.  Fainting (syncope). Symptoms of very bad dehydration may include:  Changes in skin, such as: ? Cold and clammy skin. ? Blotchy (mottled) or pale skin. ? Skin that does not quickly return to normal after being lightly pinched and let go (poor skin turgor).  Changes in body fluids, such as: ? Feeling very thirsty. ? Your eyes making fewer tears. ? Not sweating when body temperature is high, such as in hot weather. ? Your body making very little pee.  Changes in vital signs, such as: ? Weak pulse. ? Pulse that is more than 100 beats a minute when you are sitting still. ? Fast breathing. ? Low blood pressure.  Other changes, such as: ? Sunken eyes. ? Cold hands and feet. ? Confusion. ? Lack of energy (lethargy). ? Trouble waking up from sleep. ? Short-term weight loss. ? Unconsciousness. Follow these instructions at  home:  If told by your doctor, drink an ORS: ? Make an ORS by using instructions on the package. ? Start by drinking small amounts, about  cup (120 mL) every 5-10 minutes. ? Slowly drink more until you have had the amount that your doctor said to have.  Drink enough clear fluid to keep your pee clear or pale yellow. If you were told to drink an ORS, finish the ORS first, then start slowly drinking clear fluids. Drink fluids such as: ? Water. Do not drink only water by itself. Doing that can make the salt (sodium) level in your body get too low (hyponatremia). ? Ice chips. ? Fruit juice that you have added water to (diluted). ? Low-calorie sports drinks.  Avoid: ? Alcohol. ? Drinks that have a lot of sugar. These include high-calorie sports drinks, fruit juice that does not have water added, and soda. ? Caffeine. ? Foods that are greasy or have  a lot of fat or sugar.  Take over-the-counter and prescription medicines only as told by your doctor.  Do not take salt tablets. Doing that can make the salt level in your body get too high (hypernatremia).  Eat foods that have minerals (electrolytes). Examples include bananas, oranges, potatoes, tomatoes, and spinach.  Keep all follow-up visits as told by your doctor. This is important. Contact a doctor if:  You have belly (abdominal) pain that: ? Gets worse. ? Stays in one area (localizes).  You have a rash.  You have a stiff neck.  You get angry or annoyed more easily than normal (irritability).  You are more sleepy than normal.  You have a harder time waking up than normal.  You feel: ? Weak. ? Dizzy. ? Very thirsty.  You have peed (urinated) only a small amount of very dark pee during 6-8 hours. Get help right away if:  You have symptoms of very bad dehydration.  You cannot drink fluids without throwing up (vomiting).  Your symptoms get worse with treatment.  You have a fever.  You have a very bad headache.  You  are throwing up or having watery poop (diarrhea) and it: ? Gets worse. ? Does not go away.  You have blood or something green (bile) in your throw-up.  You have blood in your poop (stool). This may cause poop to look black and tarry.  You have not peed in 6-8 hours.  You pass out (faint).  Your heart rate when you are sitting still is more than 100 beats a minute.  You have trouble breathing. This information is not intended to replace advice given to you by your health care provider. Make sure you discuss any questions you have with your health care provider. Document Released: 02/11/2009 Document Revised: 11/05/2015 Document Reviewed: 06/11/2015 Elsevier Interactive Patient Education  2018 [employer].

## 2017-09-21 NOTE — Progress Notes (Signed)
[facility] Progress Note   COLLIE WERNICK [id number] 1940/12/05 77 y.o.  Mark Howell is managed by Dr. Fanny Bien. [name]  Actively treated with chemotherapy: yes  Current Therapy: Bevacizumab and Alimta  Last Treated: 09/11/2017 (cycle 4, day 1)  Assessment: Plan:    Dehydration - Plan: 0.9 %  sodium chloride infusion, sodium chloride flush (NS) 0.9 % injection 10 mL, heparin lock flush 100 unit/mL  Wheezing - Plan: albuterol (PROVENTIL) (2.5 MG/3ML) 0.083% nebulizer solution 2.5 mg   Dehydration: Patient is given 1 L of normal saline IV today.  I have encouraged him to attempt to dramatically increase his fluid intake.  Wheezing: The patient was given an albuterol nebulizer treatment.  He is awaiting a pulmonology appointment with Dr. Baltazar Apo.  Please see After Visit Summary for patient specific instructions.  Future Appointments  Date Time Provider [place]  10/01/2017 12:00 PM Gery Pray, MD [facility] None  10/02/2017 12:30 PM [facility] LAB 5 [facility] None  10/02/2017 12:45 PM [facility] FLUSH NURSE [facility] None  10/02/2017  1:15 PM Curt Bears, MD [place] None  10/02/2017  2:00 PM [facility] F20 [facility] None  10/03/2017 12:00 PM Gery Pray, MD [facility] None  10/05/2017 12:00 PM Gery Pray, MD [facility] None  10/09/2017 11:00 AM Gery Pray, MD [facility] None  10/11/2017 11:00 AM Gery Pray, MD [facility] None  10/23/2017  8:45 AM [facility] LAB 1 [facility] None  10/23/2017  9:00 AM [facility] FLUSH NURSE [facility] None  10/23/2017  9:15 AM Curt Bears, MD [facility] None  10/23/2017 10:15 AM [facility] G23 [facility] None  11/13/2017  8:45 AM [facility] LAB 1 [facility] None  11/13/2017  9:00 AM [facility] FLUSH NURSE [facility] None  11/13/2017  9:30 AM Curt Bears, MD [facility] None  11/13/2017 10:15 AM [facility] G23 [facility] None    No orders of the defined types were  placed in this encounter.      Subjective:   Patient ID:  Mark Howell is a 77 y.o. (DOB 01-Jun-1940) male.  Chief Complaint:  Chief Complaint  Patient presents with  . Shortness of Breath    HPI Mark Howell is a 77 year old male with a history of a metastatic non-small cell lung cancer, adenocarcinoma which was first diagnosed as being an unresectable stage IIa non-small cell lung cancer.  He originally presented with a right hilar mass and right hilar adenopathy in September 2017.  He is currently treated with bevacizumab and Alimta with his last treatment completed on 09/11/2017 (cycle 4, day 1).  He presents to the clinic with his wife.  They are pending a pulmonology visit.  The patient's wife states that Mr. Love is significantly weak and short of breath.  Her wish today is that he would get better.  She states that she cannot understand why he is so weak.  His p.o. intake is poor.  He does not have an appetite.  They would like him to receive IV fluids today.  I have attempted to explain to the patient and his wife that he has been treated with radiation therapy and chemotherapy, he has a stage IV lung cancer and is going to be seen by pulmonology for possible COPD.  I attempted to explain to the patient and his wife that there is a possibility that he may never feel better than he is at this point.  They both seem very surprised by this comment. Mr. is preparing forFallin radiation to the adrenal glands.  Medications: I  have reviewed the patient's current medications.  Allergies:  Allergies  Allergen Reactions  . Hydrocodone Rash  . Oxycodone Rash    Past Medical History:  Diagnosis Date  . Arthritis   . Complication of anesthesia    problems voiding post op  . Diabetes mellitus without complication ([place])   . Full dentures   . GERD (gastroesophageal reflux disease)   . History of radiation therapy 01/25/16-02/28/16   right lung 45 Gy  . History of radiation therapy  04/17/16-05/02/16   right lung boost 20 Gy in 10 fractions cumulative dose 65 gray  . HOH ([place])   . Hyperlipidemia   . lung ca dx'd 01/2016  . Lung mass 12/30/2015  . Odynophagia 04/06/2016  . Psoriatic arthritis ([place])   . Snores   . Wears glasses     Past Surgical History:  Procedure Laterality Date  . COLONOSCOPY    . HEMORRHOID SURGERY N/A 10/01/2013   Procedure: EXAM UNDER ANESTHESIA  AND EXCISIONAL HEMORRHOIDECTOMY WITH HEMORRHOID BANDING X 2;  Surgeon: Gayland Curry, MD;  Location: [place];  Service: General;  Laterality: N/A;  . INGUINAL HERNIA REPAIR  01/04/2012   Procedure: LAPAROSCOPIC INGUINAL HERNIA;  Surgeon: Gayland Curry, MD,FACS;  Location: [facility];  Service: General;  Laterality: Right;  . IR FLUORO GUIDE PORT INSERTION RIGHT  01/02/2017  . IR US GUIDE VASC ACCESS RIGHT  01/02/2017  . VIDEO BRONCHOSCOPY WITH ENDOBRONCHIAL ULTRASOUND N/A 12/31/2015   Procedure: VIDEO BRONCHOSCOPY WITH ENDOBRONCHIAL ULTRASOUND transbronchial biopsy of node 10 R lymph node;  Surgeon: Grace Isaac, MD;  Location: [facility] OR;  Service: Thoracic;  Laterality: N/A;    Family History  Problem Relation Age of Onset  . Stroke Father   . Diabetes Father   . Heart disease Father   . Cancer Brother        pancreatic    Social History   Socioeconomic History  . Marital status: Married    Spouse name: Not on file  . Number of children: Not on file  . Years of education: Not on file  . Highest education level: Not on file  Occupational History  . Not on file  Social Needs  . Financial resource strain: Not on file  . Food insecurity:    Worry: Not on file    Inability: Not on file  . Transportation needs:    Medical: Not on file    Non-medical: Not on file  Tobacco Use  . Smoking status: Former Smoker    Packs/day: 0.50    Years: 50.00    Pack years: 25.00    Types: Cigarettes    Last attempt to quit: 11/30/2015    Years since quitting: 1.8  . Smokeless  tobacco: Never Used  Substance and Sexual Activity  . Alcohol use: Not Currently  . Drug use: No  . Sexual activity: Not Currently  Lifestyle  . Physical activity:    Days per week: Not on file    Minutes per session: Not on file  . Stress: Not on file  Relationships  . Social connections:    Talks on phone: Not on file    Gets together: Not on file    Attends religious service: Not on file    Active member of club or organization: Not on file    Attends meetings of clubs or organizations: Not on file    Relationship status: Not on file  . Intimate partner violence:  Fear of current or ex partner: Not on file    Emotionally abused: Not on file    Physically abused: Not on file    Forced sexual activity: Not on file  Other Topics Concern  . Not on file  Social History Narrative  . Not on file    Past Medical History, Surgical history, Social history, and Family history were reviewed and updated as appropriate.   Please see review of systems for further details on the patient's review from today.   Review of Systems:  Review of Systems  Constitutional: Positive for appetite change and fatigue. Negative for chills, diaphoresis and fever.  HENT: Negative for trouble swallowing.   Respiratory: Positive for shortness of breath. Negative for cough, choking, chest tightness and wheezing.   Cardiovascular: Negative for chest pain, palpitations and leg swelling.  Neurological: Positive for weakness.    Objective:   Physical Exam:  BP 131/86 (BP Location: Left Arm, Patient Position: Sitting)   Pulse (!) 113   Temp 98.1 F (36.7 C) (Oral)   Resp 18   Ht 6' 1.5" (1.867 m)   Wt 168 lb 9.6 oz (76.5 kg)   SpO2 100%   BMI 21.94 kg/m  ECOG: 1  Physical Exam  Constitutional:  The patient is an elderly male who appears to be fatigued but in no acute distress.  HENT:  Head: Normocephalic and atraumatic.  Cardiovascular: S1 normal, S2 normal and normal heart sounds.  Tachycardia present. Exam reveals no friction rub.  No murmur heard. Pulmonary/Chest: Effort normal. No respiratory distress. He has no decreased breath sounds. He has wheezes in the right upper field, the left upper field, the left middle field and the left lower field. He has rales in the right upper field, the left upper field, the left middle field and the left lower field.  Abdominal: Soft. Bowel sounds are normal. He exhibits no distension and no mass. There is no tenderness. There is no rebound and no guarding.  Skin: Skin is warm and dry.   Pre-albuterol exam: See above physical exam  Post-albuterol exam: There are wheezes in the left upper middle and lower lung feels cleared.  Rales were milder but continued in the right upper left upper, left middle, and left lower lung fields.   Lab Review:     Component Value Date/Time   NA 134 (L) 09/19/2017 1406   NA 135 (L) 04/30/2017 0824   K 5.5 (H) 09/19/2017 1406   K 4.4 04/30/2017 0824   CL 102 09/19/2017 1406   CO2 20 (L) 09/19/2017 1406   CO2 19 (L) 04/30/2017 0824   GLUCOSE 162 (H) 09/19/2017 1406   GLUCOSE 277 (H) 04/30/2017 0824   BUN 46 (H) 09/19/2017 1406   BUN 28.0 (H) 04/30/2017 0824   CREATININE 1.45 (H) 09/19/2017 1406   CREATININE 1.5 (H) 04/30/2017 0824   CALCIUM 10.0 09/19/2017 1406   CALCIUM 7.6 (L) 04/30/2017 0824   PROT 8.2 09/19/2017 1406   PROT 7.7 04/30/2017 0824   ALBUMIN 3.7 09/19/2017 1406   ALBUMIN 3.7 04/30/2017 0824   AST 43 (H) 09/19/2017 1406   AST 22 04/30/2017 0824   ALT 41 09/19/2017 1406   ALT 19 04/30/2017 0824   ALKPHOS 91 09/19/2017 1406   ALKPHOS 88 04/30/2017 0824   BILITOT 0.5 09/19/2017 1406   BILITOT 0.57 04/30/2017 0824   GFRNONAA 45 (L) 09/19/2017 1406   GFRAA 52 (L) 09/19/2017 1406       Component Value Date/Time  WBC 3.4 (L) 09/19/2017 1406   WBC 12.6 (H) 06/12/2017 0153   RBC 3.60 (L) 09/19/2017 1406   HGB 11.0 (L) 09/19/2017 1406   HGB 10.9 (L) 04/30/2017 0824   HCT  33.2 (L) 09/19/2017 1406   HCT 32.7 (L) 04/30/2017 0824   PLT 101 (L) 09/19/2017 1406   PLT 118 (L) 04/30/2017 0824   MCV 92.2 09/19/2017 1406   MCV 101.9 (H) 04/30/2017 0824   MCH 30.5 09/19/2017 1406   MCHC 33.1 09/19/2017 1406   RDW 22.5 (H) 09/19/2017 1406   RDW 19.3 (H) 04/30/2017 0824   LYMPHSABS 0.7 (L) 09/19/2017 1406   LYMPHSABS 1.3 04/30/2017 0824   MONOABS 0.5 09/19/2017 1406   MONOABS 1.2 (H) 04/30/2017 0824   EOSABS 0.0 09/19/2017 1406   EOSABS 0.0 04/30/2017 0824   BASOSABS 0.0 09/19/2017 1406   BASOSABS 0.0 04/30/2017 0824   -------------------------------  Imaging from last 24 hours (if applicable):  Radiology interpretation: Ct Chest W Contrast  Result Date: 09/08/2017 CLINICAL DATA:  Lung cancer restaging. EXAM: CT CHEST, ABDOMEN, AND PELVIS WITH CONTRAST TECHNIQUE: Multidetector CT imaging of the chest, abdomen and pelvis was performed following the standard protocol during bolus administration of intravenous contrast. CONTRAST:  170mL OMNIPAQUE IOHEXOL 300 MG/ML  SOLN COMPARISON:  CT AP 05/17/2017, CT a chest 05/09/2017 and CT chest abdomen and pelvis from 03/08/2017. FINDINGS: CT CHEST FINDINGS Cardiovascular: The heart size appears normal. Aortic atherosclerosis noted. No pericardial effusion. Calcification in the LAD and RCA and left circumflex coronary arteries noted. Mediastinum/Nodes: Normal appearance of the thyroid gland. The trachea appears patent and is midline. Normal appearance of the esophagus. No enlarged mediastinal or hilar lymph nodes. Lungs/Pleura: No pleural effusion. Moderate changes of emphysema. Paramediastinal masslike architectural distortion and fibrosis within the right upper lobe, right middle lobe and right lower lobe identified compatible with changes due to external beam radiation. Right middle lobe subpleural nodule measures 7 mm, image 88/6. Unchanged from 05/09/2017. Pulmonary nodule within the right lower lobe measures 4 mm and is  unchanged from 03/08/17. Posterior left lower lobe lung nodule measures 8 mm, image 105/6. Unchanged from previous exam. Musculoskeletal: No suspicious bone lesions. CT ABDOMEN PELVIS FINDINGS Hepatobiliary: No focal liver abnormality. Normal appearance of the gallbladder. No biliary dilatation. Pancreas: Unremarkable. No pancreatic ductal dilatation or surrounding inflammatory changes. Spleen: Normal in size without focal abnormality. 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 The index lesion within the right adrenal gland measures 2.9 by 2.1 cm, image 62/2. Previously 2.0 x 1.9 cm. The left adrenal gland metastasis measures 3.8 by 2.9 cm, image 66/2. Previously 3.5 by 2.8 cm. Bilateral renal cortical scarring identified. Multiple right renal calculi measure up to 5 mm. Punctate stone within the upper pole of the scratch set small left renal calculi are also stable. Simple appearing cyst arises from the inferior pole of left kidney measuring 1.2 cm. No hydronephrosis identified bilaterally. Urinary bladder appears normal. Stomach/Bowel: Stomach normal. The small bowel loops have a normal course and caliber without obstruction. No pathologic dilatation of the colon. Distal colonic diverticula noted without acute inflammation. Vascular/Lymphatic: Aortic atherosclerosis. No aneurysm. No abdominal or pelvic adenopathy. No inguinal adenopathy. Reproductive: The prostate gland measures 3.6 by 5.5 by 4.5 cm (volume = 47 cm^3). Other: There is no ascites or focal fluid collection. Musculoskeletal: Degenerative disc disease within the lumbar spine. No suspicious bone lesions. IMPRESSION: 1. 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 2. Stable changes of external beam  radiation within the right lung. Small peripheral nodules within the right middle lobe, right lower lobe and left lower lobe are unchanged in the interval. 3. No new sites of disease. 4. Aortic  Atherosclerosis (ICD10-I70.0) and Emphysema (ICD10-J43.9). 5. Three vessel coronary artery atherosclerotic calcifications 6. Bilateral nonobstructing renal calculi 7. Mild prostate gland enlargement. Electronically Signed   By: Kerby Moors M.D.   On: 09/08/2017 18:15   Ct Abdomen Pelvis W Contrast  Result Date: 09/08/2017 CLINICAL DATA:  Lung cancer restaging. EXAM: CT CHEST, ABDOMEN, AND PELVIS WITH CONTRAST TECHNIQUE: Multidetector CT imaging of the chest, abdomen and pelvis was performed following the standard protocol during bolus administration of intravenous contrast. CONTRAST:  169mL OMNIPAQUE IOHEXOL 300 MG/ML  SOLN COMPARISON:  CT AP 05/17/2017, CT a chest 05/09/2017 and CT chest abdomen and pelvis from 03/08/2017. FINDINGS: CT CHEST FINDINGS Cardiovascular: The heart size appears normal. Aortic atherosclerosis noted. No pericardial effusion. Calcification in the LAD and RCA and left circumflex coronary arteries noted. Mediastinum/Nodes: Normal appearance of the thyroid gland. The trachea appears patent and is midline. Normal appearance of the esophagus. No enlarged mediastinal or hilar lymph nodes. Lungs/Pleura: No pleural effusion. Moderate changes of emphysema. Paramediastinal masslike architectural distortion and fibrosis within the right upper lobe, right middle lobe and right lower lobe identified compatible with changes due to external beam radiation. Right middle lobe subpleural nodule measures 7 mm, image 88/6. Unchanged from 05/09/2017. Pulmonary nodule within the right lower lobe measures 4 mm and is unchanged from 03/08/17. Posterior left lower lobe lung nodule measures 8 mm, image 105/6. Unchanged from previous exam. Musculoskeletal: No suspicious bone lesions. CT ABDOMEN PELVIS FINDINGS Hepatobiliary: No focal liver abnormality. Normal appearance of the gallbladder. No biliary dilatation. Pancreas: Unremarkable. No pancreatic ductal dilatation or surrounding inflammatory changes.  Spleen: Normal in size without focal abnormality. 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 The index lesion within the right adrenal gland measures 2.9 by 2.1 cm, image 62/2. Previously 2.0 x 1.9 cm. The left adrenal gland metastasis measures 3.8 by 2.9 cm, image 66/2. Previously 3.5 by 2.8 cm. Bilateral renal cortical scarring identified. Multiple right renal calculi measure up to 5 mm. Punctate stone within the upper pole of the scratch set small left renal calculi are also stable. Simple appearing cyst arises from the inferior pole of left kidney measuring 1.2 cm. No hydronephrosis identified bilaterally. Urinary bladder appears normal. Stomach/Bowel: Stomach normal. The small bowel loops have a normal course and caliber without obstruction. No pathologic dilatation of the colon. Distal colonic diverticula noted without acute inflammation. Vascular/Lymphatic: Aortic atherosclerosis. No aneurysm. No abdominal or pelvic adenopathy. No inguinal adenopathy. Reproductive: The prostate gland measures 3.6 by 5.5 by 4.5 cm (volume = 47 cm^3). Other: There is no ascites or focal fluid collection. Musculoskeletal: Degenerative disc disease within the lumbar spine. No suspicious bone lesions. IMPRESSION: 1. 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 2. Stable changes of external beam radiation within the right lung. Small peripheral nodules within the right middle lobe, right lower lobe and left lower lobe are unchanged in the interval. 3. No new sites of disease. 4. Aortic Atherosclerosis (ICD10-I70.0) and Emphysema (ICD10-J43.9). 5. Three vessel coronary artery atherosclerotic calcifications 6. Bilateral nonobstructing renal calculi 7. Mild prostate gland enlargement. Electronically Signed   By: Kerby Moors M.D.   On: 09/08/2017 18:15

## 2017-09-27 DIAGNOSIS — C7972 Secondary malignant neoplasm of left adrenal gland: Secondary | ICD-10-CM | POA: Diagnosis not present

## 2017-10-01 ENCOUNTER — Ambulatory Visit
Admission: RE | Admit: 2017-10-01 | Discharge: 2017-10-01 | Disposition: A | Discharge status: Home / Self Care | Payer: Medicare Other | Source: Ambulatory Visit | Attending: Radiation Oncology | Admitting: Radiation Oncology

## 2017-10-01 DIAGNOSIS — C3491 Malignant neoplasm of unspecified part of right bronchus or lung: Secondary | ICD-10-CM | POA: Insufficient documentation

## 2017-10-01 DIAGNOSIS — C7971 Secondary malignant neoplasm of right adrenal gland: Secondary | ICD-10-CM | POA: Insufficient documentation

## 2017-10-01 DIAGNOSIS — Z510 Encounter for antineoplastic radiation therapy: Secondary | ICD-10-CM | POA: Diagnosis not present

## 2017-10-01 DIAGNOSIS — C7970 Secondary malignant neoplasm of unspecified adrenal gland: Secondary | ICD-10-CM

## 2017-10-01 DIAGNOSIS — C7972 Secondary malignant neoplasm of left adrenal gland: Secondary | ICD-10-CM | POA: Diagnosis present

## 2017-10-01 NOTE — Progress Notes (Signed)
  Radiation Oncology         [phone] ________________________________  Name: KEEVAN WOLZ MRN: [id number]  Date: 10/01/2017  DOB: [birth year]  Stereotactic Body Radiotherapy Treatment Procedure Note  NARRATIVE:  KEDDRICK WYNE was brought to the stereotactic radiation treatment machine and placed supine on the CT couch. The patient was set up for stereotactic body radiotherapy on the body fix pillow.  3D TREATMENT PLANNING AND DOSIMETRY:  The patient's radiation plan was reviewed and approved prior to starting treatment.  It showed 3-dimensional radiation distributions overlaid onto the planning CT.  The [facility] for the target structures as well as the organs at risk were reviewed. The documentation of this is filed in the radiation oncology EMR.  SIMULATION VERIFICATION:  The patient underwent CT imaging on the treatment unit.  These were carefully aligned to document that the ablative radiation dose would cover the target volume and maximally spare the nearby organs at risk according to the planned distribution.  SPECIAL TREATMENT PROCEDURE: [name] received high dose ablative stereotactic body radiotherapy to the planned target volume without unforeseen complications. Treatment was delivered uneventfully. The high doses associated with stereotactic body radiotherapy and the significant potential risks require careful treatment set up and patient monitoring constituting a special treatment procedure   STEREOTACTIC TREATMENT MANAGEMENT:  Following delivery, the patient was evaluated clinically. The patient tolerated treatment without significant acute effects, and was discharged to home in stable condition.    PLAN: Continue treatment as planned.  ________________________________  Blair Promise, PhD, MD

## 2017-10-02 ENCOUNTER — Encounter: Payer: Self-pay | Admitting: Internal Medicine

## 2017-10-02 ENCOUNTER — Inpatient Hospital Stay: Payer: Medicare Other | Attending: Internal Medicine | Admitting: Internal Medicine

## 2017-10-02 ENCOUNTER — Inpatient Hospital Stay: Payer: Medicare Other

## 2017-10-02 ENCOUNTER — Other Ambulatory Visit: Payer: Medicare Other

## 2017-10-02 VITALS — BP 104/79 | HR 96 | Temp 98.1°F | Resp 23 | Ht 73.5 in | Wt 171.4 lb

## 2017-10-02 DIAGNOSIS — D6481 Anemia due to antineoplastic chemotherapy: Secondary | ICD-10-CM

## 2017-10-02 DIAGNOSIS — C7972 Secondary malignant neoplasm of left adrenal gland: Secondary | ICD-10-CM

## 2017-10-02 DIAGNOSIS — Z79899 Other long term (current) drug therapy: Secondary | ICD-10-CM | POA: Diagnosis not present

## 2017-10-02 DIAGNOSIS — R197 Diarrhea, unspecified: Secondary | ICD-10-CM | POA: Diagnosis not present

## 2017-10-02 DIAGNOSIS — Z87891 Personal history of nicotine dependence: Secondary | ICD-10-CM | POA: Diagnosis not present

## 2017-10-02 DIAGNOSIS — J449 Chronic obstructive pulmonary disease, unspecified: Secondary | ICD-10-CM | POA: Diagnosis not present

## 2017-10-02 DIAGNOSIS — Z95828 Presence of other vascular implants and grafts: Secondary | ICD-10-CM

## 2017-10-02 DIAGNOSIS — R634 Abnormal weight loss: Secondary | ICD-10-CM | POA: Diagnosis not present

## 2017-10-02 DIAGNOSIS — Z5112 Encounter for antineoplastic immunotherapy: Secondary | ICD-10-CM | POA: Diagnosis not present

## 2017-10-02 DIAGNOSIS — C3401 Malignant neoplasm of right main bronchus: Secondary | ICD-10-CM | POA: Insufficient documentation

## 2017-10-02 DIAGNOSIS — C3491 Malignant neoplasm of unspecified part of right bronchus or lung: Secondary | ICD-10-CM

## 2017-10-02 DIAGNOSIS — Z5111 Encounter for antineoplastic chemotherapy: Secondary | ICD-10-CM

## 2017-10-02 DIAGNOSIS — R531 Weakness: Secondary | ICD-10-CM

## 2017-10-02 DIAGNOSIS — E860 Dehydration: Secondary | ICD-10-CM | POA: Insufficient documentation

## 2017-10-02 DIAGNOSIS — C7971 Secondary malignant neoplasm of right adrenal gland: Secondary | ICD-10-CM | POA: Insufficient documentation

## 2017-10-02 DIAGNOSIS — C7970 Secondary malignant neoplasm of unspecified adrenal gland: Secondary | ICD-10-CM

## 2017-10-02 DIAGNOSIS — T451X5A Adverse effect of antineoplastic and immunosuppressive drugs, initial encounter: Secondary | ICD-10-CM

## 2017-10-02 LAB — CMP (CANCER CENTER ONLY)
ALBUMIN: 3.5 g/dL (ref 3.5–5.0)
ALT: 28 U/L (ref 0–55)
AST: 30 U/L (ref 5–34)
Alkaline Phosphatase: 82 U/L (ref 40–150)
Anion gap: 12 — ABNORMAL HIGH (ref 3–11)
BUN: 46 mg/dL — AB (ref 7–26)
CHLORIDE: 102 mmol/L (ref 98–109)
CO2: 19 mmol/L — ABNORMAL LOW (ref 22–29)
Calcium: 8.3 mg/dL — ABNORMAL LOW (ref 8.4–10.4)
Creatinine: 1.36 mg/dL — ABNORMAL HIGH (ref 0.70–1.30)
GFR, Est AFR Am: 56 mL/min — ABNORMAL LOW (ref >60)
GFR, Estimated: 49 mL/min — ABNORMAL LOW (ref >60)
GLUCOSE: 333 mg/dL — AB (ref 70–140)
POTASSIUM: 4.8 mmol/L (ref 3.5–5.1)
Sodium: 133 mmol/L — ABNORMAL LOW (ref 136–145)
Total Bilirubin: 0.3 mg/dL (ref 0.2–1.2)
Total Protein: 7.4 g/dL (ref 6.4–8.3)

## 2017-10-02 LAB — CBC WITH DIFFERENTIAL (CANCER CENTER ONLY)
BASOS ABS: 0 10*3/uL (ref 0.0–0.1)
BASOS PCT: 0 %
Eosinophils Absolute: 0 10*3/uL (ref 0.0–0.5)
Eosinophils Relative: 0 %
HEMATOCRIT: 30.1 % — AB (ref 38.4–49.9)
Hemoglobin: 9.7 g/dL — ABNORMAL LOW (ref 13.0–17.1)
Lymphocytes Relative: 8 %
Lymphs Abs: 0.8 10*3/uL — ABNORMAL LOW (ref 0.9–3.3)
MCH: 30.7 pg (ref 27.2–33.4)
MCHC: 32.2 g/dL (ref 32.0–36.0)
MCV: 95.3 fL (ref 79.3–98.0)
Monocytes Absolute: 0.5 10*3/uL (ref 0.1–0.9)
Monocytes Relative: 6 %
NEUTROS ABS: 8 10*3/uL — AB (ref 1.5–6.5)
NEUTROS PCT: 86 %
PLATELETS: 128 10*3/uL — AB (ref 140–400)
RBC: 3.16 MIL/uL — AB (ref 4.20–5.82)
RDW: 21.9 % — AB (ref 11.0–14.6)
WBC: 9.3 10*3/uL (ref 4.0–10.3)

## 2017-10-02 MED ORDER — SODIUM CHLORIDE 0.9 % IV SOLN
Freq: Once | INTRAVENOUS | Status: AC
  Administered 2017-10-02: 14:00:00 via INTRAVENOUS

## 2017-10-02 MED ORDER — HEPARIN SOD (PORK) LOCK FLUSH 100 UNIT/ML IV SOLN
500.0000 [IU] | Freq: Once | INTRAVENOUS | Status: AC | PRN
  Administered 2017-10-02: 500 [IU]
  Filled 2017-10-02: qty 5

## 2017-10-02 MED ORDER — SODIUM CHLORIDE 0.9 % IV SOLN
15.9000 mg/kg | Freq: Once | INTRAVENOUS | Status: AC
  Administered 2017-10-02: 1200 mg via INTRAVENOUS
  Filled 2017-10-02: qty 48

## 2017-10-02 MED ORDER — SODIUM CHLORIDE 0.9% FLUSH
10.0000 mL | Freq: Once | INTRAVENOUS | Status: AC
  Administered 2017-10-02: 10 mL
  Filled 2017-10-02: qty 10

## 2017-10-02 MED ORDER — PROCHLORPERAZINE MALEATE 10 MG PO TABS
ORAL_TABLET | ORAL | Status: AC
  Filled 2017-10-02: qty 1

## 2017-10-02 MED ORDER — PROCHLORPERAZINE MALEATE 10 MG PO TABS
10.0000 mg | ORAL_TABLET | Freq: Once | ORAL | Status: AC
  Administered 2017-10-02: 10 mg via ORAL

## 2017-10-02 MED ORDER — SODIUM CHLORIDE 0.9% FLUSH
10.0000 mL | INTRAVENOUS | Status: DC | PRN
  Administered 2017-10-02: 10 mL
  Filled 2017-10-02: qty 10

## 2017-10-02 MED ORDER — SODIUM CHLORIDE 0.9 % IV SOLN
410.0000 mg/m2 | Freq: Once | INTRAVENOUS | Status: AC
  Administered 2017-10-02: 800 mg via INTRAVENOUS
  Filled 2017-10-02: qty 20

## 2017-10-02 NOTE — Progress Notes (Signed)
[place] Telephone:([phone]) [phone]   Fax:(336) [phone]  OFFICE PROGRESS NOTE  [name], MD [place] [address]  DIAGNOSIS: Metastatic non-small cell lung cancer, adenocarcinoma initially diagnosed as Unresectable stage IIA (T1a, N1, M0) non-small cell lung cancer, adenocarcinoma presented with a right hilar mass and right hilar adenopathy diagnosed in September 2017. [facility]: Negative for EGFR, ALK, ROS 1 and BRAF mutations.  PRIOR THERAPY:  1) Status post concurrent chemoradiation with weekly carboplatin for AUC of 2 and paclitaxel 45 MG/M2 for 7 weeks. 2) Systemic chemotherapy with carboplatin for AUC of 5, Alimta 500 MG/M2 and Avastin 15 MG/KG every 3 weeks. First dose 01/08/2017. Status post 6 cycles.  Last dose was given April 30, 2002 stable disease.   CURRENT THERAPY: Maintenance treatment with Alimta 400 mg/M2 and Avastin 15 mg/KG every 3 weeks.  First dose June 11, 2017.  Status post 4 cycles.   INTERVAL HISTORY: Mark Howell 77 y.o. male returns to the clinic today for follow-up visit accompanied by his wife.  The patient is feeling fine today except for the persistent fatigue and weakness in his lower extremities.  He also has shortness breath with exertion and wheezing.  He denied having any chest pain but continues to have cough with no hemoptysis.  He has no fever or chills.  He has no nausea, vomiting, diarrhea or constipation.  He continues to tolerate his treatment with maintenance Alimta and Avastin fairly well.  He is here today for evaluation before starting cycle #5.   MEDICAL HISTORY: Past Medical History:  Diagnosis Date  . Arthritis   . Complication of anesthesia    problems voiding post op  . Diabetes mellitus without complication ([place])   . Full dentures   . GERD (gastroesophageal reflux disease)   . History of radiation therapy 01/25/16-02/28/16   right lung 45 Gy  . History of radiation therapy  04/17/16-05/02/16   right lung boost 20 Gy in 10 fractions cumulative dose 65 gray  . HOH (hard of hearing)   . Hyperlipidemia   . lung ca dx'd 01/2016  . Lung mass 12/30/2015  . Odynophagia 04/06/2016  . Psoriatic arthritis ([place])   . Snores   . Wears glasses     ALLERGIES:  is allergic to hydrocodone and oxycodone.  MEDICATIONS:  Current Outpatient Medications  Medication Sig Dispense Refill  . albuterol (VENTOLIN HFA) 108 (90 Base) MCG/ACT inhaler Inhale 2 puffs into the lungs every 6 (six) hours as needed for wheezing or shortness of breath.     Marland Kitchen aspirin EC 81 MG tablet Take 81 mg by mouth at bedtime.     . blood glucose meter kit and supplies KIT Dispense based on patient and insurance preference. Use up to four times daily as directed. (FOR ICD-9 250.00, 250.01). 1 each 0  . calcium carbonate (TUMS - DOSED IN MG ELEMENTAL CALCIUM) 500 MG chewable tablet Chew 1 tablet by mouth daily.    . Cetirizine HCl 10 MG TBDP Take 1 tablet by mouth daily.     . Coenzyme Q10 (CO Q 10 PO) Take 1 tablet by mouth daily.    . Continuous Blood Gluc Sensor MISC 1 each by Does not apply route as directed. Use as directed every 10 days. May dispense FreeStyle [employer] or similar. 2 each 2  . dexamethasone (DECADRON) 4 MG tablet TAKE 1 TABLET TWICE DAILY FOR 3 DAYS (DAY BEFORE, DAY OF, AND DAY  AFTER CHEMO) 40 tablet 0  . diphenoxylate-atropine (LOMOTIL) 2.5-0.025 MG tablet Take 2 tablets by mouth 4 (four) times daily as needed for diarrhea or loose stools. (Patient not taking: Reported on 09/19/2017) 40 tablet 1  . fluticasone-salmeterol (ADVAIR HFA) 115-21 MCG/ACT inhaler Inhale 2 puffs into the lungs 2 (two) times daily. 1 Inhaler 12  . folic acid (FOLVITE) 1 MG tablet Take 1 tablet (1 mg total) by mouth daily. 30 tablet 2  . glipiZIDE (GLUCOTROL) 10 MG tablet Take 1 tablet (10 mg total) by mouth daily before breakfast. (Patient taking differently: Take 10 mg by mouth daily before breakfast. ) 30  tablet 1  . lidocaine (XYLOCAINE) 2 % solution Use as directed 20 mLs in the mouth or throat every 3 (three) hours as needed for mouth pain. 100 mL 2  . lidocaine-prilocaine (EMLA) cream Apply 1 application topically as needed. 30 g 0  . loperamide (IMODIUM A-D) 2 MG tablet Take 2 mg by mouth daily as needed for diarrhea or loose stools.    Marland Kitchen LORazepam (ATIVAN) 0.5 MG tablet Take 1 tablet (0.5 mg total) by mouth 2 (two) times daily as needed for anxiety. 30 tablet 0  . Lutein 10 MG TABS Take 10 mg by mouth at bedtime.     . magic mouthwash SOLN Take 5 mLs by mouth 4 (four) times daily as needed for mouth pain. (Patient taking differently: Take 5 mLs by mouth 4 (four) times daily as needed for mouth pain. SWISH AND SPIT OUT) 240 mL 2  . Melatonin 5 MG TABS Take 5 mg by mouth at bedtime.    . Methylcobalamin (METHYL B-12 PO) Take 1 tablet by mouth daily.    . mirtazapine (REMERON) 15 MG tablet Take 15 mg by mouth at bedtime.    . Multiple Vitamin (MULITIVITAMIN WITH MINERALS) TABS Take 1 tablet by mouth daily.    . pantoprazole (PROTONIX) 40 MG tablet Take 40 mg by mouth daily.     . pravastatin (PRAVACHOL) 40 MG tablet Take 40 mg by mouth at bedtime.     . predniSONE (DELTASONE) 5 MG tablet Take 5 mg by mouth daily with breakfast.    . prochlorperazine (COMPAZINE) 10 MG tablet Take 1 tablet (10 mg total) by mouth every 6 (six) hours as needed for nausea or vomiting. 30 tablet 0  . tamsulosin (FLOMAX) 0.4 MG CAPS capsule TAKE (1) CAPSULE DAILY, START 4 DAYS BEFORE PROCEDURE (Patient taking differently: take 1 capsule by mouth every morning) 30 capsule 2  . triamcinolone (NASACORT) 55 MCG/ACT AERO nasal inhaler Place 1 spray into the nose at bedtime.      No current facility-administered medications for this visit.     SURGICAL HISTORY:  Past Surgical History:  Procedure Laterality Date  . COLONOSCOPY    . HEMORRHOID SURGERY N/A 10/01/2013   Procedure: EXAM UNDER ANESTHESIA  AND EXCISIONAL  HEMORRHOIDECTOMY WITH HEMORRHOID BANDING X 2;  Surgeon: Gayland Curry, MD;  Location: [facility];  Service: General;  Laterality: N/A;  . INGUINAL HERNIA REPAIR  01/04/2012   Procedure: LAPAROSCOPIC INGUINAL HERNIA;  Surgeon: Gayland Curry, MD,FACS;  Location: [facility];  Service: General;  Laterality: Right;  . IR FLUORO GUIDE PORT INSERTION RIGHT  01/02/2017  . IR US GUIDE VASC ACCESS RIGHT  01/02/2017  . VIDEO BRONCHOSCOPY WITH ENDOBRONCHIAL ULTRASOUND N/A 12/31/2015   Procedure: VIDEO BRONCHOSCOPY WITH ENDOBRONCHIAL ULTRASOUND transbronchial biopsy of node 10 R lymph node;  Surgeon: Grace Isaac, MD;  Location: [facility];  Service: Thoracic;  Laterality: N/A;    REVIEW OF SYSTEMS:  A comprehensive review of systems was negative except for: Constitutional: positive for fatigue Respiratory: positive for cough and dyspnea on exertion Musculoskeletal: positive for muscle weakness   PHYSICAL EXAMINATION: General appearance: alert, cooperative, fatigued and no distress Head: Normocephalic, without obvious abnormality, atraumatic Neck: no adenopathy, no JVD, supple, symmetrical, trachea midline and thyroid not enlarged, symmetric, no tenderness/mass/nodules Lymph nodes: Cervical, supraclavicular, and axillary nodes normal. Resp: wheezes bilaterally Back: symmetric, no curvature. ROM normal. No CVA tenderness. Cardio: regular rate and rhythm, S1, S2 normal, no murmur, click, rub or gallop GI: soft, non-tender; bowel sounds normal; no masses,  no organomegaly Extremities: extremities normal, atraumatic, no cyanosis or edema  ECOG PERFORMANCE STATUS: 1 - Symptomatic but completely ambulatory  Blood pressure 104/79, pulse 96, temperature 98.1 F (36.7 C), temperature source Oral, resp. rate (!) 23, SpO2 99 %.  LABORATORY DATA: Lab Results  Component Value Date   WBC 9.3 10/02/2017   HGB 9.7 (L) 10/02/2017   HCT 30.1 (L) 10/02/2017   MCV 95.3 10/02/2017   PLT 128 (L) 10/02/2017       Chemistry      Component Value Date/Time   NA 133 (L) 10/02/2017 1017   NA 135 (L) 04/30/2017 0824   K 4.8 10/02/2017 1017   K 4.4 04/30/2017 0824   CL 102 10/02/2017 1017   CO2 19 (L) 10/02/2017 1017   CO2 19 (L) 04/30/2017 0824   BUN 46 (H) 10/02/2017 1017   BUN 28.0 (H) 04/30/2017 0824   CREATININE 1.36 (H) 10/02/2017 1017   CREATININE 1.5 (H) 04/30/2017 0824      Component Value Date/Time   CALCIUM 8.3 (L) 10/02/2017 1017   CALCIUM 7.6 (L) 04/30/2017 0824   ALKPHOS 82 10/02/2017 1017   ALKPHOS 88 04/30/2017 0824   AST 30 10/02/2017 1017   AST 22 04/30/2017 0824   ALT 28 10/02/2017 1017   ALT 19 04/30/2017 0824   BILITOT 0.3 10/02/2017 1017   BILITOT 0.57 04/30/2017 0824       RADIOGRAPHIC STUDIES: Ct Chest W Contrast  Result Date: 09/08/2017 CLINICAL DATA:  Lung cancer restaging. EXAM: CT CHEST, ABDOMEN, AND PELVIS WITH CONTRAST TECHNIQUE: Multidetector CT imaging of the chest, abdomen and pelvis was performed following the standard protocol during bolus administration of intravenous contrast. CONTRAST:  140m OMNIPAQUE IOHEXOL 300 MG/ML  SOLN COMPARISON:  CT AP 05/17/2017, CT a chest 05/09/2017 and CT chest abdomen and pelvis from 03/08/2017. FINDINGS: CT CHEST FINDINGS Cardiovascular: The heart size appears normal. Aortic atherosclerosis noted. No pericardial effusion. Calcification in the LAD and RCA and left circumflex coronary arteries noted. Mediastinum/Nodes: Normal appearance of the thyroid gland. The trachea appears patent and is midline. Normal appearance of the esophagus. No enlarged mediastinal or hilar lymph nodes. Lungs/Pleura: No pleural effusion. Moderate changes of emphysema. Paramediastinal masslike architectural distortion and fibrosis within the right upper lobe, right middle lobe and right lower lobe identified compatible with changes due to external beam radiation. Right middle lobe subpleural nodule measures 7 mm, image 88/6. Unchanged from 05/09/2017.  Pulmonary nodule within the right lower lobe measures 4 mm and is unchanged from 03/08/17. Posterior left lower lobe lung nodule measures 8 mm, image 105/6. Unchanged from previous exam. Musculoskeletal: No suspicious bone lesions. CT ABDOMEN PELVIS FINDINGS Hepatobiliary: No focal liver abnormality. Normal appearance of the gallbladder. No biliary dilatation. Pancreas: Unremarkable. No pancreatic ductal dilatation or surrounding inflammatory changes. Spleen:  Normal in size without focal abnormality. 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 The index lesion within the right adrenal gland measures 2.9 by 2.1 cm, image 62/2. Previously 2.0 x 1.9 cm. The left adrenal gland metastasis measures 3.8 by 2.9 cm, image 66/2. Previously 3.5 by 2.8 cm. Bilateral renal cortical scarring identified. Multiple right renal calculi measure up to 5 mm. Punctate stone within the upper pole of the scratch set small left renal calculi are also stable. Simple appearing cyst arises from the inferior pole of left kidney measuring 1.2 cm. No hydronephrosis identified bilaterally. Urinary bladder appears normal. Stomach/Bowel: Stomach normal. The small bowel loops have a normal course and caliber without obstruction. No pathologic dilatation of the colon. Distal colonic diverticula noted without acute inflammation. Vascular/Lymphatic: Aortic atherosclerosis. No aneurysm. No abdominal or pelvic adenopathy. No inguinal adenopathy. Reproductive: The prostate gland measures 3.6 by 5.5 by 4.5 cm (volume = 47 cm^3). Other: There is no ascites or focal fluid collection. Musculoskeletal: Degenerative disc disease within the lumbar spine. No suspicious bone lesions. IMPRESSION: 1. 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 2. Stable changes of external beam radiation within the right lung. Small peripheral nodules within the right middle lobe, right lower lobe and left lower lobe are unchanged  in the interval. 3. No new sites of disease. 4. Aortic Atherosclerosis (ICD10-I70.0) and Emphysema (ICD10-J43.9). 5. Three vessel coronary artery atherosclerotic calcifications 6. Bilateral nonobstructing renal calculi 7. Mild prostate gland enlargement. Electronically Signed   By: Kerby Moors M.D.   On: 09/08/2017 18:15   Ct Abdomen Pelvis W Contrast  Result Date: 09/08/2017 CLINICAL DATA:  Lung cancer restaging. EXAM: CT CHEST, ABDOMEN, AND PELVIS WITH CONTRAST TECHNIQUE: Multidetector CT imaging of the chest, abdomen and pelvis was performed following the standard protocol during bolus administration of intravenous contrast. CONTRAST:  112m OMNIPAQUE IOHEXOL 300 MG/ML  SOLN COMPARISON:  CT AP 05/17/2017, CT a chest 05/09/2017 and CT chest abdomen and pelvis from 03/08/2017. FINDINGS: CT CHEST FINDINGS Cardiovascular: The heart size appears normal. Aortic atherosclerosis noted. No pericardial effusion. Calcification in the LAD and RCA and left circumflex coronary arteries noted. Mediastinum/Nodes: Normal appearance of the thyroid gland. The trachea appears patent and is midline. Normal appearance of the esophagus. No enlarged mediastinal or hilar lymph nodes. Lungs/Pleura: No pleural effusion. Moderate changes of emphysema. Paramediastinal masslike architectural distortion and fibrosis within the right upper lobe, right middle lobe and right lower lobe identified compatible with changes due to external beam radiation. Right middle lobe subpleural nodule measures 7 mm, image 88/6. Unchanged from 05/09/2017. Pulmonary nodule within the right lower lobe measures 4 mm and is unchanged from 03/08/17. Posterior left lower lobe lung nodule measures 8 mm, image 105/6. Unchanged from previous exam. Musculoskeletal: No suspicious bone lesions. CT ABDOMEN PELVIS FINDINGS Hepatobiliary: No focal liver abnormality. Normal appearance of the gallbladder. No biliary dilatation. Pancreas: Unremarkable. No pancreatic ductal  dilatation or surrounding inflammatory changes. Spleen: Normal in size without focal abnormality. 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 The index lesion within the right adrenal gland measures 2.9 by 2.1 cm, image 62/2. Previously 2.0 x 1.9 cm. The left adrenal gland metastasis measures 3.8 by 2.9 cm, image 66/2. Previously 3.5 by 2.8 cm. Bilateral renal cortical scarring identified. Multiple right renal calculi measure up to 5 mm. Punctate stone within the upper pole of the scratch set small left renal calculi are also stable. Simple appearing cyst arises from the inferior pole of  left kidney measuring 1.2 cm. No hydronephrosis identified bilaterally. Urinary bladder appears normal. Stomach/Bowel: Stomach normal. The small bowel loops have a normal course and caliber without obstruction. No pathologic dilatation of the colon. Distal colonic diverticula noted without acute inflammation. Vascular/Lymphatic: Aortic atherosclerosis. No aneurysm. No abdominal or pelvic adenopathy. No inguinal adenopathy. Reproductive: The prostate gland measures 3.6 by 5.5 by 4.5 cm (volume = 47 cm^3). Other: There is no ascites or focal fluid collection. Musculoskeletal: Degenerative disc disease within the lumbar spine. No suspicious bone lesions. IMPRESSION: 1. 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 2. Stable changes of external beam radiation within the right lung. Small peripheral nodules within the right middle lobe, right lower lobe and left lower lobe are unchanged in the interval. 3. No new sites of disease. 4. Aortic Atherosclerosis (ICD10-I70.0) and Emphysema (ICD10-J43.9). 5. Three vessel coronary artery atherosclerotic calcifications 6. Bilateral nonobstructing renal calculi 7. Mild prostate gland enlargement. Electronically Signed   By: Kerby Moors M.D.   On: 09/08/2017 18:15    ASSESSMENT AND PLAN:  This is a very pleasant 77 years old white male with  unresectable a stage II a non-small cell lung cancer, adenocarcinoma presented with right hilar mass status post a course of concurrent chemoradiation with weekly carboplatin and paclitaxel. He has partial response to this treatment. The patient has been observation for the last few months. The patient developed disease progression and he was started on systemic chemotherapy with carboplatin, Alimta and Avastin status post 6 cycles. The patient tolerated the previous treatment fairly well except for increasing fatigue. The patient is currently undergoing maintenance treatment with Alimta and Avastin status post 4 cycles.   The patient continues to tolerate his treatment well with no concerning complaints. He is also expected to start a stereotactic radiotherapy to the enlarging adrenal gland lesions under the care of Dr. Sondra Come. I recommended for him to proceed with cycle #5 today as a scheduled. For COPD and shortness of breath, I would refer the patient to pulmonary medicine for evaluation and management. I will see him back for follow-up visit in 3 weeks for reevaluation before starting cycle #6. He was advised to call immediately if he has any concerning symptoms in the interval. The patient voices understanding of current disease status and treatment options and is in agreement with the current care plan. All questions were answered. The patient knows to call the clinic with any problems, questions or concerns. We can certainly see the patient much sooner if necessary.  Disclaimer: This note was dictated with voice recognition software. Similar sounding words can inadvertently be transcribed and may not be corrected upon review.

## 2017-10-02 NOTE — Patient Instructions (Signed)
[facility] Discharge Instructions for Patients Receiving Chemotherapy  Today you received the following chemotherapy agents alimta and avastin To help prevent nausea and vomiting after your treatment, we encourage you to take your nausea medication as prescribed.  If you develop nausea and vomiting that is not controlled by your nausea medication, call the clinic.   BELOW ARE SYMPTOMS THAT SHOULD BE REPORTED IMMEDIATELY:  *FEVER GREATER THAN 100.5 F  *CHILLS WITH OR WITHOUT FEVER  NAUSEA AND VOMITING THAT IS NOT CONTROLLED WITH YOUR NAUSEA MEDICATION  *UNUSUAL SHORTNESS OF BREATH  *UNUSUAL BRUISING OR BLEEDING  TENDERNESS IN MOUTH AND THROAT WITH OR WITHOUT PRESENCE OF ULCERS  *URINARY PROBLEMS  *BOWEL PROBLEMS  UNUSUAL RASH Items with * indicate a potential emergency and should be followed up as soon as possible.  Feel free to call the clinic should you have any questions or concerns. The clinic phone number is [phone].  Please show the [facility] at check-in to the Emergency Department and triage nurse.

## 2017-10-03 ENCOUNTER — Ambulatory Visit
Admission: RE | Admit: 2017-10-03 | Discharge: 2017-10-03 | Disposition: A | Discharge status: Home / Self Care | Payer: Medicare Other | Source: Ambulatory Visit | Attending: Radiation Oncology | Admitting: Radiation Oncology

## 2017-10-03 DIAGNOSIS — C3491 Malignant neoplasm of unspecified part of right bronchus or lung: Secondary | ICD-10-CM

## 2017-10-03 DIAGNOSIS — Z510 Encounter for antineoplastic radiation therapy: Secondary | ICD-10-CM | POA: Diagnosis not present

## 2017-10-03 NOTE — Progress Notes (Signed)
  Radiation Oncology         [phone] ________________________________  Name: Mark Howell MRN: [id number]  Date: 10/03/2017  DOB: [birth year]  Stereotactic Body Radiotherapy Treatment Procedure Note  NARRATIVE:  Mark Howell was brought to the stereotactic radiation treatment machine and placed supine on the CT couch. The patient was set up for stereotactic body radiotherapy on the body fix pillow.  3D TREATMENT PLANNING AND DOSIMETRY:  The patient's radiation plan was reviewed and approved prior to starting treatment.  It showed 3-dimensional radiation distributions overlaid onto the planning CT.  The [facility] for the target structures as well as the organs at risk were reviewed. The documentation of this is filed in the radiation oncology EMR.  SIMULATION VERIFICATION:  The patient underwent CT imaging on the treatment unit.  These were carefully aligned to document that the ablative radiation dose would cover the target volume and maximally spare the nearby organs at risk according to the planned distribution.  SPECIAL TREATMENT PROCEDURE: Mark Howell received high dose ablative stereotactic body radiotherapy to the planned target volume without unforeseen complications. Treatment was delivered uneventfully. The high doses associated with stereotactic body radiotherapy and the significant potential risks require careful treatment set up and patient monitoring constituting a special treatment procedure   STEREOTACTIC TREATMENT MANAGEMENT:  Following delivery, the patient was evaluated clinically. The patient tolerated treatment without significant acute effects, and was discharged to home in stable condition.    PLAN: Continue treatment as planned.  ________________________________  Blair Promise, PhD, MD

## 2017-10-05 ENCOUNTER — Other Ambulatory Visit: Payer: Self-pay | Admitting: Internal Medicine

## 2017-10-05 ENCOUNTER — Ambulatory Visit
Admission: RE | Admit: 2017-10-05 | Discharge: 2017-10-05 | Disposition: A | Discharge status: Home / Self Care | Payer: Medicare Other | Source: Ambulatory Visit | Attending: Radiation Oncology | Admitting: Radiation Oncology

## 2017-10-05 DIAGNOSIS — Z510 Encounter for antineoplastic radiation therapy: Secondary | ICD-10-CM | POA: Diagnosis not present

## 2017-10-08 ENCOUNTER — Ambulatory Visit: Payer: Medicare Other | Admitting: Radiation Oncology

## 2017-10-09 ENCOUNTER — Ambulatory Visit
Admission: RE | Admit: 2017-10-09 | Discharge: 2017-10-09 | Disposition: A | Discharge status: Home / Self Care | Payer: Medicare Other | Source: Ambulatory Visit | Attending: Radiation Oncology | Admitting: Radiation Oncology

## 2017-10-09 DIAGNOSIS — Z510 Encounter for antineoplastic radiation therapy: Secondary | ICD-10-CM | POA: Diagnosis not present

## 2017-10-09 DIAGNOSIS — C7970 Secondary malignant neoplasm of unspecified adrenal gland: Secondary | ICD-10-CM

## 2017-10-09 NOTE — Progress Notes (Signed)
  Radiation Oncology         [phone] ________________________________  Name: Mark Howell MRN: [id number]  Date: 10/09/2017  DOB: 04-15-1941  Stereotactic Body Radiotherapy Treatment Procedure Note  NARRATIVE:  Mark Howell was brought to the stereotactic radiation treatment machine and placed supine on the CT couch. The patient was set up for stereotactic body radiotherapy on the body fix pillow.  3D TREATMENT PLANNING AND DOSIMETRY:  The patient's radiation plan was reviewed and approved prior to starting treatment.  It showed 3-dimensional radiation distributions overlaid onto the planning CT.  The [facility] for the target structures as well as the organs at risk were reviewed. The documentation of this is filed in the radiation oncology EMR.  SIMULATION VERIFICATION:  The patient underwent CT imaging on the treatment unit.  These were carefully aligned to document that the ablative radiation dose would cover the target volume and maximally spare the nearby organs at risk according to the planned distribution.  SPECIAL TREATMENT PROCEDURE: Mark Howell received high dose ablative stereotactic body radiotherapy to the planned target volume without unforeseen complications. Treatment was delivered uneventfully. The high doses associated with stereotactic body radiotherapy and the significant potential risks require careful treatment set up and patient monitoring constituting a special treatment procedure   STEREOTACTIC TREATMENT MANAGEMENT:  Following delivery, the patient was evaluated clinically. The patient tolerated treatment without significant acute effects, and was discharged to home in stable condition.    PLAN: Continue treatment as planned.  ________________________________  Blair Promise, PhD, MD

## 2017-10-10 ENCOUNTER — Ambulatory Visit: Payer: Medicare Other | Admitting: Radiation Oncology

## 2017-10-11 ENCOUNTER — Other Ambulatory Visit: Payer: Self-pay | Admitting: Internal Medicine

## 2017-10-11 ENCOUNTER — Encounter: Payer: Self-pay | Admitting: Radiation Oncology

## 2017-10-11 ENCOUNTER — Ambulatory Visit
Admission: RE | Admit: 2017-10-11 | Discharge: 2017-10-11 | Disposition: A | Discharge status: Home / Self Care | Payer: Medicare Other | Source: Ambulatory Visit | Attending: Radiation Oncology | Admitting: Radiation Oncology

## 2017-10-11 DIAGNOSIS — Z510 Encounter for antineoplastic radiation therapy: Secondary | ICD-10-CM | POA: Diagnosis not present

## 2017-10-11 DIAGNOSIS — C7971 Secondary malignant neoplasm of right adrenal gland: Secondary | ICD-10-CM

## 2017-10-11 NOTE — Progress Notes (Signed)
  Radiation Oncology         [phone] ________________________________  Name: Mark Howell MRN: [id number]  Date: 10/11/2017  DOB: 1941/01/20  Stereotactic Body Radiotherapy Treatment Procedure Note  NARRATIVE:  Mark Howell was brought to the stereotactic radiation treatment machine and placed supine on the CT couch. The patient was set up for stereotactic body radiotherapy on the body fix pillow.  3D TREATMENT PLANNING AND DOSIMETRY:  The patient's radiation plan was reviewed and approved prior to starting treatment.  It showed 3-dimensional radiation distributions overlaid onto the planning CT.  The [facility] for the target structures as well as the organs at risk were reviewed. The documentation of this is filed in the radiation oncology EMR.  SIMULATION VERIFICATION:  The patient underwent CT imaging on the treatment unit.  These were carefully aligned to document that the ablative radiation dose would cover the target volume and maximally spare the nearby organs at risk according to the planned distribution.  SPECIAL TREATMENT PROCEDURE: Mark Howell received high dose ablative stereotactic body radiotherapy to the planned target volume without unforeseen complications. Treatment was delivered uneventfully. The high doses associated with stereotactic body radiotherapy and the significant potential risks require careful treatment set up and patient monitoring constituting a special treatment procedure   STEREOTACTIC TREATMENT MANAGEMENT:  Following delivery, the patient was evaluated clinically. The patient tolerated treatment without significant acute effects, and was discharged to home in stable condition.    PLAN: Continue treatment as planned.  ________________________________  Blair Promise, PhD, MD

## 2017-10-15 ENCOUNTER — Observation Stay (HOSPITAL_COMMUNITY)
Admission: EM | Admit: 2017-10-15 | Discharge: 2017-10-17 | Disposition: A | Discharge status: Home / Self Care | Payer: Medicare Other | Attending: Internal Medicine | Admitting: Internal Medicine

## 2017-10-15 ENCOUNTER — Encounter (HOSPITAL_COMMUNITY): Payer: Self-pay

## 2017-10-15 ENCOUNTER — Inpatient Hospital Stay (HOSPITAL_BASED_OUTPATIENT_CLINIC_OR_DEPARTMENT_OTHER): Payer: Medicare Other | Admitting: Medical

## 2017-10-15 ENCOUNTER — Emergency Department (HOSPITAL_COMMUNITY): Payer: Medicare Other

## 2017-10-15 ENCOUNTER — Other Ambulatory Visit: Payer: Self-pay

## 2017-10-15 ENCOUNTER — Inpatient Hospital Stay: Payer: Medicare Other

## 2017-10-15 ENCOUNTER — Other Ambulatory Visit: Payer: Self-pay | Admitting: Medical

## 2017-10-15 VITALS — BP 112/79 | HR 117 | Temp 97.5°F | Resp 18 | Ht 73.5 in

## 2017-10-15 DIAGNOSIS — G9341 Metabolic encephalopathy: Secondary | ICD-10-CM | POA: Diagnosis not present

## 2017-10-15 DIAGNOSIS — R079 Chest pain, unspecified: Secondary | ICD-10-CM | POA: Insufficient documentation

## 2017-10-15 DIAGNOSIS — Z5111 Encounter for antineoplastic chemotherapy: Secondary | ICD-10-CM

## 2017-10-15 DIAGNOSIS — Z833 Family history of diabetes mellitus: Secondary | ICD-10-CM | POA: Insufficient documentation

## 2017-10-15 DIAGNOSIS — Z7984 Long term (current) use of oral hypoglycemic drugs: Secondary | ICD-10-CM | POA: Insufficient documentation

## 2017-10-15 DIAGNOSIS — K219 Gastro-esophageal reflux disease without esophagitis: Secondary | ICD-10-CM | POA: Insufficient documentation

## 2017-10-15 DIAGNOSIS — R05 Cough: Secondary | ICD-10-CM | POA: Insufficient documentation

## 2017-10-15 DIAGNOSIS — R000 Tachycardia, unspecified: Secondary | ICD-10-CM | POA: Insufficient documentation

## 2017-10-15 DIAGNOSIS — R350 Frequency of micturition: Secondary | ICD-10-CM | POA: Insufficient documentation

## 2017-10-15 DIAGNOSIS — C7970 Secondary malignant neoplasm of unspecified adrenal gland: Secondary | ICD-10-CM | POA: Insufficient documentation

## 2017-10-15 DIAGNOSIS — C3491 Malignant neoplasm of unspecified part of right bronchus or lung: Secondary | ICD-10-CM

## 2017-10-15 DIAGNOSIS — N179 Acute kidney failure, unspecified: Secondary | ICD-10-CM | POA: Diagnosis not present

## 2017-10-15 DIAGNOSIS — D696 Thrombocytopenia, unspecified: Secondary | ICD-10-CM | POA: Diagnosis not present

## 2017-10-15 DIAGNOSIS — N401 Enlarged prostate with lower urinary tract symptoms: Secondary | ICD-10-CM | POA: Insufficient documentation

## 2017-10-15 DIAGNOSIS — Z87891 Personal history of nicotine dependence: Secondary | ICD-10-CM | POA: Insufficient documentation

## 2017-10-15 DIAGNOSIS — R5383 Other fatigue: Secondary | ICD-10-CM | POA: Insufficient documentation

## 2017-10-15 DIAGNOSIS — R319 Hematuria, unspecified: Secondary | ICD-10-CM | POA: Diagnosis not present

## 2017-10-15 DIAGNOSIS — E860 Dehydration: Secondary | ICD-10-CM | POA: Diagnosis not present

## 2017-10-15 DIAGNOSIS — R197 Diarrhea, unspecified: Secondary | ICD-10-CM | POA: Diagnosis not present

## 2017-10-15 DIAGNOSIS — J45909 Unspecified asthma, uncomplicated: Secondary | ICD-10-CM | POA: Insufficient documentation

## 2017-10-15 DIAGNOSIS — F419 Anxiety disorder, unspecified: Secondary | ICD-10-CM | POA: Insufficient documentation

## 2017-10-15 DIAGNOSIS — Z7952 Long term (current) use of systemic steroids: Secondary | ICD-10-CM | POA: Insufficient documentation

## 2017-10-15 DIAGNOSIS — Z800 Family history of malignant neoplasm of digestive organs: Secondary | ICD-10-CM | POA: Insufficient documentation

## 2017-10-15 DIAGNOSIS — R110 Nausea: Secondary | ICD-10-CM | POA: Insufficient documentation

## 2017-10-15 DIAGNOSIS — Z79899 Other long term (current) drug therapy: Secondary | ICD-10-CM | POA: Insufficient documentation

## 2017-10-15 DIAGNOSIS — E785 Hyperlipidemia, unspecified: Secondary | ICD-10-CM | POA: Diagnosis not present

## 2017-10-15 DIAGNOSIS — N183 Chronic kidney disease, stage 3 unspecified: Secondary | ICD-10-CM

## 2017-10-15 DIAGNOSIS — R0602 Shortness of breath: Secondary | ICD-10-CM

## 2017-10-15 DIAGNOSIS — Z923 Personal history of irradiation: Secondary | ICD-10-CM | POA: Diagnosis not present

## 2017-10-15 DIAGNOSIS — D649 Anemia, unspecified: Secondary | ICD-10-CM | POA: Diagnosis not present

## 2017-10-15 DIAGNOSIS — Z885 Allergy status to narcotic agent status: Secondary | ICD-10-CM | POA: Insufficient documentation

## 2017-10-15 DIAGNOSIS — R531 Weakness: Secondary | ICD-10-CM

## 2017-10-15 DIAGNOSIS — C3401 Malignant neoplasm of right main bronchus: Secondary | ICD-10-CM | POA: Diagnosis not present

## 2017-10-15 DIAGNOSIS — E1122 Type 2 diabetes mellitus with diabetic chronic kidney disease: Secondary | ICD-10-CM | POA: Insufficient documentation

## 2017-10-15 DIAGNOSIS — Z7982 Long term (current) use of aspirin: Secondary | ICD-10-CM | POA: Diagnosis not present

## 2017-10-15 DIAGNOSIS — C7971 Secondary malignant neoplasm of right adrenal gland: Secondary | ICD-10-CM

## 2017-10-15 DIAGNOSIS — Z5112 Encounter for antineoplastic immunotherapy: Secondary | ICD-10-CM | POA: Diagnosis not present

## 2017-10-15 LAB — CMP (CANCER CENTER ONLY)
ALT: 21 U/L (ref 0–55)
ANION GAP: 14 — AB (ref 3–11)
AST: 33 U/L (ref 5–34)
Albumin: 3.6 g/dL (ref 3.5–5.0)
Alkaline Phosphatase: 105 U/L (ref 40–150)
BUN: 65 mg/dL — ABNORMAL HIGH (ref 7–26)
CHLORIDE: 98 mmol/L (ref 98–109)
CO2: 22 mmol/L (ref 22–29)
Calcium: 9.9 mg/dL (ref 8.4–10.4)
Creatinine: 2.21 mg/dL — ABNORMAL HIGH (ref 0.70–1.30)
GFR, EST NON AFRICAN AMERICAN: 27 mL/min — AB (ref >60)
GFR, Est AFR Am: 31 mL/min — ABNORMAL LOW (ref >60)
Glucose, Bld: 232 mg/dL — ABNORMAL HIGH (ref 70–140)
POTASSIUM: 4.9 mmol/L (ref 3.5–5.1)
SODIUM: 134 mmol/L — AB (ref 136–145)
Total Bilirubin: 0.4 mg/dL (ref 0.2–1.2)
Total Protein: 7.8 g/dL (ref 6.4–8.3)

## 2017-10-15 LAB — CBC WITH DIFFERENTIAL (CANCER CENTER ONLY)
Basophils Absolute: 0 10*3/uL (ref 0.0–0.1)
Basophils Relative: 0 %
EOS ABS: 0 10*3/uL (ref 0.0–0.5)
Eosinophils Relative: 1 %
HEMATOCRIT: 35.2 % — AB (ref 38.4–49.9)
HEMOGLOBIN: 11.7 g/dL — AB (ref 13.0–17.1)
LYMPHS ABS: 0.2 10*3/uL — AB (ref 0.9–3.3)
Lymphocytes Relative: 3 %
MCH: 31.5 pg (ref 27.2–33.4)
MCHC: 33.2 g/dL (ref 32.0–36.0)
MCV: 94.7 fL (ref 79.3–98.0)
Monocytes Absolute: 1.5 10*3/uL — ABNORMAL HIGH (ref 0.1–0.9)
Monocytes Relative: 22 %
NEUTROS ABS: 5.3 10*3/uL (ref 1.5–6.5)
NEUTROS PCT: 74 %
Platelet Count: 82 10*3/uL — ABNORMAL LOW (ref 140–400)
RBC: 3.72 MIL/uL — ABNORMAL LOW (ref 4.20–5.82)
RDW: 23 % — ABNORMAL HIGH (ref 11.0–14.6)
WBC: 7.1 10*3/uL (ref 4.0–10.3)

## 2017-10-15 LAB — MAGNESIUM: MAGNESIUM: 1.1 mg/dL — AB (ref 1.7–2.4)

## 2017-10-15 LAB — GLUCOSE, CAPILLARY: Glucose-Capillary: 136 mg/dL — ABNORMAL HIGH (ref 65–99)

## 2017-10-15 MED ORDER — ONDANSETRON HCL 4 MG PO TABS: 4.0000 mg | ORAL_TABLET | Freq: Four times a day (QID) | ORAL | Status: DC | PRN

## 2017-10-15 MED ORDER — INSULIN ASPART 100 UNIT/ML ~~LOC~~ SOLN
0.0000 [IU] | Freq: Three times a day (TID) | SUBCUTANEOUS | Status: DC
  Administered 2017-10-16: 100 [IU] via SUBCUTANEOUS
  Administered 2017-10-16: 1 [IU] via SUBCUTANEOUS

## 2017-10-15 MED ORDER — LORAZEPAM 0.5 MG PO TABS: 0.5000 mg | ORAL_TABLET | Freq: Two times a day (BID) | ORAL | Status: DC | PRN

## 2017-10-15 MED ORDER — FOLIC ACID 1 MG PO TABS
1.0000 mg | ORAL_TABLET | Freq: Every day | ORAL | Status: DC
  Administered 2017-10-16: 1 mg via ORAL
  Filled 2017-10-15: qty 1

## 2017-10-15 MED ORDER — ONDANSETRON HCL 4 MG/2ML IJ SOLN: 4.0000 mg | Freq: Four times a day (QID) | INTRAMUSCULAR | Status: DC | PRN

## 2017-10-15 MED ORDER — PROSIGHT PO TABS
1.0000 | ORAL_TABLET | Freq: Every day | ORAL | Status: DC
  Administered 2017-10-15 – 2017-10-16 (×2): 1 via ORAL
  Filled 2017-10-15 (×2): qty 1

## 2017-10-15 MED ORDER — MOMETASONE FURO-FORMOTEROL FUM 200-5 MCG/ACT IN AERO
2.0000 | INHALATION_SPRAY | Freq: Two times a day (BID) | RESPIRATORY_TRACT | Status: DC
  Administered 2017-10-15 – 2017-10-17 (×4): 2 via RESPIRATORY_TRACT
  Filled 2017-10-15: qty 8.8

## 2017-10-15 MED ORDER — ACETAMINOPHEN 650 MG RE SUPP: 650.0000 mg | Freq: Four times a day (QID) | RECTAL | Status: DC | PRN

## 2017-10-15 MED ORDER — INSULIN ASPART 100 UNIT/ML ~~LOC~~ SOLN
3.0000 [IU] | Freq: Three times a day (TID) | SUBCUTANEOUS | Status: DC
  Administered 2017-10-16: 3 [IU] via SUBCUTANEOUS
  Administered 2017-10-16 – 2017-10-17 (×2): 100 [IU] via SUBCUTANEOUS

## 2017-10-15 MED ORDER — DIPHENOXYLATE-ATROPINE 2.5-0.025 MG PO TABS
2.0000 | ORAL_TABLET | Freq: Four times a day (QID) | ORAL | Status: DC | PRN
  Administered 2017-10-15 – 2017-10-16 (×2): 2 via ORAL
  Filled 2017-10-15 (×2): qty 2

## 2017-10-15 MED ORDER — PANTOPRAZOLE SODIUM 40 MG PO TBEC
40.0000 mg | DELAYED_RELEASE_TABLET | Freq: Every day | ORAL | Status: DC
  Administered 2017-10-16: 40 mg via ORAL
  Filled 2017-10-15: qty 1

## 2017-10-15 MED ORDER — PREDNISONE 10 MG PO TABS
10.0000 mg | ORAL_TABLET | Freq: Every day | ORAL | Status: DC
  Administered 2017-10-16 – 2017-10-17 (×2): 10 mg via ORAL
  Filled 2017-10-15 (×2): qty 1

## 2017-10-15 MED ORDER — SODIUM CHLORIDE 0.9% FLUSH: 3.0000 mL | INTRAVENOUS | Status: DC | PRN

## 2017-10-15 MED ORDER — PRAVASTATIN SODIUM 40 MG PO TABS
40.0000 mg | ORAL_TABLET | Freq: Every day | ORAL | Status: DC
  Administered 2017-10-15 – 2017-10-16 (×2): 40 mg via ORAL
  Filled 2017-10-15 (×2): qty 1

## 2017-10-15 MED ORDER — NYSTATIN 100000 UNIT/GM EX POWD
Freq: Three times a day (TID) | CUTANEOUS | Status: DC
  Administered 2017-10-15 – 2017-10-16 (×4): via TOPICAL
  Filled 2017-10-15: qty 15

## 2017-10-15 MED ORDER — ACETAMINOPHEN 325 MG PO TABS: 650.0000 mg | ORAL_TABLET | Freq: Four times a day (QID) | ORAL | Status: DC | PRN

## 2017-10-15 MED ORDER — MELATONIN 5 MG PO TABS
5.0000 mg | ORAL_TABLET | Freq: Every day | ORAL | Status: DC
  Administered 2017-10-15 – 2017-10-16 (×2): 5 mg via ORAL
  Filled 2017-10-15 (×2): qty 1

## 2017-10-15 MED ORDER — ASPIRIN EC 81 MG PO TBEC
81.0000 mg | DELAYED_RELEASE_TABLET | Freq: Every day | ORAL | Status: DC
  Administered 2017-10-15 – 2017-10-16 (×2): 81 mg via ORAL
  Filled 2017-10-15 (×2): qty 1

## 2017-10-15 MED ORDER — POLYETHYLENE GLYCOL 3350 17 G PO PACK: 17.0000 g | PACK | Freq: Every day | ORAL | Status: DC | PRN

## 2017-10-15 MED ORDER — ALBUTEROL SULFATE (2.5 MG/3ML) 0.083% IN NEBU: 2.5000 mg | INHALATION_SOLUTION | Freq: Four times a day (QID) | RESPIRATORY_TRACT | Status: DC | PRN

## 2017-10-15 MED ORDER — SODIUM CHLORIDE 0.9 % IV SOLN
Freq: Once | INTRAVENOUS | Status: AC
  Administered 2017-10-15: 16:00:00 via INTRAVENOUS
  Filled 2017-10-15: qty 1000

## 2017-10-15 MED ORDER — ENSURE ENLIVE PO LIQD: 237.0000 mL | Freq: Two times a day (BID) | ORAL | Status: DC

## 2017-10-15 MED ORDER — SODIUM CHLORIDE 0.9 % IV SOLN
250.0000 mL | INTRAVENOUS | Status: DC | PRN
  Administered 2017-10-17: 250 mL via INTRAVENOUS

## 2017-10-15 MED ORDER — SODIUM CHLORIDE 0.9 % IV SOLN
INTRAVENOUS | Status: AC
  Administered 2017-10-15 – 2017-10-16 (×2): via INTRAVENOUS

## 2017-10-15 MED ORDER — TAMSULOSIN HCL 0.4 MG PO CAPS
0.4000 mg | ORAL_CAPSULE | Freq: Every morning | ORAL | Status: DC
  Administered 2017-10-16: 0.4 mg via ORAL
  Filled 2017-10-15: qty 1

## 2017-10-15 MED ORDER — INSULIN ASPART 100 UNIT/ML ~~LOC~~ SOLN: 0.0000 [IU] | Freq: Every day | SUBCUTANEOUS | Status: DC

## 2017-10-15 MED ORDER — LUTEIN 10 MG PO TABS: 10.0000 mg | ORAL_TABLET | Freq: Every day | ORAL | Status: DC

## 2017-10-15 MED ORDER — SODIUM CHLORIDE 0.9% FLUSH
3.0000 mL | Freq: Two times a day (BID) | INTRAVENOUS | Status: DC
  Administered 2017-10-15 – 2017-10-16 (×2): 3 mL via INTRAVENOUS

## 2017-10-15 MED ORDER — SODIUM CHLORIDE 0.9% FLUSH: 10.0000 mL | INTRAVENOUS | Status: DC | PRN

## 2017-10-15 MED ORDER — MIRTAZAPINE 15 MG PO TABS
30.0000 mg | ORAL_TABLET | Freq: Every day | ORAL | Status: DC
  Administered 2017-10-15 – 2017-10-16 (×2): 30 mg via ORAL
  Filled 2017-10-15 (×2): qty 2

## 2017-10-15 MED ORDER — CALCIUM CARBONATE ANTACID 500 MG PO CHEW
1.0000 | CHEWABLE_TABLET | Freq: Every day | ORAL | Status: DC
  Administered 2017-10-16 – 2017-10-17 (×2): 200 mg via ORAL
  Filled 2017-10-15 (×2): qty 1

## 2017-10-15 NOTE — ED Triage Notes (Signed)
Patient brought over by [facility], with complaints of nausea/diarrhea x1 week. Patient had his labs checked in the cancer center and was found to have critically low magnesium as well as being dehydrated. [facility] staff accessed patient port and started a drip of 4gm of Magnesium mixed with 1LNS to be administered at 241ml/hr x4 hours. Patient accompanied by family.

## 2017-10-15 NOTE — ED Notes (Signed)
ED TO INPATIENT HANDOFF REPORT  Name/Age/Gender Mark Howell 77 y.o. male  Code Status Code Status History    Date Active Date Inactive Code Status Order ID Comments User Context   06/06/2017 2135 06/12/2017 1833 Full Code [id number]  Florencia Reasons, MD Inpatient   05/09/2017 1809 05/12/2017 1408 Full Code [id number]  Florencia Reasons, MD ED      Home/SNF/Other Home  Chief Complaint abnormal lab  Level of Care/Admitting Diagnosis ED Disposition    ED Disposition Condition [place]: [facility] [[id number]]  Level of Care: Med-Surg [16]  Diagnosis: AKI (acute kidney injury) ([place]) [267124]  Admitting Physician: Charlynne Cousins [[id number]]  Attending Physician: Charlynne Cousins [[id number]]  PT Class (Do Not Modify): Observation [104]  PT Acc Code (Do Not Modify): Observation [10022]       Medical History Past Medical History:  Diagnosis Date  . Arthritis   . Complication of anesthesia    problems voiding post op  . Diabetes mellitus without complication ([place])   . Full dentures   . GERD (gastroesophageal reflux disease)   . History of radiation therapy 01/25/16-02/28/16   right lung 45 Gy  . History of radiation therapy 04/17/16-05/02/16   right lung boost 20 Gy in 10 fractions cumulative dose 65 gray  . HOH (hard of hearing)   . Hyperlipidemia   . lung ca dx'd 01/2016  . Lung mass 12/30/2015  . Odynophagia 04/06/2016  . Psoriatic arthritis ([place])   . Snores   . Wears glasses     Allergies Allergies  Allergen Reactions  . Hydrocodone Rash  . Oxycodone Rash    IV Location/Drains/Wounds Patient Lines/Drains/Airways Status   Active Line/Drains/Airways    Name:   Placement date:   Placement time:   Site:   Days:   Implanted Port Right Chest   -    -    Chest             Labs/Imaging Results for orders placed or performed in visit on 10/15/17 (from the past 48 hour(s))  Magnesium     Status: Abnormal   Collection Time: 10/15/17  1:20 PM   Result Value Ref Range   Magnesium 1.1 (LL) 1.7 - 2.4 mg/dL    Comment: Performed at [facility], [address], [place], [place] [address] CRITICAL RESULT CALLED TO, READ BACK BY AND VERIFIED WITH: Amelia Jo, RN [phone]     [place] ([place] only)     Status: Abnormal   Collection Time: 10/15/17  1:20 PM  Result Value Ref Range   Sodium 134 (L) 136 - 145 mmol/L   Potassium 4.9 3.5 - 5.1 mmol/L   Chloride 98 98 - 109 mmol/L   CO2 22 22 - 29 mmol/L   Glucose, Bld 232 (H) 70 - 140 mg/dL   BUN 65 (H) 7 - 26 mg/dL   Creatinine 2.21 (H) 0.70 - 1.30 mg/dL   Calcium 9.9 8.4 - 10.4 mg/dL   Total Protein 7.8 6.4 - 8.3 g/dL   Albumin 3.6 3.5 - 5.0 g/dL   AST 33 5 - 34 U/L   ALT 21 0 - 55 U/L   Alkaline Phosphatase 105 40 - 150 U/L   Total Bilirubin 0.4 0.2 - 1.2 mg/dL   GFR, Est Non Af Am 27 (L) >60 mL/min   GFR, Est AFR Am 31 (L) >60 mL/min    Comment: (NOTE) The eGFR has been calculated  using the CKD EPI equation. This calculation has not been validated in all clinical situations. eGFR's persistently <60 mL/min signify possible Chronic Kidney Disease.    Anion gap 14 (H) 3 - 11    Comment: Performed at [facility], [place] [address] [place], [place] [address]  CBC with Differential ([place] Only)     Status: Abnormal   Collection Time: 10/15/17  1:20 PM  Result Value Ref Range   WBC Count 7.1 4.0 - 10.3 K/uL   RBC 3.72 (L) 4.20 - 5.82 MIL/uL   Hemoglobin 11.7 (L) 13.0 - 17.1 g/dL   HCT 35.2 (L) 38.4 - 49.9 %   MCV 94.7 79.3 - 98.0 fL   MCH 31.5 27.2 - 33.4 pg   MCHC 33.2 32.0 - 36.0 g/dL   RDW 23.0 (H) 11.0 - 14.6 %   Platelet Count 82 (L) 140 - 400 K/uL   Neutrophils Relative % 74 %   Neutro Abs 5.3 1.5 - 6.5 K/uL   Lymphocytes Relative 3 %   Lymphs Abs 0.2 (L) 0.9 - 3.3 K/uL   Monocytes Relative 22 %   Monocytes Absolute 1.5 (H) 0.1 - 0.9 K/uL   Eosinophils Relative 1 %   Eosinophils Absolute 0.0 0.0 - 0.5 K/uL    Basophils Relative 0 %   Basophils Absolute 0.0 0.0 - 0.1 K/uL    Comment: Performed at [facility], [address], [place], [place] [address]   Dg Chest 2 View  Result Date: 10/15/2017 CLINICAL DATA:  77 year old male with nausea and diarrhea for 1 week. Dehydration. Undergoing lung cancer treatment. EXAM: CHEST - 2 VIEW COMPARISON:  Staging CT chest abdomen pelvis 09/07/2017, and earlier. FINDINGS: Semi upright AP and lateral views of the chest. Stable right chest porta cath, accessed. Radiographically regressed right hilar region mass since February. Other mediastinal contours remain stable. No pneumothorax, pulmonary edema or pleural effusion. Asymmetric streaky and reticular opacity in the left lower lung appears stable. No new pulmonary opacity identified. No acute osseous abnormality identified. Negative visible bowel gas pattern. No pneumoperitoneum along the diaphragm. IMPRESSION: 1. Radiographic regression of right hilar/lung mass since February. Underlying chronic lung disease. 2. No new cardiopulmonary abnormality. Electronically Signed   By: Genevie Ann M.D.   On: 10/15/2017 17:39    Pending Labs Unresulted Labs (From admission, onward)   Start     Ordered   10/15/17 1705  Urinalysis, Routine w reflex microscopic  STAT,   STAT     10/15/17 1704      Vitals/Pain Today's Vitals   10/15/17 1621 10/15/17 1630 10/15/17 1645 10/15/17 1700  BP: 110/83 116/79 111/84 113/79  Pulse: (!) 109 (!) 107 (!) 108 (!) 106  Resp: 12 (!) '30 20 18  '$ Temp: 98.9 F (37.2 C)     TempSrc: Oral     SpO2: 98% 100% 92% 97%    Isolation Precautions No active isolations  Medications Medications  0.9 %  sodium chloride infusion (has no administration in time range)    Mobility walks with person assist

## 2017-10-15 NOTE — ED Notes (Signed)
Bed: WA21 Expected date:  Expected time:  Means of arrival:  Comments: Cancer center patient-low magnesium

## 2017-10-15 NOTE — ED Provider Notes (Signed)
[facility] DEPT Provider Note   CSN: [id number] Arrival date & time: 10/15/17  1612     History   Chief Complaint Chief Complaint  Patient presents with  . Abnormal Lab    HPI Mark Howell is a 77 y.o. male who presents for evaluation of abnormal labs. 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 He has baseline fatigue/weakness from treatments. He has been having nausea and diarrhea for the past week. Was at the Cancer center today and he had a critically low Mag. He was given '4mg'$  of Mg and 1L NS and was transferred to the ED. He states that he just feels so weak and it's been worse in the past week. He's had a possible fever, cough, SOB, wheezing. He is supposed to see pulmonology for this issue. He's has some chest pain over the bilateral lower rib cage which hurts when he coughs. It feels like a burning. He's also had nausea and diarrhea along with urinary frequency. He denies syncope, constant chest pain or SOB, abdominal pain, vomiting.    HPI  Past Medical History:  Diagnosis Date  . Arthritis   . Complication of anesthesia    problems voiding post op  . Diabetes mellitus without complication ([place])   . Full dentures   . GERD (gastroesophageal reflux disease)   . History of radiation therapy 01/25/16-02/28/16   right lung 45 Gy  . History of radiation therapy 04/17/16-05/02/16   right lung boost 20 Gy in 10 fractions cumulative dose 65 gray  . HOH (hard of hearing)   . Hyperlipidemia   . lung ca dx'd 01/2016  . Lung mass 12/30/2015  . Odynophagia 04/06/2016  . Psoriatic arthritis ([place])   . Snores   . Wears glasses     Patient Active Problem List   Diagnosis Date Noted  . 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 Dyspnea 09/11/2017  . Dehydration 06/06/2017  . Sepsis ([place]) 05/09/2017  . Anxiety  04/11/2017  . Port-A-Cath in place 02/19/2017  . Adenocarcinoma of right lung, stage 4 ([place]) 12/27/2016  . Goals of care, counseling/discussion 12/27/2016  . Odynophagia 04/06/2016  . Antineoplastic chemotherapy induced anemia 04/06/2016  . 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 Encounter for antineoplastic chemotherapy 01/13/2016  . Snoring 03/19/2012    Past Surgical History:  Procedure Laterality Date  . COLONOSCOPY    . HEMORRHOID SURGERY N/A 10/01/2013   Procedure: EXAM UNDER ANESTHESIA  AND EXCISIONAL HEMORRHOIDECTOMY WITH HEMORRHOID BANDING X 2;  Surgeon: Gayland Curry, MD;  Location: [facility];  Service: General;  Laterality: N/A;  . INGUINAL HERNIA REPAIR  01/04/2012   Procedure: LAPAROSCOPIC INGUINAL HERNIA;  Surgeon: Gayland Curry, MD,FACS;  Location: [facility];  Service: General;  Laterality: Right;  . IR FLUORO GUIDE PORT INSERTION RIGHT  01/02/2017  . IR US GUIDE VASC ACCESS RIGHT  01/02/2017  . VIDEO BRONCHOSCOPY WITH ENDOBRONCHIAL ULTRASOUND N/A 12/31/2015   Procedure: VIDEO BRONCHOSCOPY WITH ENDOBRONCHIAL ULTRASOUND transbronchial biopsy of node 10 R lymph node;  Surgeon: Grace Isaac, MD;  Location: [facility];  Service: Thoracic;  Laterality: N/A;        Home Medications    Prior to Admission medications   Medication Sig Start Date End Date Taking? Authorizing Provider  albuterol (VENTOLIN HFA) 108 (90 Base) MCG/ACT inhaler Inhale 2 puffs into the lungs every 6 (  six) hours as needed for wheezing or shortness of breath.  02/20/17   [provider]  aspirin EC 81 MG tablet Take 81 mg by mouth at bedtime.     [provider]  blood glucose meter kit and supplies KIT Dispense based on patient and insurance preference. Use up to four times daily as directed. (FOR ICD-9 250.00, 250.01). 06/12/17   Amin, Jeanella Flattery, MD  calcium carbonate (TUMS - DOSED IN MG ELEMENTAL CALCIUM) 500 MG chewable tablet Chew 1 tablet by mouth daily.    [provider]  Cetirizine HCl 10 MG TBDP Take 1 tablet by mouth daily.  02/26/17   [provider]  Coenzyme Q10 (CO Q 10 PO) Take 1 tablet by mouth daily.    [provider]  Continuous Blood Gluc Sensor MISC 1 each by Does not apply route as directed. Use as directed every 10 days. May dispense FreeStyle [employer] or similar. 06/12/17   Amin, Ankit Chirag, MD  dexamethasone (DECADRON) 4 MG tablet TAKE 1 TABLET TWICE DAILY FOR 3 DAYS (DAY BEFORE, DAY OF, AND DAY AFTER CHEMO) 07/30/17   Curt Bears, MD  diphenoxylate-atropine (LOMOTIL) 2.5-0.025 MG tablet Take 2 tablets by mouth 4 (four) times daily as needed for diarrhea or loose stools. Patient not taking: Reported on 09/19/2017 05/07/17   Harle Stanford., PA-C  fluticasone-salmeterol (ADVAIR HFA) 115-21 MCG/ACT inhaler Inhale 2 puffs into the lungs 2 (two) times daily. 05/29/17   Tanner, Lyndon Code., PA-C  folic acid (FOLVITE) 1 MG tablet Take 1 tablet (1 mg total) by mouth daily. 07/30/17   Maryanna Shape, NP  folic acid (FOLVITE) 1 MG tablet TAKE 1 TABLET DAILY 10/05/17   Curt Bears, MD  glipiZIDE (GLUCOTROL) 10 MG tablet Take 1 tablet (10 mg total) by mouth daily before breakfast. Patient taking differently: Take 10 mg by mouth daily before breakfast.  06/13/17   Amin, Jeanella Flattery, MD  lidocaine (XYLOCAINE) 2 % solution Use as directed 20 mLs in the mouth or throat every 3 (three) hours as needed for mouth pain. 05/11/17   Tanner, Lyndon Code., PA-C  lidocaine-prilocaine (EMLA) cream Apply 1 application topically as needed. 12/27/16   Curt Bears, MD  loperamide (IMODIUM A-D) 2 MG tablet Take 2 mg by mouth daily as needed for diarrhea or loose stools.    [provider]  LORazepam (ATIVAN) 0.5 MG tablet Take 1 tablet (0.5 mg total) by mouth 2 (two) times daily as needed for anxiety. 07/16/17   Tanner, Lyndon Code., PA-C  Lutein 10 MG TABS Take 10 mg by mouth at bedtime.     [provider]  magic mouthwash  SOLN Take 5 mLs by mouth 4 (four) times daily as needed for mouth pain. Patient taking differently: Take 5 mLs by mouth 4 (four) times daily as needed for mouth pain. SWISH AND SPIT OUT 01/22/17   Tanner, Lyndon Code., PA-C  Melatonin 5 MG TABS Take 5 mg by mouth at bedtime.    [provider]  Methylcobalamin (METHYL B-12 PO) Take 1 tablet by mouth daily.    [provider]  mirtazapine (REMERON) 15 MG tablet Take 15 mg by mouth at bedtime.    [provider]  Multiple Vitamin (MULITIVITAMIN WITH MINERALS) TABS Take 1 tablet by mouth daily.    [provider]  nystatin (MYCOSTATIN/NYSTOP) powder Apply topically 3 (three) times daily. Apply to groin rash. 10/11/17   Curt Bears, MD  pantoprazole ([place])  40 MG tablet Take 40 mg by mouth daily.     [provider]  pravastatin (PRAVACHOL) 40 MG tablet Take 40 mg by mouth at bedtime.  10/17/13   [provider]  predniSONE (DELTASONE) 5 MG tablet Take 5 mg by mouth daily with breakfast.    [provider]  prochlorperazine (COMPAZINE) 10 MG tablet Take 1 tablet (10 mg total) by mouth every 6 (six) hours as needed for nausea or vomiting. 12/27/16   Curt Bears, MD  tamsulosin (FLOMAX) 0.4 MG CAPS capsule TAKE (1) CAPSULE DAILY, START 4 DAYS BEFORE PROCEDURE Patient taking differently: take 1 capsule by mouth every morning 11/20/13   Greer Pickerel, MD  triamcinolone (NASACORT) 55 MCG/ACT AERO nasal inhaler Place 1 spray into the nose at bedtime.  08/30/16 08/30/17  [provider]    Family History Family History  Problem Relation Age of Onset  . Stroke Father   . Diabetes Father   . Heart disease Father   . Cancer Brother        pancreatic    Social History Social History   Tobacco Use  . Smoking status: Former Smoker    Packs/day: 0.50    Years: 50.00    Pack years: 25.00    Types: Cigarettes    Last attempt to quit: 11/30/2015    Years since quitting: 1.8  .  Smokeless tobacco: Never Used  Substance Use Topics  . Alcohol use: Not Currently  . Drug use: No     Allergies   Hydrocodone and Oxycodone   Review of Systems Review of Systems  Constitutional: Positive for activity change, appetite change, fatigue and fever (unsure).  HENT: Negative for congestion.   Respiratory: Positive for cough, shortness of breath and wheezing.   Cardiovascular: Positive for chest pain. Negative for palpitations and leg swelling.  Gastrointestinal: Positive for nausea. Negative for abdominal pain, diarrhea and vomiting.  Genitourinary: Positive for frequency. Negative for difficulty urinating and hematuria.  Allergic/Immunologic: Positive for immunocompromised state.  All other systems reviewed and are negative.    Physical Exam Updated Vital Signs BP 110/83 (BP Location: Right Arm)   Pulse (!) 109   Temp 98.9 F (37.2 C) (Oral)   Resp 12   SpO2 98%   Physical Exam  Constitutional: He is oriented to person, place, and time. He appears well-developed and well-nourished. No distress.  Elderly, fatigued appearing male in NAD  HENT:  Head: Normocephalic and atraumatic.  Eyes: Pupils are equal, round, and reactive to light. Conjunctivae are normal. Right eye exhibits no discharge. Left eye exhibits no discharge. No scleral icterus.  Neck: Normal range of motion.  Cardiovascular: Normal rate and regular rhythm.  Pulmonary/Chest: Effort normal and breath sounds normal. No respiratory distress.  Port-a-cath in place in right upper chest wall  Abdominal: Soft. Bowel sounds are normal. He exhibits no distension. There is tenderness (generalized tenderness).  Neurological: He is alert and oriented to person, place, and time.  Skin: Skin is warm and dry.  Psychiatric: He has a normal mood and affect. His behavior is normal.  Nursing note and vitals reviewed.    ED Treatments / Results  Labs (all labs ordered are listed, but only abnormal results are  displayed) Labs Reviewed  URINALYSIS, ROUTINE W REFLEX MICROSCOPIC    EKG None  Radiology Dg Chest 2 View  Result Date: 10/15/2017 CLINICAL DATA:  77 year old male with nausea and diarrhea for 1 week. Dehydration. Undergoing lung cancer treatment. EXAM: CHEST -  2 VIEW COMPARISON:  Staging CT chest abdomen pelvis 09/07/2017, and earlier. FINDINGS: Semi upright AP and lateral views of the chest. Stable right chest porta cath, accessed. Radiographically regressed right hilar region mass since February. Other mediastinal contours remain stable. No pneumothorax, pulmonary edema or pleural effusion. Asymmetric streaky and reticular opacity in the left lower lung appears stable. No new pulmonary opacity identified. No acute osseous abnormality identified. Negative visible bowel gas pattern. No pneumoperitoneum along the diaphragm. IMPRESSION: 1. Radiographic regression of right hilar/lung mass since February. Underlying chronic lung disease. 2. No new cardiopulmonary abnormality. Electronically Signed   By: Genevie Ann M.D.   On: 10/15/2017 17:39    Procedures Procedures (including critical care time)  Medications Ordered in ED Medications  0.9 %  sodium chloride infusion (has no administration in time range)     Initial Impression / Assessment and Plan / ED Course  I have reviewed the triage vital signs and the nursing notes.  Pertinent labs & imaging results that were available during my care of the patient were reviewed by me and considered in my medical decision making (see chart for details).  77 year old male presents with worsening weakness and multiple other symptoms presents for evaluation after abnormal labs at the Cancer center. He is tachycardic but otherwise vitals are normal and he is in NAD. His exam is unremarkable. Labs reveal a mild AKI and hypomagnesemia (1.1). He was started on NS and Mag. Family is also requesting a CXR and UA because he's had ongoing cough, wheezing, SOB as  well as urinary frequency. Discussed with Dr. Alvino Chapel. Will admit for further management. Discussed with Dr. Aileen Fass with [facility] who will admit.  Final Clinical Impressions(s) / ED Diagnoses   Final diagnoses:  AKI (acute kidney injury) [facility])  Hypomagnesemia    ED Discharge Orders    None       Recardo Evangelist, PA-C 10/15/17 1754    Davonna Belling, MD 10/15/17 2358

## 2017-10-15 NOTE — Progress Notes (Signed)
[facility] Progress Note   Mark Howell [id number] 05-07-1940 77 y.o.  Mark Howell is managed by Dr. Fanny Bien. Mark Howell  Actively treated with chemotherapy/immunotherapy: yes  Current Therapy: Alimta and Avastin  Last Treated:   10/02/2017 (cycle 5, day 1)  Assessment: Plan:    Dehydration - Plan: sodium chloride 0.9 % 1,000 mL with magnesium sulfate 4 g infusion  Hypomagnesemia - Plan: sodium chloride 0.9 % 1,000 mL with magnesium sulfate 4 g infusion  Adenocarcinoma of right lung, stage 4 (HCC)  Diarrhea, unspecified type   Metastatic adenocarcinoma of the right lung: The patient is status post cycle 5, day 1 of Alimta and Avastin which was dosed on 10/02/2017.  Hypomagnesemia.:  The patient's magnesium level returned at 1.1.  He was begun on 1 L of normal saline with 4 g of magnesium sulfate added.  Dehydration and diarrhea: The patient was taken to the emergency room at [facility] for management.  Please see After Visit Summary for patient specific instructions.  Future Appointments  Date Time Provider [place]  10/23/2017  8:45 AM [facility] LAB 1 [facility] None  10/23/2017  9:00 AM [facility] FLUSH NURSE [facility] None  10/23/2017  9:15 AM Curt Bears, MD [facility] None  10/23/2017 10:15 AM [facility] G23 [facility] None  11/06/2017 10:00 AM Rigoberto Noel, MD [facility] None  11/13/2017  8:45 AM [facility] LAB 1 [facility] None  11/13/2017  9:15 AM [facility] [place] FLUSH [facility] None  11/13/2017  9:30 AM Curt Bears, MD [facility] None  11/13/2017 10:15 AM [facility] INFUSION [facility] None  11/15/2017 10:30 AM Gery Pray, MD [facility] None  12/04/2017  8:15 AM [facility] LAB 1 [facility] None  12/04/2017  8:30 AM [facility] FLUSH NURSE [facility] None  12/04/2017  8:45 AM Curt Bears, MD [facility] None  12/04/2017  9:30 AM [facility] INFUSION [facility] None  12/25/2017  8:15 AM [facility] LAB  1 [facility] None  12/25/2017  8:30 AM [facility] FLUSH NURSE [facility] None  12/25/2017  9:00 AM Curt Bears, MD [facility] None  12/25/2017 10:15 AM [facility] INFUSION [facility] None    No orders of the defined types were placed in this encounter.      Subjective:   Patient ID:  Mark Howell is a 77 y.o. (DOB 1941/01/14) male.  Chief Complaint: No chief complaint on file.   HPI Mark Howell is a 77 year old male with a history of a metastatic non-small cell lung cancer, adenocarcinoma who is managed by Dr. Fanny Bien. Mark Howell and is status post cycle 5, day 1 of Alimta and Avastin which was last dose on 10/02/2017.  The patient presents to the clinic today with a weeklong history of diarrhea. He is weak and dizzy.  He has progressive shortness of breath which has been increasing in severity.  Labs completed today returned with a magnesium of 1.1, BUN of 65, and creatinine of 2.21.  The patient is agreeable to be transferred to the emergency room at [facility] for management.  He presents to the clinic today with his wife and son.  Medications: I have reviewed the patient's current medications.  Allergies:  Allergies  Allergen Reactions  . Hydrocodone Rash  . Oxycodone Rash    Past Medical History:  Diagnosis Date  . Arthritis   . Complication of anesthesia    problems voiding post op  . Diabetes mellitus without complication ([place])   . Full dentures   . GERD (gastroesophageal reflux disease)   .  History of radiation therapy 01/25/16-02/28/16   right lung 45 Gy  . History of radiation therapy 04/17/16-05/02/16   right lung boost 20 Gy in 10 fractions cumulative dose 65 gray  . HOH (hard of hearing)   . Hyperlipidemia   . lung ca dx'd 01/2016  . Lung mass 12/30/2015  . Odynophagia 04/06/2016  . Psoriatic arthritis ([place])   . Snores   . Wears glasses     Past Surgical History:  Procedure Laterality Date  . COLONOSCOPY    . HEMORRHOID SURGERY N/A  10/01/2013   Procedure: EXAM UNDER ANESTHESIA  AND EXCISIONAL HEMORRHOIDECTOMY WITH HEMORRHOID BANDING X 2;  Surgeon: Gayland Curry, MD;  Location: [facility];  Service: General;  Laterality: N/A;  . INGUINAL HERNIA REPAIR  01/04/2012   Procedure: LAPAROSCOPIC INGUINAL HERNIA;  Surgeon: Gayland Curry, MD,FACS;  Location: [facility];  Service: General;  Laterality: Right;  . IR FLUORO GUIDE PORT INSERTION RIGHT  01/02/2017  . IR US GUIDE VASC ACCESS RIGHT  01/02/2017  . VIDEO BRONCHOSCOPY WITH ENDOBRONCHIAL ULTRASOUND N/A 12/31/2015   Procedure: VIDEO BRONCHOSCOPY WITH ENDOBRONCHIAL ULTRASOUND transbronchial biopsy of node 10 R lymph node;  Surgeon: Grace Isaac, MD;  Location[facility];  Service: Thoracic;  Laterality: N/A;    Family History  Problem Relation Age of Onset  . Stroke Father   . Diabetes Father   . Heart disease Father   . Cancer Brother        pancreatic    Social History   Socioeconomic History  . Marital status: Married    Spouse name: Not on file  . Number of children: Not on file  . Years of education: Not on file  . Highest education level: Not on file  Occupational History  . Not on file  Social Needs  . Financial resource strain: Not on file  . Food insecurity:    Worry: Not on file    Inability: Not on file  . Transportation needs:    Medical: Not on file    Non-medical: Not on file  Tobacco Use  . Smoking status: Former Smoker    Packs/day: 0.50    Years: 50.00    Pack years: 25.00    Types: Cigarettes    Last attempt to quit: 11/30/2015    Years since quitting: 1.8  . Smokeless tobacco: Never Used  Substance and Sexual Activity  . Alcohol use: Not Currently  . Drug use: No  . Sexual activity: Not Currently  Lifestyle  . Physical activity:    Days per week: Not on file    Minutes per session: Not on file  . Stress: Not on file  Relationships  . Social connections:    Talks on phone: Not on file    Gets together: Not on file     Attends religious service: Not on file    Active member of club or organization: Not on file    Attends meetings of clubs or organizations: Not on file    Relationship status: Not on file  . Intimate partner violence:    Fear of current or ex partner: Not on file    Emotionally abused: Not on file    Physically abused: Not on file    Forced sexual activity: Not on file  Other Topics Concern  . Not on file  Social History Narrative  . Not on file    Past Medical History, Surgical history, Social history, and Family history were  reviewed and updated as appropriate.   Please see review of systems for further details on the patient's review from today.   Review of Systems:  Review of Systems  Constitutional: Negative for appetite change, chills, diaphoresis and fever.  Respiratory: Negative for cough, chest tightness and shortness of breath.   Cardiovascular: Negative for chest pain, palpitations and leg swelling.  Gastrointestinal: Positive for diarrhea. Negative for abdominal distention, abdominal pain, blood in stool, constipation, nausea and vomiting.  Genitourinary: Negative for decreased urine volume and difficulty urinating.  Neurological: Negative for weakness.    Objective:   Physical Exam:  BP 112/79 (BP Location: Left Arm, Patient Position: Sitting)   Pulse (!) 117 Comment: dr Satira Sark aware of pulse  Temp (!) 97.5 F (36.4 C) (Oral)   Resp 18   Ht 6' 1.5" (1.867 m)   SpO2 98%   BMI 22.31 kg/m  ECOG: 2  Physical Exam  Constitutional: No distress.  HENT:  Head: Normocephalic and atraumatic.  Mouth/Throat: Oropharynx is clear and moist.  Cardiovascular: Regular rhythm and normal heart sounds. Tachycardia present. Exam reveals no gallop and no friction rub.  No murmur heard. Pulmonary/Chest: Tachypnea noted. He has no wheezes. He has rales in the right lower field.  Abdominal: Soft. Bowel sounds are normal. He exhibits no distension and no mass. There is no  tenderness. There is no rebound and no guarding.  Musculoskeletal: He exhibits no edema.  Neurological: He is alert.  Skin: Skin is warm and dry. He is not diaphoretic.    Lab Review:     Component Value Date/Time   NA 134 (L) 10/15/2017 1320   NA 135 (L) 04/30/2017 0824   K 4.9 10/15/2017 1320   K 4.4 04/30/2017 0824   CL 98 10/15/2017 1320   CO2 22 10/15/2017 1320   CO2 19 (L) 04/30/2017 0824   GLUCOSE 232 (H) 10/15/2017 1320   GLUCOSE 277 (H) 04/30/2017 0824   BUN 65 (H) 10/15/2017 1320   BUN 28.0 (H) 04/30/2017 0824   CREATININE 2.21 (H) 10/15/2017 1320   CREATININE 1.5 (H) 04/30/2017 0824   CALCIUM 9.9 10/15/2017 1320   CALCIUM 7.6 (L) 04/30/2017 0824   PROT 7.8 10/15/2017 1320   PROT 7.7 04/30/2017 0824   ALBUMIN 3.6 10/15/2017 1320   ALBUMIN 3.7 04/30/2017 0824   AST 33 10/15/2017 1320   AST 22 04/30/2017 0824   ALT 21 10/15/2017 1320   ALT 19 04/30/2017 0824   ALKPHOS 105 10/15/2017 1320   ALKPHOS 88 04/30/2017 0824   BILITOT 0.4 10/15/2017 1320   BILITOT 0.57 04/30/2017 0824   GFRNONAA 27 (L) 10/15/2017 1320   GFRAA 31 (L) 10/15/2017 1320       Component Value Date/Time   WBC 7.1 10/15/2017 1320   WBC 12.6 (H) 06/12/2017 0153   RBC 3.72 (L) 10/15/2017 1320   HGB 11.7 (L) 10/15/2017 1320   HGB 10.9 (L) 04/30/2017 0824   HCT 35.2 (L) 10/15/2017 1320   HCT 32.7 (L) 04/30/2017 0824   PLT 82 (L) 10/15/2017 1320   PLT 118 (L) 04/30/2017 0824   MCV 94.7 10/15/2017 1320   MCV 101.9 (H) 04/30/2017 0824   MCH 31.5 10/15/2017 1320   MCHC 33.2 10/15/2017 1320   RDW 23.0 (H) 10/15/2017 1320   RDW 19.3 (H) 04/30/2017 0824   LYMPHSABS 0.2 (L) 10/15/2017 1320   LYMPHSABS 1.3 04/30/2017 0824   MONOABS 1.5 (H) 10/15/2017 1320   MONOABS 1.2 (H) 04/30/2017 0824   EOSABS 0.0  10/15/2017 1320   EOSABS 0.0 04/30/2017 0824   BASOSABS 0.0 10/15/2017 1320   BASOSABS 0.0 04/30/2017 0824   -------------------------------  Imaging from last 24 hours (if  applicable):  Radiology interpretation: Dg Chest 2 View  Result Date: 10/15/2017 CLINICAL DATA:  77 year old male with nausea and diarrhea for 1 week. Dehydration. Undergoing lung cancer treatment. EXAM: CHEST - 2 VIEW COMPARISON:  Staging CT chest abdomen pelvis 09/07/2017, and earlier. FINDINGS: Semi upright AP and lateral views of the chest. Stable right chest porta cath, accessed. Radiographically regressed right hilar region mass since February. Other mediastinal contours remain stable. No pneumothorax, pulmonary edema or pleural effusion. Asymmetric streaky and reticular opacity in the left lower lung appears stable. No new pulmonary opacity identified. No acute osseous abnormality identified. Negative visible bowel gas pattern. No pneumoperitoneum along the diaphragm. IMPRESSION: 1. Radiographic regression of right hilar/lung mass since February. Underlying chronic lung disease. 2. No new cardiopulmonary abnormality. Electronically Signed   By: Genevie Ann M.D.   On: 10/15/2017 17:39        This case was discussed with Dr. Julien Nordmann. He expressed agreement with my management of this patient.

## 2017-10-15 NOTE — H&P (Signed)
History and Physical    Mark Howell CZY:[id number] DOB: Sep 22, 1940 DOA: 10/15/2017  PCP: [name], MD  Patient coming from: home  I have personally briefly reviewed patient's old medical records in [place]  Chief Complaint: low mag  HPI: Mark Howell is a 77 y.o. male with medical history significant of past medical history of metastatic lung cancer stage IV on chemotherapy bevacicumab last chemotherapy about a week ago, radiation therapy the day before he admission diabetes mellitus who comes into the hospital for fatigue weakness after seeing his oncologist on the day of admission, in the oncologist office labs were checked and it showed look magnesium, so he was started on magnesium replacement.  The patient is mildly confused and with a slow thinking unable to answer questions in detail.  As he had some cough but no fevers, he relate he had his cough for several weeks he is supposed to see a pulmonologist this week for evaluation of his cough.  ED Course:  Vitals are stable with mild tachycardia has remained afebrile, basic metabolic panel shows sodium 134 creatinine of 2.2 (baseline creatinine 1.3) mild thrombocytopenia and mild anemia  Review of Systems: As per HPI otherwise 10 point review of systems negative.    Past Medical History:  Diagnosis Date  . Arthritis   . Complication of anesthesia    problems voiding post op  . Diabetes mellitus without complication ([place])   . Full dentures   . GERD (gastroesophageal reflux disease)   . History of radiation therapy 01/25/16-02/28/16   right lung 45 Gy  . History of radiation therapy 04/17/16-05/02/16   right lung boost 20 Gy in 10 fractions cumulative dose 65 gray  . HOH (hard of hearing)   . Hyperlipidemia   . lung ca dx'd 01/2016  . Lung mass 12/30/2015  . Odynophagia 04/06/2016  . Psoriatic arthritis ([place])   . Snores   . Wears glasses     Past Surgical History:  Procedure Laterality Date  . COLONOSCOPY     . HEMORRHOID SURGERY N/A 10/01/2013   Procedure: EXAM UNDER ANESTHESIA  AND EXCISIONAL HEMORRHOIDECTOMY WITH HEMORRHOID BANDING X 2;  Surgeon: Gayland Curry, MD;  Location: [facility];  Service: General;  Laterality: N/A;  . INGUINAL HERNIA REPAIR  01/04/2012   Procedure: LAPAROSCOPIC INGUINAL HERNIA;  Surgeon: Gayland Curry, MD,FACS;  Location: [facility];  Service: General;  Laterality: Right;  . IR FLUORO GUIDE PORT INSERTION RIGHT  01/02/2017  . IR US GUIDE VASC ACCESS RIGHT  01/02/2017  . VIDEO BRONCHOSCOPY WITH ENDOBRONCHIAL ULTRASOUND N/A 12/31/2015   Procedure: VIDEO BRONCHOSCOPY WITH ENDOBRONCHIAL ULTRASOUND transbronchial biopsy of node 10 R lymph node;  Surgeon: Grace Isaac, MD;  Location: [place];  Service: Thoracic;  Laterality: N/A;     reports that he quit smoking about 22 months ago. His smoking use included cigarettes. He has a 25.00 pack-year smoking history. He has never used smokeless tobacco. He reports that he drank alcohol. He reports that he does not use drugs.  Allergies  Allergen Reactions  . Hydrocodone Rash  . Oxycodone Rash    Family History  Problem Relation Age of Onset  . Stroke Father   . Diabetes Father   . Heart disease Father   . Cancer Brother        pancreatic    Prior to Admission medications   Medication Sig Start Date End Date Taking? Authorizing Provider  albuterol (VENTOLIN HFA) 108 (90  Base) MCG/ACT inhaler Inhale 2 puffs into the lungs every 6 (six) hours as needed for wheezing or shortness of breath.  02/20/17  Yes [provider]  aspirin EC 81 MG tablet Take 81 mg by mouth at bedtime.    Yes [provider]  calcium carbonate (TUMS - DOSED IN MG ELEMENTAL CALCIUM) 500 MG chewable tablet Chew 1 tablet by mouth daily.   Yes [provider]  Cetirizine HCl 10 MG TBDP Take 1 tablet by mouth daily.  02/26/17  Yes [provider]  Coenzyme Q10 (CO Q 10 PO) Take 1 tablet by mouth daily.   Yes  [provider]  dexamethasone (DECADRON) 4 MG tablet TAKE 1 TABLET TWICE DAILY FOR 3 DAYS (DAY BEFORE, DAY OF, AND DAY AFTER CHEMO) 07/30/17  Yes Curt Bears, MD  diphenoxylate-atropine (LOMOTIL) 2.5-0.025 MG tablet Take 2 tablets by mouth 4 (four) times daily as needed for diarrhea or loose stools. 05/07/17  Yes Tanner, Lyndon Code., PA-C  fluticasone-salmeterol (ADVAIR HFA) 115-21 MCG/ACT inhaler Inhale 2 puffs into the lungs 2 (two) times daily. 05/29/17  Yes Tanner, Lyndon Code., PA-C  folic acid (FOLVITE) 1 MG tablet TAKE 1 TABLET DAILY 10/05/17  Yes Curt Bears, MD  GLIPIZIDE XL 5 MG 24 hr tablet Take 5 mg by mouth daily with breakfast.  09/26/17  Yes [provider]  lidocaine-prilocaine (EMLA) cream Apply 1 application topically as needed. 12/27/16  Yes Curt Bears, MD  LORazepam (ATIVAN) 0.5 MG tablet Take 1 tablet (0.5 mg total) by mouth 2 (two) times daily as needed for anxiety. 07/16/17  Yes Tanner, Lyndon Code., PA-C  Lutein 10 MG TABS Take 10 mg by mouth at bedtime.    Yes [provider]  magic mouthwash SOLN Take 5 mLs by mouth 4 (four) times daily as needed for mouth pain. Patient taking differently: Take 5 mLs by mouth 4 (four) times daily as needed for mouth pain. SWISH AND SPIT OUT 01/22/17  Yes Tanner, Lyndon Code., PA-C  Melatonin 5 MG TABS Take 5 mg by mouth at bedtime.   Yes [provider]  Methylcobalamin (METHYL B-12 PO) Take 1 tablet by mouth daily.   Yes [provider]  mirtazapine (REMERON) 30 MG tablet Take 30 mg by mouth at bedtime.  09/26/17  Yes [provider]  Multiple Vitamin (MULITIVITAMIN WITH MINERALS) TABS Take 1 tablet by mouth daily.   Yes [provider]  nystatin (MYCOSTATIN/NYSTOP) powder Apply topically 3 (three) times daily. Apply to groin rash. 10/11/17  Yes Curt Bears, MD  pantoprazole (PROTONIX) 40 MG tablet Take 40 mg by mouth daily.    Yes [provider]  pravastatin (PRAVACHOL) 40 MG  tablet Take 40 mg by mouth at bedtime.  10/17/13  Yes [provider]  predniSONE (DELTASONE) 5 MG tablet Take 10 mg by mouth daily with breakfast.    Yes [provider]  prochlorperazine (COMPAZINE) 10 MG tablet Take 1 tablet (10 mg total) by mouth every 6 (six) hours as needed for nausea or vomiting. 12/27/16  Yes Curt Bears, MD  tamsulosin (FLOMAX) 0.4 MG CAPS capsule TAKE (1) CAPSULE DAILY, START 4 DAYS BEFORE PROCEDURE Patient taking differently: take 1 capsule by mouth every morning 11/20/13  Yes Greer Pickerel, MD  triamcinolone (NASACORT ALLERGY 24HR) 55 MCG/ACT AERO nasal inhaler Place 1 spray into the nose at bedtime.   Yes [provider]  blood glucose meter kit and supplies KIT Dispense based on patient and insurance preference.  Use up to four times daily as directed. (FOR ICD-9 250.00, 250.01). 06/12/17   Damita Lack, MD  Continuous Blood Gluc Sensor MISC 1 each by Does not apply route as directed. Use as directed every 10 days. May dispense FreeStyle [employer] or similar. 06/12/17   Amin, Jeanella Flattery, MD  folic acid (FOLVITE) 1 MG tablet Take 1 tablet (1 mg total) by mouth daily. Patient not taking: Reported on 10/15/2017 07/30/17   Maryanna Shape, NP  glipiZIDE (GLUCOTROL) 10 MG tablet Take 1 tablet (10 mg total) by mouth daily before breakfast. Patient not taking: Reported on 10/15/2017 06/13/17   Damita Lack, MD  lidocaine (XYLOCAINE) 2 % solution Use as directed 20 mLs in the mouth or throat every 3 (three) hours as needed for mouth pain. 05/11/17   Tanner, Lyndon Code., PA-C  loperamide (IMODIUM A-D) 2 MG tablet Take 2 mg by mouth daily as needed for diarrhea or loose stools.    [provider]  triamcinolone (NASACORT) 55 MCG/ACT AERO nasal inhaler Place 1 spray into the nose at bedtime.  08/30/16 08/30/17  [provider]    Physical Exam: Vitals:   10/15/17 1621 10/15/17 1630 10/15/17 1645 10/15/17 1700  BP: 110/83  116/79 111/84 113/79  Pulse: (!) 109 (!) 107 (!) 108 (!) 106  Resp: 12 (!) '30 20 18  '$ Temp: 98.9 F (37.2 C)     TempSrc: Oral     SpO2: 98% 100% 92% 97%    Constitutional: NAD, calm, comfortable Vitals:   10/15/17 1621 10/15/17 1630 10/15/17 1645 10/15/17 1700  BP: 110/83 116/79 111/84 113/79  Pulse: (!) 109 (!) 107 (!) 108 (!) 106  Resp: 12 (!) '30 20 18  '$ Temp: 98.9 F (37.2 C)     TempSrc: Oral     SpO2: 98% 100% 92% 97%   Eyes: PERRL, lids and conjunctivae normal ENMT: Mucous membranes are moist. Posterior pharynx clear of any exudate or lesions.Normal dentition.  Neck: normal, supple, no masses, no thyromegaly Respiratory: clear to auscultation bilaterally, no wheezing, no crackles. Normal respiratory effort. No accessory muscle use.  Cardiovascular: Regular rate and rhythm, no murmurs / rubs / gallops. No extremity edema. 2+ pedal pulses. No carotid bruits.  Abdomen: no tenderness, no masses palpated. No hepatosplenomegaly. Bowel sounds positive.  Musculoskeletal: no clubbing / cyanosis. No joint deformity upper and lower extremities. Good ROM, no contractures. Normal muscle tone.  Skin: no rashes, lesions, ulcers. No induration Neurologic: CN 2-12 grossly intact. Sensation intact, mild hyperreflexia Psychiatric: He is slow to answer and cannot remember details of what is happened the day prior or even today.  He seems to be awake alert oriented x3 but is also slow to response.    Labs on Admission: I have personally reviewed following labs and imaging studies  CBC: Recent Labs  Lab 10/15/17 1320  WBC 7.1  NEUTROABS 5.3  HGB 11.7*  HCT 35.2*  MCV 94.7  PLT 82*   Basic Metabolic Panel: Recent Labs  Lab 10/15/17 1320  NA 134*  K 4.9  CL 98  CO2 22  GLUCOSE 232*  BUN 65*  CREATININE 2.21*  CALCIUM 9.9  MG 1.1*   GFR: Estimated Creatinine Clearance: 30.8 mL/min (A) (by C-G formula based on SCr of 2.21 mg/dL (H)). Liver Function Tests: Recent Labs  Lab  10/15/17 1320  AST 33  ALT 21  ALKPHOS 105  BILITOT 0.4  PROT 7.8  ALBUMIN 3.6   No results for  input(s): LIPASE, AMYLASE in the last 168 hours. No results for input(s): AMMONIA in the last 168 hours. Coagulation Profile: No results for input(s): INR, PROTIME in the last 168 hours. Cardiac Enzymes: No results for input(s): CKTOTAL, CKMB, CKMBINDEX, TROPONINI in the last 168 hours. BNP (last 3 results) No results for input(s): PROBNP in the last 8760 hours. HbA1C: No results for input(s): HGBA1C in the last 72 hours. CBG: No results for input(s): GLUCAP in the last 168 hours. Lipid Profile: No results for input(s): CHOL, HDL, LDLCALC, TRIG, CHOLHDL, LDLDIRECT in the last 72 hours. Thyroid Function Tests: No results for input(s): TSH, T4TOTAL, FREET4, T3FREE, THYROIDAB in the last 72 hours. Anemia Panel: No results for input(s): VITAMINB12, FOLATE, FERRITIN, TIBC, IRON, RETICCTPCT in the last 72 hours. Urine analysis:    Component Value Date/Time   COLORURINE YELLOW 06/06/2017 1508   APPEARANCEUR CLEAR 06/06/2017 1508   LABSPEC 1.021 06/06/2017 1508   PHURINE 5.0 06/06/2017 1508   GLUCOSEU NEGATIVE 06/06/2017 1508   HGBUR SMALL (A) 06/06/2017 1508   BILIRUBINUR NEGATIVE 06/06/2017 1508   KETONESUR NEGATIVE 06/06/2017 1508   PROTEINUR <30 (A) 09/11/2017 1300   UROBILINOGEN 0.2 07/06/2011 1221   NITRITE NEGATIVE 06/06/2017 1508   LEUKOCYTESUR NEGATIVE 06/06/2017 1508    Radiological Exams on Admission: Dg Chest 2 View  Result Date: 10/15/2017 CLINICAL DATA:  77 year old male with nausea and diarrhea for 1 week. Dehydration. Undergoing lung cancer treatment. EXAM: CHEST - 2 VIEW COMPARISON:  Staging CT chest abdomen pelvis 09/07/2017, and earlier. FINDINGS: Semi upright AP and lateral views of the chest. Stable right chest porta cath, accessed. Radiographically regressed right hilar region mass since February. Other mediastinal contours remain stable. No pneumothorax,  pulmonary edema or pleural effusion. Asymmetric streaky and reticular opacity in the left lower lung appears stable. No new pulmonary opacity identified. No acute osseous abnormality identified. Negative visible bowel gas pattern. No pneumoperitoneum along the diaphragm. IMPRESSION: 1. Radiographic regression of right hilar/lung mass since February. Underlying chronic lung disease. 2. No new cardiopulmonary abnormality. Electronically Signed   By: Genevie Ann M.D.   On: 10/15/2017 17:39    EKG: Independently reviewed.  Sinus tachycardia normal axis sinus rhythm probable left atrial enlargement, QTC of 421  Assessment/Plan Severe hypomagnesemia: We will admit him to telemetry, he was started on IV magnesium at the oncologist office, when checked here it was 1. He is on chemotherapy bevacicumab which is known to cause significant hypomagnesemia. We will repeat Magnesium tonight if continues to be low we will replete orally.  Acute metabolic encephalopathy: This probably secondary to his extremely low magnesium, his other electrolytes are within normal limits.  He is nonfocal physical exam. He is extremely slow to answer questions, and does not seem to recall details and what is happening or why he is here in the hospital. Repeat magnesium rechecked tonight and reevaluate mental status in the morning.  AKI (acute kidney injury) (HCC)/  CKD (chronic kidney disease), stage III ([place]): He relate some dizziness upon standing likely prerenal in etiology we will start him on aggressive IV fluid hydration recheck a basic metabolic panel in the morning. Check urinary creatinine and urinary sodium.  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  Thrombocytopenia: Likely due to chemotherapy, no signs of overt bleeding, will place SCDs instead of anticoagulation repeat CBC in the morning.  Normocytic anemia: Hemoglobin at  baseline no signs of overt bleeding.  Diabetes mellitus type 2: Hold oral hypoglycemic agents, will start him on sliding scale insulin. Check a hemoglobin A1c.  DVT prophylaxis: Scd Code Status: Full Family Communication: None Disposition Plan: Observation Consults called: None Admission status: observation   Charlynne Cousins MD [facility] Pager [phone]  If 7PM-7AM, please contact night-coverage [email or web address] Password [facility]  10/15/2017, 6:03 PM

## 2017-10-16 ENCOUNTER — Other Ambulatory Visit: Payer: Self-pay

## 2017-10-16 DIAGNOSIS — C3490 Malignant neoplasm of unspecified part of unspecified bronchus or lung: Secondary | ICD-10-CM

## 2017-10-16 DIAGNOSIS — N183 Chronic kidney disease, stage 3 (moderate): Secondary | ICD-10-CM | POA: Diagnosis not present

## 2017-10-16 DIAGNOSIS — R197 Diarrhea, unspecified: Secondary | ICD-10-CM | POA: Diagnosis not present

## 2017-10-16 DIAGNOSIS — D696 Thrombocytopenia, unspecified: Secondary | ICD-10-CM

## 2017-10-16 DIAGNOSIS — N179 Acute kidney failure, unspecified: Secondary | ICD-10-CM | POA: Diagnosis not present

## 2017-10-16 LAB — BASIC METABOLIC PANEL
ANION GAP: 11 (ref 5–15)
BUN: 57 mg/dL — ABNORMAL HIGH (ref 6–20)
CALCIUM: 8.6 mg/dL — AB (ref 8.9–10.3)
CO2: 20 mmol/L — ABNORMAL LOW (ref 22–32)
Chloride: 106 mmol/L (ref 101–111)
Creatinine, Ser: 1.73 mg/dL — ABNORMAL HIGH (ref 0.61–1.24)
GFR calc non Af Amer: 36 mL/min — ABNORMAL LOW (ref >60)
GFR, EST AFRICAN AMERICAN: 42 mL/min — AB (ref >60)
GLUCOSE: 179 mg/dL — AB (ref 65–99)
POTASSIUM: 4.6 mmol/L (ref 3.5–5.1)
Sodium: 137 mmol/L (ref 135–145)

## 2017-10-16 LAB — COMPREHENSIVE METABOLIC PANEL
ALBUMIN: 3.2 g/dL — AB (ref 3.5–5.0)
ALK PHOS: 74 U/L (ref 38–126)
ALT: 21 U/L (ref 17–63)
ANION GAP: 13 (ref 5–15)
AST: 29 U/L (ref 15–41)
BUN: 61 mg/dL — ABNORMAL HIGH (ref 6–20)
CO2: 18 mmol/L — ABNORMAL LOW (ref 22–32)
Calcium: 8.6 mg/dL — ABNORMAL LOW (ref 8.9–10.3)
Chloride: 105 mmol/L (ref 101–111)
Creatinine, Ser: 1.79 mg/dL — ABNORMAL HIGH (ref 0.61–1.24)
GFR calc non Af Amer: 35 mL/min — ABNORMAL LOW (ref >60)
GFR, EST AFRICAN AMERICAN: 40 mL/min — AB (ref >60)
GLUCOSE: 128 mg/dL — AB (ref 65–99)
Potassium: 4.5 mmol/L (ref 3.5–5.1)
Sodium: 136 mmol/L (ref 135–145)
TOTAL PROTEIN: 6.5 g/dL (ref 6.5–8.1)
Total Bilirubin: 0.4 mg/dL (ref 0.3–1.2)

## 2017-10-16 LAB — URINALYSIS, ROUTINE W REFLEX MICROSCOPIC
BILIRUBIN URINE: NEGATIVE
GLUCOSE, UA: NEGATIVE mg/dL
HGB URINE DIPSTICK: NEGATIVE
KETONES UR: NEGATIVE mg/dL
Leukocytes, UA: NEGATIVE
Nitrite: NEGATIVE
PROTEIN: NEGATIVE mg/dL
Specific Gravity, Urine: 1.015 (ref 1.005–1.030)
pH: 5 (ref 5.0–8.0)

## 2017-10-16 LAB — CREATININE, URINE, RANDOM: CREATININE, URINE: 114.11 mg/dL

## 2017-10-16 LAB — HEMOGLOBIN A1C
Hgb A1c MFr Bld: 7.4 % — ABNORMAL HIGH (ref 4.8–5.6)
Mean Plasma Glucose: 165.68 mg/dL

## 2017-10-16 LAB — MAGNESIUM
Magnesium: 2.2 mg/dL (ref 1.7–2.4)
Magnesium: 2.1 mg/dL (ref 1.7–2.4)

## 2017-10-16 LAB — GLUCOSE, CAPILLARY
GLUCOSE-CAPILLARY: 143 mg/dL — AB (ref 65–99)
GLUCOSE-CAPILLARY: 117 mg/dL — AB (ref 65–99)
Glucose-Capillary: 100 mg/dL — ABNORMAL HIGH (ref 65–99)
Glucose-Capillary: 162 mg/dL — ABNORMAL HIGH (ref 65–99)

## 2017-10-16 LAB — MRSA PCR SCREENING: MRSA BY PCR: POSITIVE — AB

## 2017-10-16 LAB — SODIUM, URINE, RANDOM: Sodium, Ur: 74 mmol/L

## 2017-10-16 MED ORDER — ENSURE ENLIVE PO LIQD
237.0000 mL | Freq: Two times a day (BID) | ORAL | Status: DC
  Administered 2017-10-16: 237 mL via ORAL

## 2017-10-16 MED ORDER — DIPHENOXYLATE-ATROPINE 2.5-0.025 MG PO TABS
1.0000 | ORAL_TABLET | Freq: Four times a day (QID) | ORAL | Status: DC
Start: 2017-10-16 — End: 2017-10-17
  Administered 2017-10-16 (×3): 1 via ORAL
  Filled 2017-10-16 (×3): qty 1

## 2017-10-16 MED ORDER — DIPHENOXYLATE-ATROPINE 2.5-0.025 MG PO TABS
2.0000 | ORAL_TABLET | Freq: Once | ORAL | Status: AC
  Administered 2017-10-16: 2 via ORAL

## 2017-10-16 NOTE — Progress Notes (Addendum)
Initial Nutrition Assessment  DOCUMENTATION CODES:   Not applicable  INTERVENTION:    Ensure Enlive po BID, each supplement provides 350 kcal and 20 grams of protein  Obtain recent weight  NUTRITION DIAGNOSIS:   Increased nutrient needs related to cancer and cancer related treatments as evidenced by estimated needs.  GOAL:   Patient will meet greater than or equal to 90% of their needs  MONITOR:   PO intake, Supplement acceptance, Weight trends, Labs  REASON FOR ASSESSMENT:   Malnutrition Screening Tool   ASSESSMENT:   Patient with PMH significant for metastatic lung cancer stage IV on chemotherapy bevacicumab (last chemotherapy about a week ago), radiation therapy the day before he admission, and DM. Presents this admission severe hypomagnesemia and acute metabolic encephalopathy.    Pt reports appetite is off and on due to taste changes from chemotherapy. His wife typically cooks three meals/day that consist of a meat/protein, vegetable, and grain. Within the last two weeks he could only tolerate bites at each meal. Pt drinks Ensure once/day. He is willing to continue them here. Discussed the importance of protein intake for preservation of lean body mass. Discussed increasing Ensure to 2-3 times per day if poor appetite persist. Pt ate 75% of breakfast this morning that contained eggs, bacon, a muffin, and pineapple. Denies any nausea/vomiting.   Pt endorses a UBW of 185 lb. Records indicate pt weighed 180 lb on 04/23/17 and 171 lb on 10/02/17 (5% wt loss in 6 months, insignificant for time frame). Will need to obtain recent wt to quanitify any further wt loss. Nutrition-Focused physical exam completed.   Medications reviewed and include: folic acid, prosight MVI, prednisone Labs reviewed: Mg- replaced 2.1 WNL  NUTRITION - FOCUSED PHYSICAL EXAM:    Most Recent Value  Orbital Region  No depletion  Upper Arm Region  Moderate depletion  Thoracic and Lumbar Region  Unable to  assess  Buccal Region  No depletion  Temple Region  Mild depletion  Clavicle Bone Region  Mild depletion  Clavicle and Acromion Bone Region  Mild depletion  Scapular Bone Region  Unable to assess  Dorsal Hand  No depletion  Patellar Region  Moderate depletion  Anterior Thigh Region  Moderate depletion  Posterior Calf Region  Moderate depletion  Edema (RD Assessment)  None  Hair  Reviewed  Eyes  Reviewed  Mouth  Reviewed  Skin  Reviewed  Nails  Reviewed     Diet Order:   Diet Order           Diet regular Room service appropriate? Yes; Fluid consistency: Thin  Diet effective now          EDUCATION NEEDS:   Education needs have been addressed  Skin:  Skin Assessment: Reviewed RN Assessment  Last BM:  10/15/17  Height:   Ht Readings from Last 1 Encounters:  10/15/17 6' 1.5" (1.867 m)    Weight:   Wt Readings from Last 1 Encounters:  10/02/17 171 lb 6.4 oz (77.7 kg)    Ideal Body Weight:  83.6 kg  BMI:  There is no height or weight on file to calculate BMI.  Estimated Nutritional Needs:   Kcal:  2300-2600 kcal  Protein:  115-130 g  Fluid:  >2.3 L/day    Mariana Single RD, LDN Clinical Nutrition Pager # - [phone]

## 2017-10-16 NOTE — Progress Notes (Signed)
Patient passed a total of 3 blood clots during shift. Urine is otherwise clear after passing clots. MD aware. Information passed along to upcoming RN. Will continue to monitor.

## 2017-10-16 NOTE — Progress Notes (Addendum)
PROGRESS NOTE    Mark Howell   VXB:[id number]  DOB: 05-16-1940  DOA: 10/15/2017 PCP: [name], MD   Brief Narrative:  Mark Howell 77 y.o. male with medical history significant of past medical history of metastatic lung cancer stage IV on chemotherapy- last chemotherapy about a week ago, radiation therapy the day before admission, diabetes mellitus who comes into the hospital for fatigue weakness diarrhea x 1 wk. Presented after seeing his oncologist on the day of admission. In the oncologist office labs showed a low magnesium.   As he had some cough but no fevers, he relate he had his cough for several weeks he is supposed to see a pulmonologist this week for evaluation of his cough.   Subjective: He has had 2  Loose stools this AM and then after receiving a dose of Lomotil 4 mg, he had 2 more loose stools.  ROS: no complaints of nausea, vomiting   Assessment & Plan:   Principal Problem:   Diarrhea - cont Lomotil QID - has Imodium and Lomotil ordered at home for diarrhea - stop Ensure - cont IVF - abdomen is benign- no imaging done, no fevers or leukocytosis to suggest an infection    Active Problems:   Hypomagnesemia - due to above & has been replaced  AKI- CKD 3 - baseline Cr about 1.3- admitted with Cr of 2.21 - due to diarrhea and is improving- cont IVF while having diarrhea  Hematuria- BPH - patient passed a blood clot today, otherwise urine is clear   - cont Flomax  Chronic thrombocytopenia - follow   Lung CA with metastasis - due for last round of chemo in a couple of weeks- Dr Julien Nordmann -   getting radiation of adrenal met  DM2 - A1c 7.4- cont SSI- hold Glipizide  Chronic daily prednisone - 10 mg daily  DVT prophylaxis: sCDs Code Status: full code Family Communication:  Disposition Plan: home with diarrhea slows Consultants:   none Procedures:   none Antimicrobials:  Anti-infectives (From admission, onward)   None        Objective: Vitals:   10/15/17 1815 10/15/17 1830 10/15/17 2045 10/16/17 0441  BP: 111/87 124/90 98/70 109/81  Pulse: (!) 103 (!) 103 (!) 102 96  Resp: (!) 24 (!) _0 Temp:   98.7 F (37.1 C) 97.6 F (36.4 C)  TempSrc:   Oral   SpO2: 97% 99% 96% 98%    Intake/Output Summary (Last 24 hours) at 10/16/2017 1324 Last data filed at 10/16/2017 1231 Gross per 24 hour  Intake 942.5 ml  Output 4 ml  Net 938.5 ml   There were no vitals filed for this visit.  Examination: General exam: Appears comfortable  HEENT: PERRLA, oral mucosa moist, no sclera icterus or thrush Respiratory system: Clear to auscultation. Respiratory effort normal. Cardiovascular system: S1 & S2 heard, RRR.   Gastrointestinal system: Abdomen soft, non-tender, nondistended. Normal bowel sound. No organomegaly Central nervous system: Alert and oriented. No focal neurological deficits. Extremities: No cyanosis, clubbing or edema Skin: No rashes or ulcers Psychiatry:  Mood & affect appropriate.     Data Reviewed: I have personally reviewed following labs and imaging studies  CBC: Recent Labs  Lab 10/15/17 1320  WBC 7.1  NEUTROABS 5.3  HGB 11.7*  HCT 35.2*  MCV 94.7  PLT 82*   Basic Metabolic Panel: Recent Labs  Lab 10/15/17 1320 10/16/17 0204 10/16/17 1200  NA 134* 136 137  K 4.9 4.5 4.6  CL 98 105 106  CO2 22 18* 20*  GLUCOSE 232* 128* 179*  BUN 65* 61* 57*  CREATININE 2.21* 1.79* 1.73*  CALCIUM 9.9 8.6* 8.6*  MG 1.1* 2.2 2.1   GFR: CrCl cannot be calculated (Unknown ideal weight.). Liver Function Tests: Recent Labs  Lab 10/15/17 1320 10/16/17 0204  AST 33 29  ALT 21 21  ALKPHOS 105 74  BILITOT 0.4 0.4  PROT 7.8 6.5  ALBUMIN 3.6 3.2*   No results for input(s): LIPASE, AMYLASE in the last 168 hours. No results for input(s): AMMONIA in the last 168 hours. Coagulation Profile: No results for input(s): INR, PROTIME in the last 168 hours. Cardiac Enzymes: No results for  input(s): CKTOTAL, CKMB, CKMBINDEX, TROPONINI in the last 168 hours. BNP (last 3 results) No results for input(s): PROBNP in the last 8760 hours. HbA1C: Recent Labs    10/16/17 0212  HGBA1C 7.4*   CBG: Recent Labs  Lab 10/15/17 2049 10/16/17 0733 10/16/17 1218  GLUCAP 136* 100* 143*   Lipid Profile: No results for input(s): CHOL, HDL, LDLCALC, TRIG, CHOLHDL, LDLDIRECT in the last 72 hours. Thyroid Function Tests: No results for input(s): TSH, T4TOTAL, FREET4, T3FREE, THYROIDAB in the last 72 hours. Anemia Panel: No results for input(s): VITAMINB12, FOLATE, FERRITIN, TIBC, IRON, RETICCTPCT in the last 72 hours. Urine analysis:    Component Value Date/Time   COLORURINE YELLOW 06/06/2017 1508   APPEARANCEUR CLEAR 06/06/2017 1508   LABSPEC 1.021 06/06/2017 1508   PHURINE 5.0 06/06/2017 1508   GLUCOSEU NEGATIVE 06/06/2017 1508   HGBUR SMALL (A) 06/06/2017 1508   BILIRUBINUR NEGATIVE 06/06/2017 1508   KETONESUR NEGATIVE 06/06/2017 1508   PROTEINUR <30 (A) 09/11/2017 1300   UROBILINOGEN 0.2 07/06/2011 1221   NITRITE NEGATIVE 06/06/2017 1508   LEUKOCYTESUR NEGATIVE 06/06/2017 1508   Sepsis Labs: _0 (procalcitonin:4,lacticidven:4) )No results found for this or any previous visit (from the past 240 hour(s)).       Radiology Studies: Dg Chest 2 View  Result Date: 10/15/2017 CLINICAL DATA:  77 year old male with nausea and diarrhea for 1 week. Dehydration. Undergoing lung cancer treatment. EXAM: CHEST - 2 VIEW COMPARISON:  Staging CT chest abdomen pelvis 09/07/2017, and earlier. FINDINGS: Semi upright AP and lateral views of the chest. Stable right chest porta cath, accessed. Radiographically regressed right hilar region mass since February. Other mediastinal contours remain stable. No pneumothorax, pulmonary edema or pleural effusion. Asymmetric streaky and reticular opacity in the left lower lung appears stable. No new pulmonary opacity identified. No acute osseous  abnormality identified. Negative visible bowel gas pattern. No pneumoperitoneum along the diaphragm. IMPRESSION: 1. Radiographic regression of right hilar/lung mass since February. Underlying chronic lung disease. 2. No new cardiopulmonary abnormality. Electronically Signed   By: Genevie Ann M.D.   On: 10/15/2017 17:39      Scheduled Meds: . aspirin EC  81 mg Oral QHS  . calcium carbonate  1 tablet Oral Q breakfast  . diphenoxylate-atropine  1 tablet Oral QID  . folic acid  1 mg Oral Daily  . insulin aspart  0-5 Units Subcutaneous QHS  . insulin aspart  0-9 Units Subcutaneous TID WC  . insulin aspart  3 Units Subcutaneous TID WC  . Melatonin  5 mg Oral QHS  . mirtazapine  30 mg Oral QHS  . mometasone-formoterol  2 puff Inhalation BID  . multivitamin  1 tablet Oral QHS  . nystatin   Topical TID  . pantoprazole  40 mg Oral Daily  . pravastatin  40 mg Oral QHS  . predniSONE  10 mg Oral Q breakfast  . sodium chloride flush  3 mL Intravenous Q12H  . tamsulosin  0.4 mg Oral q morning - 10a   Continuous Infusions: . sodium chloride 75 mL/hr at 10/16/17 1050  . sodium chloride       LOS: 0 days    Time spent in minutes: 35    Debbe Odea, MD [facility] Pager: [email or web address] Password [email or web address] 10/16/2017, 1:24 PM

## 2017-10-17 DIAGNOSIS — G9341 Metabolic encephalopathy: Secondary | ICD-10-CM | POA: Diagnosis not present

## 2017-10-17 DIAGNOSIS — N183 Chronic kidney disease, stage 3 (moderate): Secondary | ICD-10-CM

## 2017-10-17 DIAGNOSIS — C3491 Malignant neoplasm of unspecified part of right bronchus or lung: Secondary | ICD-10-CM

## 2017-10-17 DIAGNOSIS — N179 Acute kidney failure, unspecified: Secondary | ICD-10-CM | POA: Diagnosis not present

## 2017-10-17 DIAGNOSIS — R197 Diarrhea, unspecified: Secondary | ICD-10-CM | POA: Diagnosis not present

## 2017-10-17 LAB — GLUCOSE, CAPILLARY: GLUCOSE-CAPILLARY: 96 mg/dL (ref 65–99)

## 2017-10-17 MED ORDER — MUPIROCIN 2 % EX OINT: 1.0000 "application " | TOPICAL_OINTMENT | Freq: Two times a day (BID) | CUTANEOUS | Status: DC

## 2017-10-17 MED ORDER — CHLORHEXIDINE GLUCONATE CLOTH 2 % EX PADS: 6.0000 | MEDICATED_PAD | Freq: Every day | CUTANEOUS | Status: DC

## 2017-10-17 MED ORDER — ENSURE ENLIVE PO LIQD: 237.0000 mL | Freq: Two times a day (BID) | ORAL | 12 refills | Status: AC

## 2017-10-17 MED ORDER — HEPARIN SOD (PORK) LOCK FLUSH 100 UNIT/ML IV SOLN
500.0000 [IU] | INTRAVENOUS | Status: AC | PRN
  Administered 2017-10-17: 500 [IU]

## 2017-10-17 NOTE — Care Management Note (Signed)
Case Management Note  Patient Details  Name: SERGI GELLNER MRN: [id number] Date of Birth: 03-02-1941  Subjective/Objective:      discharge              Action/Plan: No need for hhc  Or dme at time of dc  Expected Discharge Date:  10/17/17               Expected Discharge Plan:  Home/Self Care  In-House Referral:     Discharge planning Services  CM Consult  Post Acute Care Choice:    Choice offered to:     DME Arranged:    DME Agency:     HH Arranged:    [place] Agency:     Status of Service:  Completed, signed off  If discussed at [employer] of Stay Meetings, dates discussed:    Additional Comments:  Leeroy Cha, RN 10/17/2017, 8:55 AM

## 2017-10-17 NOTE — Discharge Summary (Signed)
Physician Discharge Summary  Mark Howell RFX:[id number] DOB: 12-29-1940 DOA: 10/15/2017  PCP: [name], MD  Admit date: 10/15/2017 Discharge date: 10/17/2017  Admitted From: home  Disposition:  home   Recommendations for Outpatient Follow-up:  1. F/u on oral intake and diarrhea  Discharge Condition:  stable   CODE STATUS:  Full code   Diet recommendation:  Regular diet Consultations:  none    Discharge Diagnoses:  Principal Problem:   Diarrhea Active Problems:   Adenocarcinoma of right lung, stage 4 (HCC)   Secondary malignant neoplasm of adrenal gland (HCC)   AKI (acute kidney injury) ([place])   CKD (chronic kidney disease), stage III (HCC)   Hypomagnesemia   Thrombocytopenia (HCC)   Normocytic anemia      Brief Summary: Mark Howell 77 y.o.malewith medical history significant ofpast medical history of metastatic lung cancer stage IV on chemotherapy-last chemotherapy about a week ago, radiation therapy the day before admission, diabetes mellitus who comes into the hospital for fatigue weakness diarrhea x 1 wk. Presented after seeing his oncologist on the day of admission. In the oncologist office labs showed a low magnesium.  As he had some cough but no fevers, he relate he had his cough for several weeks he is supposed to see a pulmonologist this week for evaluation of his cough.    Hospital Course:  Principal Problem:   Diarrhea - abdomen is benign- no imaging done, no fevers or leukocytosis to suggest an infection   - started on Lomotil QID - has Imodium and Lomotil ordered at home for diarrhea - stopped Ensure - diarrhea has resolved   Active Problems:   Hypomagnesemia - due to above & has been replaced  AKI- CKD 3 - baseline Cr about 1.3- admitted with Cr of 2.21 - due to diarrhea and is improving   Hematuria- BPH - patient passed a blood clot tyesterday, otherwise urine is clear   - cont Flomax  Chronic thrombocytopenia - follow    Lung CA with metastasis - due for last round of chemo in a couple of weeks- Dr Julien Nordmann -   has received radiation of adrenal met  DM2 - A1c 7.4- cont Glipizide  Chronic daily prednisone - 10 mg daily      Discharge Exam: Vitals:   10/16/17 2012 10/17/17 0535  BP: 107/73 116/66  Pulse: 97 73  Resp: 18 16  Temp: 98.2 F (36.8 C) 98.5 F (36.9 C)  SpO2: 100% 99%   Vitals:   10/16/17 1459 10/16/17 1949 10/16/17 2012 10/17/17 0535  BP: 107/69  107/73 116/66  Pulse: 94  97 73  Resp: _0 Temp: 97.9 F (36.6 C)  98.2 F (36.8 C) 98.5 F (36.9 C)  TempSrc: Oral   Oral  SpO2: 99% 97% 100% 99%  Weight:    74 kg (163 lb 2.3 oz)    General: Pt is alert, awake, not in acute distress Cardiovascular: RRR, S1/S2 +, no rubs, no gallops Respiratory: CTA bilaterally, no wheezing, no rhonchi Abdominal: Soft, NT, ND, bowel sounds + Extremities: no edema, no cyanosis   Discharge Instructions  Discharge Instructions    Diet general   Complete by:  As directed    Regular diet   Increase activity slowly   Complete by:  As directed      Allergies as of 10/17/2017      Reactions   Hydrocodone Rash   Oxycodone Rash      Medication List  TAKE these medications   aspirin EC 81 MG tablet Take 81 mg by mouth at bedtime.   blood glucose meter kit and supplies Kit Dispense based on patient and insurance preference. Use up to four times daily as directed. (FOR ICD-9 250.00, 250.01).   calcium carbonate 500 MG chewable tablet Commonly known as:  TUMS - dosed in mg elemental calcium Chew 1 tablet by mouth daily.   Cetirizine HCl 10 MG Tbdp Take 1 tablet by mouth daily.   CO Q 10 PO Take 1 tablet by mouth daily.   Continuous Blood Gluc Sensor Misc 1 each by Does not apply route as directed. Use as directed every 10 days. May dispense FreeStyle [employer] or similar.   dexamethasone 4 MG tablet Commonly known as:  DECADRON TAKE 1 TABLET TWICE DAILY  FOR 3 DAYS (DAY BEFORE, DAY OF, AND DAY AFTER CHEMO)   diphenoxylate-atropine 2.5-0.025 MG tablet Commonly known as:  LOMOTIL Take 2 tablets by mouth 4 (four) times daily as needed for diarrhea or loose stools.   feeding supplement (ENSURE ENLIVE) Liqd Take 237 mLs by mouth 2 (two) times daily between meals.   fluticasone-salmeterol 115-21 MCG/ACT inhaler Commonly known as:  ADVAIR HFA Inhale 2 puffs into the lungs 2 (two) times daily.   folic acid 1 MG tablet Commonly known as:  FOLVITE TAKE 1 TABLET DAILY What changed:  Another medication with the same name was removed. Continue taking this medication, and follow the directions you see here.   GLIPIZIDE XL 5 MG 24 hr tablet Generic drug:  glipiZIDE Take 5 mg by mouth daily with breakfast. What changed:  Another medication with the same name was removed. Continue taking this medication, and follow the directions you see here.   IMODIUM A-D 2 MG tablet Generic drug:  loperamide Take 2 mg by mouth daily as needed for diarrhea or loose stools.   lidocaine 2 % solution Commonly known as:  XYLOCAINE Use as directed 20 mLs in the mouth or throat every 3 (three) hours as needed for mouth pain.   lidocaine-prilocaine cream Commonly known as:  EMLA Apply 1 application topically as needed.   LORazepam 0.5 MG tablet Commonly known as:  ATIVAN Take 1 tablet (0.5 mg total) by mouth 2 (two) times daily as needed for anxiety.   Lutein 10 MG Tabs Take 10 mg by mouth at bedtime.   magic mouthwash Soln Take 5 mLs by mouth 4 (four) times daily as needed for mouth pain. What changed:  additional instructions   Melatonin 5 MG Tabs Take 5 mg by mouth at bedtime.   METHYL B-12 PO Take 1 tablet by mouth daily.   mirtazapine 30 MG tablet Commonly known as:  REMERON Take 30 mg by mouth at bedtime.   multivitamin with minerals Tabs tablet Take 1 tablet by mouth daily.   nystatin powder Commonly known as:  MYCOSTATIN/NYSTOP Apply  topically 3 (three) times daily. Apply to groin rash.   pantoprazole 40 MG tablet Commonly known as:  PROTONIX Take 40 mg by mouth daily.   pravastatin 40 MG tablet Commonly known as:  PRAVACHOL Take 40 mg by mouth at bedtime.   predniSONE 5 MG tablet Commonly known as:  DELTASONE Take 10 mg by mouth daily with breakfast.   prochlorperazine 10 MG tablet Commonly known as:  COMPAZINE Take 1 tablet (10 mg total) by mouth every 6 (six) hours as needed for nausea or vomiting.   tamsulosin 0.4 MG Caps capsule Commonly  known as:  FLOMAX TAKE (1) CAPSULE DAILY, START 4 DAYS BEFORE PROCEDURE What changed:  See the new instructions.   NASACORT ALLERGY 24HR 55 MCG/ACT Aero nasal inhaler Generic drug:  triamcinolone Place 1 spray into the nose at bedtime.   triamcinolone 55 MCG/ACT Aero nasal inhaler Commonly known as:  NASACORT Place 1 spray into the nose at bedtime.   VENTOLIN HFA 108 (90 Base) MCG/ACT inhaler Generic drug:  albuterol Inhale 2 puffs into the lungs every 6 (six) hours as needed for wheezing or shortness of breath.       Allergies  Allergen Reactions  . Hydrocodone Rash  . Oxycodone Rash     Procedures/Studies:   Dg Chest 2 View  Result Date: 10/15/2017 CLINICAL DATA:  77 year old male with nausea and diarrhea for 1 week. Dehydration. Undergoing lung cancer treatment. EXAM: CHEST - 2 VIEW COMPARISON:  Staging CT chest abdomen pelvis 09/07/2017, and earlier. FINDINGS: Semi upright AP and lateral views of the chest. Stable right chest porta cath, accessed. Radiographically regressed right hilar region mass since February. Other mediastinal contours remain stable. No pneumothorax, pulmonary edema or pleural effusion. Asymmetric streaky and reticular opacity in the left lower lung appears stable. No new pulmonary opacity identified. No acute osseous abnormality identified. Negative visible bowel gas pattern. No pneumoperitoneum along the diaphragm. IMPRESSION: 1.  Radiographic regression of right hilar/lung mass since February. Underlying chronic lung disease. 2. No new cardiopulmonary abnormality. Electronically Signed   By: Genevie Ann M.D.   On: 10/15/2017 17:39     The results of significant diagnostics from this hospitalization (including imaging, microbiology, ancillary and laboratory) are listed below for reference.     Microbiology: Recent Results (from the past 240 hour(s))  MRSA PCR Screening     Status: Abnormal   Collection Time: 10/16/17 10:51 AM  Result Value Ref Range Status   MRSA by PCR POSITIVE (A) NEGATIVE Final    Comment:        The GeneXpert MRSA Assay (FDA approved for NASAL specimens only), is one component of a comprehensive MRSA colonization surveillance program. It is not intended to diagnose MRSA infection nor to guide or monitor treatment for MRSA infections. RESULT CALLED TO, READ BACK BY AND VERIFIED WITH: V.JACKSON AT 2041 ON 10/16/17 BY N.THOMPSON Performed at [facility], [place] [address], [place], [place] [address]      Labs: BNP (last 3 results) Recent Labs    06/08/17 0418  BNP 119.1*   Basic Metabolic Panel: Recent Labs  Lab 10/15/17 1320 10/16/17 0204 10/16/17 1200  NA 134* 136 137  K 4.9 4.5 4.6  CL 98 105 106  CO2 22 18* 20*  GLUCOSE 232* 128* 179*  BUN 65* 61* 57*  CREATININE 2.21* 1.79* 1.73*  CALCIUM 9.9 8.6* 8.6*  MG 1.1* 2.2 2.1   Liver Function Tests: Recent Labs  Lab 10/15/17 1320 10/16/17 0204  AST 33 29  ALT 21 21  ALKPHOS 105 74  BILITOT 0.4 0.4  PROT 7.8 6.5  ALBUMIN 3.6 3.2*   No results for input(s): LIPASE, AMYLASE in the last 168 hours. No results for input(s): AMMONIA in the last 168 hours. CBC: Recent Labs  Lab 10/15/17 1320  WBC 7.1  NEUTROABS 5.3  HGB 11.7*  HCT 35.2*  MCV 94.7  PLT 82*   Cardiac Enzymes: No results for input(s): CKTOTAL, CKMB, CKMBINDEX, TROPONINI in the last 168 hours. BNP: Invalid input(s):  POCBNP CBG: Recent Labs  Lab 10/16/17 0733 10/16/17  1218 10/16/17 1701 10/16/17 2013 10/17/17 0738  GLUCAP 100* 143* 117* 162* 96   D-Dimer No results for input(s): DDIMER in the last 72 hours. Hgb A1c Recent Labs    10/16/17 0212  HGBA1C 7.4*   Lipid Profile No results for input(s): CHOL, HDL, LDLCALC, TRIG, CHOLHDL, LDLDIRECT in the last 72 hours. Thyroid function studies No results for input(s): TSH, T4TOTAL, T3FREE, THYROIDAB in the last 72 hours.  Invalid input(s): FREET3 Anemia work up No results for input(s): VITAMINB12, FOLATE, FERRITIN, TIBC, IRON, RETICCTPCT in the last 72 hours. Urinalysis    Component Value Date/Time   COLORURINE YELLOW 10/16/2017 [place] 10/16/2017 1208   LABSPEC 1.015 10/16/2017 1208   PHURINE 5.0 10/16/2017 1208   GLUCOSEU NEGATIVE 10/16/2017 1208   HGBUR NEGATIVE 10/16/2017 1208   BILIRUBINUR NEGATIVE 10/16/2017 1208   KETONESUR NEGATIVE 10/16/2017 1208   PROTEINUR NEGATIVE 10/16/2017 1208   UROBILINOGEN 0.2 07/06/2011 1221   NITRITE NEGATIVE 10/16/2017 1208   LEUKOCYTESUR NEGATIVE 10/16/2017 1208   Sepsis Labs Invalid input(s): PROCALCITONIN,  WBC,  LACTICIDVEN Microbiology Recent Results (from the past 240 hour(s))  MRSA PCR Screening     Status: Abnormal   Collection Time: 10/16/17 10:51 AM  Result Value Ref Range Status   MRSA by PCR POSITIVE (A) NEGATIVE Final    Comment:        The GeneXpert MRSA Assay (FDA approved for NASAL specimens only), is one component of a comprehensive MRSA colonization surveillance program. It is not intended to diagnose MRSA infection nor to guide or monitor treatment for MRSA infections. RESULT CALLED TO, READ BACK BY AND VERIFIED WITH: V.JACKSON AT 2041 ON 10/16/17 BY N.THOMPSON Performed at [facility], [place] [address], [place], [place] [address]      Time coordinating discharge in minutes: 95  SIGNED:   Debbe Odea, MD  [facility] 10/17/2017, 9:24 AM Pager   If 7PM-7AM, please contact night-coverage [email or web address] Password [email or web address]

## 2017-10-17 NOTE — Progress Notes (Signed)
Discharge instructions and medication list discussed with patient. AVF given patient. All questions answered.

## 2017-10-17 NOTE — Progress Notes (Signed)
MD in room cancelled all lab work at this time

## 2017-10-17 NOTE — Progress Notes (Signed)
This shift there is no report of blood or clots passing in the urine. Urine yellow and clear. Will continue to monitor

## 2017-10-22 NOTE — Progress Notes (Signed)
  Radiation Oncology         [phone] ________________________________  Name: Mark Howell MRN: [id number]  Date: 10/11/2017  DOB: 1941-03-18  End of Treatment Note  Diagnosis:   77 y.o. 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  He experienced nocturia and diarrhea over the course of SBRT treatment, but otherwise many of his symptoms are likely related to systemic therapy. He does have a rash in his groin area and will contact his primary care physician concerning this. He did experience significant fatigue related to his combined treatments.  Plan: The patient has completed radiation treatment. The patient will return to radiation oncology clinic for routine followup in one month. I advised them to call or return sooner if they have any questions or concerns related to their recovery or treatment.  -----------------------------------  Blair Promise, PhD, MD  This document serves as a record of services personally performed by Gery Pray, MD. It was created on his behalf by Rae Lips, a trained medical scribe. The creation of this record is based on the scribe's personal observations and the provider's statements to them. This document has been checked and approved by the attending provider.

## 2017-10-23 ENCOUNTER — Telehealth: Payer: Self-pay | Admitting: Internal Medicine

## 2017-10-23 ENCOUNTER — Inpatient Hospital Stay (HOSPITAL_BASED_OUTPATIENT_CLINIC_OR_DEPARTMENT_OTHER): Payer: Medicare Other | Admitting: Internal Medicine

## 2017-10-23 ENCOUNTER — Inpatient Hospital Stay: Payer: Medicare Other

## 2017-10-23 ENCOUNTER — Other Ambulatory Visit: Payer: Self-pay | Admitting: Internal Medicine

## 2017-10-23 ENCOUNTER — Encounter: Payer: Self-pay | Admitting: Internal Medicine

## 2017-10-23 VITALS — BP 120/81 | HR 98

## 2017-10-23 DIAGNOSIS — C7972 Secondary malignant neoplasm of left adrenal gland: Secondary | ICD-10-CM | POA: Diagnosis not present

## 2017-10-23 DIAGNOSIS — J449 Chronic obstructive pulmonary disease, unspecified: Secondary | ICD-10-CM | POA: Diagnosis not present

## 2017-10-23 DIAGNOSIS — C7971 Secondary malignant neoplasm of right adrenal gland: Secondary | ICD-10-CM | POA: Diagnosis not present

## 2017-10-23 DIAGNOSIS — C3491 Malignant neoplasm of unspecified part of right bronchus or lung: Secondary | ICD-10-CM

## 2017-10-23 DIAGNOSIS — Z87891 Personal history of nicotine dependence: Secondary | ICD-10-CM | POA: Diagnosis not present

## 2017-10-23 DIAGNOSIS — Z95828 Presence of other vascular implants and grafts: Secondary | ICD-10-CM

## 2017-10-23 DIAGNOSIS — R634 Abnormal weight loss: Secondary | ICD-10-CM | POA: Diagnosis not present

## 2017-10-23 DIAGNOSIS — Z79899 Other long term (current) drug therapy: Secondary | ICD-10-CM | POA: Diagnosis not present

## 2017-10-23 DIAGNOSIS — C3401 Malignant neoplasm of right main bronchus: Secondary | ICD-10-CM

## 2017-10-23 DIAGNOSIS — Z5112 Encounter for antineoplastic immunotherapy: Secondary | ICD-10-CM | POA: Diagnosis not present

## 2017-10-23 DIAGNOSIS — C3490 Malignant neoplasm of unspecified part of unspecified bronchus or lung: Secondary | ICD-10-CM

## 2017-10-23 LAB — CBC WITH DIFFERENTIAL (CANCER CENTER ONLY)
BASOS ABS: 0 10*3/uL (ref 0.0–0.1)
BASOS PCT: 0 %
Eosinophils Absolute: 0 10*3/uL (ref 0.0–0.5)
Eosinophils Relative: 0 %
HCT: 29.2 % — ABNORMAL LOW (ref 38.4–49.9)
Hemoglobin: 9.8 g/dL — ABNORMAL LOW (ref 13.0–17.1)
Lymphocytes Relative: 2 %
Lymphs Abs: 0.2 10*3/uL — ABNORMAL LOW (ref 0.9–3.3)
MCH: 31.5 pg (ref 27.2–33.4)
MCHC: 33.4 g/dL (ref 32.0–36.0)
MCV: 94.3 fL (ref 79.3–98.0)
MONO ABS: 0.7 10*3/uL (ref 0.1–0.9)
Monocytes Relative: 9 %
NEUTROS PCT: 89 %
Neutro Abs: 6.7 10*3/uL — ABNORMAL HIGH (ref 1.5–6.5)
Platelet Count: 98 10*3/uL — ABNORMAL LOW (ref 140–400)
RBC: 3.1 MIL/uL — ABNORMAL LOW (ref 4.20–5.82)
RDW: 23 % — AB (ref 11.0–14.6)
WBC Count: 7.6 10*3/uL (ref 4.0–10.3)

## 2017-10-23 LAB — CMP (CANCER CENTER ONLY)
ALK PHOS: 98 U/L (ref 38–126)
ALT: 24 U/L (ref 0–44)
AST: 38 U/L (ref 15–41)
Albumin: 3.3 g/dL — ABNORMAL LOW (ref 3.5–5.0)
Anion gap: 13 (ref 5–15)
BILIRUBIN TOTAL: 0.4 mg/dL (ref 0.3–1.2)
BUN: 37 mg/dL — AB (ref 8–23)
CHLORIDE: 99 mmol/L (ref 98–111)
CO2: 22 mmol/L (ref 22–32)
CREATININE: 1.41 mg/dL — AB (ref 0.61–1.24)
Calcium: 9.3 mg/dL (ref 8.9–10.3)
GFR, EST AFRICAN AMERICAN: 54 mL/min — AB (ref >60)
GFR, EST NON AFRICAN AMERICAN: 47 mL/min — AB (ref >60)
Glucose, Bld: 224 mg/dL — ABNORMAL HIGH (ref 70–99)
POTASSIUM: 5.6 mmol/L — AB (ref 3.5–5.1)
SODIUM: 134 mmol/L — AB (ref 135–145)
TOTAL PROTEIN: 7.6 g/dL (ref 6.5–8.1)

## 2017-10-23 MED ORDER — PROCHLORPERAZINE MALEATE 10 MG PO TABS
ORAL_TABLET | ORAL | Status: AC
  Filled 2017-10-23: qty 1

## 2017-10-23 MED ORDER — HEPARIN SOD (PORK) LOCK FLUSH 100 UNIT/ML IV SOLN
500.0000 [IU] | Freq: Once | INTRAVENOUS | Status: AC | PRN
  Administered 2017-10-23: 500 [IU]
  Filled 2017-10-23: qty 5

## 2017-10-23 MED ORDER — SODIUM CHLORIDE 0.9% FLUSH
10.0000 mL | Freq: Once | INTRAVENOUS | Status: AC
  Administered 2017-10-23: 10 mL
  Filled 2017-10-23: qty 10

## 2017-10-23 MED ORDER — SODIUM CHLORIDE 0.9 % IV SOLN
410.0000 mg/m2 | Freq: Once | INTRAVENOUS | Status: AC
  Administered 2017-10-23: 800 mg via INTRAVENOUS
  Filled 2017-10-23: qty 20

## 2017-10-23 MED ORDER — PROCHLORPERAZINE MALEATE 10 MG PO TABS
10.0000 mg | ORAL_TABLET | Freq: Once | ORAL | Status: AC
  Administered 2017-10-23: 10 mg via ORAL

## 2017-10-23 MED ORDER — SODIUM CHLORIDE 0.9% FLUSH
10.0000 mL | INTRAVENOUS | Status: DC | PRN
  Administered 2017-10-23: 10 mL
  Filled 2017-10-23: qty 10

## 2017-10-23 MED ORDER — DRONABINOL 2.5 MG PO CAPS: 2.5000 mg | ORAL_CAPSULE | Freq: Two times a day (BID) | ORAL | 0 refills | Status: AC

## 2017-10-23 MED ORDER — SODIUM CHLORIDE 0.9 % IV SOLN
16.0000 mg/kg | Freq: Once | INTRAVENOUS | Status: AC
  Administered 2017-10-23: 1200 mg via INTRAVENOUS
  Filled 2017-10-23: qty 48

## 2017-10-23 MED ORDER — SODIUM CHLORIDE 0.9 % IV SOLN: Freq: Once | INTRAVENOUS | Status: DC

## 2017-10-23 MED ORDER — SODIUM CHLORIDE 0.9 % IV SOLN
Freq: Once | INTRAVENOUS | Status: AC
  Administered 2017-10-23: 11:00:00 via INTRAVENOUS

## 2017-10-23 NOTE — Progress Notes (Signed)
MD [name] reviewed abnormal CBC & CMP from 6/25, ok to treat today.  Also ok to use urine protein from 6/18 for avastin.

## 2017-10-23 NOTE — Progress Notes (Signed)
[facility] Telephone:([phone]) [phone]   Fax:(336) [phone]  OFFICE PROGRESS NOTE  [name], MD [place] [address]  DIAGNOSIS: Metastatic non-small cell lung cancer, adenocarcinoma initially diagnosed as Unresectable stage IIA (T1a, N1, M0) non-small cell lung cancer, adenocarcinoma presented with a right hilar mass and right hilar adenopathy diagnosed in September 2017. [facility]: Negative for EGFR, ALK, ROS 1 and BRAF mutations.  PRIOR THERAPY:  1) Status post concurrent chemoradiation with weekly carboplatin for AUC of 2 and paclitaxel 45 MG/M2 for 7 weeks. 2) Systemic chemotherapy with carboplatin for AUC of 5, Alimta 500 MG/M2 and Avastin 15 MG/KG every 3 weeks. First dose 01/08/2017. Status post 6 cycles.  Last dose was given April 30, 2002 stable disease. 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 Sondra Come   CURRENT THERAPY: Maintenance treatment with Alimta 400 mg/M2 and Avastin 15 mg/KG every 3 weeks.  First dose June 11, 2017.  Status post 5 cycles.   INTERVAL HISTORY: Mark Howell 77 y.o. male returns to the clinic today for follow-up visit accompanied by his wife.  The patient is feeling fine today with no specific complaints except for the fatigue and weight loss.  He has lack of appetite.  He is currently on Remeron by his primary care physician.  He is also on prednisone 10 mg p.o. daily for his arthritis.  The patient denied having any chest pain, shortness of breath but continues to have intermittent episodes of cough with no hemoptysis.  He has no nausea, vomiting, diarrhea or constipation.  He completed a course of palliative radiotherapy to the bilateral adrenal gland lesion under the care of Dr. Sondra Come.  He is here today for evaluation before starting cycle #6 of his maintenance treatment with Alimta and Avastin.   MEDICAL HISTORY: Past Medical History:  Diagnosis  Date  . Arthritis   . Complication of anesthesia    problems voiding post op  . Diabetes mellitus without complication ([place])   . Full dentures   . GERD (gastroesophageal reflux disease)   . History of radiation therapy 01/25/16-02/28/16   right lung 45 Gy  . History of radiation therapy 04/17/16-05/02/16   right lung boost 20 Gy in 10 fractions cumulative dose 65 gray  . HOH (hard of hearing)   . Hyperlipidemia   . lung ca dx'd 01/2016  . Lung mass 12/30/2015  . Odynophagia 04/06/2016  . Psoriatic arthritis ([place])   . Snores   . Wears glasses     ALLERGIES:  is allergic to hydrocodone and oxycodone.  MEDICATIONS:  Current Outpatient Medications  Medication Sig Dispense Refill  . albuterol (VENTOLIN HFA) 108 (90 Base) MCG/ACT inhaler Inhale 2 puffs into the lungs every 6 (six) hours as needed for wheezing or shortness of breath.     Marland Kitchen aspirin EC 81 MG tablet Take 81 mg by mouth at bedtime.     . blood glucose meter kit and supplies KIT Dispense based on patient and insurance preference. Use up to four times daily as directed. (FOR ICD-9 250.00, 250.01). 1 each 0  . calcium carbonate (TUMS - DOSED IN MG ELEMENTAL CALCIUM) 500 MG chewable tablet Chew 1 tablet by mouth daily.    . Cetirizine HCl 10 MG TBDP Take 1 tablet by mouth daily.     . Coenzyme Q10 (CO Q 10 PO) Take 1 tablet by mouth daily.    . Continuous Blood Gluc  Sensor MISC 1 each by Does not apply route as directed. Use as directed every 10 days. May dispense FreeStyle [employer] or similar. 2 each 2  . dexamethasone (DECADRON) 4 MG tablet TAKE 1 TABLET TWICE DAILY FOR 3 DAYS (DAY BEFORE, DAY OF, AND DAY AFTER CHEMO) 40 tablet 0  . diphenoxylate-atropine (LOMOTIL) 2.5-0.025 MG tablet Take 2 tablets by mouth 4 (four) times daily as needed for diarrhea or loose stools. 40 tablet 1  . feeding supplement, ENSURE ENLIVE, (ENSURE ENLIVE) LIQD Take 237 mLs by mouth 2 (two) times daily between meals. 60 Bottle 12  .  fluticasone-salmeterol (ADVAIR HFA) 115-21 MCG/ACT inhaler Inhale 2 puffs into the lungs 2 (two) times daily. 1 Inhaler 12  . folic acid (FOLVITE) 1 MG tablet TAKE 1 TABLET DAILY 30 tablet 0  . GLIPIZIDE XL 5 MG 24 hr tablet Take 5 mg by mouth daily with breakfast.     . lidocaine (XYLOCAINE) 2 % solution Use as directed 20 mLs in the mouth or throat every 3 (three) hours as needed for mouth pain. 100 mL 2  . lidocaine-prilocaine (EMLA) cream Apply 1 application topically as needed. 30 g 0  . loperamide (IMODIUM A-D) 2 MG tablet Take 2 mg by mouth daily as needed for diarrhea or loose stools.    Marland Kitchen LORazepam (ATIVAN) 0.5 MG tablet Take 1 tablet (0.5 mg total) by mouth 2 (two) times daily as needed for anxiety. 30 tablet 0  . Lutein 10 MG TABS Take 10 mg by mouth at bedtime.     . magic mouthwash SOLN Take 5 mLs by mouth 4 (four) times daily as needed for mouth pain. (Patient taking differently: Take 5 mLs by mouth 4 (four) times daily as needed for mouth pain. SWISH AND SPIT OUT) 240 mL 2  . Melatonin 5 MG TABS Take 5 mg by mouth at bedtime.    . Methylcobalamin (METHYL B-12 PO) Take 1 tablet by mouth daily.    . mirtazapine (REMERON) 30 MG tablet Take 30 mg by mouth at bedtime.     . Multiple Vitamin (MULITIVITAMIN WITH MINERALS) TABS Take 1 tablet by mouth daily.    Marland Kitchen nystatin (MYCOSTATIN/NYSTOP) powder Apply topically 3 (three) times daily. Apply to groin rash. 30 g 0  . pantoprazole (PROTONIX) 40 MG tablet Take 40 mg by mouth daily.     . pravastatin (PRAVACHOL) 40 MG tablet Take 40 mg by mouth at bedtime.     . predniSONE (DELTASONE) 5 MG tablet Take 10 mg by mouth daily with breakfast.     . prochlorperazine (COMPAZINE) 10 MG tablet Take 1 tablet (10 mg total) by mouth every 6 (six) hours as needed for nausea or vomiting. 30 tablet 0  . tamsulosin (FLOMAX) 0.4 MG CAPS capsule TAKE (1) CAPSULE DAILY, START 4 DAYS BEFORE PROCEDURE (Patient taking differently: take 1 capsule by mouth every  morning) 30 capsule 2  . triamcinolone (NASACORT ALLERGY 24HR) 55 MCG/ACT AERO nasal inhaler Place 1 spray into the nose at bedtime.    . triamcinolone (NASACORT) 55 MCG/ACT AERO nasal inhaler Place 1 spray into the nose at bedtime.      No current facility-administered medications for this visit.     SURGICAL HISTORY:  Past Surgical History:  Procedure Laterality Date  . COLONOSCOPY    . HEMORRHOID SURGERY N/A 10/01/2013   Procedure: EXAM UNDER ANESTHESIA  AND EXCISIONAL HEMORRHOIDECTOMY WITH HEMORRHOID BANDING X 2;  Surgeon: Gayland Curry, MD;  Location: [facility];  Service: General;  Laterality: N/A;  . INGUINAL HERNIA REPAIR  01/04/2012   Procedure: LAPAROSCOPIC INGUINAL HERNIA;  Surgeon: Gayland Curry, MD,FACS;  Location: [facility];  Service: General;  Laterality: Right;  . IR FLUORO GUIDE PORT INSERTION RIGHT  01/02/2017  . IR US GUIDE VASC ACCESS RIGHT  01/02/2017  . VIDEO BRONCHOSCOPY WITH ENDOBRONCHIAL ULTRASOUND N/A 12/31/2015   Procedure: VIDEO BRONCHOSCOPY WITH ENDOBRONCHIAL ULTRASOUND transbronchial biopsy of node 10 R lymph node;  Surgeon: Grace Isaac, MD;  Location: [facility];  Service: Thoracic;  Laterality: N/A;    REVIEW OF SYSTEMS:  Constitutional: positive for anorexia, fatigue and weight loss Eyes: negative Ears, nose, mouth, throat, and face: negative Respiratory: positive for cough Cardiovascular: negative Gastrointestinal: negative Genitourinary:negative Integument/breast: negative Hematologic/lymphatic: negative Musculoskeletal:negative Neurological: negative Behavioral/Psych: negative Endocrine: negative Allergic/Immunologic: negative   PHYSICAL EXAMINATION: General appearance: alert, cooperative, fatigued and no distress Head: Normocephalic, without obvious abnormality, atraumatic Neck: no adenopathy, no JVD, supple, symmetrical, trachea midline and thyroid not enlarged, symmetric, no tenderness/mass/nodules Lymph nodes: Cervical,  supraclavicular, and axillary nodes normal. Resp: wheezes bilaterally Back: symmetric, no curvature. ROM normal. No CVA tenderness. Cardio: regular rate and rhythm, S1, S2 normal, no murmur, click, rub or gallop GI: soft, non-tender; bowel sounds normal; no masses,  no organomegaly Extremities: extremities normal, atraumatic, no cyanosis or edema Neurologic: Alert and oriented X 3, normal strength and tone. Normal symmetric reflexes. Normal coordination and gait  ECOG PERFORMANCE STATUS: 1 - Symptomatic but completely ambulatory  Blood pressure 100/78, pulse (!) 104, temperature 97.8 F (36.6 C), temperature source Oral, resp. rate 18, height 6' 1.5" (1.867 m), weight 161 lb 1.6 oz (73.1 kg), SpO2 99 %.  LABORATORY DATA: Lab Results  Component Value Date   WBC 7.6 10/23/2017   HGB 9.8 (L) 10/23/2017   HCT 29.2 (L) 10/23/2017   MCV 94.3 10/23/2017   PLT 98 (L) 10/23/2017      Chemistry      Component Value Date/Time   NA 137 10/16/2017 1200   NA 135 (L) 04/30/2017 0824   K 4.6 10/16/2017 1200   K 4.4 04/30/2017 0824   CL 106 10/16/2017 1200   CO2 20 (L) 10/16/2017 1200   CO2 19 (L) 04/30/2017 0824   BUN 57 (H) 10/16/2017 1200   BUN 28.0 (H) 04/30/2017 0824   CREATININE 1.73 (H) 10/16/2017 1200   CREATININE 2.21 (H) 10/15/2017 1320   CREATININE 1.5 (H) 04/30/2017 0824      Component Value Date/Time   CALCIUM 8.6 (L) 10/16/2017 1200   CALCIUM 7.6 (L) 04/30/2017 0824   ALKPHOS 74 10/16/2017 0204   ALKPHOS 88 04/30/2017 0824   AST 29 10/16/2017 0204   AST 33 10/15/2017 1320   AST 22 04/30/2017 0824   ALT 21 10/16/2017 0204   ALT 21 10/15/2017 1320   ALT 19 04/30/2017 0824   BILITOT 0.4 10/16/2017 0204   BILITOT 0.4 10/15/2017 1320   BILITOT 0.57 04/30/2017 0824       RADIOGRAPHIC STUDIES: Dg Chest 2 View  Result Date: 10/15/2017 CLINICAL DATA:  77 year old male with nausea and diarrhea for 1 week. Dehydration. Undergoing lung cancer treatment. EXAM: CHEST - 2  VIEW COMPARISON:  Staging CT chest abdomen pelvis 09/07/2017, and earlier. FINDINGS: Semi upright AP and lateral views of the chest. Stable right chest porta cath, accessed. Radiographically regressed right hilar region mass since February. Other mediastinal contours remain stable. No pneumothorax, pulmonary edema or pleural effusion. Asymmetric streaky and  reticular opacity in the left lower lung appears stable. No new pulmonary opacity identified. No acute osseous abnormality identified. Negative visible bowel gas pattern. No pneumoperitoneum along the diaphragm. IMPRESSION: 1. Radiographic regression of right hilar/lung mass since February. Underlying chronic lung disease. 2. No new cardiopulmonary abnormality. Electronically Signed   By: Genevie Ann M.D.   On: 10/15/2017 17:39    ASSESSMENT AND PLAN:  This is a very pleasant 77 years old white male with unresectable a stage II a non-small cell lung cancer, adenocarcinoma presented with right hilar mass status post a course of concurrent chemoradiation with weekly carboplatin and paclitaxel. He has partial response to this treatment. The patient has been observation for the last few months. The patient developed disease progression and he was started on systemic chemotherapy with carboplatin, Alimta and Avastin status post 6 cycles. The patient tolerated the previous treatment fairly well except for increasing fatigue. The patient is currently undergoing maintenance treatment with Alimta and Avastin status post 5 cycles.   I recommended for the patient to proceed with cycle #6 today as a schedule.  I will see him back for follow-up visit in 3 weeks for evaluation after repeating CT scan of the chest, abdomen and pelvis without contrast for restaging of his disease. For COPD and shortness of breath, I would refer the patient to pulmonary medicine for evaluation and management. For the weight loss, I will start the patient on Marinol 2.5 mg p.o. twice daily.   He will also continue his treatment with Remeron and prednisone. He was advised to call immediately if he has any concerning symptoms in the interval. The patient voices understanding of current disease status and treatment options and is in agreement with the current care plan. All questions were answered. The patient knows to call the clinic with any problems, questions or concerns. We can certainly see the patient much sooner if necessary.  Disclaimer: This note was dictated with voice recognition software. Similar sounding words can inadvertently be transcribed and may not be corrected upon review.

## 2017-10-23 NOTE — Telephone Encounter (Signed)
3 cycles already scheduled per 6/25 los - no additional appt to add.

## 2017-10-23 NOTE — Patient Instructions (Signed)
[facility] Discharge Instructions for Patients Receiving Chemotherapy  Today you received the following chemotherapy agents: Alimta & Avastin To help prevent nausea and vomiting after your treatment, we encourage you to take your nausea medication as prescribed.  If you develop nausea and vomiting that is not controlled by your nausea medication, call the clinic.   BELOW ARE SYMPTOMS THAT SHOULD BE REPORTED IMMEDIATELY:  *FEVER GREATER THAN 100.5 F  *CHILLS WITH OR WITHOUT FEVER  NAUSEA AND VOMITING THAT IS NOT CONTROLLED WITH YOUR NAUSEA MEDICATION  *UNUSUAL SHORTNESS OF BREATH  *UNUSUAL BRUISING OR BLEEDING  TENDERNESS IN MOUTH AND THROAT WITH OR WITHOUT PRESENCE OF ULCERS  *URINARY PROBLEMS  *BOWEL PROBLEMS  UNUSUAL RASH Items with * indicate a potential emergency and should be followed up as soon as possible.  Feel free to call the clinic should you have any questions or concerns. The clinic phone number is [phone].  Please show the [facility] at check-in to the Emergency Department and triage nurse.

## 2017-10-24 NOTE — Progress Notes (Signed)
PA for Dronabinol 2.5 mg has been submitted.

## 2017-11-03 ENCOUNTER — Other Ambulatory Visit: Payer: Self-pay

## 2017-11-03 ENCOUNTER — Encounter (HOSPITAL_COMMUNITY): Payer: Self-pay

## 2017-11-03 ENCOUNTER — Emergency Department (HOSPITAL_COMMUNITY)
Admission: EM | Admit: 2017-11-03 | Discharge: 2017-11-29 | Disposition: E | Payer: Medicare Other | Attending: Emergency Medicine | Admitting: Emergency Medicine

## 2017-11-03 ENCOUNTER — Emergency Department (HOSPITAL_COMMUNITY): Payer: Medicare Other

## 2017-11-03 DIAGNOSIS — I469 Cardiac arrest, cause unspecified: Secondary | ICD-10-CM | POA: Diagnosis not present

## 2017-11-03 DIAGNOSIS — N183 Chronic kidney disease, stage 3 (moderate): Secondary | ICD-10-CM | POA: Diagnosis not present

## 2017-11-03 DIAGNOSIS — R0602 Shortness of breath: Secondary | ICD-10-CM | POA: Diagnosis present

## 2017-11-03 DIAGNOSIS — J9601 Acute respiratory failure with hypoxia: Secondary | ICD-10-CM | POA: Diagnosis not present

## 2017-11-03 DIAGNOSIS — Z7982 Long term (current) use of aspirin: Secondary | ICD-10-CM | POA: Insufficient documentation

## 2017-11-03 DIAGNOSIS — C3490 Malignant neoplasm of unspecified part of unspecified bronchus or lung: Secondary | ICD-10-CM | POA: Diagnosis not present

## 2017-11-03 DIAGNOSIS — Z87891 Personal history of nicotine dependence: Secondary | ICD-10-CM | POA: Insufficient documentation

## 2017-11-03 DIAGNOSIS — E119 Type 2 diabetes mellitus without complications: Secondary | ICD-10-CM | POA: Diagnosis not present

## 2017-11-03 DIAGNOSIS — Z79899 Other long term (current) drug therapy: Secondary | ICD-10-CM | POA: Diagnosis not present

## 2017-11-03 LAB — I-STAT CHEM 8, ED
BUN: 51 mg/dL — ABNORMAL HIGH (ref 8–23)
BUN: 45 mg/dL — ABNORMAL HIGH (ref 8–23)
CALCIUM ION: 1.06 mmol/L — AB (ref 1.15–1.40)
CHLORIDE: 108 mmol/L (ref 98–111)
CHLORIDE: 112 mmol/L — AB (ref 98–111)
CREATININE: 1.7 mg/dL — AB (ref 0.61–1.24)
Calcium, Ion: 1.07 mmol/L — ABNORMAL LOW (ref 1.15–1.40)
Creatinine, Ser: 1.9 mg/dL — ABNORMAL HIGH (ref 0.61–1.24)
GLUCOSE: 103 mg/dL — AB (ref 70–99)
GLUCOSE: 121 mg/dL — AB (ref 70–99)
HCT: 20 % — ABNORMAL LOW (ref 39.0–52.0)
HCT: 16 % — ABNORMAL LOW (ref 39.0–52.0)
HEMOGLOBIN: 6.8 g/dL — AB (ref 13.0–17.0)
Hemoglobin: 5.4 g/dL — CL (ref 13.0–17.0)
POTASSIUM: 5 mmol/L (ref 3.5–5.1)
Potassium: 4.8 mmol/L (ref 3.5–5.1)
SODIUM: 138 mmol/L (ref 135–145)
Sodium: 137 mmol/L (ref 135–145)
TCO2: 15 mmol/L — AB (ref 22–32)
TCO2: 12 mmol/L — ABNORMAL LOW (ref 22–32)

## 2017-11-03 LAB — I-STAT TROPONIN, ED
TROPONIN I, POC: 0 ng/mL (ref 0.00–0.08)
TROPONIN I, POC: 0.35 ng/mL — AB (ref 0.00–0.08)
Troponin i, poc: 0.22 ng/mL (ref 0.00–0.08)

## 2017-11-03 LAB — COMPREHENSIVE METABOLIC PANEL
ALT: 45 U/L — AB (ref 0–44)
AST: 69 U/L — AB (ref 15–41)
Albumin: 3.3 g/dL — ABNORMAL LOW (ref 3.5–5.0)
Alkaline Phosphatase: 105 U/L (ref 38–126)
Anion gap: 20 — ABNORMAL HIGH (ref 5–15)
BUN: 58 mg/dL — ABNORMAL HIGH (ref 8–23)
CHLORIDE: 102 mmol/L (ref 98–111)
CO2: 16 mmol/L — AB (ref 22–32)
CREATININE: 2.57 mg/dL — AB (ref 0.61–1.24)
Calcium: 9.1 mg/dL (ref 8.9–10.3)
GFR calc Af Amer: 26 mL/min — ABNORMAL LOW (ref >60)
GFR calc non Af Amer: 23 mL/min — ABNORMAL LOW (ref >60)
Glucose, Bld: 143 mg/dL — ABNORMAL HIGH (ref 70–99)
Potassium: 6 mmol/L — ABNORMAL HIGH (ref 3.5–5.1)
SODIUM: 138 mmol/L (ref 135–145)
Total Bilirubin: 1 mg/dL (ref 0.3–1.2)
Total Protein: 8.5 g/dL — ABNORMAL HIGH (ref 6.5–8.1)

## 2017-11-03 LAB — URINALYSIS, ROUTINE W REFLEX MICROSCOPIC
BILIRUBIN URINE: NEGATIVE
Glucose, UA: NEGATIVE mg/dL
Hgb urine dipstick: NEGATIVE
KETONES UR: NEGATIVE mg/dL
LEUKOCYTES UA: NEGATIVE
Nitrite: NEGATIVE
PH: 6 (ref 5.0–8.0)
Protein, ur: 30 mg/dL — AB
Specific Gravity, Urine: 1.013 (ref 1.005–1.030)

## 2017-11-03 LAB — CBC WITH DIFFERENTIAL/PLATELET
Basophils Absolute: 0 10*3/uL (ref 0.0–0.1)
Basophils Relative: 0 %
EOS PCT: 1 %
Eosinophils Absolute: 0.1 10*3/uL (ref 0.0–0.7)
HEMATOCRIT: 32.1 % — AB (ref 39.0–52.0)
HEMOGLOBIN: 10.6 g/dL — AB (ref 13.0–17.0)
LYMPHS PCT: 12 %
Lymphs Abs: 0.8 10*3/uL (ref 0.7–4.0)
MCH: 30.8 pg (ref 26.0–34.0)
MCHC: 33 g/dL (ref 30.0–36.0)
MCV: 93.3 fL (ref 78.0–100.0)
MONOS PCT: 11 %
Monocytes Absolute: 0.7 10*3/uL (ref 0.1–1.0)
NEUTROS ABS: 4.7 10*3/uL (ref 1.7–7.7)
Neutrophils Relative %: 76 %
Platelets: 72 10*3/uL — ABNORMAL LOW (ref 150–400)
RBC: 3.44 MIL/uL — ABNORMAL LOW (ref 4.22–5.81)
RDW: 20 % — ABNORMAL HIGH (ref 11.5–15.5)
WBC: 6.3 10*3/uL (ref 4.0–10.5)

## 2017-11-03 LAB — BLOOD GAS, ARTERIAL
ACID-BASE DEFICIT: 4.3 mmol/L — AB (ref 0.0–2.0)
BICARBONATE: 17.9 mmol/L — AB (ref 20.0–28.0)
DRAWN BY: 257701
FIO2: 100
LHR: 20 {breaths}/min
O2 SAT: 99.7 %
PCO2 ART: 24.6 mmHg — AB (ref 32.0–48.0)
PEEP/CPAP: 5 cmH2O
PH ART: 7.476 — AB (ref 7.350–7.450)
Patient temperature: 98.6
VT: 650 mL
pO2, Arterial: 399 mmHg — ABNORMAL HIGH (ref 83.0–108.0)

## 2017-11-03 LAB — PHOSPHORUS: Phosphorus: 4.5 mg/dL (ref 2.5–4.6)

## 2017-11-03 LAB — CBC
HEMATOCRIT: 21.6 % — AB (ref 39.0–52.0)
Hemoglobin: 6.8 g/dL — CL (ref 13.0–17.0)
MCH: 30.5 pg (ref 26.0–34.0)
MCHC: 31.5 g/dL (ref 30.0–36.0)
MCV: 96.9 fL (ref 78.0–100.0)
Platelets: 64 10*3/uL — ABNORMAL LOW (ref 150–400)
RBC: 2.23 MIL/uL — ABNORMAL LOW (ref 4.22–5.81)
RDW: 20.2 % — AB (ref 11.5–15.5)
WBC: 7.6 10*3/uL (ref 4.0–10.5)

## 2017-11-03 LAB — RAPID URINE DRUG SCREEN, HOSP PERFORMED
Amphetamines: NOT DETECTED
Benzodiazepines: NOT DETECTED
COCAINE: NOT DETECTED
Opiates: NOT DETECTED
Tetrahydrocannabinol: POSITIVE — AB

## 2017-11-03 LAB — LIPASE, BLOOD: Lipase: 46 U/L (ref 11–51)

## 2017-11-03 LAB — BRAIN NATRIURETIC PEPTIDE: B Natriuretic Peptide: 185.8 pg/mL — ABNORMAL HIGH (ref 0.0–100.0)

## 2017-11-03 LAB — I-STAT CG4 LACTIC ACID, ED
LACTIC ACID, VENOUS: 6.95 mmol/L — AB (ref 0.5–1.9)
Lactic Acid, Venous: 7.85 mmol/L (ref 0.5–1.9)

## 2017-11-03 LAB — POC OCCULT BLOOD, ED: Fecal Occult Bld: NEGATIVE

## 2017-11-03 LAB — MAGNESIUM: MAGNESIUM: 2.2 mg/dL (ref 1.7–2.4)

## 2017-11-03 LAB — PROTIME-INR
INR: 1.28
INR: 1.77
Prothrombin Time: 15.9 seconds — ABNORMAL HIGH (ref 11.4–15.2)
Prothrombin Time: 20.4 seconds — ABNORMAL HIGH (ref 11.4–15.2)

## 2017-11-03 LAB — APTT: aPTT: 50 seconds — ABNORMAL HIGH (ref 24–36)

## 2017-11-03 MED ORDER — FENTANYL CITRATE (PF) 100 MCG/2ML IJ SOLN: 50.0000 ug | INTRAMUSCULAR | Status: DC | PRN

## 2017-11-03 MED ORDER — SODIUM BICARBONATE 8.4 % IV SOLN
INTRAVENOUS | Status: AC
  Filled 2017-11-03: qty 50

## 2017-11-03 MED ORDER — DEXTROSE 5 % IV SOLN
0.5000 ug/min | INTRAVENOUS | Status: DC
  Filled 2017-11-03: qty 4

## 2017-11-03 MED ORDER — SODIUM CHLORIDE 0.9 % IV BOLUS: 30.0000 mL/kg | Freq: Once | INTRAVENOUS | Status: DC

## 2017-11-03 MED ORDER — ROCURONIUM BROMIDE 50 MG/5ML IV SOLN
70.0000 mg | Freq: Once | INTRAVENOUS | Status: AC
  Administered 2017-11-03: 70 mg via INTRAVENOUS

## 2017-11-03 MED ORDER — ETOMIDATE 2 MG/ML IV SOLN
20.0000 mg | Freq: Once | INTRAVENOUS | Status: AC
  Administered 2017-11-03: 20 mg via INTRAVENOUS

## 2017-11-03 MED ORDER — EPINEPHRINE PF 1 MG/10ML IJ SOSY
PREFILLED_SYRINGE | INTRAMUSCULAR | Status: AC | PRN
  Administered 2017-11-03: 0.1 mg via INTRAVENOUS

## 2017-11-03 MED ORDER — FENTANYL CITRATE (PF) 100 MCG/2ML IJ SOLN
INTRAMUSCULAR | Status: AC
  Filled 2017-11-03: qty 2

## 2017-11-03 MED ORDER — ALTEPLASE (PULMONARY EMBOLISM) INFUSION
50.0000 mg | Freq: Once | INTRAVENOUS | Status: AC
  Administered 2017-11-03: 50 mg via INTRAVENOUS
  Filled 2017-11-03: qty 50

## 2017-11-03 MED ORDER — FENTANYL CITRATE (PF) 100 MCG/2ML IJ SOLN
INTRAMUSCULAR | Status: AC | PRN
  Administered 2017-11-03 (×3): 50 ug via INTRAVENOUS

## 2017-11-03 MED ORDER — PROPOFOL 1000 MG/100ML IV EMUL
5.0000 ug/kg/min | Freq: Once | INTRAVENOUS | Status: AC
  Administered 2017-11-03: 5 ug/kg/min via INTRAVENOUS
  Filled 2017-11-03: qty 100

## 2017-11-03 MED ORDER — PROPOFOL BOLUS VIA INFUSION
50.0000 mg | Freq: Once | INTRAVENOUS | Status: AC
  Administered 2017-11-03: 50 mg via INTRAVENOUS
  Filled 2017-11-03: qty 50

## 2017-11-03 MED ORDER — MAGNESIUM SULFATE 50 % IJ SOLN
INTRAMUSCULAR | Status: AC | PRN
  Administered 2017-11-03: 2 g via INTRAVENOUS

## 2017-11-03 MED ORDER — FENTANYL CITRATE (PF) 100 MCG/2ML IJ SOLN: 50.0000 ug | Freq: Once | INTRAMUSCULAR | Status: DC

## 2017-11-03 MED ORDER — DEXTROSE 5 % IV SOLN
INTRAVENOUS | Status: AC | PRN
  Administered 2017-11-03: 150 mg via INTRAVENOUS

## 2017-11-03 MED ORDER — CALCIUM CHLORIDE 10 % IV SOLN
1.0000 g | Freq: Once | INTRAVENOUS | Status: AC
  Administered 2017-11-03: 1 g via INTRAVENOUS
  Filled 2017-11-03: qty 10

## 2017-11-03 MED ORDER — MIDAZOLAM HCL 5 MG/5ML IJ SOLN
INTRAMUSCULAR | Status: AC | PRN
  Administered 2017-11-03: 2.5 mg via INTRAVENOUS

## 2017-11-03 MED ORDER — SODIUM CHLORIDE 0.9 % IV SOLN: 250.0000 mL | Freq: Once | INTRAVENOUS | Status: DC

## 2017-11-03 MED ORDER — MIDAZOLAM HCL 2 MG/2ML IJ SOLN
INTRAMUSCULAR | Status: AC
  Filled 2017-11-03: qty 6

## 2017-11-03 MED ORDER — IOPAMIDOL (ISOVUE-370) INJECTION 76%
INTRAVENOUS | Status: AC
  Filled 2017-11-03: qty 100

## 2017-11-03 MED ORDER — SODIUM BICARBONATE 8.4 % IV SOLN
50.0000 meq | Freq: Once | INTRAVENOUS | Status: AC
  Administered 2017-11-03: 50 meq via INTRAVENOUS

## 2017-11-03 MED ORDER — DOPAMINE-DEXTROSE 3.2-5 MG/ML-% IV SOLN
INTRAVENOUS | Status: AC | PRN
  Administered 2017-11-03: 20 ug/kg/min via INTRAVENOUS

## 2017-11-03 MED ORDER — MIDAZOLAM HCL 5 MG/5ML IJ SOLN: 5.0000 mg | Freq: Once | INTRAMUSCULAR | Status: DC

## 2017-11-03 MED ORDER — IOPAMIDOL (ISOVUE-370) INJECTION 76%: 100.0000 mL | Freq: Once | INTRAVENOUS | Status: DC | PRN

## 2017-11-03 MED ORDER — EPINEPHRINE PF 1 MG/10ML IJ SOSY
PREFILLED_SYRINGE | INTRAMUSCULAR | Status: AC | PRN
  Administered 2017-11-03: 73.1 ug via INTRAVENOUS
  Administered 2017-11-03: 0.1 mg via INTRAVENOUS

## 2017-11-03 MED ORDER — DOPAMINE-DEXTROSE 3.2-5 MG/ML-% IV SOLN
INTRAVENOUS | Status: AC | PRN
  Administered 2017-11-03: 5 ug/kg/min via INTRAVENOUS

## 2017-11-03 MED FILL — Medication: Qty: 2 | Status: AC

## 2017-11-03 NOTE — ED Notes (Signed)
Per Dr. Pearline Cables, hold ateplase.

## 2017-11-03 NOTE — ED Notes (Signed)
Spoke with organ donor services  Mark Howell [phone]

## 2017-11-03 NOTE — ED Provider Notes (Signed)
[facility] DEPT Provider Note   CSN: [id number] Arrival date & time: 11/23/2017  1539     History   Chief Complaint Chief Complaint  Patient presents with  . Shortness of Breath  . Respiratory Distress    HPI Mark Howell is a 77 y.o. male.  HPI History is limited due to extreme distress of the patient arrival.  He reportedly became very short of breath quite quickly at about 1 PM.  After a couple of hours of no improvement his wife insisted EMS was called. (Patient care initiated prior to wife being able to arrive in the emergency department.  I do not have her direct history at this time).  Medics report upon their arrival patient was in severe respiratory distress with gray mottled color.  He was placed on BiPAP.  Some improvement of color but still cyanotic appearance.  Patient was awake and extremely anxious.  She denies chest pain.  He endorses that the shortness of breath came on very fast.  Seems to imply that he might of had fever earlier but not recently.  Patient does advise he wishes all life-sustaining measures. Respiratory therapist sought me out immediately after patient arrival to room.  They had transitioned the patient from BiPAP to 15 L oxygen.  He immediately dropped his saturation to the 70s and very rapidly became more cyanotic. Past Medical History:  Diagnosis Date  . Arthritis   . Complication of anesthesia    problems voiding post op  . Diabetes mellitus without complication ([place])   . Full dentures   . GERD (gastroesophageal reflux disease)   . History of radiation therapy 01/25/16-02/28/16   right lung 45 Gy  . History of radiation therapy 04/17/16-05/02/16   right lung boost 20 Gy in 10 fractions cumulative dose 65 gray  . HOH (hard of hearing)   . Hyperlipidemia   . lung ca dx'd 01/2016  . Lung mass 12/30/2015  . Odynophagia 04/06/2016  . Psoriatic arthritis ([place])   . Snores   . Wears glasses     Patient Active Problem  List   Diagnosis Date Noted  . Diarrhea 10/16/2017  . AKI (acute kidney injury) ([place]) 10/15/2017  . CKD (chronic kidney disease), stage III ([place]) 10/15/2017  . Hypomagnesemia 10/15/2017  . Thrombocytopenia ([place]) 10/15/2017  . Normocytic anemia 10/15/2017  . 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 Dyspnea 09/11/2017  . Dehydration 06/06/2017  . Sepsis ([place]) 05/09/2017  . Anxiety 04/11/2017  . Port-A-Cath in place 02/19/2017  . Adenocarcinoma of right lung, stage 4 ([place]) 12/27/2016  . Goals of care, counseling/discussion 12/27/2016  . Odynophagia 04/06/2016  . Antineoplastic chemotherapy induced anemia 04/06/2016  . 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 Snoring 03/19/2012    Past Surgical History:  Procedure Laterality Date  . COLONOSCOPY    . HEMORRHOID SURGERY N/A 10/01/2013   Procedure: EXAM UNDER ANESTHESIA  AND EXCISIONAL HEMORRHOIDECTOMY WITH HEMORRHOID BANDING X 2;  Surgeon: Gayland Curry, MD;  Location: [facility];  Service: General;  Laterality: N/A;  . INGUINAL HERNIA REPAIR  01/04/2012   Procedure: LAPAROSCOPIC INGUINAL HERNIA;  Surgeon: Gayland Curry, MD,FACS;  Location: [facility];  Service: General;  Laterality: Right;  . IR FLUORO GUIDE PORT INSERTION RIGHT  01/02/2017  . IR US GUIDE VASC ACCESS RIGHT  01/02/2017  . VIDEO BRONCHOSCOPY WITH ENDOBRONCHIAL ULTRASOUND N/A 12/31/2015   Procedure: VIDEO BRONCHOSCOPY WITH ENDOBRONCHIAL ULTRASOUND transbronchial biopsy of node 10 R  lymph node;  Surgeon: Grace Isaac, MD;  Location[facility] OR;  Service: Thoracic;  Laterality: N/A;        Home Medications    Prior to Admission medications   Medication Sig Start Date End Date Taking? Authorizing Provider  albuterol (VENTOLIN HFA) 108 (90 Base) MCG/ACT inhaler Inhale 2 puffs into the lungs every 6 (six) hours as needed for wheezing or shortness of breath.  02/20/17   [provider]  aspirin EC 81 MG tablet Take 81 mg by mouth at  bedtime.     [provider]  blood glucose meter kit and supplies KIT Dispense based on patient and insurance preference. Use up to four times daily as directed. (FOR ICD-9 250.00, 250.01). 06/12/17   Mark, Jeanella Flattery, MD  calcium carbonate (TUMS - DOSED IN MG ELEMENTAL CALCIUM) 500 MG chewable tablet Chew 1 tablet by mouth daily.    [provider]  Cetirizine HCl 10 MG TBDP Take 1 tablet by mouth daily.  02/26/17   [provider]  Coenzyme Q10 (CO Q 10 PO) Take 1 tablet by mouth daily.    [provider]  Continuous Blood Gluc Sensor MISC 1 each by Does not apply route as directed. Use as directed every 10 days. May dispense FreeStyle [employer] or similar. 06/12/17   Mark, Ankit Chirag, MD  dexamethasone (DECADRON) 4 MG tablet TAKE 1 TABLET TWICE DAILY FOR 3 DAYS (DAY BEFORE, DAY OF, AND DAY AFTER CHEMO) 07/30/17   Curt Bears, MD  diphenoxylate-atropine (LOMOTIL) 2.5-0.025 MG tablet Take 2 tablets by mouth 4 (four) times daily as needed for diarrhea or loose stools. 05/07/17   Tanner, Lyndon Code., PA-C  dronabinol (MARINOL) 2.5 MG capsule Take 1 capsule (2.5 mg total) by mouth 2 (two) times daily before lunch and supper. 10/23/17   Curt Bears, MD  feeding supplement, ENSURE ENLIVE, (ENSURE ENLIVE) LIQD Take 237 mLs by mouth 2 (two) times daily between meals. 10/17/17   Debbe Odea, MD  fluticasone-salmeterol (ADVAIR HFA) 846-96 MCG/ACT inhaler Inhale 2 puffs into the lungs 2 (two) times daily. 05/29/17   Tanner, Lyndon Code., PA-C  folic acid (FOLVITE) 1 MG tablet TAKE 1 TABLET DAILY 10/05/17   Curt Bears, MD  GLIPIZIDE XL 5 MG 24 hr tablet Take 5 mg by mouth daily with breakfast.  09/26/17   [provider]  lidocaine (XYLOCAINE) 2 % solution Use as directed 20 mLs in the mouth or throat every 3 (three) hours as needed for mouth pain. 05/11/17   Tanner, Lyndon Code., PA-C  lidocaine-prilocaine (EMLA) cream Apply 1 application topically as needed.  12/27/16   Curt Bears, MD  loperamide (IMODIUM A-D) 2 MG tablet Take 2 mg by mouth daily as needed for diarrhea or loose stools.    [provider]  LORazepam (ATIVAN) 0.5 MG tablet Take 1 tablet (0.5 mg total) by mouth 2 (two) times daily as needed for anxiety. 07/16/17   Tanner, Lyndon Code., PA-C  Lutein 10 MG TABS Take 10 mg by mouth at bedtime.     [provider]  magic mouthwash SOLN Take 5 mLs by mouth 4 (four) times daily as needed for mouth pain. Patient not taking: Reported on 10/23/2017 01/22/17   Harle Stanford., PA-C  Melatonin 5 MG TABS Take 5 mg by mouth at bedtime.    [provider]  Methylcobalamin (METHYL B-12 PO) Take 1 tablet by mouth daily.    [provider]  mirtazapine (REMERON)  30 MG tablet Take 30 mg by mouth at bedtime.  09/26/17   [provider]  Multiple Vitamin (MULITIVITAMIN WITH MINERALS) TABS Take 1 tablet by mouth daily.    [provider]  nystatin (MYCOSTATIN/NYSTOP) powder Apply topically 3 (three) times daily. Apply to groin rash. 10/11/17   Curt Bears, MD  pantoprazole (PROTONIX) 40 MG tablet Take 40 mg by mouth daily.     [provider]  pravastatin (PRAVACHOL) 40 MG tablet Take 40 mg by mouth at bedtime.  10/17/13   [provider]  predniSONE (DELTASONE) 5 MG tablet Take 10 mg by mouth daily with breakfast.     [provider]  prochlorperazine (COMPAZINE) 10 MG tablet Take 1 tablet (10 mg total) by mouth every 6 (six) hours as needed for nausea or vomiting. Patient not taking: Reported on 10/23/2017 12/27/16   Curt Bears, MD  tamsulosin (FLOMAX) 0.4 MG CAPS capsule TAKE (1) CAPSULE DAILY, START 4 DAYS BEFORE PROCEDURE Patient taking differently: take 1 capsule by mouth every morning 11/20/13   Greer Pickerel, MD  triamcinolone (NASACORT ALLERGY 24HR) 55 MCG/ACT AERO nasal inhaler Place 1 spray into the nose at bedtime.    [provider]    Family  History Family History  Problem Relation Age of Onset  . Stroke Father   . Diabetes Father   . Heart disease Father   . Cancer Brother        pancreatic    Social History Social History   Tobacco Use  . Smoking status: Former Smoker    Packs/day: 0.50    Years: 50.00    Pack years: 25.00    Types: Cigarettes    Last attempt to quit: 11/30/2015    Years since quitting: 1.9  . Smokeless tobacco: Never Used  Substance Use Topics  . Alcohol use: Not Currently  . Drug use: No     Allergies   Hydrocodone and Oxycodone   Review of Systems Review of Systems Level 5 caveat cannot obtain due to patient distress.  Physical Exam Updated Vital Signs BP (!) 57/34   Pulse 61   Temp (!) 102.6 F (39.2 C)   Resp 16   Ht '6\' 2"'$  (1.88 m)   Wt 73 kg (161 lb)   SpO2 100%   BMI 20.67 kg/m   Physical Exam  Constitutional:  Patient is alert and extremely anxious.  Tachypnea 60 breaths/min.  Pallor and cyanotic color of chest.  HENT:  Head: Normocephalic and atraumatic.  Posterior oropharynx widely patent.  No pooling secretions.  Mucous membranes dry.  Patient wears dentures and is edentulous.  Eyes: EOM are normal.  Neck: JVD present.  Cardiovascular:  Heart is tachycardic.  Regular.  Rub or murmur obscured by significant respiratory noise.  Extremities have extremely delayed cap refill.  Central pulses present.  Pulmonary/Chest:  Tachypnea with severe respiratory distress.  Movement throughout left lung field.  Right lung field that has wheeze localizing to mid and lower lung fields.  Abdominal: Soft. He exhibits no distension. There is no guarding.  Musculoskeletal:       Right lower leg: Normal.       Left lower leg: Normal.  Neurological:  Patient is awake and oriented.  He is responding appropriately to questions through the BiPAP mask.  He is extremely anxious.  No focal neurologic deficits.  Skin:  Skin is pale and mottled.  Greater than 3-second capillary refill of  extremities.     ED Treatments /  Results  Labs (all labs ordered are listed, but only abnormal results are displayed) Labs Reviewed  CULTURE, BLOOD (ROUTINE X 2) - Abnormal; Notable for the following components:      Result Value   Culture   (*)    Value: STAPHYLOCOCCUS SPECIES (COAGULASE NEGATIVE) THE SIGNIFICANCE OF ISOLATING THIS ORGANISM FROM A SINGLE SET OF BLOOD CULTURES WHEN MULTIPLE SETS ARE DRAWN IS UNCERTAIN. PLEASE NOTIFY THE MICROBIOLOGY DEPARTMENT WITHIN ONE WEEK IF SPECIATION AND SENSITIVITIES ARE REQUIRED. Performed at [facility], [place] [address], [place], [place] [address]    All other components within normal limits  BLOOD CULTURE ID PANEL (REFLEXED) - Abnormal; Notable for the following components:   Staphylococcus species DETECTED (*)    All other components within normal limits  COMPREHENSIVE METABOLIC PANEL - Abnormal; Notable for the following components:   Potassium 6.0 (*)    CO2 16 (*)    Glucose, Bld 143 (*)    BUN 58 (*)    Creatinine, Ser 2.57 (*)    Total Protein 8.5 (*)    Albumin 3.3 (*)    AST 69 (*)    ALT 45 (*)    GFR calc non Af Amer 23 (*)    GFR calc Af Amer 26 (*)    Anion gap 20 (*)    All other components within normal limits  BRAIN NATRIURETIC PEPTIDE - Abnormal; Notable for the following components:   B Natriuretic Peptide 185.8 (*)    All other components within normal limits  CBC WITH DIFFERENTIAL/PLATELET - Abnormal; Notable for the following components:   RBC 3.44 (*)    Hemoglobin 10.6 (*)    HCT 32.1 (*)    RDW 20.0 (*)    Platelets 72 (*)    All other components within normal limits  PROTIME-INR - Abnormal; Notable for the following components:   Prothrombin Time 15.9 (*)    All other components within normal limits  URINALYSIS, ROUTINE W REFLEX MICROSCOPIC - Abnormal; Notable for the following components:   Protein, ur 30 (*)    Bacteria, UA FEW (*)    All other components within normal limits  RAPID URINE  DRUG SCREEN, HOSP PERFORMED - Abnormal; Notable for the following components:   Tetrahydrocannabinol POSITIVE (*)    Barbiturates   (*)    Value: Result not available. Reagent lot number recalled by manufacturer.   All other components within normal limits  BLOOD GAS, ARTERIAL - Abnormal; Notable for the following components:   pH, Arterial 7.476 (*)    pCO2 arterial 24.6 (*)    pO2, Arterial 399 (*)    Bicarbonate 17.9 (*)    Acid-base deficit 4.3 (*)    All other components within normal limits  APTT - Abnormal; Notable for the following components:   aPTT 50 (*)    All other components within normal limits  PROTIME-INR - Abnormal; Notable for the following components:   Prothrombin Time 20.4 (*)    All other components within normal limits  CBC - Abnormal; Notable for the following components:   RBC 2.23 (*)    Hemoglobin 6.8 (*)    HCT 21.6 (*)    RDW 20.2 (*)    Platelets 64 (*)    All other components within normal limits  I-STAT CHEM 8, ED - Abnormal; Notable for the following components:   BUN 51 (*)    Creatinine, Ser 1.90 (*)    Glucose, Bld 103 (*)  Calcium, Ion 1.06 (*)    TCO2 15 (*)    Hemoglobin 6.8 (*)    HCT 20.0 (*)    All other components within normal limits  I-STAT CG4 LACTIC ACID, ED - Abnormal; Notable for the following components:   Lactic Acid, Venous 6.95 (*)    All other components within normal limits  I-STAT CG4 LACTIC ACID, ED - Abnormal; Notable for the following components:   Lactic Acid, Venous 7.85 (*)    All other components within normal limits  I-STAT CHEM 8, ED - Abnormal; Notable for the following components:   Chloride 112 (*)    BUN 45 (*)    Creatinine, Ser 1.70 (*)    Glucose, Bld 121 (*)    Calcium, Ion 1.07 (*)    TCO2 12 (*)    Hemoglobin 5.4 (*)    HCT 16.0 (*)    All other components within normal limits  I-STAT TROPONIN, ED - Abnormal; Notable for the following components:   Troponin i, poc 0.22 (*)    All other  components within normal limits  I-STAT TROPONIN, ED - Abnormal; Notable for the following components:   Troponin i, poc 0.35 (*)    All other components within normal limits  I-STAT CHEM 8, ED - Abnormal; Notable for the following components:   Sodium 134 (*)    Potassium 5.8 (*)    BUN 54 (*)    Creatinine, Ser 2.30 (*)    Glucose, Bld 139 (*)    Calcium, Ion 1.07 (*)    TCO2 15 (*)    Hemoglobin 10.5 (*)    HCT 31.0 (*)    All other components within normal limits  CULTURE, BLOOD (ROUTINE X 2)  LIPASE, BLOOD  MAGNESIUM  PHOSPHORUS  I-STAT TROPONIN, ED  POC OCCULT BLOOD, ED    EKG EKG Interpretation  Date/Time:  Saturday November 03 2017 16:44:34 EDT Ventricular Rate:  143 PR Interval:    QRS Duration: 107 QT Interval:  263 QTC Calculation: 406 R Axis:   -4 Text Interpretation:  Sinus tachycardia Incomplete left bundle branch block Low voltage, precordial leads ST elevation, consider inferior injury new interventricular conduction delay compared to previoua Confirmed by Charlesetta Shanks [phone]) on 11/28/2017 5:01:00 PM   Radiology No results found.  Procedures Procedure Name: Intubation Date/Time: 11/10/2017 4:37 PM Performed by: Charlesetta Shanks, MD Pre-anesthesia Checklist: Patient identified, Emergency Drugs available, Suction available and Patient being monitored Oxygen Delivery Method: Ambu bag Induction Type: Rapid sequence Ventilation: Two handed mask ventilation required Laryngoscope Size: Glidescope and 4 Grade View: Grade I Tube size: 7.5 mm Number of attempts: 1 Airway Equipment and Method: Video-laryngoscopy Placement Confirmation: ETT inserted through vocal cords under direct vision,  Positive ETCO2 and Breath sounds checked- equal and bilateral Secured at: 22 cm Tube secured with: ETT holder Dental Injury: Teeth and Oropharynx as per pre-operative assessment  Difficulty Due To: Difficulty was unanticipated      (including critical care  time) CRITICAL CARE Performed by: Charlesetta Shanks   Total critical care time: 90 minutes  Critical care time was exclusive of separately billable procedures and treating other patients.  Critical care was necessary to treat or prevent imminent or life-threatening deterioration.  Critical care was time spent personally by me on the following activities: development of treatment plan with patient and/or surrogate as well as nursing, discussions with consultants, evaluation of patient's response to treatment, examination of patient, obtaining history from patient or surrogate, ordering and performing  treatments and interventions, ordering and review of laboratory studies, ordering and review of radiographic studies, pulse oximetry and re-evaluation of patient's condition. Medications Ordered in ED Medications  iopamidol (ISOVUE-370) 76 % injection (has no administration in time range)  fentaNYL (SUBLIMAZE) 100 MCG/2ML injection (has no administration in time range)  calcium chloride injection 1 g (1 g Intravenous Given 11/21/2017 1629)  sodium bicarbonate injection 50 mEq (50 mEq Intravenous Given 10/29/2017 1629)  etomidate (AMIDATE) injection 20 mg (20 mg Intravenous Given 11/02/2017 1630)  rocuronium (ZEMURON) injection 70 mg (70 mg Intravenous Given 11/19/2017 1630)  propofol (DIPRIVAN) 1000 MG/100ML infusion (0 mcg/kg/min  73.1 kg Intravenous Stopped 11/18/2017 1856)  propofol (DIPRIVAN) bolus via infusion 50 mg (50 mg Intravenous Bolus from Bag 10/30/2017 1712)  alteplase (ACTIVASE) 1 mg/mL infusion 50 mg (0 mg Intravenous Stopped 11/18/2017 1856)  EPINEPHrine (ADRENALIN) 1 MG/10ML injection (73.1 mcg Intravenous Given 11/14/2017 1809)  amiodarone (CORDARONE) 150 mg in dextrose 5 % 100 mL bolus ( Intravenous Stopped 11/18/2017 1856)  DOPamine (INTROPIN) 800 mg in dextrose 5 % 250 mL (3.2 mg/mL) infusion ( Intravenous Stopped November 18, 2017 1856)  DOPamine (INTROPIN) 800 mg in dextrose 5 % 250 mL (3.2 mg/mL) infusion ( Intravenous  Stopped 2017-11-18 1856)  fentaNYL (SUBLIMAZE) injection (50 mcg Intravenous Given 11/10/2017 1855)  midazolam (VERSED) 5 MG/5ML injection (2.5 mg Intravenous Given 11/28/2017 1759)  magnesium sulfate (IV Push/IM) injection (2 g Intravenous Given 11/05/2017 1812)  EPINEPHrine (ADRENALIN) 1 MG/10ML injection (0.1 mg Intravenous Given 11/09/2017 1822)     Initial Impression / Assessment and Plan / ED Course  I have reviewed the triage vital signs and the nursing notes.  Pertinent labs & imaging results that were available during my care of the patient were reviewed by me and considered in my medical decision making (see chart for details).  Clinical Course as of Nov 10 1444  Sat Nov 03, 2017  1843 Rectal: Soft brown-yellow stool in the vault.  No macroscopic blood or melena.   [MP]    Clinical Course User Index [MP] Charlesetta Shanks, MD   Consult: Dr. Pearline Cables intensivist.  Consultation done in the emergency department.   Final Clinical Impressions(s) / ED Diagnoses   Final diagnoses:  Metastatic non-small cell lung cancer ([place])  Acute respiratory failure with hypoxia [facility])  Cardiopulmonary arrest [facility])  Patient presented as outlined.  He is in extreme distress upon arrival.  Patient was intubated shortly after arrival.  He did have mild hyperkalemia.  Patient was treated with calcium gluconate and bicarbonate prior to intubation.  Rocuronium used.  Patient tolerated intubation well.  Immediately post intubation he was at 100% oxygenation with sinus tachycardia stable blood pressure.  He appeared to be stabilizing however just prior to preparing to take the patient to CT scan, he coded and required CPR resuscitation.  See nursing notes.  He did have return of circulation and again temporarily appeared to be stabilizing.  However it required full pressor support with both dopamine and epinephrine.  See all notes for sequences of resuscitation and medication administration.  Ultimately, after second resuscitation  and patient's significant dependency on pressors, I did not feel that he was likely to stabilize adequately for CT scan.  At that time I determined to proceed with thrombolytics as PE was high in the differential with severe hypoxia but no other evident diagnoses based on chest x-ray, blood gases and history.  Patient's initial chest x-ray was relatively stable appearance without pneumothorax, large pleural effusion or extensive infiltrate  different from previous.  Patient's i-STAT gas returned nearly simultaneously with his CBC.  CBC indicated a stable anemia at hemoglobin of 10 mg/dL.  I-STAT showed significant anemia.  There was no evidence of a bleeding source.  Rectal exam showed brown stool with no melena or blood and tested negative.  Patient did not have any distended abdomen or large areas of ecchymosis or bruising.  No extremity edema or areas of swelling.  No deformities of the extremities nor pleural effusions that could have been hemorrhagic.  No clinical evidence of a source of acute blood loss. I Felt that the laboratory CBC was consistent with the patient's clinical findings/previous values and the i-STAT was likely in error.  No blood was transfused.  Dr. Pearline Cables came to assess the patient and planned again to try to attempt CT scanning.  Shortly after his assessment patient again became bradycardic and coded.  At that time he reviewed this with family and they determined to discontinue hesitation measures.  I had previously had long consultation with the family in family room.  I had counseled them on the nature of the severity of the patient's illness and the critical nature.  They had been aware of how unstable he was were able to briefly see him in the emergency department before he deceased.  ED Discharge Orders    None       Charlesetta Shanks, MD 11/10/17 1454

## 2017-11-03 NOTE — ED Triage Notes (Signed)
EMS reports from home, woke up 1400 having severe SOB, upon EMS arrival respiratory distress, cyanotic, Mid 70s sp02 on scene, resps at 40, placed on CPAP enroute. Hx of Lung cancer. Chemo pt.  BP 137/105 HR 130 Resp 40 Sp02 99 on CPAP CBG 125  18 RAC

## 2017-11-03 NOTE — ED Notes (Signed)
LACTIC 7.85 MD AND RN NOTIFIED

## 2017-11-03 NOTE — ED Notes (Signed)
Spoke with bed control  Case reported

## 2017-11-03 NOTE — ED Notes (Signed)
Note do not have a certificate of death  Provider walter J gray MD removed from chart at 22

## 2017-11-03 NOTE — ED Notes (Signed)
Wife next of kin Radio producer at [employer] [phone]

## 2017-11-03 NOTE — ED Notes (Signed)
Pt cleaned and foley removed Pt placed in body bag  Toe tail placed  Ac called  Security called to move pt to morgue

## 2017-11-03 NOTE — ED Notes (Signed)
RN AND MD NOTIFIED OF PATIENT'S LACTIC ACID LEVEL OF 6.95

## 2017-11-03 NOTE — ED Notes (Signed)
1737: 3rd L NS 1740: 4th L NS 9191 CPR resumed 1743.30 CPR stopped 1757: 5th L NS 1750: epi gtt 5 mcg/min 1759: transport to CT, returned to Res B 1800: 6th L LR 1823: port access R chest 1828 7th L LR 1825 family arrived, Dr. Johnney Killian talked to them 1826: rectal occult blood sample taken by Dr. Johnney Killian 1836: alteplase 50mg /mL @ 35 ml/HR 1837: urine output 150cc 1840: epi 90mcg/min rate change; 15 mcg/min rate change 1841: CC MD arrived 6606: epi 4 mcg/min rate change 1903: 2 mL bolus epi 1905: CPR resumed 1908: 1 mcg epi 1911: code end

## 2017-11-03 NOTE — ED Notes (Signed)
Pt reported to be in distress by Mark Howell while pt in room 11. Pt was transferred to Resus B for RSI. Pt intubated @ 1634 by Dr. Johnney Killian. Pt had + color change and ETT 22 @ lip.

## 2017-11-03 NOTE — ED Notes (Signed)
TROPONIN LEVEL 0.35 MD AND RN NOTIFIED

## 2017-11-03 NOTE — Consult Note (Signed)
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  He has been undergoing chemotherapy for non-small cell lung cancer with a regimen unknown to the family.  He was brought to the emergency room after many days of very severe dyspnea which precluded him even getting from the bed to the chair.  Today he was in extremis and mottled on arrival in the department of emergency medicine where he subsequently suffered from a PEA arrest.  There have been no complaint of cough fevers chills or chest pain prior to arrival.  He was resuscitated from PEA transiently then suffered from a VT arrest was cardioverted back to sinus started on epi and dopamine infusion for relatively bradycardic sinus rhythm associated with hypotension.  Old records indicate a history of "aortitis" but I note an echo of 2/19 which shows a normal nondilated aorta.  I also have no history of chest pain.  Initial EKG did not show any acute changes after 3 rounds of CPR he had an EKG which showed inferior ST elevation.  On my arrival the patient was overtly hypertensive on those agents and was actually nodding to questions.  I administered a modest dose of fentanyl in the hopes of obtaining a CTA well as a CT scan of the head to determine whether will be safe to continue lytics in the event that he did have a PE however he again became overtly hypotensive and CPR was initiated with the family present.  After 3 rounds of epinephrine family asked that we discontinue our resuscitative efforts and the patient passed quietly with family at the bedside at approximately 1917.  PAST MEDICAL HISTORY :  He  has a past medical history of Arthritis, Complication of anesthesia, Diabetes  mellitus without complication ([place]), Full dentures, GERD (gastroesophageal reflux disease), History of radiation therapy (01/25/16-02/28/16), History of radiation therapy (04/17/16-05/02/16), HOH (hard of hearing), Hyperlipidemia, lung ca (dx'd 01/2016), Lung mass (12/30/2015), Odynophagia (04/06/2016), Psoriatic arthritis ([place]), Snores, and Wears glasses.  PAST SURGICAL HISTORY: He  has a past surgical history that includes Inguinal hernia repair (01/04/2012); Colonoscopy; Hemorrhoid surgery (N/A, 10/01/2013); Video bronchoscopy with endobronchial ultrasound (N/A, 12/31/2015); IR FLUORO GUIDE PORT INSERTION RIGHT (01/02/2017); and IR US Guide Vasc Access Right (01/02/2017).  Allergies  Allergen Reactions  . Hydrocodone Rash  . Oxycodone Rash    No current facility-administered medications on file prior to encounter.    Current Outpatient Medications on File Prior to Encounter  Medication Sig  . albuterol (VENTOLIN HFA) 108 (90 Base) MCG/ACT inhaler Inhale 2 puffs into the lungs every 6 (six) hours as needed for wheezing or shortness of breath.   Marland Kitchen aspirin EC 81 MG tablet Take 81 mg by mouth at bedtime.   . blood glucose meter kit and supplies KIT Dispense based on patient and insurance preference. Use up to four times daily as directed. (FOR ICD-9 250.00, 250.01).  . calcium carbonate (TUMS - DOSED IN MG ELEMENTAL CALCIUM) 500 MG chewable tablet Chew 1 tablet by mouth daily.  . Cetirizine HCl 10 MG TBDP Take 1 tablet by mouth daily.   . Coenzyme Q10 (CO Q 10 PO) Take 1 tablet by mouth daily.  . Continuous Blood Gluc Sensor MISC  1 each by Does not apply route as directed. Use as directed every 10 days. May dispense FreeStyle [employer] or similar.  . dexamethasone (DECADRON) 4 MG tablet TAKE 1 TABLET TWICE DAILY FOR 3 DAYS (DAY BEFORE, DAY OF, AND DAY AFTER CHEMO)  . diphenoxylate-atropine (LOMOTIL) 2.5-0.025 MG tablet Take 2 tablets by mouth 4 (four) times daily as needed for diarrhea or loose  stools.  Marland Kitchen dronabinol (MARINOL) 2.5 MG capsule Take 1 capsule (2.5 mg total) by mouth 2 (two) times daily before lunch and supper.  . feeding supplement, ENSURE ENLIVE, (ENSURE ENLIVE) LIQD Take 237 mLs by mouth 2 (two) times daily between meals.  . fluticasone-salmeterol (ADVAIR HFA) 115-21 MCG/ACT inhaler Inhale 2 puffs into the lungs 2 (two) times daily.  . folic acid (FOLVITE) 1 MG tablet TAKE 1 TABLET DAILY  . GLIPIZIDE XL 5 MG 24 hr tablet Take 5 mg by mouth daily with breakfast.   . lidocaine (XYLOCAINE) 2 % solution Use as directed 20 mLs in the mouth or throat every 3 (three) hours as needed for mouth pain.  Marland Kitchen lidocaine-prilocaine (EMLA) cream Apply 1 application topically as needed.  . loperamide (IMODIUM A-D) 2 MG tablet Take 2 mg by mouth daily as needed for diarrhea or loose stools.  Marland Kitchen LORazepam (ATIVAN) 0.5 MG tablet Take 1 tablet (0.5 mg total) by mouth 2 (two) times daily as needed for anxiety.  . Lutein 10 MG TABS Take 10 mg by mouth at bedtime.   . magic mouthwash SOLN Take 5 mLs by mouth 4 (four) times daily as needed for mouth pain. (Patient not taking: Reported on 10/23/2017)  . Melatonin 5 MG TABS Take 5 mg by mouth at bedtime.  . Methylcobalamin (METHYL B-12 PO) Take 1 tablet by mouth daily.  . mirtazapine (REMERON) 30 MG tablet Take 30 mg by mouth at bedtime.   . Multiple Vitamin (MULITIVITAMIN WITH MINERALS) TABS Take 1 tablet by mouth daily.  Marland Kitchen nystatin (MYCOSTATIN/NYSTOP) powder Apply topically 3 (three) times daily. Apply to groin rash.  . pantoprazole (PROTONIX) 40 MG tablet Take 40 mg by mouth daily.   . pravastatin (PRAVACHOL) 40 MG tablet Take 40 mg by mouth at bedtime.   . predniSONE (DELTASONE) 5 MG tablet Take 10 mg by mouth daily with breakfast.   . prochlorperazine (COMPAZINE) 10 MG tablet Take 1 tablet (10 mg total) by mouth every 6 (six) hours as needed for nausea or vomiting. (Patient not taking: Reported on 10/23/2017)  . tamsulosin (FLOMAX) 0.4 MG CAPS  capsule TAKE (1) CAPSULE DAILY, START 4 DAYS BEFORE PROCEDURE (Patient taking differently: take 1 capsule by mouth every morning)  . triamcinolone (NASACORT ALLERGY 24HR) 55 MCG/ACT AERO nasal inhaler Place 1 spray into the nose at bedtime.    FAMILY HISTORY:  His indicated that his mother is deceased. He indicated that his father is deceased. He indicated that the status of his brother is unknown.   SOCIAL HISTORY: He  reports that he quit smoking about 23 months ago. His smoking use included cigarettes. He has a 25.00 pack-year smoking history. He has never used smokeless tobacco. He reports that he drank alcohol. He reports that he does not use drugs.  REVIEW OF SYSTEMS:   Unobtainable  SUBJECTIVE:  As above  VITAL SIGNS: BP (!) 57/34   Pulse 61   Temp (!) 102.6 F (39.2 C)   Resp 16   Ht '6\' 2"'$  (1.88 m)   SpO2 100%   BMI 20.68 kg/m  HEMODYNAMICS:    VENTILATOR SETTINGS: Vent Mode: PRVC FiO2 (%):  [60 %-100 %] 60 % Set Rate:  [20 bmp] 20 bmp Vt Set:  [650 mL] 650 mL PEEP:  [5 cmH20] 5 cmH20 Plateau Pressure:  [15 cmH20] 15 cmH20  INTAKE / OUTPUT: No intake/output data recorded.  PHYSICAL EXAMINATION: General: Slightly mottled elderly male intermittently awakening and nodding to questions Neuro: Pupils equal face symmetric  Cardiovascular: S1 and S2 are somewhat distant and regular without murmur rub or gallop there was no overt JVD Lungs: Barrel chested, symmetric air movement, no wheezes.  Poor air movement bilaterally no overt wheezes normal IT ratio Abdomen: The abdomen is soft I do not appreciate any organomegaly I cannot appreciate the aorta on palpation. Musculoskeletal: Diffusely  mottled   LABS:  BMET Recent Labs  Lab 11/21/2017 1616 11/11/2017 1817 11/22/2017 1835  NA 138 138 137  K 6.0* 4.8 5.0  CL 102 108 112*  CO2 16*  --   --   BUN 58* 51* 45*  CREATININE 2.57* 1.90* 1.70*  GLUCOSE 143* 103* 121*    Electrolytes Recent Labs  Lab  11/09/2017 1616  CALCIUM 9.1    CBC Recent Labs  Lab 11/27/2017 1616 11/14/2017 1817 11/09/2017 1835 11/27/2017 1837  WBC 6.3  --   --  7.6  HGB 10.6* 6.8* 5.4* 6.8*  HCT 32.1* 20.0* 16.0* 21.6*  PLT 72*  --   --  64*    Coag's Recent Labs  Lab 11/16/2017 1616 10/30/2017 1837  APTT  --  50*  INR 1.28 1.77    Sepsis Markers Recent Labs  Lab 11/27/2017 1621 10/31/2017 1820  LATICACIDVEN 6.95* 7.85*    ABG Recent Labs  Lab 11/09/2017 1710  PHART 7.476*  PCO2ART 24.6*  PO2ART 399*    Liver Enzymes Recent Labs  Lab 11/22/2017 1616  AST 69*  ALT 45*  ALKPHOS 105  BILITOT 1.0  ALBUMIN 3.3*    Cardiac Enzymes No results for input(s): TROPONINI, PROBNP in the last 168 hours.  Glucose No results for input(s): GLUCAP in the last 168 hours.  Imaging Dg Chest Portable 1 View  Result Date: 11/19/2017 CLINICAL DATA:  Patient distress. EXAM: PORTABLE CHEST 1 VIEW COMPARISON:  November 03, 2017 FINDINGS: Stable right Port-A-Cath. An ET tube is in good position. The NG tube flips back on itself in the esophagus. Recommend repositioning. No pneumothorax. A mass centered in the right hilum is similar in size in the interval consistent with known treated malignancy. May be new patchy opacity in the left mid and lower lung, partially obscured by the pacemaker leads. Mild increased interstitial markings on the right slightly more prominent the interval. No change in the cardiomediastinal silhouette. IMPRESSION: 1. The NG tube terminates in the esophagus and is flipped back on itself. Recommend repositioning. 2. The ETT and right Port-A-Cath are in good position. 3. Suggested patchy opacity in left mid lower lung, partially obscured by the transcutaneous pacer suggests the possibility of pneumonia. 4. Mild increased interstitial markings on the right is nonspecific and could represent mild edema, post therapeutic change, or lymphangitic spread of tumor. Electronically Signed   By: Dorise Bullion III M.D    On: 11/01/2017 18:34   Dg Chest Port 1 View  Result Date: 11/20/2017 CLINICAL DATA:  Respiratory distress, history of lung cancer EXAM: PORTABLE CHEST 1 VIEW COMPARISON:  10/15/2017 chest radiograph. FINDINGS: Right internal jugular MediPort terminates at the cavoatrial junction. Stable cardiomediastinal silhouette with normal heart size. No  pneumothorax. No pleural effusion. Stable right perihilar lung consolidation and volume loss. Stable curvilinear scarring at the left lung base. No pulmonary edema. No acute consolidative airspace disease. IMPRESSION: Stable chest radiograph with post treatment change in the right perihilar lung and left lung base scarring. No acute cardiopulmonary disease. Electronically Signed   By: Ilona Sorrel M.D.   On: 11/25/2017 16:25     STUDIES:  Chest x-ray showed a well-placed endotracheal tube and a right hilum with a very large dense mass.  CULTURES:  DISCUSSION:     77 year old with metastatic lung cancer status post multiple PEA arrests.  At family request resuscitative efforts were abandoned.  Rater than 32 minutes was spent in the care of this patient today  Lars Masson, MD  [place] Pager: [phone]  11/20/2017, 7:16 PM

## 2017-11-04 LAB — BLOOD CULTURE ID PANEL (REFLEXED)
ACINETOBACTER BAUMANNII: NOT DETECTED
CANDIDA ALBICANS: NOT DETECTED
CANDIDA KRUSEI: NOT DETECTED
CANDIDA PARAPSILOSIS: NOT DETECTED
Candida glabrata: NOT DETECTED
Candida tropicalis: NOT DETECTED
ENTEROBACTER CLOACAE COMPLEX: NOT DETECTED
ENTEROBACTERIACEAE SPECIES: NOT DETECTED
ENTEROCOCCUS SPECIES: NOT DETECTED
ESCHERICHIA COLI: NOT DETECTED
Haemophilus influenzae: NOT DETECTED
KLEBSIELLA OXYTOCA: NOT DETECTED
Klebsiella pneumoniae: NOT DETECTED
Listeria monocytogenes: NOT DETECTED
Methicillin resistance: NOT DETECTED
Neisseria meningitidis: NOT DETECTED
PSEUDOMONAS AERUGINOSA: NOT DETECTED
Proteus species: NOT DETECTED
STREPTOCOCCUS AGALACTIAE: NOT DETECTED
STREPTOCOCCUS PNEUMONIAE: NOT DETECTED
Serratia marcescens: NOT DETECTED
Staphylococcus aureus (BCID): NOT DETECTED
Staphylococcus species: DETECTED — AB
Streptococcus pyogenes: NOT DETECTED
Streptococcus species: NOT DETECTED

## 2017-11-05 LAB — CULTURE, BLOOD (ROUTINE X 2)

## 2017-11-06 ENCOUNTER — Institutional Professional Consult (permissible substitution): Payer: Medicare Other | Admitting: Pulmonary Disease

## 2017-11-07 ENCOUNTER — Telehealth: Payer: Self-pay | Admitting: Medical Oncology

## 2017-11-07 LAB — I-STAT CHEM 8, ED
BUN: 54 mg/dL — AB (ref 8–23)
CHLORIDE: 102 mmol/L (ref 98–111)
CREATININE: 2.3 mg/dL — AB (ref 0.61–1.24)
Calcium, Ion: 1.07 mmol/L — ABNORMAL LOW (ref 1.15–1.40)
GLUCOSE: 139 mg/dL — AB (ref 70–99)
HCT: 31 % — ABNORMAL LOW (ref 39.0–52.0)
Hemoglobin: 10.5 g/dL — ABNORMAL LOW (ref 13.0–17.0)
Potassium: 5.8 mmol/L — ABNORMAL HIGH (ref 3.5–5.1)
Sodium: 134 mmol/L — ABNORMAL LOW (ref 135–145)
TCO2: 15 mmol/L — ABNORMAL LOW (ref 22–32)

## 2017-11-07 NOTE — Telephone Encounter (Signed)
Expressed my condolences to wife from the staff.

## 2017-11-08 LAB — CULTURE, BLOOD (ROUTINE X 2): CULTURE: NO GROWTH

## 2017-11-09 ENCOUNTER — Ambulatory Visit (HOSPITAL_COMMUNITY): Payer: Medicare Other

## 2017-11-13 ENCOUNTER — Other Ambulatory Visit: Payer: Medicare Other

## 2017-11-13 ENCOUNTER — Ambulatory Visit: Payer: Medicare Other | Admitting: Internal Medicine

## 2017-11-13 ENCOUNTER — Ambulatory Visit: Payer: Medicare Other

## 2017-11-15 ENCOUNTER — Ambulatory Visit: Payer: Medicare Other | Admitting: Radiation Oncology

## 2017-11-29 NOTE — Progress Notes (Signed)
   2017-11-15 1900  Clinical Encounter Type  Visited With Patient;Family;Patient and family together;Health care provider  Visit Type Initial;Spiritual support;Psychological support;Social support;ED;Death  Referral From Nurse;Physician  Consult/Referral To Faith community  Spiritual Encounters  Spiritual Needs Emotional;Prayer  Stress Factors  Patient Stress Factors None identified  Family Stress Factors Loss   Chaplain was called to support family as they waiting for news from the doctor. It was then chaplain as made aware that the Pt had coded twice and CPR may not be  successful if Pt coded again. Medical staff asked family if they still wanted Pt to go through life extensive measures if Pt's heart stopped. Family responded with yes. Medical staff continued to work on Pt. At that point Nurse invited family to the trauma area, Chaplain and family's pastor was present. after family saw the chest compressions family made the decisions to least with CPR. Time of death was recorded and chaplain with the family's pastor actively supported family as they said their goodbye's. Chaplain prayed with family.   Chaplain did follow up visits with nursing staff and critical care doctor   Funeral Home: [facility]) Phone to [place] home: [phone]

## 2017-11-29 DEATH — deceased

## 2017-12-04 ENCOUNTER — Ambulatory Visit: Payer: Medicare Other | Admitting: Internal Medicine

## 2017-12-04 ENCOUNTER — Other Ambulatory Visit: Payer: Medicare Other

## 2017-12-04 ENCOUNTER — Ambulatory Visit: Payer: Medicare Other

## 2017-12-25 ENCOUNTER — Ambulatory Visit: Payer: Medicare Other | Admitting: Internal Medicine

## 2017-12-25 ENCOUNTER — Other Ambulatory Visit: Payer: Medicare Other

## 2017-12-25 ENCOUNTER — Ambulatory Visit: Payer: Medicare Other

## 2019-07-02 IMAGING — CR DG CHEST 2V
3 series · 3 of 3 positions shown · non-contrast
Comparison: 05/07/2017

CLINICAL DATA: Cough and fever.  Lung cancer.

EXAM:
CHEST  2 VIEW

[w chest lat]
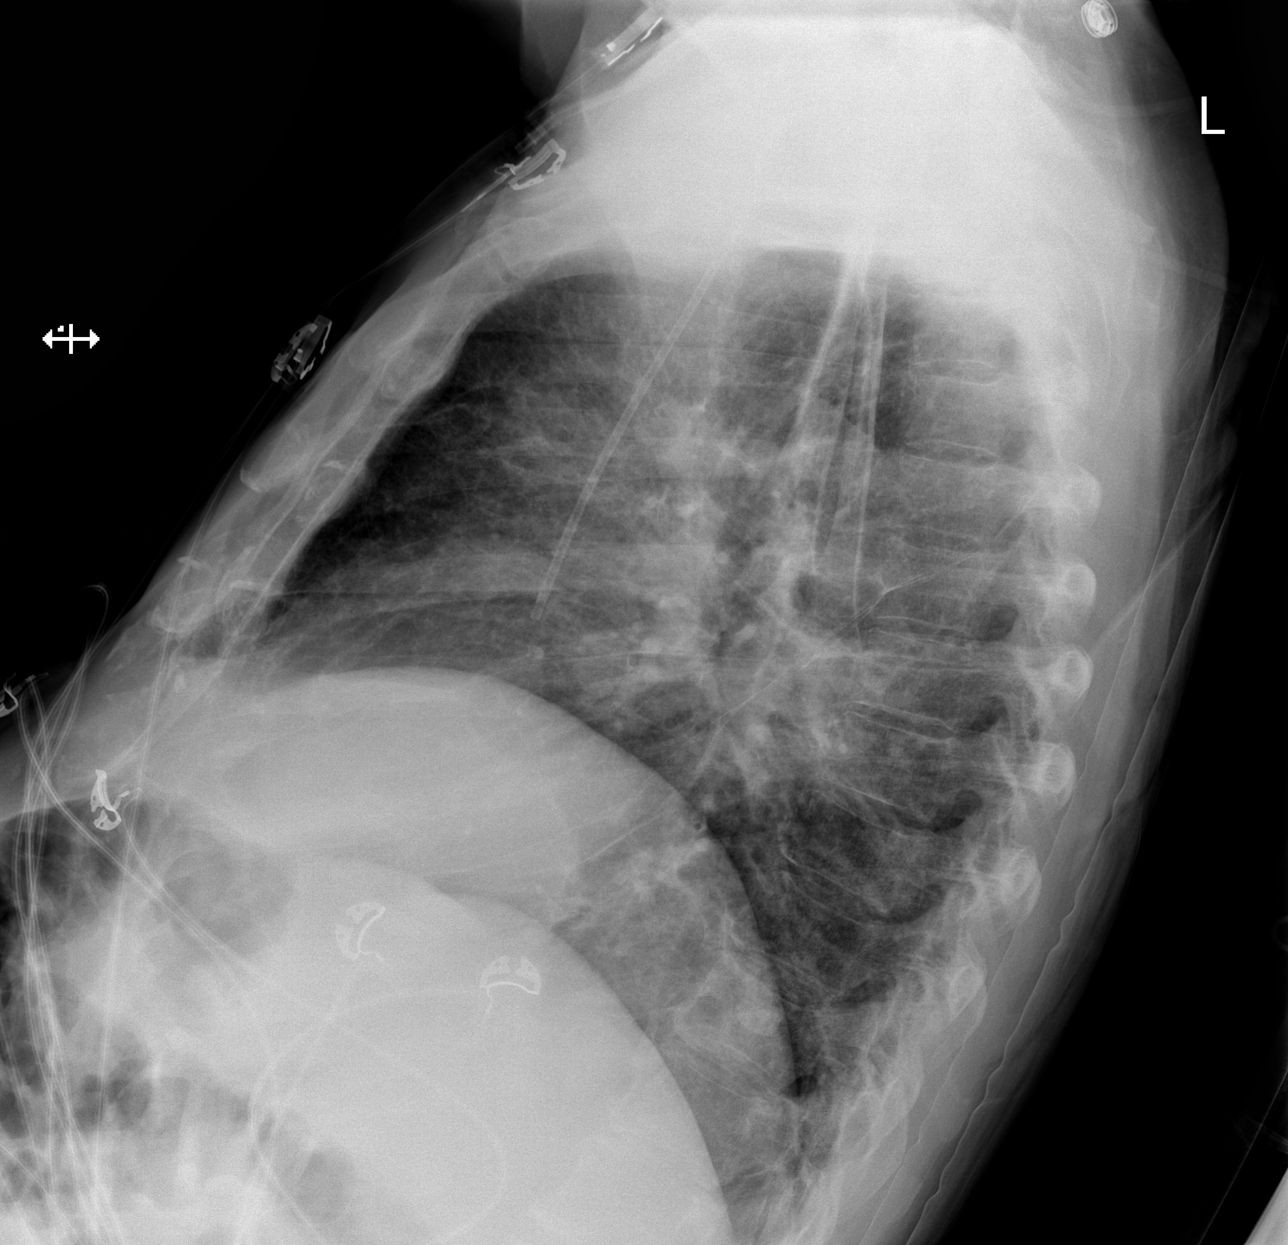

[chest ap (1 of 2)]
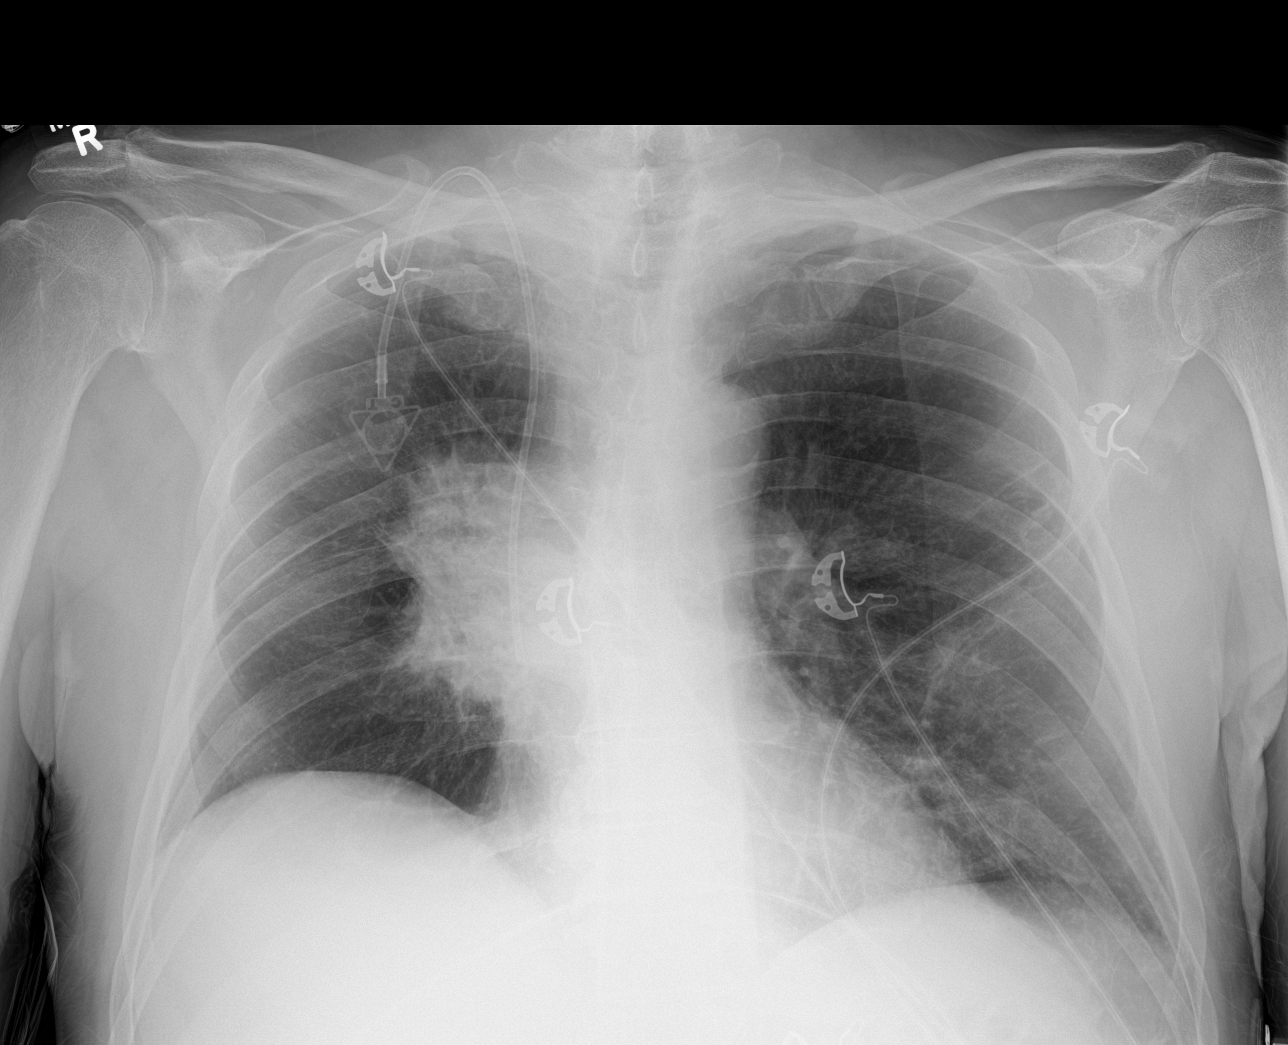

[chest ap (2 of 2)]
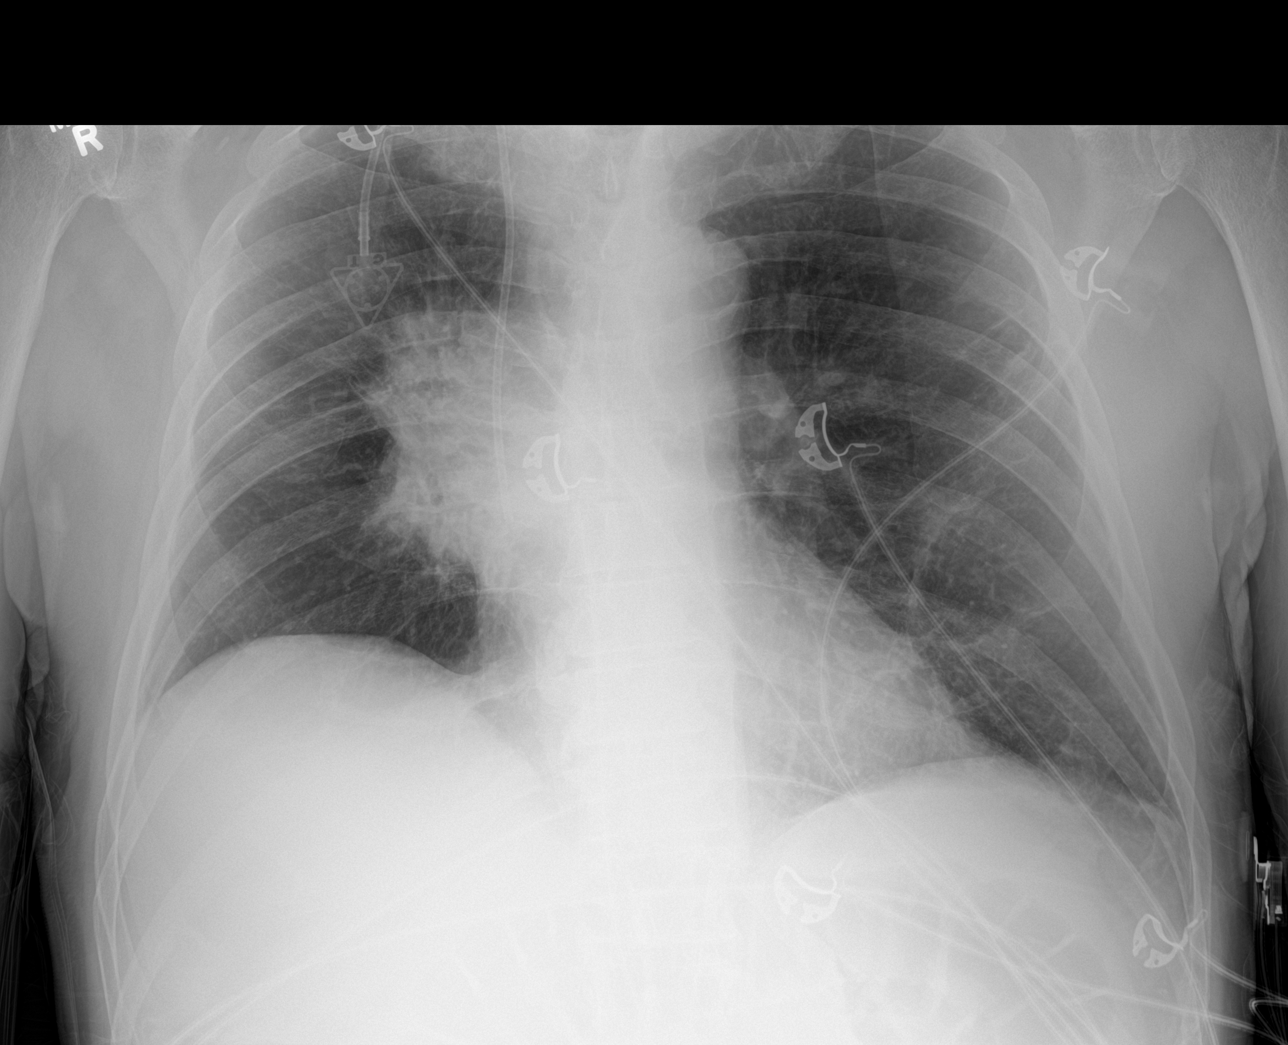

[3 of 3 positions shown; findings below may reference images not displayed]

FINDINGS: Progressive enlargement of the right hilum. Given the time frame,
this is most likely pneumonia or radiation pneumonitis rather than
tumor progression. Left lung remains clear. No heart failure or
effusion. Port-A-Cath tip in the lower SVC unchanged.
IMPRESSION: Progressive enlargement of the right hilum which may be due to
pneumonia or radiation change.

## 2019-07-02 IMAGING — CT CT ANGIO CHEST
2 of 6 series · 17 of 46 positions shown · IV contrast (ISOVUE 370)
Comparison: Chest CT 03/08/2017.

CLINICAL DATA: 76-year-old male with history of metastatic
non-small cell lung cancer on chemotherapy presenting with history
of fever, weakness and shortness of breath for the past 4 weeks.

EXAM:
CT ANGIOGRAPHY CHEST WITH CONTRAST
TECHNIQUE: Multidetector CT imaging of the chest was performed using the
standard protocol during bolus administration of intravenous
contrast. Multiplanar CT image reconstructions and MIPs were
obtained to evaluate the vascular anatomy.
CONTRAST:  100mL ZGFO6G-IS6 IOPAMIDOL (ZGFO6G-IS6) INJECTION 76%

[Series 5: thins · axial · 0.84mm/px · z∈[-326,-52]mm · 14 of 302 slices shown]
[im 14/302  lung]
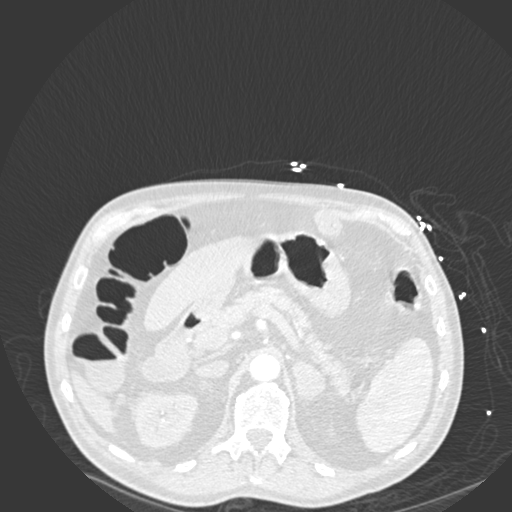
[im 40/302  soft-tissue]
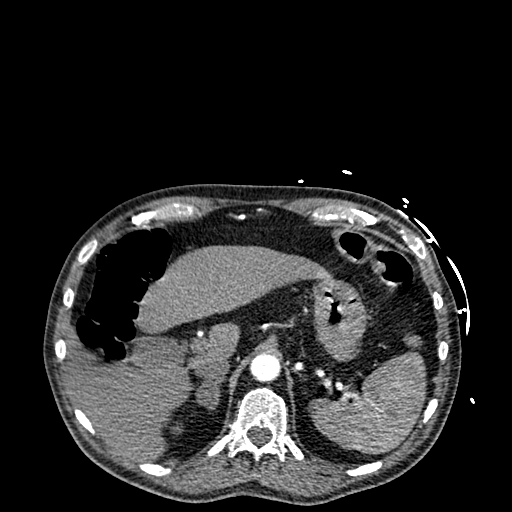
[im 53/302  lung]
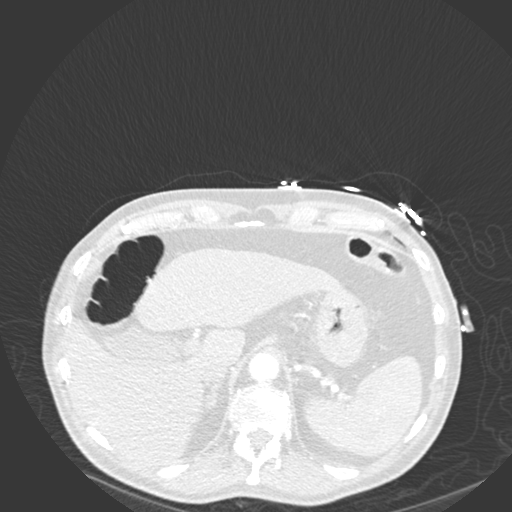
[im 79/302  soft-tissue]
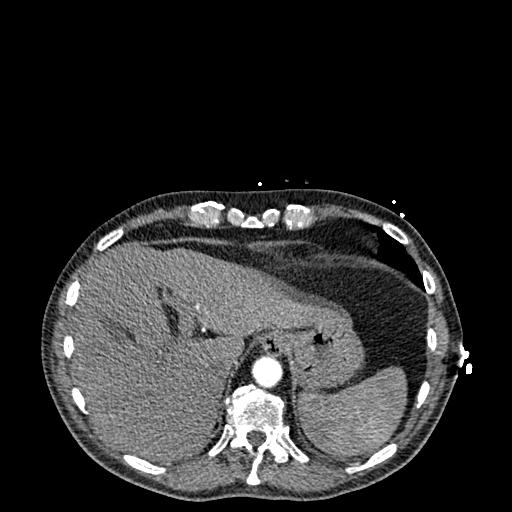
[im 105/302  lung]
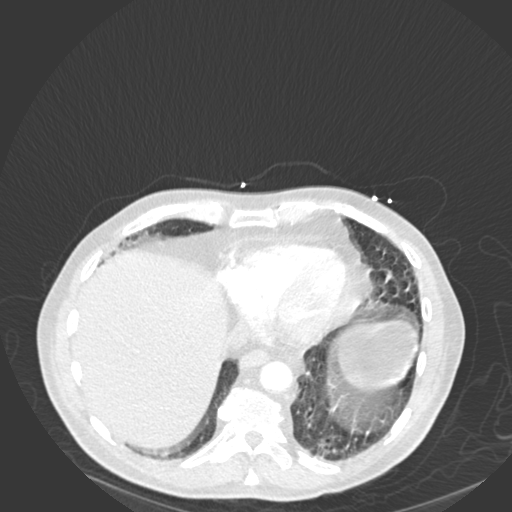
[im 118/302  soft-tissue]
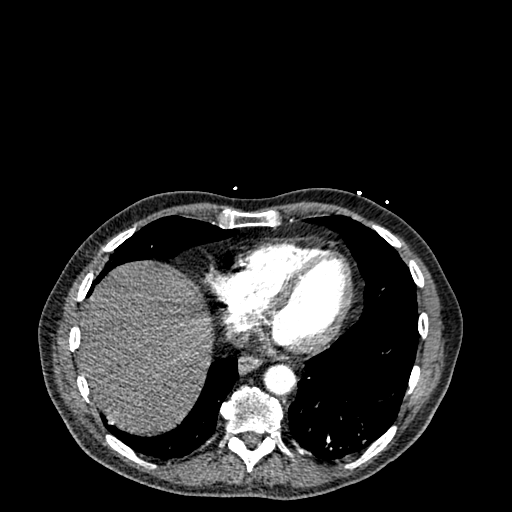
[im 144/302  lung]
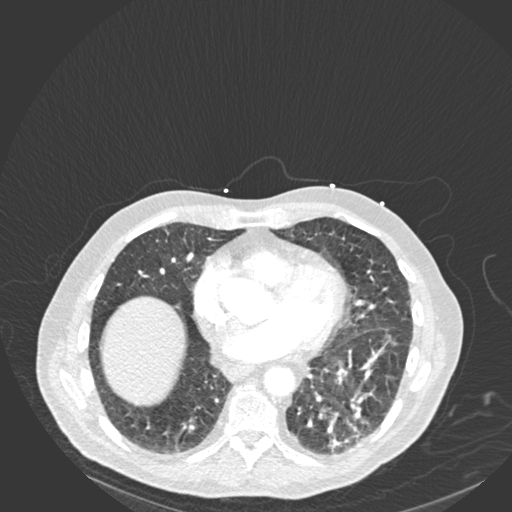
[im 158/302  soft-tissue]
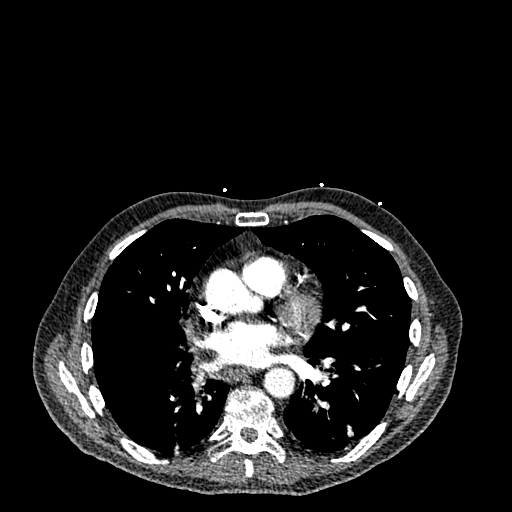
[im 184/302  lung]
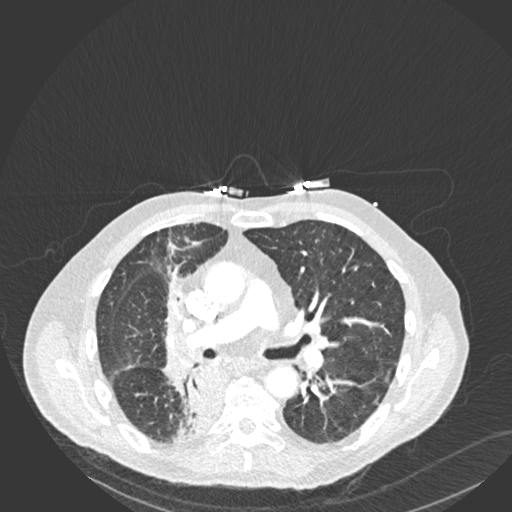
[im 197/302  soft-tissue]
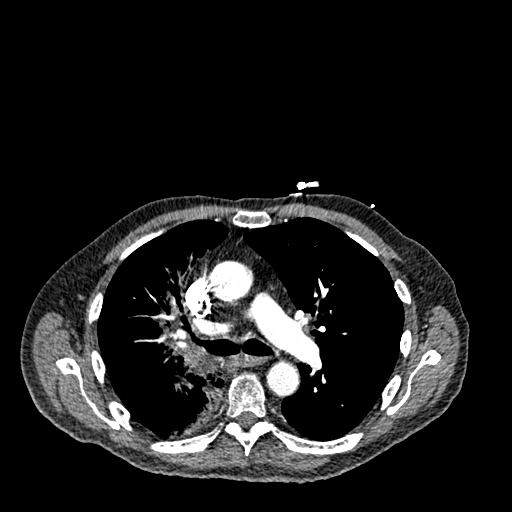
[im 223/302  lung]
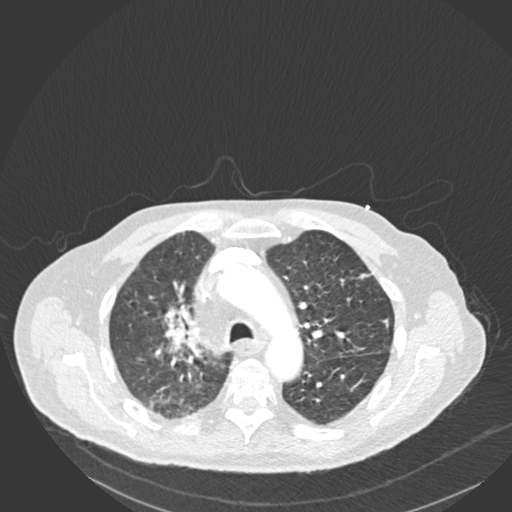
[im 249/302  soft-tissue]
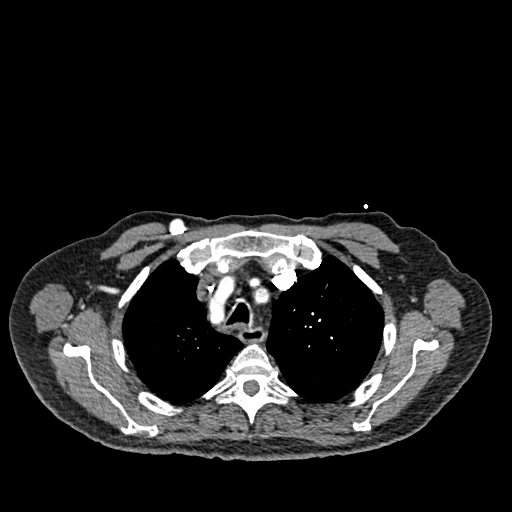
[im 262/302  lung]
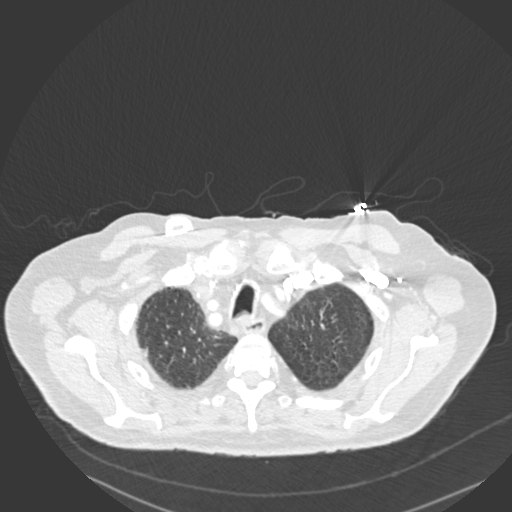
[im 288/302  soft-tissue]
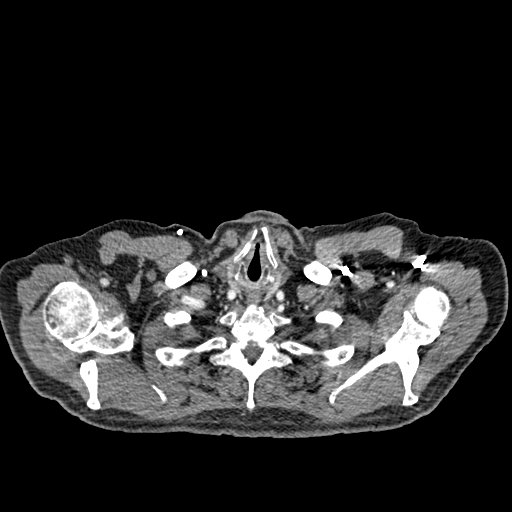

[Series 7: coronal mpr · coronal · 0.62mm/px · 3 of 151 slices shown]
[im 38/151  soft-tissue]
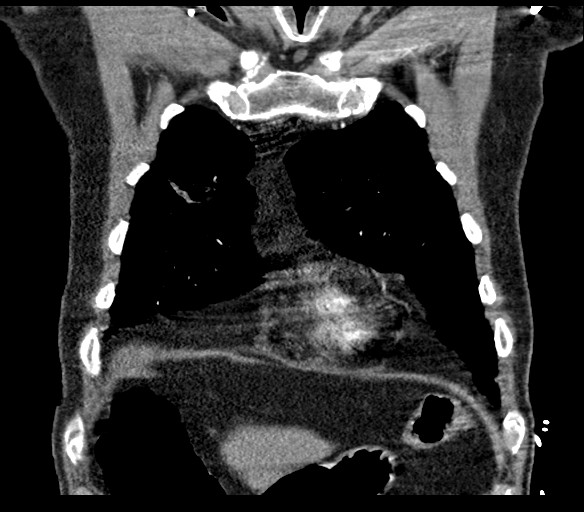
[im 76/151  soft-tissue]
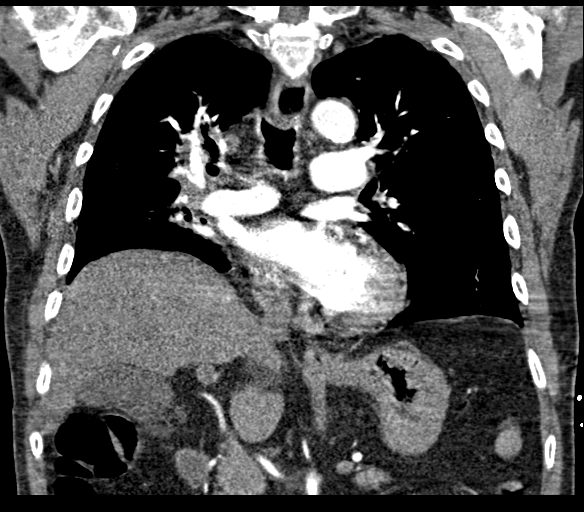
[im 113/151  soft-tissue]
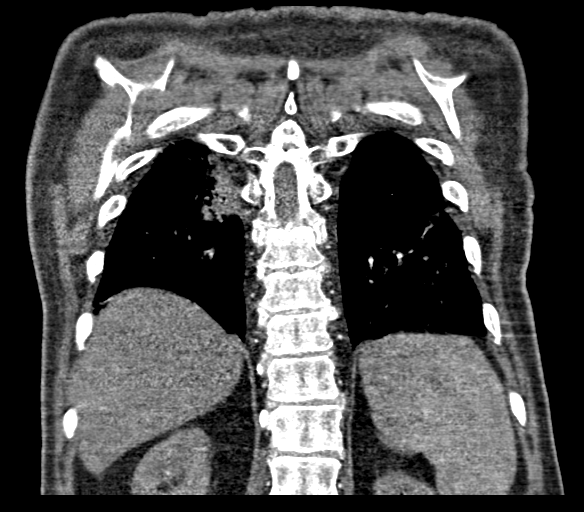

[17 of 46 positions shown; findings below may reference images not displayed]

FINDINGS: Cardiovascular: Today's study is limited by considerable patient
respiratory motion. With this limitation in mind, there is no
central, lobar or proximal segmental sized pulmonary embolism. More
distal segmental or subsegmental sized emboli cannot be entirely
excluded secondary to respiratory motion. Heart size is normal.
There is no significant pericardial fluid, thickening or pericardial
calcification. There is aortic atherosclerosis, as well as
atherosclerosis of the great vessels of the mediastinum and the
coronary arteries, including calcified atherosclerotic plaque in the
left anterior descending coronary artery. Calcification of the
aortic valve. Right internal jugular single-lumen porta cath with
tip terminating at the superior cavoatrial junction.

Mediastinum/Nodes: Extensive soft tissue thickening in the right
hilar region likely related to prior radiation therapy and similar
to the prior examination. No pathologically enlarged mediastinal or
left hilar lymph nodes. Esophagus is unremarkable in appearance. No
axillary lymphadenopathy.

Lungs/Pleura: Increasing mass-like architectural distortion
throughout the medial aspect of the right lung emanating outward
from the hilar region, likely to reflect progressive postradiation
mass-like fibrosis. No definite suspicious appearing pulmonary
nodule or mass to strongly suggest recurrent or metastatic disease
in the lungs. Several nodular areas of architectural distortion in
the periphery of the left lung appear stable compared to prior
examinations, most compatible with areas of post infectious or
inflammatory scarring. No acute consolidative airspace disease. No
pleural effusions. Diffuse bronchial wall thickening with mild
centrilobular and paraseptal emphysema.

Upper Abdomen: Aortic atherosclerosis. 1.8 cm right adrenal nodule
is unchanged. Left adrenal mass has clearly enlarged, currently
measuring 3.3 cm in diameter.

Musculoskeletal: There are no aggressive appearing lytic or blastic
lesions noted in the visualized portions of the skeleton.

Review of the MIP images confirms the above findings.
IMPRESSION: 1. Although today's study is limited by considerable patient
respiratory motion there is no evidence of central, lobar or
proximal segmental sized pulmonary embolism.
2. Evolving chronic postradiation changes of mass-like fibrosis
throughout the medial aspect of the right lung.
3. Slight enlargement of left adrenal metastasis. Right adrenal
metastasis is stable in size.
4. Aortic atherosclerosis, in addition to left anterior descending
coronary artery disease.
5. There are calcifications of the aortic valve and mitral annulus.
Echocardiographic correlation for evaluation of potential valvular
dysfunction may be warranted if clinically indicated.
6. Mild diffuse bronchial thickening with mild centrilobular and
paraseptal emphysema; imaging findings suggestive of underlying
COPD.

Aortic Atherosclerosis (Z06B6-RMZ.Z) and Emphysema (Z06B6-KZT.X).
